# Patient Record
Sex: Female | Born: 1935 | Race: Black or African American | Hispanic: No | Smoking: Never smoker
Health system: Southern US, Community
[De-identification: ages and names within clinical notes are randomized; demographics above are authoritative.]

## PROBLEM LIST (undated history)

## (undated) DIAGNOSIS — R42 Dizziness and giddiness: Secondary | ICD-10-CM

## (undated) DIAGNOSIS — K635 Polyp of colon: Secondary | ICD-10-CM

## (undated) DIAGNOSIS — K219 Gastro-esophageal reflux disease without esophagitis: Secondary | ICD-10-CM

## (undated) DIAGNOSIS — I499 Cardiac arrhythmia, unspecified: Secondary | ICD-10-CM

## (undated) DIAGNOSIS — D509 Iron deficiency anemia, unspecified: Secondary | ICD-10-CM

## (undated) DIAGNOSIS — I639 Cerebral infarction, unspecified: Secondary | ICD-10-CM

## (undated) DIAGNOSIS — D519 Vitamin B12 deficiency anemia, unspecified: Secondary | ICD-10-CM

## (undated) DIAGNOSIS — E119 Type 2 diabetes mellitus without complications: Secondary | ICD-10-CM

## (undated) DIAGNOSIS — M5137 Other intervertebral disc degeneration, lumbosacral region: Secondary | ICD-10-CM

## (undated) DIAGNOSIS — E785 Hyperlipidemia, unspecified: Secondary | ICD-10-CM

## (undated) DIAGNOSIS — IMO0002 Reserved for concepts with insufficient information to code with codable children: Secondary | ICD-10-CM

## (undated) DIAGNOSIS — M51379 Other intervertebral disc degeneration, lumbosacral region without mention of lumbar back pain or lower extremity pain: Secondary | ICD-10-CM

## (undated) DIAGNOSIS — D510 Vitamin B12 deficiency anemia due to intrinsic factor deficiency: Secondary | ICD-10-CM

## (undated) DIAGNOSIS — R569 Unspecified convulsions: Secondary | ICD-10-CM

## (undated) DIAGNOSIS — R609 Edema, unspecified: Secondary | ICD-10-CM

## (undated) DIAGNOSIS — I10 Essential (primary) hypertension: Secondary | ICD-10-CM

## (undated) DIAGNOSIS — J309 Allergic rhinitis, unspecified: Secondary | ICD-10-CM

## (undated) DIAGNOSIS — N951 Menopausal and female climacteric states: Secondary | ICD-10-CM

## (undated) DIAGNOSIS — D126 Benign neoplasm of colon, unspecified: Secondary | ICD-10-CM

## (undated) DIAGNOSIS — M545 Low back pain, unspecified: Secondary | ICD-10-CM

## (undated) DIAGNOSIS — I2510 Atherosclerotic heart disease of native coronary artery without angina pectoris: Secondary | ICD-10-CM

## (undated) DIAGNOSIS — M1990 Unspecified osteoarthritis, unspecified site: Secondary | ICD-10-CM

## (undated) HISTORY — DX: Iron deficiency anemia, unspecified: D50.9

## (undated) HISTORY — PX: CATARACT EXTRACTION, BILATERAL: SHX1313

## (undated) HISTORY — DX: Menopausal and female climacteric states: N95.1

## (undated) HISTORY — DX: Other intervertebral disc degeneration, lumbosacral region: M51.37

## (undated) HISTORY — PX: CHOLECYSTECTOMY: SHX55

## (undated) HISTORY — DX: Edema, unspecified: R60.9

## (undated) HISTORY — DX: Dizziness and giddiness: R42

## (undated) HISTORY — PX: CARDIAC CATHETERIZATION: SHX172

## (undated) HISTORY — DX: Polyp of colon: K63.5

## (undated) HISTORY — DX: Reserved for concepts with insufficient information to code with codable children: IMO0002

## (undated) HISTORY — PX: CORONARY ANGIOPLASTY WITH STENT PLACEMENT: SHX49

## (undated) HISTORY — DX: Low back pain: M54.5

## (undated) HISTORY — DX: Benign neoplasm of colon, unspecified: D12.6

## (undated) HISTORY — DX: Vitamin B12 deficiency anemia, unspecified: D51.9

## (undated) HISTORY — DX: Low back pain, unspecified: M54.50

## (undated) HISTORY — DX: Cardiac arrhythmia, unspecified: I49.9

## (undated) HISTORY — DX: Unspecified convulsions: R56.9

## (undated) HISTORY — DX: Allergic rhinitis, unspecified: J30.9

## (undated) HISTORY — DX: Vitamin B12 deficiency anemia due to intrinsic factor deficiency: D51.0

## (undated) HISTORY — PX: CARPAL TUNNEL RELEASE: SHX101

## (undated) HISTORY — DX: Other intervertebral disc degeneration, lumbosacral region without mention of lumbar back pain or lower extremity pain: M51.379

## (undated) HISTORY — DX: Atherosclerotic heart disease of native coronary artery without angina pectoris: I25.10

## (undated) HISTORY — PX: LUMBAR DISC SURGERY: SHX700

## (undated) HISTORY — DX: Gastro-esophageal reflux disease without esophagitis: K21.9

## (undated) HISTORY — DX: Essential (primary) hypertension: I10

## (undated) HISTORY — DX: Hyperlipidemia, unspecified: E78.5

## (undated) HISTORY — DX: Cerebral infarction, unspecified: I63.9

---

## 1972-11-11 HISTORY — PX: ABDOMINAL HYSTERECTOMY: SHX81

## 1972-11-11 HISTORY — PX: APPENDECTOMY: SHX54

## 1998-02-17 ENCOUNTER — Ambulatory Visit (HOSPITAL_COMMUNITY): Admission: RE | Admit: 1998-02-17 | Discharge: 1998-02-17 | Payer: Self-pay | Admitting: Family Medicine

## 1998-04-12 ENCOUNTER — Observation Stay (HOSPITAL_COMMUNITY): Admission: EM | Admit: 1998-04-12 | Discharge: 1998-04-12 | Payer: Self-pay | Admitting: Emergency Medicine

## 1998-04-27 ENCOUNTER — Ambulatory Visit (HOSPITAL_COMMUNITY): Admission: RE | Admit: 1998-04-27 | Discharge: 1998-04-27 | Payer: Self-pay | Admitting: Family Medicine

## 1998-06-02 ENCOUNTER — Ambulatory Visit (HOSPITAL_COMMUNITY): Admission: RE | Admit: 1998-06-02 | Discharge: 1998-06-02 | Payer: Self-pay | Admitting: Internal Medicine

## 1998-12-20 ENCOUNTER — Other Ambulatory Visit: Admission: RE | Admit: 1998-12-20 | Discharge: 1998-12-20 | Payer: Self-pay | Admitting: Gynecology

## 1999-12-18 ENCOUNTER — Encounter: Payer: Self-pay | Admitting: Gynecology

## 1999-12-18 ENCOUNTER — Encounter: Admission: RE | Admit: 1999-12-18 | Discharge: 1999-12-18 | Payer: Self-pay | Admitting: Gynecology

## 2000-01-11 ENCOUNTER — Other Ambulatory Visit: Admission: RE | Admit: 2000-01-11 | Discharge: 2000-01-11 | Payer: Self-pay | Admitting: Gynecology

## 2000-04-10 ENCOUNTER — Emergency Department (HOSPITAL_COMMUNITY): Admission: EM | Admit: 2000-04-10 | Discharge: 2000-04-10 | Payer: Self-pay | Admitting: Emergency Medicine

## 2001-01-07 ENCOUNTER — Encounter: Admission: RE | Admit: 2001-01-07 | Discharge: 2001-01-07 | Payer: Self-pay | Admitting: Gynecology

## 2001-01-07 ENCOUNTER — Encounter: Payer: Self-pay | Admitting: Gynecology

## 2001-01-22 ENCOUNTER — Other Ambulatory Visit: Admission: RE | Admit: 2001-01-22 | Discharge: 2001-01-22 | Payer: Self-pay | Admitting: Gynecology

## 2001-02-03 ENCOUNTER — Encounter: Admission: RE | Admit: 2001-02-03 | Discharge: 2001-02-03 | Payer: Self-pay | Admitting: Gynecology

## 2001-02-03 ENCOUNTER — Encounter: Payer: Self-pay | Admitting: Gynecology

## 2001-04-30 ENCOUNTER — Encounter: Admission: RE | Admit: 2001-04-30 | Discharge: 2001-04-30 | Payer: Self-pay | Admitting: Family Medicine

## 2001-04-30 ENCOUNTER — Encounter: Payer: Self-pay | Admitting: Family Medicine

## 2001-05-06 ENCOUNTER — Encounter: Payer: Self-pay | Admitting: Family Medicine

## 2001-05-06 ENCOUNTER — Encounter: Admission: RE | Admit: 2001-05-06 | Discharge: 2001-05-06 | Payer: Self-pay | Admitting: Family Medicine

## 2001-06-25 ENCOUNTER — Encounter: Payer: Self-pay | Admitting: Emergency Medicine

## 2001-06-25 ENCOUNTER — Inpatient Hospital Stay (HOSPITAL_COMMUNITY): Admission: EM | Admit: 2001-06-25 | Discharge: 2001-06-26 | Payer: Self-pay | Admitting: Emergency Medicine

## 2001-06-26 ENCOUNTER — Encounter: Payer: Self-pay | Admitting: Emergency Medicine

## 2001-07-30 ENCOUNTER — Encounter (INDEPENDENT_AMBULATORY_CARE_PROVIDER_SITE_OTHER): Payer: Self-pay | Admitting: Specialist

## 2001-07-30 ENCOUNTER — Other Ambulatory Visit: Admission: RE | Admit: 2001-07-30 | Discharge: 2001-07-30 | Payer: Self-pay | Admitting: Internal Medicine

## 2002-01-25 ENCOUNTER — Other Ambulatory Visit: Admission: RE | Admit: 2002-01-25 | Discharge: 2002-01-25 | Payer: Self-pay | Admitting: Gynecology

## 2002-02-04 ENCOUNTER — Encounter: Payer: Self-pay | Admitting: Gynecology

## 2002-02-04 ENCOUNTER — Encounter: Admission: RE | Admit: 2002-02-04 | Discharge: 2002-02-04 | Payer: Self-pay | Admitting: Gynecology

## 2002-03-22 ENCOUNTER — Encounter: Admission: RE | Admit: 2002-03-22 | Discharge: 2002-03-22 | Payer: Self-pay | Admitting: Family Medicine

## 2002-03-22 ENCOUNTER — Encounter: Payer: Self-pay | Admitting: Family Medicine

## 2002-04-15 ENCOUNTER — Ambulatory Visit (HOSPITAL_COMMUNITY): Admission: RE | Admit: 2002-04-15 | Discharge: 2002-04-15 | Payer: Self-pay | Admitting: Gastroenterology

## 2002-05-24 ENCOUNTER — Encounter (INDEPENDENT_AMBULATORY_CARE_PROVIDER_SITE_OTHER): Payer: Self-pay | Admitting: Specialist

## 2002-05-24 ENCOUNTER — Encounter: Payer: Self-pay | Admitting: Surgery

## 2002-05-24 ENCOUNTER — Observation Stay (HOSPITAL_COMMUNITY): Admission: RE | Admit: 2002-05-24 | Discharge: 2002-05-25 | Payer: Self-pay | Admitting: Surgery

## 2002-06-01 ENCOUNTER — Ambulatory Visit (HOSPITAL_COMMUNITY): Admission: RE | Admit: 2002-06-01 | Discharge: 2002-06-01 | Payer: Self-pay | Admitting: Surgery

## 2002-06-01 ENCOUNTER — Encounter: Payer: Self-pay | Admitting: Surgery

## 2003-02-08 ENCOUNTER — Encounter: Payer: Self-pay | Admitting: Gynecology

## 2003-02-08 ENCOUNTER — Encounter: Admission: RE | Admit: 2003-02-08 | Discharge: 2003-02-08 | Payer: Self-pay | Admitting: Gynecology

## 2003-03-07 ENCOUNTER — Encounter: Admission: RE | Admit: 2003-03-07 | Discharge: 2003-03-07 | Payer: Self-pay | Admitting: Family Medicine

## 2003-03-07 ENCOUNTER — Encounter: Payer: Self-pay | Admitting: Family Medicine

## 2003-07-28 ENCOUNTER — Encounter: Payer: Self-pay | Admitting: Surgery

## 2003-07-28 ENCOUNTER — Encounter: Admission: RE | Admit: 2003-07-28 | Discharge: 2003-07-28 | Payer: Self-pay | Admitting: Surgery

## 2003-08-19 ENCOUNTER — Encounter: Payer: Self-pay | Admitting: Surgery

## 2003-08-19 ENCOUNTER — Encounter: Admission: RE | Admit: 2003-08-19 | Discharge: 2003-08-19 | Payer: Self-pay | Admitting: Surgery

## 2003-09-09 ENCOUNTER — Ambulatory Visit (HOSPITAL_COMMUNITY): Admission: RE | Admit: 2003-09-09 | Discharge: 2003-09-09 | Payer: Self-pay | Admitting: Surgery

## 2004-01-03 ENCOUNTER — Emergency Department (HOSPITAL_COMMUNITY): Admission: EM | Admit: 2004-01-03 | Discharge: 2004-01-03 | Payer: Self-pay | Admitting: Family Medicine

## 2004-01-08 ENCOUNTER — Emergency Department (HOSPITAL_COMMUNITY): Admission: AD | Admit: 2004-01-08 | Discharge: 2004-01-08 | Payer: Self-pay | Admitting: Family Medicine

## 2004-01-30 ENCOUNTER — Other Ambulatory Visit: Admission: RE | Admit: 2004-01-30 | Discharge: 2004-01-30 | Payer: Self-pay | Admitting: Gynecology

## 2004-02-16 ENCOUNTER — Encounter: Admission: RE | Admit: 2004-02-16 | Discharge: 2004-02-16 | Payer: Self-pay | Admitting: Gynecology

## 2004-08-27 ENCOUNTER — Ambulatory Visit (HOSPITAL_COMMUNITY): Admission: RE | Admit: 2004-08-27 | Discharge: 2004-08-27 | Payer: Self-pay | Admitting: Endocrinology

## 2004-09-20 ENCOUNTER — Ambulatory Visit: Payer: Self-pay | Admitting: Internal Medicine

## 2004-09-25 ENCOUNTER — Ambulatory Visit: Payer: Self-pay | Admitting: Cardiology

## 2004-10-18 ENCOUNTER — Ambulatory Visit: Payer: Self-pay | Admitting: Internal Medicine

## 2004-10-31 ENCOUNTER — Ambulatory Visit: Payer: Self-pay | Admitting: Endocrinology

## 2004-12-26 ENCOUNTER — Ambulatory Visit: Payer: Self-pay | Admitting: Internal Medicine

## 2005-02-01 ENCOUNTER — Emergency Department (HOSPITAL_COMMUNITY): Admission: EM | Admit: 2005-02-01 | Discharge: 2005-02-01 | Payer: Self-pay | Admitting: Family Medicine

## 2005-02-05 ENCOUNTER — Ambulatory Visit: Payer: Self-pay | Admitting: Internal Medicine

## 2005-02-25 ENCOUNTER — Encounter: Admission: RE | Admit: 2005-02-25 | Discharge: 2005-02-25 | Payer: Self-pay | Admitting: Gynecology

## 2005-05-02 ENCOUNTER — Ambulatory Visit: Payer: Self-pay | Admitting: Internal Medicine

## 2005-05-07 ENCOUNTER — Ambulatory Visit: Payer: Self-pay | Admitting: Endocrinology

## 2005-06-27 ENCOUNTER — Ambulatory Visit: Payer: Self-pay | Admitting: Endocrinology

## 2005-09-03 ENCOUNTER — Ambulatory Visit: Payer: Self-pay | Admitting: Endocrinology

## 2005-09-16 ENCOUNTER — Ambulatory Visit: Payer: Self-pay | Admitting: Internal Medicine

## 2005-09-24 ENCOUNTER — Ambulatory Visit: Payer: Self-pay | Admitting: Endocrinology

## 2005-09-26 ENCOUNTER — Ambulatory Visit: Payer: Self-pay | Admitting: Cardiology

## 2005-10-31 ENCOUNTER — Ambulatory Visit: Payer: Self-pay | Admitting: Internal Medicine

## 2005-11-12 ENCOUNTER — Ambulatory Visit: Payer: Self-pay | Admitting: Endocrinology

## 2006-01-23 ENCOUNTER — Ambulatory Visit: Payer: Self-pay | Admitting: Internal Medicine

## 2006-02-26 ENCOUNTER — Encounter: Admission: RE | Admit: 2006-02-26 | Discharge: 2006-02-26 | Payer: Self-pay | Admitting: Gynecology

## 2006-03-26 ENCOUNTER — Encounter: Admission: RE | Admit: 2006-03-26 | Discharge: 2006-03-26 | Payer: Self-pay | Admitting: Gynecology

## 2006-04-10 ENCOUNTER — Other Ambulatory Visit: Admission: RE | Admit: 2006-04-10 | Discharge: 2006-04-10 | Payer: Self-pay | Admitting: Gynecology

## 2006-05-13 ENCOUNTER — Ambulatory Visit: Payer: Self-pay | Admitting: Internal Medicine

## 2006-05-21 ENCOUNTER — Ambulatory Visit: Payer: Self-pay | Admitting: Endocrinology

## 2006-06-24 ENCOUNTER — Ambulatory Visit: Payer: Self-pay | Admitting: Endocrinology

## 2006-07-03 ENCOUNTER — Ambulatory Visit: Payer: Self-pay | Admitting: Endocrinology

## 2006-07-15 ENCOUNTER — Ambulatory Visit: Payer: Self-pay | Admitting: Endocrinology

## 2006-09-04 ENCOUNTER — Ambulatory Visit: Payer: Self-pay | Admitting: Endocrinology

## 2006-09-04 LAB — CONVERTED CEMR LAB
Chol/HDL Ratio, serum: 3.9
Cholesterol: 221 mg/dL (ref 0–200)
Creatinine,U: 120.7 mg/dL
HCT: 33.9 % — ABNORMAL LOW (ref 36.0–46.0)
HDL: 56.3 mg/dL (ref >39.0)
Hemoglobin: 11.3 g/dL — ABNORMAL LOW (ref 12.0–15.0)
Hgb A1c MFr Bld: 7.1 % — ABNORMAL HIGH (ref 4.6–6.0)
Iron: 76 ug/dL (ref 42–145)
LDL DIRECT: 139.5 mg/dL
MCHC: 33.3 g/dL (ref 30.0–36.0)
MCV: 92.6 fL (ref 78.0–100.0)
Microalb Creat Ratio: 33.1 mg/g — ABNORMAL HIGH (ref 0.0–30.0)
Microalb, Ur: 4 mg/dL — ABNORMAL HIGH (ref 0.0–1.9)
Platelets: 255 10*3/uL (ref 150–400)
RBC: 3.66 M/uL — ABNORMAL LOW (ref 3.87–5.11)
RDW: 13.7 % (ref 11.5–14.6)
Saturation Ratios: 25 % (ref 20.0–50.0)
Transferrin: 217.2 mg/dL (ref >=212.0)
Triglyceride fasting, serum: 80 mg/dL (ref 0–149)
VLDL: 16 mg/dL (ref 0–40)
Vitamin B-12: 234 pg/mL (ref 211–911)
WBC: 4.8 10*3/uL (ref 4.5–10.5)

## 2006-09-09 ENCOUNTER — Ambulatory Visit: Payer: Self-pay | Admitting: Endocrinology

## 2006-09-11 ENCOUNTER — Ambulatory Visit: Payer: Self-pay | Admitting: Cardiology

## 2006-09-12 ENCOUNTER — Ambulatory Visit: Payer: Self-pay | Admitting: Endocrinology

## 2006-10-01 ENCOUNTER — Ambulatory Visit: Payer: Self-pay | Admitting: Internal Medicine

## 2006-10-08 ENCOUNTER — Ambulatory Visit: Payer: Self-pay | Admitting: Endocrinology

## 2006-10-08 ENCOUNTER — Ambulatory Visit: Payer: Self-pay | Admitting: Cardiovascular Disease

## 2006-10-16 ENCOUNTER — Ambulatory Visit: Payer: Self-pay | Admitting: Internal Medicine

## 2006-11-06 ENCOUNTER — Ambulatory Visit: Payer: Self-pay | Admitting: Endocrinology

## 2006-11-06 ENCOUNTER — Ambulatory Visit (HOSPITAL_COMMUNITY): Admission: RE | Admit: 2006-11-06 | Discharge: 2006-11-06 | Payer: Self-pay | Admitting: Endocrinology

## 2007-02-25 ENCOUNTER — Ambulatory Visit: Payer: Self-pay | Admitting: Endocrinology

## 2007-03-03 ENCOUNTER — Encounter: Admission: RE | Admit: 2007-03-03 | Discharge: 2007-03-03 | Payer: Self-pay | Admitting: Gynecology

## 2007-03-31 ENCOUNTER — Ambulatory Visit: Payer: Self-pay | Admitting: Endocrinology

## 2007-03-31 LAB — CONVERTED CEMR LAB: Hgb A1c MFr Bld: 7.5 % — ABNORMAL HIGH (ref 4.6–6.0)

## 2007-04-23 ENCOUNTER — Encounter: Payer: Self-pay | Admitting: Endocrinology

## 2007-04-23 DIAGNOSIS — Z8719 Personal history of other diseases of the digestive system: Secondary | ICD-10-CM | POA: Insufficient documentation

## 2007-04-23 DIAGNOSIS — M129 Arthropathy, unspecified: Secondary | ICD-10-CM | POA: Insufficient documentation

## 2007-04-23 DIAGNOSIS — E119 Type 2 diabetes mellitus without complications: Secondary | ICD-10-CM | POA: Insufficient documentation

## 2007-04-23 DIAGNOSIS — M5137 Other intervertebral disc degeneration, lumbosacral region: Secondary | ICD-10-CM | POA: Insufficient documentation

## 2007-04-23 DIAGNOSIS — K5900 Constipation, unspecified: Secondary | ICD-10-CM | POA: Insufficient documentation

## 2007-04-23 DIAGNOSIS — D126 Benign neoplasm of colon, unspecified: Secondary | ICD-10-CM | POA: Insufficient documentation

## 2007-04-23 DIAGNOSIS — J309 Allergic rhinitis, unspecified: Secondary | ICD-10-CM | POA: Insufficient documentation

## 2007-04-23 DIAGNOSIS — K829 Disease of gallbladder, unspecified: Secondary | ICD-10-CM | POA: Insufficient documentation

## 2007-04-23 DIAGNOSIS — E785 Hyperlipidemia, unspecified: Secondary | ICD-10-CM | POA: Insufficient documentation

## 2007-04-23 DIAGNOSIS — I10 Essential (primary) hypertension: Secondary | ICD-10-CM | POA: Insufficient documentation

## 2007-06-29 LAB — CONVERTED CEMR LAB: Pap Smear: NORMAL

## 2007-08-20 ENCOUNTER — Ambulatory Visit: Payer: Self-pay | Admitting: Endocrinology

## 2007-09-18 ENCOUNTER — Ambulatory Visit: Payer: Self-pay | Admitting: Cardiology

## 2007-10-07 ENCOUNTER — Ambulatory Visit: Payer: Self-pay | Admitting: Endocrinology

## 2007-10-07 DIAGNOSIS — M545 Low back pain, unspecified: Secondary | ICD-10-CM | POA: Insufficient documentation

## 2007-11-06 ENCOUNTER — Ambulatory Visit: Payer: Self-pay | Admitting: Endocrinology

## 2007-11-06 DIAGNOSIS — N951 Menopausal and female climacteric states: Secondary | ICD-10-CM | POA: Insufficient documentation

## 2007-11-07 LAB — CONVERTED CEMR LAB
BUN: 22 mg/dL (ref 6–23)
CO2: 26 meq/L (ref 19–32)
Calcium: 9.6 mg/dL (ref 8.4–10.5)
Chloride: 105 meq/L (ref 96–112)
Creatinine, Ser: 1.1 mg/dL (ref 0.4–1.2)
GFR calc Af Amer: 61 mL/min
GFR calc non Af Amer: 51 mL/min
Glucose, Bld: 120 mg/dL — ABNORMAL HIGH (ref 70–99)
Hgb A1c MFr Bld: 7 % — ABNORMAL HIGH (ref 4.6–6.0)
Potassium: 4.1 meq/L (ref 3.5–5.1)
Sodium: 139 meq/L (ref 135–145)

## 2007-11-09 ENCOUNTER — Encounter: Payer: Self-pay | Admitting: Endocrinology

## 2007-11-09 ENCOUNTER — Ambulatory Visit: Payer: Self-pay | Admitting: Internal Medicine

## 2008-01-28 ENCOUNTER — Ambulatory Visit: Payer: Self-pay

## 2008-01-28 ENCOUNTER — Encounter: Payer: Self-pay | Admitting: Internal Medicine

## 2008-01-28 ENCOUNTER — Ambulatory Visit: Payer: Self-pay | Admitting: Endocrinology

## 2008-01-28 DIAGNOSIS — R609 Edema, unspecified: Secondary | ICD-10-CM | POA: Insufficient documentation

## 2008-01-29 LAB — CONVERTED CEMR LAB: Hgb A1c MFr Bld: 6.8 % — ABNORMAL HIGH (ref 4.6–6.0)

## 2008-02-11 ENCOUNTER — Ambulatory Visit: Payer: Self-pay | Admitting: Endocrinology

## 2008-02-11 DIAGNOSIS — M25569 Pain in unspecified knee: Secondary | ICD-10-CM | POA: Insufficient documentation

## 2008-02-15 LAB — CONVERTED CEMR LAB
Sed Rate: 45 mm/hr — ABNORMAL HIGH (ref 0–22)
Uric Acid, Serum: 6.5 mg/dL (ref 2.4–7.0)

## 2008-04-06 ENCOUNTER — Encounter: Admission: RE | Admit: 2008-04-06 | Discharge: 2008-04-06 | Payer: Self-pay | Admitting: Gynecology

## 2008-04-21 ENCOUNTER — Encounter: Payer: Self-pay | Admitting: Endocrinology

## 2008-06-04 ENCOUNTER — Emergency Department (HOSPITAL_COMMUNITY): Admission: EM | Admit: 2008-06-04 | Discharge: 2008-06-04 | Payer: Self-pay | Admitting: Emergency Medicine

## 2008-08-11 ENCOUNTER — Ambulatory Visit: Payer: Self-pay | Admitting: Endocrinology

## 2008-08-11 DIAGNOSIS — G8929 Other chronic pain: Secondary | ICD-10-CM

## 2008-08-11 DIAGNOSIS — M545 Low back pain, unspecified: Secondary | ICD-10-CM | POA: Insufficient documentation

## 2008-08-15 LAB — CONVERTED CEMR LAB
BUN: 25 mg/dL — ABNORMAL HIGH (ref 6–23)
CO2: 24 meq/L (ref 19–32)
Calcium: 9.4 mg/dL (ref 8.4–10.5)
Chloride: 107 meq/L (ref 96–112)
Cholesterol: 235 mg/dL (ref 0–200)
Creatinine, Ser: 1.1 mg/dL (ref 0.4–1.2)
Direct LDL: 155.3 mg/dL
GFR calc Af Amer: 61 mL/min
GFR calc non Af Amer: 51 mL/min
Glucose, Bld: 105 mg/dL — ABNORMAL HIGH (ref 70–99)
HDL: 61.3 mg/dL (ref >39.0)
Hgb A1c MFr Bld: 7.2 % — ABNORMAL HIGH (ref 4.6–6.0)
Potassium: 4 meq/L (ref 3.5–5.1)
Sodium: 140 meq/L (ref 135–145)
TSH: 2.74 microintl units/mL (ref 0.35–5.50)
Total CHOL/HDL Ratio: 3.8
Triglycerides: 132 mg/dL (ref 0–149)
VLDL: 26 mg/dL (ref 0–40)

## 2008-08-18 ENCOUNTER — Ambulatory Visit: Payer: Self-pay | Admitting: Endocrinology

## 2008-09-02 ENCOUNTER — Ambulatory Visit: Payer: Self-pay | Admitting: Cardiology

## 2008-11-10 ENCOUNTER — Ambulatory Visit: Payer: Self-pay | Admitting: Endocrinology

## 2008-11-10 LAB — CONVERTED CEMR LAB
ALT: 13 units/L (ref 0–35)
AST: 20 units/L (ref 0–37)
Albumin: 3.8 g/dL (ref 3.5–5.2)
Alkaline Phosphatase: 58 units/L (ref 39–117)
BUN: 17 mg/dL (ref 6–23)
Bilirubin, Direct: 0.2 mg/dL (ref 0.0–0.3)
CO2: 27 meq/L (ref 19–32)
Calcium: 9.5 mg/dL (ref 8.4–10.5)
Chloride: 104 meq/L (ref 96–112)
Cholesterol: 138 mg/dL (ref 0–200)
Creatinine, Ser: 0.9 mg/dL (ref 0.4–1.2)
GFR calc Af Amer: 77 mL/min
GFR calc non Af Amer: 64 mL/min
Glucose, Bld: 130 mg/dL — ABNORMAL HIGH (ref 70–99)
HDL: 61.5 mg/dL (ref >39.0)
Hgb A1c MFr Bld: 6.9 % — ABNORMAL HIGH (ref 4.6–6.0)
LDL Cholesterol: 63 mg/dL (ref 0–99)
Potassium: 4.2 meq/L (ref 3.5–5.1)
Sodium: 138 meq/L (ref 135–145)
Total Bilirubin: 0.7 mg/dL (ref 0.3–1.2)
Total CHOL/HDL Ratio: 2.2
Total Protein: 6.7 g/dL (ref 6.0–8.3)
Triglycerides: 69 mg/dL (ref 0–149)
VLDL: 14 mg/dL (ref 0–40)

## 2008-11-16 ENCOUNTER — Ambulatory Visit: Payer: Self-pay | Admitting: Endocrinology

## 2008-11-16 DIAGNOSIS — N3000 Acute cystitis without hematuria: Secondary | ICD-10-CM | POA: Insufficient documentation

## 2008-11-16 LAB — CONVERTED CEMR LAB
Bilirubin Urine: NEGATIVE
Glucose, Urine, Semiquant: NEGATIVE
Ketones, urine, test strip: NEGATIVE
Nitrite: NEGATIVE
Protein, U semiquant: NEGATIVE
Specific Gravity, Urine: <1.005
Urobilinogen, UA: 0.2
pH: 5

## 2008-12-13 ENCOUNTER — Ambulatory Visit: Payer: Self-pay | Admitting: Endocrinology

## 2008-12-13 DIAGNOSIS — R05 Cough: Secondary | ICD-10-CM

## 2008-12-13 DIAGNOSIS — R059 Cough, unspecified: Secondary | ICD-10-CM | POA: Insufficient documentation

## 2008-12-15 ENCOUNTER — Telehealth (INDEPENDENT_AMBULATORY_CARE_PROVIDER_SITE_OTHER): Payer: Self-pay | Admitting: *Deleted

## 2008-12-19 ENCOUNTER — Ambulatory Visit: Payer: Self-pay | Admitting: Endocrinology

## 2008-12-19 ENCOUNTER — Ambulatory Visit: Payer: Self-pay

## 2008-12-19 DIAGNOSIS — M79609 Pain in unspecified limb: Secondary | ICD-10-CM | POA: Insufficient documentation

## 2008-12-26 ENCOUNTER — Telehealth: Payer: Self-pay | Admitting: Endocrinology

## 2009-01-24 ENCOUNTER — Ambulatory Visit: Payer: Self-pay | Admitting: Vascular Surgery

## 2009-02-07 DIAGNOSIS — R002 Palpitations: Secondary | ICD-10-CM | POA: Insufficient documentation

## 2009-02-22 ENCOUNTER — Ambulatory Visit: Payer: Self-pay | Admitting: Vascular Surgery

## 2009-03-04 ENCOUNTER — Ambulatory Visit: Payer: Self-pay | Admitting: Family Medicine

## 2009-04-07 ENCOUNTER — Encounter: Admission: RE | Admit: 2009-04-07 | Discharge: 2009-04-07 | Payer: Self-pay | Admitting: Gynecology

## 2009-04-12 ENCOUNTER — Telehealth: Payer: Self-pay | Admitting: Cardiology

## 2009-04-12 ENCOUNTER — Emergency Department (HOSPITAL_COMMUNITY): Admission: EM | Admit: 2009-04-12 | Discharge: 2009-04-12 | Payer: Self-pay | Admitting: Emergency Medicine

## 2009-04-26 ENCOUNTER — Encounter: Payer: Self-pay | Admitting: Endocrinology

## 2009-05-04 ENCOUNTER — Encounter: Payer: Self-pay | Admitting: Endocrinology

## 2009-05-17 ENCOUNTER — Ambulatory Visit: Payer: Self-pay | Admitting: Endocrinology

## 2009-05-17 DIAGNOSIS — D510 Vitamin B12 deficiency anemia due to intrinsic factor deficiency: Secondary | ICD-10-CM | POA: Insufficient documentation

## 2009-05-17 LAB — CONVERTED CEMR LAB
Basophils Absolute: 0 10*3/uL (ref 0.0–0.1)
Basophils Relative: 0.7 % (ref 0.0–3.0)
Eosinophils Absolute: 0.3 10*3/uL (ref 0.0–0.7)
Eosinophils Relative: 6.5 % — ABNORMAL HIGH (ref 0.0–5.0)
Folate: 10.2 ng/mL
HCT: 30.7 % — ABNORMAL LOW (ref 36.0–46.0)
Hemoglobin: 10.1 g/dL — ABNORMAL LOW (ref 12.0–15.0)
Hgb A1c MFr Bld: 6.6 % — ABNORMAL HIGH (ref 4.6–6.5)
Iron: 50 ug/dL (ref 42–145)
Lymphocytes Relative: 44.3 % (ref 12.0–46.0)
Lymphs Abs: 1.9 10*3/uL (ref 0.7–4.0)
MCHC: 32.9 g/dL (ref 30.0–36.0)
MCV: 91.2 fL (ref 78.0–100.0)
Monocytes Absolute: 0.5 10*3/uL (ref 0.1–1.0)
Monocytes Relative: 12.2 % — ABNORMAL HIGH (ref 3.0–12.0)
Neutro Abs: 1.5 10*3/uL (ref 1.4–7.7)
Neutrophils Relative %: 36.3 % — ABNORMAL LOW (ref 43.0–77.0)
Platelets: 207 10*3/uL (ref 150.0–400.0)
RBC: 3.36 M/uL — ABNORMAL LOW (ref 3.87–5.11)
RDW: 13.3 % (ref 11.5–14.6)
Saturation Ratios: 15.4 % — ABNORMAL LOW (ref 20.0–50.0)
Transferrin: 232.5 mg/dL (ref 212.0–360.0)
Vitamin B-12: 108 pg/mL — ABNORMAL LOW (ref 211–911)
WBC: 4.2 10*3/uL — ABNORMAL LOW (ref 4.5–10.5)

## 2009-05-25 ENCOUNTER — Encounter: Payer: Self-pay | Admitting: Endocrinology

## 2009-05-25 ENCOUNTER — Telehealth (INDEPENDENT_AMBULATORY_CARE_PROVIDER_SITE_OTHER): Payer: Self-pay | Admitting: *Deleted

## 2009-06-07 ENCOUNTER — Ambulatory Visit: Payer: Self-pay | Admitting: Endocrinology

## 2009-06-07 ENCOUNTER — Telehealth: Payer: Self-pay | Admitting: Endocrinology

## 2009-06-22 ENCOUNTER — Encounter (INDEPENDENT_AMBULATORY_CARE_PROVIDER_SITE_OTHER): Payer: Self-pay | Admitting: *Deleted

## 2009-07-05 ENCOUNTER — Ambulatory Visit: Payer: Self-pay | Admitting: Endocrinology

## 2009-08-08 ENCOUNTER — Ambulatory Visit: Payer: Self-pay | Admitting: Endocrinology

## 2009-08-21 ENCOUNTER — Ambulatory Visit: Payer: Self-pay | Admitting: Endocrinology

## 2009-08-30 ENCOUNTER — Ambulatory Visit: Payer: Self-pay | Admitting: Cardiology

## 2009-09-12 ENCOUNTER — Ambulatory Visit: Payer: Self-pay | Admitting: Endocrinology

## 2009-09-13 DIAGNOSIS — R102 Pelvic and perineal pain: Secondary | ICD-10-CM | POA: Insufficient documentation

## 2009-09-13 LAB — CONVERTED CEMR LAB
ALT: 15 units/L (ref 0–35)
AST: 26 units/L (ref 0–37)
Albumin: 4.1 g/dL (ref 3.5–5.2)
Alkaline Phosphatase: 57 units/L (ref 39–117)
Amylase: 153 units/L — ABNORMAL HIGH (ref 27–131)
BUN: 19 mg/dL (ref 6–23)
Basophils Absolute: 0 10*3/uL (ref 0.0–0.1)
Basophils Relative: 0.6 % (ref 0.0–3.0)
Bilirubin, Direct: 0.1 mg/dL (ref 0.0–0.3)
CO2: 23 meq/L (ref 19–32)
Calcium: 9.2 mg/dL (ref 8.4–10.5)
Chloride: 100 meq/L (ref 96–112)
Creatinine, Ser: 1.1 mg/dL (ref 0.4–1.2)
Eosinophils Absolute: 0.3 10*3/uL (ref 0.0–0.7)
Eosinophils Relative: 7.2 % — ABNORMAL HIGH (ref 0.0–5.0)
GFR calc non Af Amer: 61.04 mL/min (ref >60)
Glucose, Bld: 135 mg/dL — ABNORMAL HIGH (ref 70–99)
HCT: 29.9 % — ABNORMAL LOW (ref 36.0–46.0)
Hemoglobin: 10.3 g/dL — ABNORMAL LOW (ref 12.0–15.0)
Hgb A1c MFr Bld: 6.9 % — ABNORMAL HIGH (ref 4.6–6.5)
Lymphocytes Relative: 38.5 % (ref 12.0–46.0)
Lymphs Abs: 1.8 10*3/uL (ref 0.7–4.0)
MCHC: 34.5 g/dL (ref 30.0–36.0)
MCV: 91.9 fL (ref 78.0–100.0)
Monocytes Absolute: 0.5 10*3/uL (ref 0.1–1.0)
Monocytes Relative: 11 % (ref 3.0–12.0)
Neutro Abs: 2.2 10*3/uL (ref 1.4–7.7)
Neutrophils Relative %: 42.7 % — ABNORMAL LOW (ref 43.0–77.0)
Platelets: 213 10*3/uL (ref 150.0–400.0)
Potassium: 4.2 meq/L (ref 3.5–5.1)
RBC: 3.25 M/uL — ABNORMAL LOW (ref 3.87–5.11)
RDW: 13 % (ref 11.5–14.6)
Sodium: 137 meq/L (ref 135–145)
Total Bilirubin: 0.4 mg/dL (ref 0.3–1.2)
Total Protein: 6.8 g/dL (ref 6.0–8.3)
WBC: 4.8 10*3/uL (ref 4.5–10.5)

## 2009-09-14 ENCOUNTER — Ambulatory Visit: Payer: Self-pay | Admitting: Internal Medicine

## 2009-09-18 ENCOUNTER — Ambulatory Visit: Payer: Self-pay | Admitting: Endocrinology

## 2009-09-19 ENCOUNTER — Telehealth (INDEPENDENT_AMBULATORY_CARE_PROVIDER_SITE_OTHER): Payer: Self-pay | Admitting: *Deleted

## 2009-09-22 ENCOUNTER — Telehealth: Payer: Self-pay | Admitting: Endocrinology

## 2009-12-28 ENCOUNTER — Telehealth: Payer: Self-pay | Admitting: Endocrinology

## 2009-12-28 ENCOUNTER — Ambulatory Visit: Payer: Self-pay | Admitting: Endocrinology

## 2009-12-28 DIAGNOSIS — D509 Iron deficiency anemia, unspecified: Secondary | ICD-10-CM | POA: Insufficient documentation

## 2009-12-28 DIAGNOSIS — R42 Dizziness and giddiness: Secondary | ICD-10-CM | POA: Insufficient documentation

## 2009-12-28 LAB — CONVERTED CEMR LAB
BUN: 15 mg/dL (ref 6–23)
Basophils Absolute: 0 10*3/uL (ref 0.0–0.1)
Basophils Relative: 0.4 % (ref 0.0–3.0)
CO2: 29 meq/L (ref 19–32)
Calcium: 9.2 mg/dL (ref 8.4–10.5)
Chloride: 105 meq/L (ref 96–112)
Creatinine, Ser: 1 mg/dL (ref 0.4–1.2)
Eosinophils Absolute: 0.3 10*3/uL (ref 0.0–0.7)
Eosinophils Relative: 6.1 % — ABNORMAL HIGH (ref 0.0–5.0)
GFR calc non Af Amer: 68.08 mL/min (ref >60)
Glucose, Bld: 158 mg/dL — ABNORMAL HIGH (ref 70–99)
HCT: 31.6 % — ABNORMAL LOW (ref 36.0–46.0)
Hemoglobin: 10.2 g/dL — ABNORMAL LOW (ref 12.0–15.0)
Hgb A1c MFr Bld: 6.9 % — ABNORMAL HIGH (ref 4.6–6.5)
Iron: 29 ug/dL — ABNORMAL LOW (ref 42–145)
Lymphocytes Relative: 44.3 % (ref 12.0–46.0)
Lymphs Abs: 1.9 10*3/uL (ref 0.7–4.0)
MCHC: 32.4 g/dL (ref 30.0–36.0)
MCV: 93.2 fL (ref 78.0–100.0)
Monocytes Absolute: 0.5 10*3/uL (ref 0.1–1.0)
Monocytes Relative: 12.7 % — ABNORMAL HIGH (ref 3.0–12.0)
Neutro Abs: 1.6 10*3/uL (ref 1.4–7.7)
Neutrophils Relative %: 36.5 % — ABNORMAL LOW (ref 43.0–77.0)
Platelets: 210 10*3/uL (ref 150.0–400.0)
Potassium: 4.2 meq/L (ref 3.5–5.1)
RBC: 3.4 M/uL — ABNORMAL LOW (ref 3.87–5.11)
RDW: 13 % (ref 11.5–14.6)
Saturation Ratios: 8.5 % — ABNORMAL LOW (ref 20.0–50.0)
Sodium: 140 meq/L (ref 135–145)
Transferrin: 243.3 mg/dL (ref 212.0–360.0)
WBC: 4.3 10*3/uL — ABNORMAL LOW (ref 4.5–10.5)

## 2009-12-29 ENCOUNTER — Telehealth: Payer: Self-pay | Admitting: Internal Medicine

## 2010-01-01 ENCOUNTER — Ambulatory Visit: Payer: Self-pay | Admitting: Internal Medicine

## 2010-01-01 DIAGNOSIS — D518 Other vitamin B12 deficiency anemias: Secondary | ICD-10-CM | POA: Insufficient documentation

## 2010-01-01 DIAGNOSIS — Z860100 Personal history of colon polyps, unspecified: Secondary | ICD-10-CM | POA: Insufficient documentation

## 2010-01-01 DIAGNOSIS — M1990 Unspecified osteoarthritis, unspecified site: Secondary | ICD-10-CM | POA: Insufficient documentation

## 2010-01-10 ENCOUNTER — Ambulatory Visit: Payer: Self-pay | Admitting: Internal Medicine

## 2010-01-10 LAB — CONVERTED CEMR LAB
Basophils Absolute: 0 10*3/uL (ref 0.0–0.1)
Basophils Relative: 0.7 % (ref 0.0–3.0)
Eosinophils Absolute: 0.2 10*3/uL (ref 0.0–0.7)
Eosinophils Relative: 4.7 % (ref 0.0–5.0)
HCT: 31.2 % — ABNORMAL LOW (ref 36.0–46.0)
Hemoglobin: 10.1 g/dL — ABNORMAL LOW (ref 12.0–15.0)
Lymphocytes Relative: 32.4 % (ref 12.0–46.0)
Lymphs Abs: 1.1 10*3/uL (ref 0.7–4.0)
MCHC: 32.3 g/dL (ref 30.0–36.0)
MCV: 93.1 fL (ref 78.0–100.0)
Monocytes Absolute: 0.3 10*3/uL (ref 0.1–1.0)
Monocytes Relative: 9.6 % (ref 3.0–12.0)
Neutro Abs: 1.8 10*3/uL (ref 1.4–7.7)
Neutrophils Relative %: 52.6 % (ref 43.0–77.0)
Platelets: 195 10*3/uL (ref 150.0–400.0)
RBC: 3.35 M/uL — ABNORMAL LOW (ref 3.87–5.11)
RDW: 13.1 % (ref 11.5–14.6)
WBC: 3.4 10*3/uL — ABNORMAL LOW (ref 4.5–10.5)

## 2010-01-13 ENCOUNTER — Encounter: Payer: Self-pay | Admitting: Internal Medicine

## 2010-01-24 ENCOUNTER — Encounter: Payer: Self-pay | Admitting: Endocrinology

## 2010-01-24 ENCOUNTER — Ambulatory Visit: Payer: Self-pay | Admitting: Family Medicine

## 2010-01-31 ENCOUNTER — Ambulatory Visit: Payer: Self-pay | Admitting: Cardiology

## 2010-01-31 ENCOUNTER — Inpatient Hospital Stay (HOSPITAL_COMMUNITY): Admission: EM | Admit: 2010-01-31 | Discharge: 2010-02-03 | Payer: Self-pay | Admitting: Emergency Medicine

## 2010-02-01 ENCOUNTER — Encounter: Payer: Self-pay | Admitting: Cardiology

## 2010-02-02 ENCOUNTER — Encounter: Payer: Self-pay | Admitting: Internal Medicine

## 2010-02-13 ENCOUNTER — Encounter: Payer: Self-pay | Admitting: Cardiology

## 2010-02-14 DIAGNOSIS — K219 Gastro-esophageal reflux disease without esophagitis: Secondary | ICD-10-CM | POA: Insufficient documentation

## 2010-02-14 DIAGNOSIS — I4891 Unspecified atrial fibrillation: Secondary | ICD-10-CM | POA: Insufficient documentation

## 2010-02-14 DIAGNOSIS — I2510 Atherosclerotic heart disease of native coronary artery without angina pectoris: Secondary | ICD-10-CM | POA: Insufficient documentation

## 2010-02-16 ENCOUNTER — Ambulatory Visit: Payer: Self-pay | Admitting: Cardiology

## 2010-02-18 ENCOUNTER — Inpatient Hospital Stay (HOSPITAL_COMMUNITY): Admission: EM | Admit: 2010-02-18 | Discharge: 2010-02-20 | Payer: Self-pay | Admitting: Emergency Medicine

## 2010-02-18 ENCOUNTER — Ambulatory Visit: Payer: Self-pay | Admitting: Internal Medicine

## 2010-02-23 ENCOUNTER — Ambulatory Visit: Payer: Self-pay | Admitting: Internal Medicine

## 2010-02-26 LAB — CONVERTED CEMR LAB
Basophils Absolute: 0 10*3/uL (ref 0.0–0.1)
Basophils Relative: 0.6 % (ref 0.0–3.0)
Eosinophils Absolute: 0.3 10*3/uL (ref 0.0–0.7)
Eosinophils Relative: 6.5 % — ABNORMAL HIGH (ref 0.0–5.0)
HCT: 27.1 % — ABNORMAL LOW (ref 36.0–46.0)
Hemoglobin: 9.2 g/dL — ABNORMAL LOW (ref 12.0–15.0)
Lymphocytes Relative: 33.3 % (ref 12.0–46.0)
Lymphs Abs: 1.3 10*3/uL (ref 0.7–4.0)
MCHC: 34.1 g/dL (ref 30.0–36.0)
MCV: 91 fL (ref 78.0–100.0)
Monocytes Absolute: 0.5 10*3/uL (ref 0.1–1.0)
Monocytes Relative: 13.5 % — ABNORMAL HIGH (ref 3.0–12.0)
Neutro Abs: 1.9 10*3/uL (ref 1.4–7.7)
Neutrophils Relative %: 46.1 % (ref 43.0–77.0)
Platelets: 187 10*3/uL (ref 150.0–400.0)
RBC: 2.98 M/uL — ABNORMAL LOW (ref 3.87–5.11)
RDW: 14.9 % — ABNORMAL HIGH (ref 11.5–14.6)
WBC: 4.1 10*3/uL — ABNORMAL LOW (ref 4.5–10.5)

## 2010-03-06 ENCOUNTER — Ambulatory Visit: Payer: Self-pay | Admitting: Cardiology

## 2010-03-23 ENCOUNTER — Observation Stay (HOSPITAL_COMMUNITY): Admission: EM | Admit: 2010-03-23 | Discharge: 2010-03-25 | Payer: Self-pay | Admitting: Emergency Medicine

## 2010-03-24 ENCOUNTER — Encounter (INDEPENDENT_AMBULATORY_CARE_PROVIDER_SITE_OTHER): Payer: Self-pay | Admitting: Internal Medicine

## 2010-03-24 ENCOUNTER — Ambulatory Visit: Payer: Self-pay | Admitting: Surgery

## 2010-03-29 ENCOUNTER — Ambulatory Visit: Payer: Self-pay | Admitting: Endocrinology

## 2010-03-29 ENCOUNTER — Telehealth (INDEPENDENT_AMBULATORY_CARE_PROVIDER_SITE_OTHER): Payer: Self-pay | Admitting: *Deleted

## 2010-03-29 DIAGNOSIS — R55 Syncope and collapse: Secondary | ICD-10-CM | POA: Insufficient documentation

## 2010-03-29 LAB — CONVERTED CEMR LAB
Basophils Absolute: 0 10*3/uL (ref 0.0–0.1)
Basophils Relative: 0.4 % (ref 0.0–3.0)
Eosinophils Absolute: 0.3 10*3/uL (ref 0.0–0.7)
Eosinophils Relative: 5.4 % — ABNORMAL HIGH (ref 0.0–5.0)
HCT: 29.8 % — ABNORMAL LOW (ref 36.0–46.0)
Hemoglobin: 10.1 g/dL — ABNORMAL LOW (ref 12.0–15.0)
Hgb A1c MFr Bld: 6.5 % (ref 4.6–6.5)
Iron: 32 ug/dL — ABNORMAL LOW (ref 42–145)
Lymphocytes Relative: 38.3 % (ref 12.0–46.0)
Lymphs Abs: 1.8 10*3/uL (ref 0.7–4.0)
MCHC: 33.8 g/dL (ref 30.0–36.0)
MCV: 89.6 fL (ref 78.0–100.0)
Monocytes Absolute: 0.6 10*3/uL (ref 0.1–1.0)
Monocytes Relative: 12.4 % — ABNORMAL HIGH (ref 3.0–12.0)
Neutro Abs: 2.1 10*3/uL (ref 1.4–7.7)
Neutrophils Relative %: 43.5 % (ref 43.0–77.0)
Platelets: 232 10*3/uL (ref 150.0–400.0)
RBC: 3.32 M/uL — ABNORMAL LOW (ref 3.87–5.11)
RDW: 14.5 % (ref 11.5–14.6)
Saturation Ratios: 7.9 % — ABNORMAL LOW (ref 20.0–50.0)
Transferrin: 290.9 mg/dL (ref 212.0–360.0)
WBC: 4.7 10*3/uL (ref 4.5–10.5)

## 2010-04-05 ENCOUNTER — Ambulatory Visit: Payer: Self-pay | Admitting: Cardiology

## 2010-04-10 ENCOUNTER — Encounter: Admission: RE | Admit: 2010-04-10 | Discharge: 2010-04-10 | Payer: Self-pay | Admitting: Gynecology

## 2010-04-24 ENCOUNTER — Telehealth: Payer: Self-pay | Admitting: Cardiology

## 2010-05-02 ENCOUNTER — Encounter: Payer: Self-pay | Admitting: Cardiology

## 2010-05-03 ENCOUNTER — Ambulatory Visit: Payer: Self-pay | Admitting: Internal Medicine

## 2010-05-04 LAB — CONVERTED CEMR LAB
Basophils Absolute: 0 10*3/uL (ref 0.0–0.1)
Basophils Relative: 0.6 % (ref 0.0–3.0)
Eosinophils Absolute: 0.3 10*3/uL (ref 0.0–0.7)
Eosinophils Relative: 5.9 % — ABNORMAL HIGH (ref 0.0–5.0)
Ferritin: 17.4 ng/mL (ref 10.0–291.0)
HCT: 29.5 % — ABNORMAL LOW (ref 36.0–46.0)
Hemoglobin: 10.1 g/dL — ABNORMAL LOW (ref 12.0–15.0)
Iron: 66 ug/dL (ref 42–145)
Lymphocytes Relative: 40.9 % (ref 12.0–46.0)
Lymphs Abs: 2 10*3/uL (ref 0.7–4.0)
MCHC: 34.4 g/dL (ref 30.0–36.0)
MCV: 88.4 fL (ref 78.0–100.0)
Monocytes Absolute: 0.7 10*3/uL (ref 0.1–1.0)
Monocytes Relative: 14.3 % — ABNORMAL HIGH (ref 3.0–12.0)
Neutro Abs: 1.9 10*3/uL (ref 1.4–7.7)
Neutrophils Relative %: 38.3 % — ABNORMAL LOW (ref 43.0–77.0)
Pap Smear: NORMAL
Platelets: 260 10*3/uL (ref 150.0–400.0)
RBC: 3.34 M/uL — ABNORMAL LOW (ref 3.87–5.11)
RDW: 14.1 % (ref 11.5–14.6)
Saturation Ratios: 16.3 % — ABNORMAL LOW (ref 20.0–50.0)
Transferrin: 288.6 mg/dL (ref 212.0–360.0)
WBC: 4.9 10*3/uL (ref 4.5–10.5)

## 2010-05-08 ENCOUNTER — Encounter: Payer: Self-pay | Admitting: Endocrinology

## 2010-05-09 ENCOUNTER — Ambulatory Visit (HOSPITAL_COMMUNITY): Admission: RE | Admit: 2010-05-09 | Discharge: 2010-05-09 | Payer: Self-pay | Admitting: Internal Medicine

## 2010-06-08 ENCOUNTER — Ambulatory Visit: Payer: Self-pay | Admitting: Internal Medicine

## 2010-07-04 ENCOUNTER — Encounter: Payer: Self-pay | Admitting: Endocrinology

## 2010-08-23 ENCOUNTER — Ambulatory Visit: Payer: Self-pay | Admitting: Cardiology

## 2010-09-04 ENCOUNTER — Ambulatory Visit: Payer: Self-pay | Admitting: Endocrinology

## 2010-09-11 LAB — CONVERTED CEMR LAB
Basophils Absolute: 0 10*3/uL (ref 0.0–0.1)
Basophils Relative: 0.7 % (ref 0.0–3.0)
Eosinophils Absolute: 0.2 10*3/uL (ref 0.0–0.7)
Eosinophils Relative: 5.8 % — ABNORMAL HIGH (ref 0.0–5.0)
HCT: 32.4 % — ABNORMAL LOW (ref 36.0–46.0)
Hemoglobin: 11.1 g/dL — ABNORMAL LOW (ref 12.0–15.0)
Iron: 81 ug/dL (ref 42–145)
Lymphocytes Relative: 39.3 % (ref 12.0–46.0)
Lymphs Abs: 1.5 10*3/uL (ref 0.7–4.0)
MCHC: 34.1 g/dL (ref 30.0–36.0)
MCV: 90.2 fL (ref 78.0–100.0)
Monocytes Absolute: 0.6 10*3/uL (ref 0.1–1.0)
Monocytes Relative: 15 % — ABNORMAL HIGH (ref 3.0–12.0)
Neutro Abs: 1.5 10*3/uL (ref 1.4–7.7)
Neutrophils Relative %: 39.2 % — ABNORMAL LOW (ref 43.0–77.0)
Platelets: 186 10*3/uL (ref 150.0–400.0)
RBC: 3.6 M/uL — ABNORMAL LOW (ref 3.87–5.11)
RDW: 15.9 % — ABNORMAL HIGH (ref 11.5–14.6)
Saturation Ratios: 27.4 % (ref 20.0–50.0)
Transferrin: 210.8 mg/dL — ABNORMAL LOW (ref 212.0–360.0)
WBC: 3.9 10*3/uL — ABNORMAL LOW (ref 4.5–10.5)

## 2010-09-20 ENCOUNTER — Ambulatory Visit: Payer: Self-pay | Admitting: Internal Medicine

## 2010-10-09 ENCOUNTER — Ambulatory Visit: Payer: Self-pay | Admitting: Endocrinology

## 2010-10-09 DIAGNOSIS — E871 Hypo-osmolality and hyponatremia: Secondary | ICD-10-CM | POA: Insufficient documentation

## 2010-10-09 LAB — CONVERTED CEMR LAB
ALT: 14 units/L (ref 0–35)
AST: 23 units/L (ref 0–37)
Albumin: 4.3 g/dL (ref 3.5–5.2)
Alkaline Phosphatase: 63 units/L (ref 39–117)
BUN: 22 mg/dL (ref 6–23)
Basophils Absolute: 0 10*3/uL (ref 0.0–0.1)
Basophils Relative: 0.6 % (ref 0.0–3.0)
Bilirubin Urine: NEGATIVE
Bilirubin, Direct: 0.2 mg/dL (ref 0.0–0.3)
CO2: 25 meq/L (ref 19–32)
Calcium: 9.4 mg/dL (ref 8.4–10.5)
Chloride: 95 meq/L — ABNORMAL LOW (ref 96–112)
Cholesterol: 156 mg/dL (ref 0–200)
Creatinine, Ser: 0.9 mg/dL (ref 0.4–1.2)
Creatinine,U: 63.7 mg/dL
Eosinophils Absolute: 0.3 10*3/uL (ref 0.0–0.7)
Eosinophils Relative: 5.8 % — ABNORMAL HIGH (ref 0.0–5.0)
GFR calc non Af Amer: 76.74 mL/min (ref >60)
Glucose, Bld: 106 mg/dL — ABNORMAL HIGH (ref 70–99)
HCT: 33.6 % — ABNORMAL LOW (ref 36.0–46.0)
HDL: 61.8 mg/dL (ref >39.00)
Hemoglobin, Urine: NEGATIVE
Hemoglobin: 11.4 g/dL — ABNORMAL LOW (ref 12.0–15.0)
Hgb A1c MFr Bld: 6.6 % — ABNORMAL HIGH (ref 4.6–6.5)
Iron: 59 ug/dL (ref 42–145)
Ketones, ur: NEGATIVE mg/dL
LDL Cholesterol: 66 mg/dL (ref 0–99)
Lymphocytes Relative: 34.1 % (ref 12.0–46.0)
Lymphs Abs: 1.7 10*3/uL (ref 0.7–4.0)
MCHC: 34 g/dL (ref 30.0–36.0)
MCV: 94.1 fL (ref 78.0–100.0)
Microalb Creat Ratio: 0.8 mg/g (ref 0.0–30.0)
Microalb, Ur: 0.5 mg/dL (ref 0.0–1.9)
Monocytes Absolute: 0.7 10*3/uL (ref 0.1–1.0)
Monocytes Relative: 13.4 % — ABNORMAL HIGH (ref 3.0–12.0)
Neutro Abs: 2.3 10*3/uL (ref 1.4–7.7)
Neutrophils Relative %: 46.1 % (ref 43.0–77.0)
Nitrite: NEGATIVE
Platelets: 253 10*3/uL (ref 150.0–400.0)
Potassium: 4.8 meq/L (ref 3.5–5.1)
RBC: 3.57 M/uL — ABNORMAL LOW (ref 3.87–5.11)
RDW: 13.3 % (ref 11.5–14.6)
Saturation Ratios: 18.1 % — ABNORMAL LOW (ref 20.0–50.0)
Sodium: 128 meq/L — ABNORMAL LOW (ref 135–145)
Specific Gravity, Urine: 1.01 (ref 1.000–1.030)
TSH: 2.27 microintl units/mL (ref 0.35–5.50)
Total Bilirubin: 0.4 mg/dL (ref 0.3–1.2)
Total CHOL/HDL Ratio: 3
Total Protein, Urine: NEGATIVE mg/dL
Total Protein: 6.8 g/dL (ref 6.0–8.3)
Transferrin: 233.1 mg/dL (ref 212.0–360.0)
Triglycerides: 143 mg/dL (ref 0.0–149.0)
Urine Glucose: NEGATIVE mg/dL
Urobilinogen, UA: 0.2 (ref 0.0–1.0)
VLDL: 28.6 mg/dL (ref 0.0–40.0)
WBC: 5 10*3/uL (ref 4.5–10.5)
pH: 5 (ref 5.0–8.0)

## 2010-12-02 ENCOUNTER — Encounter: Payer: Self-pay | Admitting: Gynecology

## 2010-12-06 ENCOUNTER — Ambulatory Visit
Admission: RE | Admit: 2010-12-06 | Discharge: 2010-12-06 | Payer: Self-pay | Source: Home / Self Care | Attending: Endocrinology | Admitting: Endocrinology

## 2010-12-06 ENCOUNTER — Other Ambulatory Visit: Payer: Self-pay | Admitting: Endocrinology

## 2010-12-06 LAB — BASIC METABOLIC PANEL
BUN: 24 mg/dL — ABNORMAL HIGH (ref 6–23)
CO2: 26 mEq/L (ref 19–32)
Calcium: 9.4 mg/dL (ref 8.4–10.5)
Chloride: 98 mEq/L (ref 96–112)
Creatinine, Ser: 1 mg/dL (ref 0.4–1.2)
GFR: 65.65 mL/min (ref >60.00)
Glucose, Bld: 112 mg/dL — ABNORMAL HIGH (ref 70–99)
Potassium: 4.3 mEq/L (ref 3.5–5.1)
Sodium: 132 mEq/L — ABNORMAL LOW (ref 135–145)

## 2010-12-06 LAB — CBC WITH DIFFERENTIAL/PLATELET
Basophils Absolute: 0 10*3/uL (ref 0.0–0.1)
Basophils Relative: 0.5 % (ref 0.0–3.0)
Eosinophils Absolute: 0.2 10*3/uL (ref 0.0–0.7)
Eosinophils Relative: 4.3 % (ref 0.0–5.0)
HCT: 33.5 % — ABNORMAL LOW (ref 36.0–46.0)
Hemoglobin: 11.4 g/dL — ABNORMAL LOW (ref 12.0–15.0)
Lymphocytes Relative: 34 % (ref 12.0–46.0)
Lymphs Abs: 1.8 10*3/uL (ref 0.7–4.0)
MCHC: 34.2 g/dL (ref 30.0–36.0)
MCV: 93.9 fl (ref 78.0–100.0)
Monocytes Absolute: 0.6 10*3/uL (ref 0.1–1.0)
Monocytes Relative: 11.9 % (ref 3.0–12.0)
Neutro Abs: 2.6 10*3/uL (ref 1.4–7.7)
Neutrophils Relative %: 49.3 % (ref 43.0–77.0)
Platelets: 230 10*3/uL (ref 150.0–400.0)
RBC: 3.57 Mil/uL — ABNORMAL LOW (ref 3.87–5.11)
RDW: 13.6 % (ref 11.5–14.6)
WBC: 5.4 10*3/uL (ref 4.5–10.5)

## 2010-12-06 LAB — IBC PANEL
Iron: 47 ug/dL (ref 42–145)
Saturation Ratios: 15.4 % — ABNORMAL LOW (ref 20.0–50.0)
Transferrin: 218.1 mg/dL (ref 212.0–360.0)

## 2010-12-11 NOTE — Assessment & Plan Note (Signed)
Summary: PER PT FU--#---STC   Vital Signs:  Patient profile:   77 year old female Height:      62 inches (157.48 cm) Weight:      129.13 pounds (58.70 kg) O2 Sat:      97 % on Room air Temp:     97.6 degrees F (36.44 degrees C) oral Pulse rate:   66 / minute BP sitting:   140 / 78  (left arm) Cuff size:   regular  Vitals Entered By: Josph Macho CMA (August 21, 2009 10:13 AM)  O2 Flow:  Room air CC: follow-up visit/ pt states her left side gets swollen and feels like it "fills up with gas"/ pt wants flu vax today/ pt states she is not taking Isosorbide mononitrate, Patanol, Claritin, or Doxycycline/CF Is Patient Diabetic? Yes   CC:  follow-up visit/ pt states her left side gets swollen and feels like it "fills up with gas"/ pt wants flu vax today/ pt states she is not taking Isosorbide mononitrate, Patanol, Claritin, and or Doxycycline/CF.  History of Present Illness: see above (sxs x 1 week).  no n/v/brbpr pt says she reduced her metformin to 500 mg two times a day, due to diarrhea. since the reduction, it has resolved. no cbg record, but states cbg's are well-controlled. she does not take isosorbide.   Current Medications (verified): 1)  Toprol Xl 50 Mg Tb24 (Metoprolol Succinate) .... Take 1 Tablet By Mouth Once A Day 2)  Verapamil Hcl Cr 240 Mg Tbcr (Verapamil Hcl) .... Take 1 By Mouth Qd 3)  Glucophage Xr 500 Mg  Tb24 (Metformin Hcl) .... 2-Bid 4)  Zestoretic 20-12.5 Mg  Tabs (Lisinopril-Hydrochlorothiazide) .... Take 2 By Mouth Qd 5)  Ultracet 37.5-325 Mg  Tabs (Tramadol-Acetaminophen) .... Q4h As Needed Pain 6)  Isosorbide Mononitrate 10 Mg  Tabs (Isosorbide Mononitrate) .... Qd 7)  Pravachol 40 Mg Tabs (Pravastatin Sodium) .... 2 Qhs 8)  Methocarbamol 500 Mg Tabs (Methocarbamol) .[name].. 1 Qhs 9)  Patanol 0.1 % Soln (Olopatadine Hcl) .[name].. 1 Drop in Each Eye Two Times A Day 10)  Claritin 10 Mg Tabs (Loratadine) .[name].. 1 By Mouth Once Daily As Needed Allergies 11)   Doxycycline Hyclate 100 Mg Caps (Doxycycline Hyclate) .[name].. 1 Bid 12)  B12 .[name].. 1000 Micrograms Q Month  Allergies (verified): 1)  ! Sulfa 2)  ! Pcn 3)  ! Feldene 4)  ! Erythromycin 5)  ! Celebrex 6)  ! Macrobid 7)  ! * Decongestants  Past History:  Past Medical History: DYSLIPIDEMIA, rx limited by perceived intolerance to statins (ICD-272.4) ENCOUNTER FOR LONG-TERM USE OF OTHER MEDICATIONS (ICD-V58.69) PERNICIOUS ANEMIA (ICD-281.0) UNSPECIFIED ANEMIA (ICD-285.9) PALPITATIONS (ICD-785.1) LEG PAIN, BILATERAL (ICD-729.5) COUGH (ICD-786.2) ACUTE CYSTITIS (ICD-595.0) LUMBOSACRAL RADICULOPATHY (ICD-724.4) KNEE PAIN, RIGHT (ICD-719.46) EDEMA (ICD-782.3) CLIMACTERIC STATE, FEMALE (ICD-627.2) BACK PAIN, LUMBAR (ICD-724.2) GALLBLADDER DISEASE (ICD-575.9) DISC DISEASE, LUMBAR (ICD-722.52) CONSTIPATION NOS (ICD-564.00) ABDOMINAL PAIN, CHRONIC (ICD-789.00) ARTHRITIS (ICD-716.90) COLONIC POLYPS (ICD-211.3) ADENOCARCINOMA, COLON, FAMILY HX (ICD-V16.0) HYPERTENSION (ICD-401.9) DIVERTICULITIS, HX OF (ICD-V12.79) DIABETES MELLITUS, TYPE II (ICD-250.00) ALLERGIC RHINITIS (ICD-477.9)  Review of Systems  The patient denies headaches, weight loss, and weight gain.    Physical Exam  General:  normal appearance.   Abdomen:  abdomen is soft, nontender.  no hepatosplenomegaly.   not distended.  no hernia    Impression & Recommendations:  Problem # 1:  DIABETES MELLITUS, TYPE II (ICD-250.00) therapy limited by diarrhea  Problem # 2:  HYPERTENSION (ICD-401.9) with first-degree avb on ecg  Problem # 3:  dyspeptic sxs uncertain etiology  Other Orders: Flu Vaccine 89yrs + ([address]) Administration Flu vaccine - MCR (G0008) TLB-A1C / Hgb A1C (Glycohemoglobin) (83036-A1C) TLB-Microalbumin/Creat Ratio, Urine (82043-MALB) TLB-BMP (Basic Metabolic Panel-BMET) (80048-METABOL) TLB-Hepatic/Liver Function Pnl (80076-HEPATIC) TLB-Lipid Panel (80061-LIPID) TLB-TSH (Thyroid Stimulating  Hormone) (84443-TSH) TLB-CBC Platelet - w/Differential (85025-CBCD) TLB-IBC Pnl (Iron/FE;Transferrin) (83550-IBC) EKG w/ Interpretation (93000) Est. Patient Level IV (60454) Flu Vaccine Consent Questions     Do you have a history of severe allergic reactions to this vaccine? no    Any prior history of allergic reactions to egg and/or gelatin? no    Do you have a sensitivity to the preservative Thimersol? no    Do you have a past history of Guillan-Barre Syndrome? no    Do you currently have an acute febrile illness? no    Have you ever had a severe reaction to latex? no    Vaccine information given and explained to patient? yes    Are you currently pregnant? no    Lot Number:[id number]   Exp Date:05/10/2010   Site Given  Left Deltoid IM Administration Flu vaccine - MCR (U9811)  Patient Instructions: 1)  tests are being ordered for you today.  a few days after the test(s), please call [phone] to hear your test results.   2)  same medications for now 3)  trial of prilosec otc 20 mg daily. 4)  call 1 week if not better, to consider abdominal ultrasound 5)  return 6 mos 6)  also keep appt with dr wall next week .lbmedflu   Preventive Care Screening     gyn is dr Nicholas Lose, who does mammography

## 2010-12-11 NOTE — Miscellaneous (Signed)
Summary: BONE DENSITY  Clinical Lists Changes  Orders: Added new Test order of T-Bone Densitometry (77080) - Signed Added new Test order of T-Lumbar Vertebral Assessment (77082) - Signed 

## 2010-12-11 NOTE — Progress Notes (Signed)
  Phone Note Other Incoming   Caller: [name] (in xray) Summary of Call: [name] from [facility] called and states that pt's last bone density was in December of 2008, she said she would need to wait unitl dec of this year to have another bone density so her insurance will pay. Just and FYI Initial call taken by: Ami Bullins CMA,  May 25, 2009 9:45 AM  Follow-up for Phone Call        pleas cancel for now, thank you.   i added the 12/08 dexa to emr prevent care flow sheet. Follow-up by: Minus Breeding MD,  May 25, 2009 11:36 AM  Additional Follow-up for Phone Call Additional follow up Details #1::        done Additional Follow-up by: Ami Bullins CMA,  May 25, 2009 11:56 AM      Preventive Care Screening  Bone Density:    Date:  10/12/2007    Results:  normal

## 2010-12-11 NOTE — Assessment & Plan Note (Signed)
Summary: FOLLOW-UP ON LABS/LMB   Vital Signs:  Patient Profile:   77 Years Old Female Height:     62 inches Weight:      133.8 pounds Temp:     97.2 degrees F oral Pulse rate:   70 / minute BP sitting:   142 / 72  (left arm) Cuff size:   regular  Pt. in pain?   no  Vitals Entered By: Orlan Leavens (August 18, 2008 8:02 AM)                  Chief Complaint:  follow-up on labs.  History of Present Illness: pt says her pain is better.  she did not take the medrol. she is on no med for her dyslipidemia cbg's are well-controlled      Current Allergies: ! SULFA ! PCN ! FELDENE ! ERYTHROMYCIN ! CELEBREX ! MACROBID ! * DECONGESTANTS  Past Medical History:    Reviewed history from 08/11/2008 and no changes required:       BACK PAIN, LUMBAR (ICD-724.2)       GALLBLADDER DISEASE (ICD-575.9)       DISC DISEASE, LUMBAR (ICD-722.52)       CONSTIPATION NOS (ICD-564.00)       ABDOMINAL PAIN, CHRONIC (ICD-789.00)       DYSLIPIDEMIA, rx limited by perceived intolerance to statins (ICD-272.4)       ARTHRITIS (ICD-716.90)       COLONIC POLYPS (ICD-211.3)       ADENOCARCINOMA, COLON, FAMILY HX (ICD-V16.0)       HYPERTENSION (ICD-401.9)       DIVERTICULITIS, HX OF (ICD-V12.79)       DIABETES MELLITUS, TYPE II (ICD-250.00)       ALLERGIC RHINITIS (ICD-477.9)   Family History:    Reviewed history and no changes required:       sister had breast and colon cancer       daughter has breast cancer  Social History:    Reviewed history and no changes required:       retired       widowed    Review of Systems  The patient denies weight loss and weight gain.     Physical Exam  General:     well developed, well nourished, in no acute distress Extremities:     no edema    Impression & Recommendations:  Problem # 1:  LUMBOSACRAL RADICULOPATHY (ICD-724.4) Assessment: Improved  Problem # 2:  HYPERTENSION (ICD-401.9)  Problem # 3:  DYSLIPIDEMIA  (ICD-272.4)  Medications Added to Medication List This Visit: 1)  Glucophage Xr 500 Mg Tb24 (Metformin hcl) .... 2-bid 2)  Pravachol 40 Mg Tabs (Pravastatin sodium) .... 2 qhs   Patient Instructions: 1)  pravachol 80 mg at night 2)  increase metformin to 1000-bid 3)  ret 3 mos with a1c bmet and lipids 250.00 and liver v58.69   Prescriptions: GLUCOPHAGE XR 500 MG  TB24 (METFORMIN HCL) 2-bid  #120 x 11   Entered and Authorized by:   Minus Breeding MD   Signed by:   Minus Breeding MD on 08/18/2008   Method used:   Electronically to        [facility] (retail)       [address]       [place], [place]  [address]       Ph: [phone]       Fax: [phone]  RxID:   [id number] PRAVACHOL 40 MG TABS (PRAVASTATIN SODIUM) 2 qhs  #60 x 11   Entered and Authorized by:   Minus Breeding MD   Signed by:   Minus Breeding MD on 08/18/2008   Method used:   Electronically to        [facility] (retail)       [address]       [place], [place]  [address]       Ph: [phone]       Fax: [phone]   RxID:   [id number]  ]

## 2010-12-11 NOTE — Miscellaneous (Signed)
Summary: [facility]  [facility]   Imported By: Roderic Ovens 05/10/2010 16:27:19  _____________________________________________________________________  External Attachment:    Type:   Image     Comment:   External Document

## 2010-12-11 NOTE — Progress Notes (Signed)
Summary: TRIAGE  Phone Note From Other Clinic Call back at [facility] Phone [phone]   Call For:   Caller: Debra @ Dr Everardo All [phone] Call For: Dr Juanda Chance Reason for Call: Schedule Patient Appt Summary of Call: Would like for pt to be seen sooner than 02-16-10 due Iron Def anemia. Last rov with Dr Juanda Chance 1999-had a Colon 2007. Would be ok to call pt back directly with appoinment if we can work her in. Initial call taken by: Leanor Kail [facility],  December 29, 2009 11:10 AM  Follow-up for Phone Call        Pt. will see Mike Gip [facility] on 01-01-10 at 10am. Follow-up by: Laureen Ochs LPN,  December 29, 2009 11:39 AM

## 2010-12-11 NOTE — Procedures (Signed)
Summary: L[facility] COLON   Colonoscopy  Procedure date:  10/16/2006  Findings:      Location:  [facility].   Patient Name: [name], [name] MRN:  Procedure Procedures: Colonoscopy CPT: [phone].  Personnel: Endoscopist: Gaby Harney L. Juanda Chance, MD.  Exam Location: Exam performed in Outpatient Clinic. Outpatient  Patient Consent: Procedure, Alternatives, Risks and Benefits discussed, consent obtained, from patient. Consent was obtained by the RN.  Indications  Surveillance of: Adenomatous Polyp(s). Initial polypectomy was performed in 2002. in Sep. 1-2 Polyps were found at Index Exam. Largest polyp removed was 1 to 5 mm. Pathology of worst  polyp: tubular adenoma. Previous surveillance exam(s) in  1990, Previous surveillance exam(s) in  1991, Previous surveillance exam(s) in  1997,  Increased Risk Screening: For family history of colorectal neoplasia, in  sibling  History  Current Medications: Patient is taking an non-steroidal medication. Patient is not currently taking Coumadin.  Allergies: Allergic to sulfa, codiene.  Pre-Exam Physical: Performed Oct 16, 2006. Entire physical exam was normal.  Comments: Pt. history reviewed/updated, physical exam performed prior to initiation of sedation?yes Exam Exam: Extent of exam reached: Cecum, extent intended: Cecum.  The cecum was identified by IC valve. Patient position: on left side. Duration of exam: out time 7:29 min minutes. Colon retroflexion performed. Images taken. ASA Classification: II. Tolerance: good.  Monitoring: Pulse and BP monitoring, Oximetry used. Supplemental O2 given.  Colon Prep Used Miralax for colon prep. Prep results: good.  Sedation Meds: Patient assessed and found to be appropriate for moderate (conscious) sedation. Fentanyl 50 mcg. given IV. Versed 6 mg. given IV.  Findings - DIVERTICULOSIS: Ascending Colon to Sigmoid Colon. ICD9: Diverticulosis: 562.10. Comments: universal diverticulosis.  -  NORMAL EXAM: Cecum.  - NORMAL EXAM: Rectum.   Assessment Abnormal examination, see findings above.  Diagnoses: 562.10: Diverticulosis.   Comments: universal diverticulosis, no polyps Events  Unplanned Interventions: No intervention was required.  Unplanned Events: There were no complications. Plans Medication Plan: Fiber supplements: Psyllium 1 Tbsp QD, starting Oct 16, 2006   Comments: in view of her age, no recall interval set  at this point,  Disposition: After procedure patient sent to recovery. After recovery patient sent home.   cc: Romero Belling, MD  This report was created from the original endoscopy report, which was reviewed and signed by the above listed endoscopist.

## 2010-12-11 NOTE — Assessment & Plan Note (Signed)
Summary: flu shot/sae/cd  Nurse Visit   Allergies: 1)  ! Sulfa 2)  ! Pcn 3)  ! Feldene 4)  ! Erythromycin 5)  ! Celebrex 6)  ! Macrobid 7)  ! * Decongestants 8)  ! Betadine  Orders Added: 1)  Flu Vaccine 31yrs + MEDICARE PATIENTS [Q2039] 2)  Administration Flu vaccine - MCR [G0008] Flu Vaccine Consent Questions     Do you have a history of severe allergic reactions to this vaccine? no    Any prior history of allergic reactions to egg and/or gelatin? no    Do you have a sensitivity to the preservative Thimersol? no    Do you have a past history of Guillan-Barre Syndrome? no    Do you currently have an acute febrile illness? no    Have you ever had a severe reaction to latex? no    Vaccine information given and explained to patient? yes    Are you currently pregnant? no    Lot Number:[id number]   Exp Date:05/11/2011   Site Given  Left Deltoid IMistration Flu vaccine - MCR [G0008] .lbmedflu

## 2010-12-11 NOTE — Assessment & Plan Note (Signed)
Summary: IRON DEF ANEMIA/YF              (DR. Juanda Chance PT.)   History of Present Illness Visit Type: Initial Consult Primary GI MD: Lina Sar MD Primary Provider: Minus Breeding MD Requesting Provider: Romero Belling, MD Chief Complaint: Iron def anemia showed on labs from Dr. Everardo All. Pt c/o some fatigue but denies abd pain and changes in bowels.  History of Present Illness:   77 YO FEMALE KNOWN TO DR. Juanda Chance WITH HX OF DIVERTICULOSIS,ADENOMATOUS COLON POLYPS AND FAMILY HX OF COLON CANCER..HER LAST COLONOSCOPY WAS DONE 12/07 -SHE HAS UNIVERSAL DIVERTICULOSIS,NO POLYPS FOUND. SHE IS REFERRED TODAY FOR EVALUATION OF NEW IRON DEFICIENCY ANEMIA. RECENT LABS 12/28/09 SHOWED HGB 10.2/HCT 31.6,MCV93,FE29,TIBC 243,IRON SAT 8.5.. SHE ALSO HAS HX OF B12 DEFICIENCY FOR WHICH SHE GETS B12 INJECTIONS MONTHLY PER DR. ELLISON. SHE HAS NO SPECIFIC GI C/O CURRENTLY. APPETITE IS FAIR,WT HAS DECREASED OVER THE PAST FEW YEARS BUT UNCERTAIN HOW MUCH.Francis Dowse DENIES ABDOMINAL PAIN, NO DYSPHAGIA,HEARTBURN ETC.HER BM'S HAVE BEEN  NORMAL, NO MELENA OR HEME.SHE DOES NOT TAKE ANY REGULAR ASA OR NSAIDS.   GI Review of Systems    Reports acid reflux.      Denies abdominal pain, belching, bloating, chest pain, dysphagia with liquids, dysphagia with solids, heartburn, loss of appetite, nausea, vomiting, vomiting blood, weight loss, and  weight gain.      Reports constipation.     Denies anal fissure, black tarry stools, change in bowel habit, diarrhea, diverticulosis, fecal incontinence, heme positive stool, hemorrhoids, irritable bowel syndrome, jaundice, light color stool, liver problems, rectal bleeding, and  rectal pain. Preventive Screening-Counseling & Management  Alcohol-Tobacco     Smoking Status: never      Drug Use:  no.      Current Medications (verified): 1)  Toprol Xl 50 Mg Tb24 (Metoprolol Succinate) .... Take 1 Tablet By Mouth Once A Day 2)  Verapamil Hcl Cr 240 Mg Tbcr (Verapamil Hcl) .... Take 1 By Mouth  Qd 3)  Glucophage Xr 500 Mg  Tb24 (Metformin Hcl) .[name].. 1-Bid 4)  Zestoretic 20-12.5 Mg  Tabs (Lisinopril-Hydrochlorothiazide) .... Take 2 By Mouth Qd 5)  Ultracet 37.5-325 Mg  Tabs (Tramadol-Acetaminophen) .... Q4h As Needed Pain 6)  Pravachol 40 Mg Tabs (Pravastatin Sodium) .... 2 Qhs 7)  Methocarbamol 500 Mg Tabs (Methocarbamol) .[name].. 1 Qhs 8)  B12 .[name].. 1000 Micrograms Q Month 9)  Lactulose 10 Gm/1ml Soln (Lactulose) .... 3 Tablespoons Bid 10)  Januvia 100 Mg Tabs (Sitagliptin Phosphate) .[name].. 1 Qam  Allergies (verified): 1)  ! Sulfa 2)  ! Pcn 3)  ! Feldene 4)  ! Erythromycin 5)  ! Celebrex 6)  ! Macrobid 7)  ! * Decongestants  Past History:  Past Medical History: DYSLIPIDEMIA, rx limited by perceived intolerance to statins (ICD-272.4)  PERNICIOUS ANEMIA (ICD-281.0)     LUMBOSACRAL RADICULOPATHY (ICD-724.4) KNEE PAIN, RIGHT (ICD-719.46)   BACK PAIN, LUMBAR (ICD-724.2)  DISC DISEASE, LUMBAR (ICD-722.52) CONSTIPATION NOS (ICD-564.00) ABDOMINAL PAIN, CHRONIC (ICD-789.00) ARTHRITIS (ICD-716.90) COLONIC POLYPS (ICD-211.3) ADENOCARCINOMA, COLON, FAMILY HX (ICD-V16.0) HYPERTENSION (ICD-401.9) DIVERTICULITIS, HX OF (ICD-V12.79) DIABETES MELLITUS, TYPE II (ICD-250.00) ALLERGIC RHINITIS (ICD-477.9)  Past Surgical History: Reviewed history from 09/12/2009 and no changes required. Hysterectomy and incidental appendectomy(1974) Cholecystectomy (2003) Back surgery ( 1989)  Family History: Reviewed history from 08/18/2008 and no changes required. sister had breast and colon cancer daughter has breast cancer  Social History: retired widowed Patient has never smoked.  Alcohol Use - no Daily Caffeine Use Illicit Drug Use -  no Drug Use:  no  Review of Systems       The patient complains of anemia, fatigue, and night sweats.  The patient denies allergy/sinus, anxiety-new, arthritis/joint pain, back pain, blood in urine, breast changes/lumps, change in vision,  confusion, cough, coughing up blood, depression-new, fainting, fever, headaches-new, hearing problems, heart murmur, heart rhythm changes, itching, menstrual pain, muscle pains/cramps, nosebleeds, pregnancy symptoms, shortness of breath, skin rash, sleeping problems, sore throat, swelling of feet/legs, swollen lymph glands, thirst - excessive , urination - excessive , urination changes/pain, urine leakage, vision changes, and voice change.         ROS OTHERWISE AS IN HPI  Vital Signs:  Patient profile:   77 year old female Height:      62 inches Weight:      131.25 pounds BMI:     24.09 Pulse rate:   80 / minute Pulse rhythm:   regular BP sitting:   158 / 68  (left arm) Cuff size:   regular  Vitals Entered By: Christie Nottingham CMA Duncan Dull) (January 01, 2010 9:53 AM)  Physical Exam  General:  Well developed, well nourished, no acute distress. Head:  Normocephalic and atraumatic. Eyes:  PERRLA, no icterus. Lungs:  Clear throughout to auscultation. Heart:  Regular rate and rhythm; no murmurs, rubs,  or bruits. Abdomen:  SOFT, NONTENDER, NO MASS OR HSM,BS+ Rectal:  HEME NEGATIVE STOOL Extremities:  No clubbing, cyanosis, edema or deformities noted. Neurologic:  Alert and  oriented x4;  grossly normal neurologically. Psych:  Alert and cooperative. Normal mood and affect.   Impression & Recommendations:  Problem # 1:  ANEMIA, IRON DEFICIENCY (ICD-280.9) Assessment Unchanged 77 YO FEMALE WITH NEW DX OF IRON DEFICIENCY ANEMIA,HEME NEGATIVE STOOL TODAY. PT WITH STRONG FAMILY HX OF COLON CA,PERSONAL HX OF ADENOMATOUS POLYPS. R/O OCCULT GI LOSS-COLONIC ,GASTRIC OR SMALL BOWEL SOURCE.  SCHEDULE FOR EGD AND COLONOSCOPY WITH DR. Theresia Bough DISUSSED IN DETAIL WITH PT AND HER DAUGHTER. START NU-IRON ONE TWICE DAILY X 3 MONTHS, THEN REPEAT IRON STUDIES. Orders: Colon/Endo (Colon/Endo)  Problem # 2:  ANEMIA-B12 DEFICIENCY (ICD-281.1) Assessment: Comment Only  Problem # 3:  PERSONAL HX  COLONIC POLYPS (ICD-V12.72) Assessment: Comment Only ADENOMATOUS POLYPS 2002,NEAGTIVE COLON 2007  Problem # 4:  CONSTIPATION NOS (ICD-564.00) Assessment: Comment Only CONTINUE LACTULOSE AS NEEDED.  Problem # 5:  ADENOCARCINOMA, COLON, FAMILY HX (ICD-V16.0) Assessment: Comment Only  Problem # 6:  DIABETES MELLITUS, TYPE II (ICD-250.00) Assessment: Comment Only  Patient Instructions: 1)  Colonoscopy and Endoscopy scheduled with Dr. Lina Sar for 01-10-10. 2)  Instructions and brochures provided. 3)  Prescription for Colonoscopy prep and Nu-Iron tablets sent to your pharmacy, [facility] [address] 4)  [facility] Patient Information Guide given.  5)  Copy sent to : Sean A. Everardo All, MD 6)  The medication list was reviewed and reconciled.  All changed / newly prescribed medications were explained.  A complete medication list was provided to the patient / caregiver. Prescriptions: NU-IRON 150 MG CAPS (POLYSACCHARIDE IRON COMPLEX) Take 1 tab twice daily x 3 months  #60 x 2   Entered by:   Lowry Ram NCMA   Authorized by:   Sammuel Cooper PA-c   Signed by:   Lowry Ram NCMA on 01/01/2010   Method used:   [facility]ron[facility] to        [facility]  [id number]       [address]       [facility]  [place], [place]  [address]       Ph: [phone] or [phone]       Fax: [phone]   RxID:   [id number] REGLAN 10 MG  TABS (METOCLOPRAMIDE HCL) As per prep instructions.  #2 x 0   Entered by:   Lowry Ram NCMA   Authorized by:   Sammuel Cooper PA-c   Signed by:   Lowry Ram NCMA on 01/01/2010   Method used:   [facility]ically to        [facility]  [address] [id number]       [address]       [place], [place]  [address]       Ph: [phone] or [phone]       Fax: [phone]   RxID:   [id number] DULCOLAX 5 MG  TBEC (BISACODYL) Day before procedure take 2 at 3pm and 2 at 8pm.  #4 x 0   Entered by:   Lowry Ram NCMA    Authorized by:   Sammuel Cooper PA-c   Signed by:   Lowry Ram NCMA on 01/01/2010   Method used:   [facility]ically to        [facility]  [address] [id number]       [address]       [place], [place]  [address]       Ph: [phone] or [phone]       Fax: [phone]   RxID:   [id number] MIRALAX   POWD (POLYETHYLENE GLYCOL 3350) As per prep  instructions.  #255gm x 0   Entered by:   Lowry Ram NCMA   Authorized by:   Sammuel Cooper PA-c   Signed by:   Lowry Ram NCMA on 01/01/2010   Method used:   [facility] to        [facility]  [address] [id number]       [address]       [place], [place]  [address]       Ph: [phone] or [phone]       Fax: [phone]   RxID:   [id number]

## 2010-12-11 NOTE — Medication Information (Signed)
Summary: [facility]  [facility]   Imported By: Sherian Rein 07/09/2010 08:13:18  _____________________________________________________________________  External Attachment:    Type:   Image     Comment:   External Document

## 2010-12-11 NOTE — Assessment & Plan Note (Signed)
Summary: eph/jml   Visit Type:  Follow-up Referring Provider:  Romero Belling, MD Primary Provider:  Minus Breeding MD  CC:  no complaints.  History of Present Illness: Mrs Vandevoort returns after being hospitalized with recurrent chest discomfort.  She underwent coronary catheterization which showed a patent left anterior descending stent with nonobstructive disease otherwise.  He was discharged home. Other than some sharp pain in his left side her neck when she turns her neck she has had no discomfort. She denies any weakness or tingling in her left arm.  She is very compliant with her medications. She has multiple allergies. She has a significant chronic anemia with a discharge hemoglobin 8.4.  Current Medications (verified): 1)  Toprol Xl 50 Mg Tb24 (Metoprolol Succinate) .... Take 1 Tablet By Mouth Once A Day 2)  Verapamil Hcl Cr 240 Mg Tbcr (Verapamil Hcl) .... Take 1 By Mouth Qd 3)  Glucophage Xr 500 Mg  Tb24 (Metformin Hcl) .... 2 Tabs Two Times A Day 4)  Pravachol 40 Mg Tabs (Pravastatin Sodium) .... 2 Qhs 5)  B12 .[name].. 1000 Micrograms Q Month 6)  Januvia 100 Mg Tabs (Sitagliptin Phosphate) .[name].. 1 Qam 7)  Aspirin Ec 325 Mg Tbec (Aspirin) .... Take One Tablet By Mouth Daily 8)  Plavix 75 Mg Tabs (Clopidogrel Bisulfate) .[name].. 1 Tab Once Daily 9)  Nitrostat 0.4 Mg Subl (Nitroglycerin) .[name].. 1 Tablet Under Tongue At Onset of Chest Pain; You May Repeat Every 5 Minutes For Up To 3 Doses. 10)  Lisinopril-Hydrochlorothiazide 20-12.5 Mg Tabs (Lisinopril-Hydrochlorothiazide) .... 2 Tab Once Daily  Allergies (verified): 1)  ! Sulfa 2)  ! Pcn 3)  ! Feldene 4)  ! Erythromycin 5)  ! Celebrex 6)  ! Macrobid 7)  ! * Decongestants 8)  ! Betadine  Past History:  Past Medical History: Last updated: 02/14/2010 PAROXYSMAL ATRIAL FIBRILLATION (ICD-427.31) CAD, NATIVE VESSEL (ICD-414.01) HYPERTENSION (ICD-401.9) DYSLIPIDEMIA (ICD-272.4) EDEMA (ICD-782.3) LEG PAIN, BILATERAL  (ICD-729.5) PALPITATIONS (ICD-785.1) GERD (ICD-530.81) ANEMIA-B12 DEFICIENCY (ICD-281.1) OSTEOARTHRITIS (ICD-715.9) PERSONAL HX COLONIC POLYPS (ICD-V12.72) DIZZINESS AND GIDDINESS (ICD-780.4) ANEMIA, IRON DEFICIENCY (ICD-280.9) ABDOMINAL PAIN (ICD-789.00) ENCOUNTER FOR LONG-TERM USE OF OTHER MEDICATIONS (ICD-V58.69) PERNICIOUS ANEMIA (ICD-281.0) COUGH (ICD-786.2) ACUTE CYSTITIS (ICD-595.0) LUMBOSACRAL RADICULOPATHY (ICD-724.4) KNEE PAIN, RIGHT (ICD-719.46) CLIMACTERIC STATE, FEMALE (ICD-627.2) BACK PAIN, LUMBAR (ICD-724.2) GALLBLADDER DISEASE (ICD-575.9) DISC DISEASE, LUMBAR (ICD-722.52) CONSTIPATION NOS (ICD-564.00) ARTHRITIS (ICD-716.90) COLONIC POLYPS (ICD-211.3) ADENOCARCINOMA, COLON, FAMILY HX (ICD-V16.0) DIVERTICULITIS, HX OF (ICD-V12.79) DIABETES MELLITUS, TYPE II (ICD-250.00) ALLERGIC RHINITIS (ICD-477.9)  Past Surgical History: Last updated: 09/12/2009 Hysterectomy and incidental appendectomy(1974) Cholecystectomy (2003) Back surgery ( 1989)  Family History: Last updated: 08/18/2008 sister had breast and colon cancer daughter has breast cancer  Social History: Last updated: 01/01/2010 retired widowed Patient has never smoked.  Alcohol Use - no Daily Caffeine Use Illicit Drug Use - no  Risk Factors: Smoking Status: never (01/01/2010)  Review of Systems       negative other than history of present illness  Vital Signs:  Patient profile:   76 year old female Height:      62 inches Weight:      129 pounds BMI:     23.68 Pulse rate:   80 / minute BP sitting:   148 / 70  (left arm) Cuff size:   regular  Vitals Entered By: Hardin Negus, RMA (March 06, 2010 10:25 AM)  Physical Exam  General:  Well developed, well nourished, in no acute distress. Head:  normocephalic and atraumatic Eyes:  PERRLA/EOM intact; conjunctiva and lids normal.  Mouth:  Teeth, gums and palate normal. Oral mucosa normal. Neck:  no point tenderness. Otherwise  normal. Lungs:  Clear bilaterally to auscultation and percussion. Heart:  Non-displaced PMI, chest non-tender; regular rate and rhythm, S1, S2 without murmurs, rubs or gallops. Carotid upstroke normal, no bruit. Normal abdominal aortic size, no bruits. Femorals normal pulses, no bruits. Pedals normal pulses. No edema, no varicosities. Msk:  Back normal, normal gait. Muscle strength and tone normal. Pulses:  pulses normal in all 4 extremities Extremities:  right forearm bruise from radial catheterization, good pulses and color Neurologic:  Alert and oriented x 3. Skin:  Intact without lesions or rashes. Psych:  Normal affect.   Impression & Recommendations:  Problem # 1:  CAD, NATIVE VESSEL (ICD-414.01) Assessment Unchanged  Her updated medication list for this problem includes:    Toprol Xl 50 Mg Tb24 (Metoprolol succinate) .[name]... Take 1 tablet by mouth once a day    Verapamil Hcl Cr 240 Mg Tbcr (Verapamil hcl) .[name]... Take 1 by mouth qd    Aspirin Ec 325 Mg Tbec (Aspirin) .[name]... Take one tablet by mouth daily    Plavix 75 Mg Tabs (Clopidogrel bisulfate) .[name]... 1 tab once daily    Nitrostat 0.4 Mg Subl (Nitroglycerin) .[name]... 1 tablet under tongue at onset of chest pain; you may repeat every 5 minutes for up to 3 doses.    Lisinopril-hydrochlorothiazide 20-12.5 Mg Tabs (Lisinopril-hydrochlorothiazide) .[name]... 2 tab once daily  Her updated medication list for this problem includes:    Toprol Xl 50 Mg Tb24 (Metoprolol succinate) .[name]... Take 1 tablet by mouth once a day    Verapamil Hcl Cr 240 Mg Tbcr (Verapamil hcl) .[name]... Take 1 by mouth qd    Aspirin Ec 325 Mg Tbec (Aspirin) .[name]... Take one tablet by mouth daily    Plavix 75 Mg Tabs (Clopidogrel bisulfate) .[name]... 1 tab once daily    Nitrostat 0.4 Mg Subl (Nitroglycerin) .[name]... 1 tablet under tongue at onset of chest pain; you may repeat every 5 minutes for up to 3 doses.    Lisinopril-hydrochlorothiazide 20-12.5 Mg Tabs  (Lisinopril-hydrochlorothiazide) .[name]... 2 tab once daily  Problem # 2:  HYPERTENSION (ICD-401.9) Assessment: Unchanged  Her updated medication list for this problem includes:    Toprol Xl 50 Mg Tb24 (Metoprolol succinate) .[name]... Take 1 tablet by mouth once a day    Verapamil Hcl Cr 240 Mg Tbcr (Verapamil hcl) .[name]... Take 1 by mouth qd    Aspirin Ec 325 Mg Tbec (Aspirin) .[name]... Take one tablet by mouth daily    Lisinopril-hydrochlorothiazide 20-12.5 Mg Tabs (Lisinopril-hydrochlorothiazide) .[name]... 2 tab once daily  Her updated medication list for this problem includes:    Toprol Xl 50 Mg Tb24 (Metoprolol succinate) .[name]... Take 1 tablet by mouth once a day    Verapamil Hcl Cr 240 Mg Tbcr (Verapamil hcl) .[name]... Take 1 by mouth qd    Aspirin Ec 325 Mg Tbec (Aspirin) .[name]... Take one tablet by mouth daily    Lisinopril-hydrochlorothiazide 20-12.5 Mg Tabs (Lisinopril-hydrochlorothiazide) .[name]... 2 tab once daily  Problem # 3:  EDEMA (ICD-782.3) Assessment: Improved  Problem # 4:  DIABETES MELLITUS, TYPE II (ICD-250.00) Assessment: Unchanged  Her updated medication list for this problem includes:    Glucophage Xr 500 Mg Tb24 (Metformin hcl) .[name]... 2 tabs two times a day    Januvia 100 Mg Tabs (Sitagliptin phosphate) .[name]... 1 qam    Aspirin Ec 325 Mg Tbec (Aspirin) .[name]... Take one tablet by mouth daily  Lisinopril-hydrochlorothiazide 20-12.5 Mg Tabs (Lisinopril-hydrochlorothiazide) .[name]... 2 tab once daily  Her updated medication list for this problem includes:    Glucophage Xr 500 Mg Tb24 (Metformin hcl) .[name]... 2 tabs two times a day    Januvia 100 Mg Tabs (Sitagliptin phosphate) .[name]... 1 qam    Aspirin Ec 325 Mg Tbec (Aspirin) .[name]... Take one tablet by mouth daily    Lisinopril-hydrochlorothiazide 20-12.5 Mg Tabs (Lisinopril-hydrochlorothiazide) .[name]... 2 tab once daily  Problem # 5:  PAROXYSMAL ATRIAL FIBRILLATION (ICD-427.31) Assessment: Improved  Her updated medication list for this problem  includes:    Toprol Xl 50 Mg Tb24 (Metoprolol succinate) .[name]... Take 1 tablet by mouth once a day    Aspirin Ec 325 Mg Tbec (Aspirin) .[name]... Take one tablet by mouth daily    Plavix 75 Mg Tabs (Clopidogrel bisulfate) .[name]... 1 tab once daily  Her updated medication list for this problem includes:    Toprol Xl 50 Mg Tb24 (Metoprolol succinate) .[name]... Take 1 tablet by mouth once a day    Aspirin Ec 325 Mg Tbec (Aspirin) .[name]... Take one tablet by mouth daily    Plavix 75 Mg Tabs (Clopidogrel bisulfate) .[name]... 1 tab once daily  Patient Instructions: 1)  Your physician recommends that you schedule a follow-up appointment in: 5 MONTHS WITH DR Navneet Schmuck DUE SEPT 2)  Your physician recommends that you continue on your current medications as directed. Please refer to the Current Medication list given to you today.

## 2010-12-11 NOTE — Progress Notes (Signed)
Summary: U/S Referral  Phone Note Outgoing Call   Call placed by: Dagoberto Reef,  September 19, 2009 2:33 PM Summary of Call: Dr Everardo All, [facility] imaging won't to know if you still won't pt to have U/S since pt had CT on nov 4.  Please advise.   Thanks Initial call taken by: Dagoberto Reef,  September 19, 2009 2:34 PM  Follow-up for Phone Call        please cancel ultrasound Follow-up by: Minus Breeding MD,  September 19, 2009 2:44 PM  Additional Follow-up for Phone Call Additional follow up Details #1::        [facility] cancelled. Additional Follow-up by: Dagoberto Reef,  September 19, 2009 3:25 PM

## 2010-12-11 NOTE — Assessment & Plan Note (Signed)
Summary: PER PT 6 MTH FU---$50---STC   Vital Signs:  Patient profile:   77 year old female Height:      62 inches Weight:      125 pounds BMI:     22.95 Temp:     97.1 degrees F oral Pulse rate:   74 / minute BP sitting:   148 / 72  (left arm) Cuff size:   regular  Vitals Entered By: Bill Salinas CMA (May 17, 2009 10:46 AM) CC: 6 month fu, pt's chart is being ordered to check dates of immunizations and preventive screenings/ ab   CC:  6 month fu and pt's chart is being ordered to check dates of immunizations and preventive screenings/ ab.  History of Present Illness: pt states 1 week of slight pain at the left ear.  no associated nasal congestion. mild anemia was noted in the er recently. takes metformin as rx'ed  Current Medications (verified): 1)  Toprol Xl 50 Mg Tb24 (Metoprolol Succinate) .... Take 1 Tablet By Mouth Once A Day 2)  Verapamil Hcl Cr 240 Mg Tbcr (Verapamil Hcl) .... Take 1 By Mouth Qd 3)  Glucophage Xr 500 Mg  Tb24 (Metformin Hcl) .... 2-Bid 4)  Zestoretic 20-12.5 Mg  Tabs (Lisinopril-Hydrochlorothiazide) .... Take 2 By Mouth Qd 5)  Ultracet 37.5-325 Mg  Tabs (Tramadol-Acetaminophen) .... Q4h As Needed Pain 6)  Isosorbide Mononitrate 10 Mg  Tabs (Isosorbide Mononitrate) .... Qd 7)  Pravachol 40 Mg Tabs (Pravastatin Sodium) .... 2 Qhs 8)  Methocarbamol 500 Mg Tabs (Methocarbamol) .[name].. 1 Qhs 9)  Patanol 0.1 % Soln (Olopatadine Hcl) .[name].. 1 Drop in Each Eye Two Times A Day 10)  Claritin 10 Mg Tabs (Loratadine) .[name].. 1 By Mouth Once Daily As Needed Allergies  Allergies (verified): 1)  ! Sulfa 2)  ! Pcn 3)  ! Feldene 4)  ! Erythromycin 5)  ! Celebrex 6)  ! Macrobid 7)  ! * Decongestants  Past History:  Past Medical History: Last updated: 02/07/2009 BACK PAIN, LUMBAR (ICD-724.2) GALLBLADDER DISEASE (ICD-575.9) DISC DISEASE, LUMBAR (ICD-722.52) CONSTIPATION NOS (ICD-564.00) ABDOMINAL PAIN, CHRONIC (ICD-789.00) DYSLIPIDEMIA, rx limited by perceived  intolerance to statins (ICD-272.4) ARTHRITIS (ICD-716.90) COLONIC POLYPS (ICD-211.3) ADENOCARCINOMA, COLON, FAMILY HX (ICD-V16.0) HYPERTENSION (ICD-401.9) DIVERTICULITIS, HX OF (ICD-V12.79) DIABETES MELLITUS, TYPE II (ICD-250.00) ALLERGIC RHINITIS (ICD-477.9) PALPITATIONS (ICD-785.1) LEG PAIN, BILATERAL (ICD-729.5) COUGH (ICD-786.2) ACUTE CYSTITIS (ICD-595.0) LUMBOSACRAL RADICULOPATHY (ICD-724.4) KNEE PAIN, RIGHT (ICD-719.46) EDEMA (ICD-782.3) CLIMACTERIC STATE, FEMALE (ICD-627.2)  Review of Systems  The patient denies fever, hematochezia, and hematuria.    Physical Exam  General:  normal appearance.   Ears:  TM's intact and clear with normal canals with grossly normal hearing.   Additional Exam:  test results are reviewed:  Vitamin B12          [L]  108 pg/mL                   211-911         Iron Saturation      [L]  15.4 %                      20.0-50.0  Tests: (3) CBC Platelet w/Diff (CBCD)   White Cell Count     [L]  4.2 K/uL                    4.5-10.5   Hemoglobin           [L]  10.1 g/dL  12.0-15.0   Hematocrit           [L]  30.7 %                      36.0-46.0   Platelet Count            207.0 K/uL                  150.0-400.0    Hemoglobin A1C       [H]  6.6 %     Impression & Recommendations:  Problem # 1:  otitis media persistent  Problem # 2:  UNSPECIFIED ANEMIA (ICD-285.9) appears multifactorial  Problem # 3:  PERNICIOUS ANEMIA (ICD-281.0) Assessment: New  Problem # 4:  DIABETES MELLITUS, TYPE II (ICD-250.00) well-controlled  Medications Added to Medication List This Visit: 1)  Doxycycline Hyclate 100 Mg Caps (Doxycycline hyclate) .[name].. 1 bid 2)  B12  .[name].. 1000 micrograms q month  Other Orders: TLB-A1C / Hgb A1C (Glycohemoglobin) (83036-A1C) T-Bone Densitometry (16109) TLB-B12 + Folate Pnl (60454_09811-B14/NWG) TLB-IBC Pnl (Iron/FE;Transferrin) (83550-IBC) TLB-CBC Platelet - w/Differential (85025-CBCD) Est. Patient Level  IV (95621)  Patient Instructions: 1)  doxycycline 100 mg two times a day 2)  bone density test 3)  tests are being ordered for you today.  a few days after the test(s), please call [phone] to hear your test results.  this is very important to do because the results may change the instructions you see here 4)  return 6 mos 5)  (update: i left message on phone-tree:  b12 1000 micrograms q month, and fe 1/day) 6)  same metformin Prescriptions: DOXYCYCLINE HYCLATE 100 MG CAPS (DOXYCYCLINE HYCLATE) 1 bid  #14 x 0   Entered and Authorized by:   Minus Breeding MD   Signed by:   Minus Breeding MD on 05/17/2009   Method used:   [facility]ronically to        [facility]  [address] [id number]       [address]       [place], [place]  [address]       Ph: [phone] or [phone]       Fax: [phone]   RxID:   [id number]

## 2010-12-11 NOTE — Assessment & Plan Note (Signed)
Summary: EAR PAIN /NWS  #   Vital Signs:  Patient profile:   77 year old female Height:      62 inches (157.48 cm) Weight:      128 pounds (58.18 kg) BMI:     23.50 O2 Sat:      94 % on Room air Temp:     97.9 degrees F (36.61 degrees C) oral Pulse rate:   72 / minute Pulse rhythm:   regular BP sitting:   128 / 64  (left arm) Cuff size:   regular  Vitals Entered By: Brenton Grills CMA Duncan Dull) (October 09, 2010 11:25 AM)  O2 Flow:  Room air CC: Left Ear pain x 2 days/pt is no longer taking Iron capsules/aj Is Patient Diabetic? Yes   Referring Provider:  Romero Belling, MD Primary Provider:  Minus Breeding MD  CC:  Left Ear pain x 2 days/pt is no longer taking Iron capsules/aj.  History of Present Illness: pt states 3 days of mild pain at the left ear,  but no assoc nasal congestion. no cbg record, but states cbg's are well-controlled. she no longer takes fe tabs.   Current Medications (verified): 1)  Toprol Xl 50 Mg Tb24 (Metoprolol Succinate) .... Take 1 Tablet By Mouth Once A Day 2)  Glucophage Xr 500 Mg  Tb24 (Metformin Hcl) .[name].. 1 Tabs Two Times A Day 3)  Pravachol 40 Mg Tabs (Pravastatin Sodium) .... 2 Qhs 4)  B12 .[name].. 1000 Micrograms Q Month 5)  Januvia 100 Mg Tabs (Sitagliptin Phosphate) .[name].. 1 Qam 6)  Aspirin 81 Mg Tbec (Aspirin) .... Take One Tablet By Mouth Daily 7)  Nitrostat 0.4 Mg Subl (Nitroglycerin) .[name].. 1 Tablet Under Tongue At Onset of Chest Pain; You May Repeat Every 5 Minutes For Up To 3 Doses. 8)  Lisinopril-Hydrochlorothiazide 20-12.5 Mg Tabs (Lisinopril-Hydrochlorothiazide) .... 2 Tab Once Daily 9)  Pepcid Ac Maximum Strength 20 Mg Tabs (Famotidine) .[name].. 1 Tab Once Daily 10)  Loratadine-Pseudoephedrine 10-240 Mg Xr24h-Tab (Loratadine-Pseudoephedrine) .[name].. 1 Once Daily As Needed For Congestion 11)  Nu-Iron 150 Mg Caps (Polysaccharide Iron Complex) .[name].. 1 Cap Once Daily  Allergies (verified): 1)  ! Sulfa 2)  ! Pcn 3)  ! Feldene 4)  !  Erythromycin 5)  ! Celebrex 6)  ! Macrobid 7)  ! * Decongestants 8)  ! Betadine  Past History:  Past Medical History: Last updated: 02/14/2010 PAROXYSMAL ATRIAL FIBRILLATION (ICD-427.31) CAD, NATIVE VESSEL (ICD-414.01) HYPERTENSION (ICD-401.9) DYSLIPIDEMIA (ICD-272.4) EDEMA (ICD-782.3) LEG PAIN, BILATERAL (ICD-729.5) PALPITATIONS (ICD-785.1) GERD (ICD-530.81) ANEMIA-B12 DEFICIENCY (ICD-281.1) OSTEOARTHRITIS (ICD-715.9) PERSONAL HX COLONIC POLYPS (ICD-V12.72) DIZZINESS AND GIDDINESS (ICD-780.4) ANEMIA, IRON DEFICIENCY (ICD-280.9) ABDOMINAL PAIN (ICD-789.00) ENCOUNTER FOR LONG-TERM USE OF OTHER MEDICATIONS (ICD-V58.69) PERNICIOUS ANEMIA (ICD-281.0) COUGH (ICD-786.2) ACUTE CYSTITIS (ICD-595.0) LUMBOSACRAL RADICULOPATHY (ICD-724.4) KNEE PAIN, RIGHT (ICD-719.46) CLIMACTERIC STATE, FEMALE (ICD-627.2) BACK PAIN, LUMBAR (ICD-724.2) GALLBLADDER DISEASE (ICD-575.9) DISC DISEASE, LUMBAR (ICD-722.52) CONSTIPATION NOS (ICD-564.00) ARTHRITIS (ICD-716.90) COLONIC POLYPS (ICD-211.3) ADENOCARCINOMA, COLON, FAMILY HX (ICD-V16.0) DIVERTICULITIS, HX OF (ICD-V12.79) DIABETES MELLITUS, TYPE II (ICD-250.00) ALLERGIC RHINITIS (ICD-477.9)  Review of Systems  The patient denies fever.         denies sore throat  Physical Exam  General:  normal appearance.   Head:  head: no deformity eyes: no periorbital swelling, no proptosis external nose and ears are normal mouth: no lesion seen Ears:  left tm is slightly red (right is normal) Additional Exam:  Sodium               [L]  128 mEq/L                   135-145   Potassium                 4.8 mEq/L                   3.5-5.1   Chloride             [L]  95 mEq/L                    96-112   Carbon Dioxide            25 mEq/L                    19-32   Glucose              [H]  106 mg/dL                   16-10   BUN                       22 mg/dL                    9-60 Creatinine                0.9 mg/dL      Hemoglobin           [L]   11.4 g/dL                   45.4-09.8 Hematocrit           [L]  33.6 %    Iron Saturation      [L]  18.1 %                      20.0-50.0  Hemoglobin A1C       [H]  6.6 %       Impression & Recommendations:  Problem # 1:  aom mild  Problem # 2:  HYPONATREMIA (ICD-276.1) Assessment: New  Problem # 3:  DIZZINESS AND GIDDINESS (ICD-780.4) prob due to #2  Problem # 4:  ANEMIA, IRON DEFICIENCY (ICD-280.9) needs increased rx  Problem # 5:  DIABETES MELLITUS, TYPE II (ICD-250.00) well-controlled  Medications Added to Medication List This Visit: 1)  Lisinopril-hydrochlorothiazide 20-12.5 Mg Tabs (Lisinopril-hydrochlorothiazide) .[name].. 1 tab once daily 2)  Levaquin 750 Mg Tabs (Levofloxacin) .... 1/2 tab once daily  Other Orders: Admin of Therapeutic Inj  intramuscular or subcutaneous (11914) Vit B12 1000 mcg (J3420) TLB-Lipid Panel (80061-LIPID) TLB-BMP (Basic Metabolic Panel-BMET) (80048-METABOL) TLB-CBC Platelet - w/Differential (85025-CBCD) TLB-Hepatic/Liver Function Pnl (80076-HEPATIC) TLB-TSH (Thyroid Stimulating Hormone) (84443-TSH) TLB-IBC Pnl (Iron/FE;Transferrin) (83550-IBC) TLB-A1C / Hgb A1C (Glycohemoglobin) (83036-A1C) TLB-Microalbumin/Creat Ratio, Urine (82043-MALB) TLB-Udip w/ Micro (81001-URINE) Est. Patient Level IV (78295)  Patient Instructions: 1)  levaquin, 1/2 of 750 mg once daily. 2)  blood tests are being ordered for you today.  please call [phone] to hear your test results. 3)  please schedule a "medicare wellness" appointment. 4)  (update: i left message on phone-tree:  reduce zestoretic to 1/day.  take fe 1/day.  ret 2 weeks). Prescriptions: LEVAQUIN 750 MG TABS (LEVOFLOXACIN) 1/2 tab once daily  #3 x 0   Entered and Authorized by:   Minus Breeding MD   Signed by:   Minus Breeding MD on 10/09/2010  Method used:   Print then Give to Patient   RxID:   [id number] LEVAQUIN 750 MG TABS (LEVOFLOXACIN) 1/2 tab once daily  #3 x 0   Entered and  Authorized by:   Minus Breeding MD   Signed by:   Minus Breeding MD on 10/09/2010   Method used:   [facility]  [address] [id number]       [address]       [place], [place]  [address]       Ph: [phone] or [phone]       Fax: [phone]   RxID:   [id number]    Medication Administration  Injection # 1:    Medication: Vit B12 1000 mcg    Diagnosis: PERNICIOUS ANEMIA (ICD-281.0)    Route: IM    Site: L deltoid    Exp Date: 06/2012    Lot #: [id number]    Mfr: [employer]    Patient tolerated injection without complications    Given by: Brenton Grills CMA Duncan Dull) (October 09, 2010 12:10 PM)  Orders Added: 1)  Admin of Therapeutic Inj  intramuscular or subcutaneous [96372] 2)  Vit B12 1000 mcg [J3420] 3)  TLB-Lipid Panel [80061-LIPID] 4)  TLB-BMP (Basic Metabolic Panel-BMET) [80048-METABOL] 5)  TLB-CBC Platelet - w/Differential [85025-CBCD] 6)  TLB-Hepatic/Liver Function Pnl [80076-HEPATIC] 7)  TLB-TSH (Thyroid Stimulating Hormone) [84443-TSH] 8)  TLB-IBC Pnl (Iron/FE;Transferrin) [83550-IBC] 9)  TLB-A1C / Hgb A1C (Glycohemoglobin) [83036-A1C] 10)  TLB-Microalbumin/Creat Ratio, Urine [82043-MALB] 11)  TLB-Udip w/ Micro [81001-URINE] 12)  Est. Patient Level IV [73710]

## 2010-12-11 NOTE — Procedures (Signed)
Summary: Colonoscopy  Patient: [name] Note: All result statuses are Final unless otherwise noted.  Tests: (1) Colonoscopy (COL)   COL Colonoscopy           DONE     [facility]     [address]     [place], [place]  [address]           COLONOSCOPY PROCEDURE REPORT           PATIENT:  Pautsch, Estelle  MR#:  [id number]     BIRTHDATE:  [birth year], 85 yrs. old  GENDER:  female           ENDOSCOPIST:  Hedwig Morton. Juanda Chance, MD     Referred by:  Cleophas Dunker Everardo All, M.D.           PROCEDURE DATE:  01/10/2010     PROCEDURE:  Colonoscopy 19147     ASA CLASS:  Class II     INDICATIONS:  Iron Deficiency Anemia hx adenom polyp 2007, family     hx of colon cancer           MEDICATIONS:   Versed 4 mg, Fentanyl 25 mcg           DESCRIPTION OF PROCEDURE:   After the risks benefits and     alternatives of the procedure were thoroughly explained, informed     consent was obtained.  Digital rectal exam was performed and     revealed no rectal masses.   The LB [id number] endoscope     was introduced through the anus and advanced to the cecum, which     was identified by both the appendix and ileocecal valve, without     limitations.  The quality of the prep was adequate, using MiraLax.     The instrument was then slowly withdrawn as the colon was fully     examined.     <<PROCEDUREIMAGES>>           FINDINGS:  A sessile polyp was found in the descending colon. at     70 cm diminutive polyp removed The polyp was removed using cold     biopsy forceps (see image8).  Moderate diverticulosis was found     throughout the colon (see image7, image4, image1, and image2).     Internal hemorrhoids were found (see image10 and image9).  This was     otherwise a normal examination of the colon (see image5, image6,     and image7).   Retroflexed views in the rectum revealed no     abnormalities.    The scope was then withdrawn from the patient     and the procedure completed.         COMPLICATIONS:  None           ENDOSCOPIC IMPRESSION:     1) Sessile polyp in the descending colon     2) Moderate diverticulosis throughout the colon     3) Internal hemorrhoids     4) Otherwise normal examination     RECOMMENDATIONS:     1) Await pathology results     nothing to account for iron deficiency anemia     continue Iron supplements, and recheck CBC today,     consider SBCEndoscopy           REPEAT EXAM:  In 5 year(s) for.           ______________________________     Hedwig Morton. Juanda Chance, MD  CC:           n.     eSIGNED:   Dora M. Brodie at 01/10/2010 10:40 AM           Page 2 of 3   [place], [place], [phone]  Note: An exclamation mark (!) indicates a result that was not dispersed into the flowsheet. Document Creation Date: 01/10/2010 10:41 AM _______________________________________________________________________  (1) Order result status: Final Collection or observation date-time: 01/10/2010 10:26 Requested date-time:  Receipt date-time:  Reported date-time:  Referring Physician:   Ordering Physician: Lina Sar [phone]) Specimen Source:  Source: Launa Grill Order Number: [id number] Lab site:   Appended Document: Colonoscopy     Procedures Next Due Date:    Colonoscopy: 01/2015

## 2010-12-11 NOTE — Assessment & Plan Note (Signed)
Summary: f/u appt per pt/#/cd   Vital Signs:  Patient profile:   77 year old female Height:      62 inches (157.48 cm) Weight:      132.25 pounds (60.11 kg) O2 Sat:      98 % on Room air Temp:     97.0 degrees F (36.11 degrees C) oral Pulse rate:   85 / minute BP supine:   160 / 80  (right arm) BP sitting:   160 / 80  (right arm) Cuff size:   regular  Vitals Entered By: Josph Macho RMA (December 28, 2009 10:51 AM)  O2 Flow:  Room air CC: Follow-up visit/ CF Is Patient Diabetic? Yes   Primary Provider:  Minus Breeding MD  CC:  Follow-up visit/ CF.  History of Present Illness: pt c/o excessive appetite.  no cbg record, but states cbg's are well-controlled. she denies any further nausea. she c/o slight non-postural dizziness.  no associated headache.  Current Medications (verified): 1)  Toprol Xl 50 Mg Tb24 (Metoprolol Succinate) .... Take 1 Tablet By Mouth Once A Day 2)  Verapamil Hcl Cr 240 Mg Tbcr (Verapamil Hcl) .... Take 1 By Mouth Qd 3)  Glucophage Xr 500 Mg  Tb24 (Metformin Hcl) .[name].. 1-Bid 4)  Zestoretic 20-12.5 Mg  Tabs (Lisinopril-Hydrochlorothiazide) .... Take 2 By Mouth Qd 5)  Ultracet 37.5-325 Mg  Tabs (Tramadol-Acetaminophen) .... Q4h As Needed Pain 6)  Pravachol 40 Mg Tabs (Pravastatin Sodium) .... 2 Qhs 7)  Methocarbamol 500 Mg Tabs (Methocarbamol) .[name].. 1 Qhs 8)  B12 .[name].. 1000 Micrograms Q Month 9)  Lactulose 10 Gm/67ml Soln (Lactulose) .... 3 Tablespoons Bid 10)  Januvia 100 Mg Tabs (Sitagliptin Phosphate) .[name].. 1 Qam  Allergies (verified): 1)  ! Sulfa 2)  ! Pcn 3)  ! Feldene 4)  ! Erythromycin 5)  ! Celebrex 6)  ! Macrobid 7)  ! * Decongestants  Past History:  Past Medical History: Last updated: 08/21/2009 DYSLIPIDEMIA, rx limited by perceived intolerance to statins (ICD-272.4) ENCOUNTER FOR LONG-TERM USE OF OTHER MEDICATIONS (ICD-V58.69) PERNICIOUS ANEMIA (ICD-281.0) UNSPECIFIED ANEMIA (ICD-285.9) PALPITATIONS (ICD-785.1) LEG PAIN,  BILATERAL (ICD-729.5) COUGH (ICD-786.2) ACUTE CYSTITIS (ICD-595.0) LUMBOSACRAL RADICULOPATHY (ICD-724.4) KNEE PAIN, RIGHT (ICD-719.46) EDEMA (ICD-782.3) CLIMACTERIC STATE, FEMALE (ICD-627.2) BACK PAIN, LUMBAR (ICD-724.2) GALLBLADDER DISEASE (ICD-575.9) DISC DISEASE, LUMBAR (ICD-722.52) CONSTIPATION NOS (ICD-564.00) ABDOMINAL PAIN, CHRONIC (ICD-789.00) ARTHRITIS (ICD-716.90) COLONIC POLYPS (ICD-211.3) ADENOCARCINOMA, COLON, FAMILY HX (ICD-V16.0) HYPERTENSION (ICD-401.9) DIVERTICULITIS, HX OF (ICD-V12.79) DIABETES MELLITUS, TYPE II (ICD-250.00) ALLERGIC RHINITIS (ICD-477.9)  Physical Exam  Lungs:  Clear to auscultation bilaterally. Normal respiratory effort.  Heart:  Regular rate and rhythm without murmurs or gallops noted. Normal S1,S2.   Pulses:  no carotid bruit  Additional Exam:  Hemoglobin           [L]  10.2 g/dL                   16.1-09.6 Hematocrit           [L]  31.6 %   Iron Saturation      [L]  8.5 %                       20.0-50.0 Hemoglobin A1C       [H]  6.9 %    Impression & Recommendations:  Problem # 1:  DIZZINESS AND GIDDINESS (ICD-780.4) mild.  uncertain etiology  Problem # 2:  DIABETES MELLITUS, TYPE II (ICD-250.00) well-controlled  Problem # 3:  ANEMIA, IRON DEFICIENCY (  ICD-280.9) persistent  Problem # 4:  HYPERTENSION (ICD-401.9) with ? of situational component  Other Orders: Admin of Therapeutic Inj  intramuscular or subcutaneous (53664) Vit B12 1000 mcg (J3420) TLB-CBC Platelet - w/Differential (85025-CBCD) TLB-IBC Pnl (Iron/FE;Transferrin) (83550-IBC) TLB-A1C / Hgb A1C (Glycohemoglobin) (83036-A1C) TLB-BMP (Basic Metabolic Panel-BMET) (80048-METABOL) Gastroenterology Referral (GI) Est. Patient Level IV ([address])  Patient Instructions: 1)  tests are being ordered for you today.  a few days after the test(s), please call [phone] to hear your test results. 2)  pending the test results, please continue the same medications for now 3)   we'll follow the blood pressure for now. 4)  medicare wellness visit in 3 months. 5)  update: i called pt 12/28/09: 6)  refer to dr Juanda Chance, to consider egd 7)  take non-prescription fe tabs, 2/day   Medication Administration  Injection # 1:    Medication: Vit B12 1000 mcg    Diagnosis: PERNICIOUS ANEMIA (ICD-281.0)    Route: IM    Site: L deltoid    Exp Date: 9/12    Lot #: [id number]    Mfr: [employer] Regent    Patient tolerated injection without complications    Given by: Josph Macho RMA (December 28, 2009 11:28 AM)  Orders Added: 1)  Admin of Therapeutic Inj  intramuscular or subcutaneous [96372] 2)  Vit B12 1000 mcg [J3420] 3)  TLB-CBC Platelet - w/Differential [85025-CBCD] 4)  TLB-IBC Pnl (Iron/FE;Transferrin) [83550-IBC] 5)  TLB-A1C / Hgb A1C (Glycohemoglobin) [83036-A1C] 6)  TLB-BMP (Basic Metabolic Panel-BMET) [80048-METABOL] 7)  Gastroenterology Referral [GI] 8)  Est. Patient Level IV [87564]

## 2010-12-11 NOTE — Letter (Signed)
Summary: Eye Exam / Venita Lick Exam / Elmer Picker   Imported By: Lennie Odor 05/11/2009 14:16:30  _____________________________________________________________________  External Attachment:    Type:   Image     Comment:   External Document

## 2010-12-11 NOTE — Consult Note (Signed)
Summary: Beather Arbour, MD  Beather Arbour, MD   Imported By: Lanelle Bal 04/28/2008 15:23:35  _____________________________________________________________________  External Attachment:    Type:   Image     Comment:   External Document

## 2010-12-11 NOTE — Assessment & Plan Note (Signed)
Summary: BACK-LEG PAIN--FLU SHOT ALSO-$50--STC   Vital Signs:  Patient Profile:   77 Years Old Female Height:     62 inches Weight:      133 pounds Temp:     97.0 degrees F oral Pulse rate:   71 / minute BP sitting:   140 / 70  (right arm) Cuff size:   regular  Vitals Entered By: Jerilynn Mages (August 11, 2008 10:41 AM)                 Chief Complaint:  back &leg pain/was given the flu shot today.  History of Present Illness: pt states 6 days severe pain rad from the left lower back to the lat aspect of the left leg.  no associated numbness.  no local injury. takes bp meds as rx'ed    Current Allergies: ! SULFA ! PCN ! FELDENE ! ERYTHROMYCIN ! CELEBREX ! MACROBID ! * DECONGESTANTS  Past Medical History:    Reviewed history from 11/06/2007 and no changes required:       BACK PAIN, LUMBAR (ICD-724.2)       GALLBLADDER DISEASE (ICD-575.9)       DISC DISEASE, LUMBAR (ICD-722.52)       CONSTIPATION NOS (ICD-564.00)       ABDOMINAL PAIN, CHRONIC (ICD-789.00)       DYSLIPIDEMIA, rx limited by perceived intolerance to statins (ICD-272.4)       ARTHRITIS (ICD-716.90)       COLONIC POLYPS (ICD-211.3)       ADENOCARCINOMA, COLON, FAMILY HX (ICD-V16.0)       HYPERTENSION (ICD-401.9)       DIVERTICULITIS, HX OF (ICD-V12.79)       DIABETES MELLITUS, TYPE II (ICD-250.00)       ALLERGIC RHINITIS (ICD-477.9)     Review of Systems  The patient denies fever and incontinence.         denies difficulty starting urinary stream.    Physical Exam  General:     well developed, well nourished, in no acute distress Msk:     gait: favors lle Extremities:     strength normal throughout the le's, excpet where limited by pain Neurologic:     sensation is intact to touch on the feet  Additional Exam:     A1C%                 [H]  7.2 %                       4.6-6.0 CHOLESTEROL, LDL-DIRECT              155.3 mg/dL    Impression & Recommendations:  Problem # 1:   LUMBOSACRAL RADICULOPATHY (ICD-724.4)  Problem # 2:  HYPERTENSION (ICD-401.9) prob exacerbated by pain  Medications Added to Medication List This Visit: 1)  Medrol (pak) 4 Mg Tabs (Methylprednisolone) .... As dir  Other Orders: Flu Vaccine 9yrs + (04540) Administration Flu vaccine (G0008) TLB-BMP (Basic Metabolic Panel-BMET) (80048-METABOL) TLB-TSH (Thyroid Stimulating Hormone) (84443-TSH) TLB-Lipid Panel (80061-LIPID) TLB-A1C / Hgb A1C (Glycohemoglobin) (83036-A1C)   Patient Instructions: 1)  medrol dosepack 2)  ret 7d 3)  same rx for bp for now 4)  labs   Prescriptions: ULTRACET 37.5-325 MG  TABS (TRAMADOL-ACETAMINOPHEN) q4h as needed pain  #50 x 2   Entered and Authorized by:   Minus Breeding MD   Signed by:   Minus Breeding MD on 08/11/2008   Method used:  Electronically to        [facility]  [address] [id number]       [address]       [place], [place]  [address]       Ph: [phone] or [phone]       Fax: [phone]   RxID:   [id number] MEDROL (PAK) 4 MG TABS (METHYLPREDNISOLONE) as dir  #1 x 0   Entered and Authorized by:   Minus Breeding MD   Signed by:   Minus Breeding MD on 08/11/2008   Method used:   [facility]        [facility]  [address] [id number]       [address]       [place], [place]  [address]       Ph: [phone] or [phone]       Fax: [phone]   RxID:   [id number]  ] Flu Vaccine Consent Questions     Do you have a history of severe allergic reactions to this vaccine? no    Any prior history of allergic reactions to egg and/or gelatin? no    Do you have a sensitivity to the preservative Thimersol? no    Do you have a past history of Guillan-Barre Syndrome? no    Do you currently have an acute febrile illness? no    Have you ever had a severe reaction to latex? no    Vaccine information given and explained to patient? yes    Are you currently pregnant?  no    Lot Number:[id number]   Site Given  Left Deltoid IM

## 2010-12-11 NOTE — Assessment & Plan Note (Signed)
Summary: BACK PROBLEMS/NWS  Medications Added HYDROCODONE-ACETAMINOPHEN 5-500 MG TABS (HYDROCODONE-ACETAMINOPHEN) Take 1 tablet by mouth four times a day LISINOPRIL-HYDROCHLOROTHIAZIDE 20-25 MG TABS (LISINOPRIL-HYDROCHLOROTHIAZIDE)  TOPROL XL 50 MG TB24 (METOPROLOL SUCCINATE) Take 1 tablet by mouth once a day VERAPAMIL HCL CR 240 MG TBCR (VERAPAMIL HCL)  ZESTORETIC 20-12.5 MG TABS (LISINOPRIL-HYDROCHLOROTHIAZIDE) Take 2 tablet by mouth once a day        Vital Signs:  Patient Profile:   77 Years Old Female Weight:      138.8 pounds Temp:     97.3 degrees F oral Pulse rate:   68 / minute BP sitting:   145 / 74  (left arm) Cuff size:   regular  Vitals Entered By: Orlan Leavens (October 07, 2007 11:29 AM)                 Visit Type:  Acute Visit  Chief Complaint:  back pain.  History of Present Illness:  patient states a history of many years of low back pain.  She had surgery for this in 1980.  he states it flared up again about one month ago.  she is unable to cite any precipitating factor for this flareup.  The pain radiates to the lateral aspect of the left thigh.     Past Medical History:    Reviewed history from 04/23/2007 and no changes required:       Allergic rhinitis       Diabetes mellitus, type II       Diverticulitis, hx of       Hypertension     Review of Systems       denies numbness   Physical Exam  General:     healthy appearing.  elderly Msk:     no back tend gait normal Neurologic:     sensation intact to touch on the feet    Impression & Recommendations:  Problem # 1:  BACK PAIN, LUMBAR (ICD-724.2)  Orders: T-Pelvis 1or 2 views (72170TC) T-Lumbar Spine 2 Views (72100TC) Est. Patient Level III ([address])   Medications Added to Medication List This Visit: 1)  Hydrocodone-acetaminophen 5-500 Mg Tabs (Hydrocodone-acetaminophen) .... Take 1 tablet by mouth four times a day 2)  Lisinopril-hydrochlorothiazide 20-25 Mg Tabs  (Lisinopril-hydrochlorothiazide) 3)  Toprol Xl 50 Mg Tb24 (Metoprolol succinate) .... Take 1 tablet by mouth once a day 4)  Verapamil Hcl Cr 240 Mg Tbcr (Verapamil hcl) 5)  Zestoretic 20-12.5 Mg Tabs (Lisinopril-hydrochlorothiazide) .... Take 2 tablet by mouth once a day   Patient Instructions: 1)  check xrays 2)  refilled vicodin    Prescriptions: HYDROCODONE-ACETAMINOPHEN 5-500 MG TABS (HYDROCODONE-ACETAMINOPHEN) Take 1 tablet by mouth four times a day  #60 x 2   Entered and Authorized by:   Minus Breeding MD   Signed by:   Minus Breeding MD on 10/07/2007   Method used:   Print then Give to Patient   RxID:   [id number]  ]

## 2010-12-11 NOTE — Progress Notes (Signed)
Summary: LM for pt to come to lab, repeat Iron Studies  ---- Converted from flag ---- ---- 01/01/2010 10:57 AM, Lowry Ram NCMA wrote: Call pt to come to lab to repeat iron studies from OV with Amy Esterwood PA-C on 01-01-10. ------------------------------  Called and LM for pt today 03-29-10  to come to our lab to repeat Iron studies that were drawn in Feb, 2011.  I put the lab order in IDX.   Appended Document: LM for pt to come to lab, repeat Iron Studies Pt had labs today 5-19 Dr Everardo All ordered.  IBC Panel included.

## 2010-12-11 NOTE — Assessment & Plan Note (Signed)
Summary: 1 MTH FU-STC   Vital Signs:  Patient Profile:   77 Years Old Female Height:     62 inches Weight:      140 pounds Temp:     97.7 degrees F oral Pulse rate:   67 / minute BP sitting:   140 / 70  (left arm)  Vitals Entered By: Glendell Docker (November 06, 2007 9:37 AM)                 Visit Type:  f/u  Chief Complaint:  dm.  History of Present Illness: diabetes, patient is currently taking only the Actos and metformin.  She is not currently taking januvia or Amaryl.  She says the [place] was too expensive. on no rx for osteoporosis takes meds as rx'ed for htn  Current Allergies (reviewed today): ! SULFA ! PCN ! FELDENE ! ERYTHROMYCIN ! CELEBREX ! MACROBID ! * DECONGESTANTS  Past Medical History:    Reviewed history from 04/23/2007 and no changes required:       Allergic rhinitis       Diabetes mellitus, type II       Diverticulitis, hx of       Hypertension       Current Problems:        BACK PAIN, LUMBAR (ICD-724.2)       GALLBLADDER DISEASE (ICD-575.9)       DISC DISEASE, LUMBAR (ICD-722.52)       CONSTIPATION NOS (ICD-564.00)       ABDOMINAL PAIN, CHRONIC (ICD-789.00)       DYSLIPIDEMIA, rx limited by perceived intolerance to statins (ICD-272.4)       ARTHRITIS (ICD-716.90)       COLONIC POLYPS (ICD-211.3)       ADENOCARCINOMA, COLON, FAMILY HX (ICD-V16.0)       HYPERTENSION (ICD-401.9)       DIVERTICULITIS, HX OF (ICD-V12.79)       DIABETES MELLITUS, TYPE II (ICD-250.00)       ALLERGIC RHINITIS (ICD-477.9)                  Risk Factors:  Mammogram History:     Date of Last Mammogram:  06/29/2007    Results:  normal   PAP Smear History:     Date of Last PAP Smear:  06/29/2007    Results:  normal    Review of Systems  The patient denies unusual weight change and chest pain.     Physical Exam  General:     elderly, no distress Extremities:     no deformity.  no ulcer on the feet.  feet are of normal color and temp.  no  edema there is bilateral onychomycosis of the toenails.  There are a few varicosities on the ankles.    Impression & Recommendations:  Problem # 1:  DIABETES MELLITUS, TYPE II (ICD-250.00)  Her updated medication list for this problem includes:    Actos 15 Mg Tabs (Pioglitazone hcl) ..... Qd    Glucophage Xr 500 Mg Tb24 (Metformin hcl) .[name]..[name] Two times a day  Orders: TLB-A1C / Hgb A1C (Glycohemoglobin) (83036-A1C) TLB-BMP (Basic Metabolic Panel-BMET) (80048-METABOL)   Problem # 2:  HYPERTENSION (ICD-401.9)  The following medications were removed from the medication list:    Zestoretic 20-12.5 Mg Tabs (Lisinopril-hydrochlorothiazide) .[name]... Take 2 tablet by mouth once a day  Her updated medication list for this problem includes:    Lisinopril-hydrochlorothiazide 20-25 Mg Tabs (Lisinopril-hydrochlorothiazide)    Toprol Xl 50 Mg Tb24 (  Metoprolol succinate) .[name]... Take 1 tablet by mouth once a day    Verapamil Hcl Cr 240 Mg Tbcr (Verapamil hcl)    Norvasc 2.5 Mg Tabs (Amlodipine besylate) ..... Qd  Orders: Est. Patient Level IV (254[address])   Problem # 3:  CLIMACTERIC STATE, FEMALE (ICD-627.2)  Orders: T-Bone Densitometry ([address]237)   Medications Added to Medication List This Visit: 1)  Actos 15 Mg Tabs (Pioglitazone hcl) .... Qd 2)  Glucophage Xr 500 Mg Tb24 (Metformin hcl) .... Two times a day 3)  Norvasc 2.5 Mg Tabs (Amlodipine besylate) .... Qd   Patient Instructions: 1)  check a1c 2)  recheck dexa 3)  add norvasc 2.5/d 4)  ret 6 mos    Prescriptions: NORVASC 2.5 MG  TABS (AMLODIPINE BESYLATE) qd  #30 x 11   Entered and Authorized by:   Minus Breeding MD   Signed by:   Minus Breeding MD on 11/06/2007   Method used:   [facility] to ...       [facility]  [address] [id number]       [address]       [place], [place]  [address]       Ph: [phone] or [phone]       Fax: [phone]   RxID:   [id number]  ]  Preventive Care  Screening  Last Flu Shot:    Date:  09/09/2007    Results:  given   Mammogram:    Date:  06/29/2007    Results:  normal   Pap Smear:    Date:  06/29/2007    Results:  normal   Last Pneumovax:    Date:  09/06/2003    Results:  given

## 2010-12-11 NOTE — Letter (Signed)
Summary: [facility]  [facility]   Imported By: Earl Many 02/15/2010 18:30:09  _____________________________________________________________________  External Attachment:    Type:   Image     Comment:   External Document

## 2010-12-11 NOTE — Assessment & Plan Note (Signed)
Summary: yearly/cy   Visit Type:  1 yr f/u Primary [name]:  Minus Breeding MD  CC:  no cardiac complaints.  History of Present Illness: [name] comes in today for followup of her history of difficult control hypertension and palpitations.  She offers no complaints today. She is extremely active. She had a history of syncope in the past but has had no recurrent event.  Her medications are reviewed. She thought up by primary care we will laboratory data et Karie Soda.  Current Medications (verified): 1)  Toprol Xl 50 Mg Tb24 (Metoprolol Succinate) .... Take 1 Tablet By Mouth Once A Day 2)  Verapamil Hcl Cr 240 Mg Tbcr (Verapamil Hcl) .... Take 1 By Mouth Qd 3)  Glucophage Xr 500 Mg  Tb24 (Metformin Hcl) .... 2-Bid 4)  Zestoretic 20-12.5 Mg  Tabs (Lisinopril-Hydrochlorothiazide) .... Take 2 By Mouth Qd 5)  Ultracet 37.5-325 Mg  Tabs (Tramadol-Acetaminophen) .... Q4h As Needed Pain 6)  Pravachol 40 Mg Tabs (Pravastatin Sodium) .... 2 Qhs 7)  Methocarbamol 500 Mg Tabs (Methocarbamol) .[name].. 1 Qhs 8)  B12 .[name].. 1000 Micrograms Q Month  Allergies: 1)  ! Sulfa 2)  ! Pcn 3)  ! Feldene 4)  ! Erythromycin 5)  ! Celebrex 6)  ! Macrobid 7)  ! * Decongestants  Past History:  Past Medical History: Last updated: 08/21/2009 DYSLIPIDEMIA, rx limited by perceived intolerance to statins (ICD-272.4) ENCOUNTER FOR LONG-TERM USE OF OTHER MEDICATIONS (ICD-V58.69) PERNICIOUS ANEMIA (ICD-281.0) UNSPECIFIED ANEMIA (ICD-285.9) PALPITATIONS (ICD-785.1) LEG PAIN, BILATERAL (ICD-729.5) COUGH (ICD-786.2) ACUTE CYSTITIS (ICD-595.0) LUMBOSACRAL RADICULOPATHY (ICD-724.4) KNEE PAIN, RIGHT (ICD-719.46) EDEMA (ICD-782.3) CLIMACTERIC STATE, FEMALE (ICD-627.2) BACK PAIN, LUMBAR (ICD-724.2) GALLBLADDER DISEASE (ICD-575.9) DISC DISEASE, LUMBAR (ICD-722.52) CONSTIPATION NOS (ICD-564.00) ABDOMINAL PAIN, CHRONIC (ICD-789.00) ARTHRITIS (ICD-716.90) COLONIC POLYPS (ICD-211.3) ADENOCARCINOMA, COLON, FAMILY  HX (ICD-V16.0) HYPERTENSION (ICD-401.9) DIVERTICULITIS, HX OF (ICD-V12.79) DIABETES MELLITUS, TYPE II (ICD-250.00) ALLERGIC RHINITIS (ICD-477.9)  Past Surgical History: Last updated: 02/11/2008 Hysterectomy (1974) Cholecystectomy (2003) Back surgery ( 1989)  Family History: Last updated: 08/18/2008 sister had breast and colon cancer daughter has breast cancer  Social History: Last updated: 08/18/2008 retired widowed  Risk Factors: Smoking Status: never (04/23/2007)  Review of Systems       negative other than history of present illness  Vital Signs:  Patient profile:   77 year old female Height:      62 inches Weight:      128 pounds BMI:     23.50 Pulse rate:   72 / minute Pulse rhythm:   regular BP sitting:   120 / 70  (left arm) Cuff size:   large  Vitals Entered By: Danielle Rankin, CMA (August 30, 2009 11:43 AM)  Physical Exam  General:  Well developed, well nourished, in no acute distress. Head:  normocephalic and atraumatic Mouth:  Teeth, gums and palate normal. Oral mucosa normal. Neck:  Neck supple, no JVD. No masses, thyromegaly or abnormal cervical nodes. Chest Wall:  no deformities or breast masses noted Lungs:  Clear bilaterally to auscultation and percussion. Heart:  Non-displaced PMI, chest non-tender; regular rate and rhythm, S1, S2 without murmurs, rubs or gallops. Carotid upstroke normal, no bruit. Normal abdominal aortic size, no bruits. Femorals normal pulses, no bruits. Pedals normal pulses. No edema, no varicosities. Abdomen:  Bowel sounds positive; abdomen soft and non-tender without masses, organomegaly, or hernias noted. No hepatosplenomegaly. Msk:  Back normal, normal gait. Muscle strength and tone normal. Pulses:  pulses normal in all 4 extremities Extremities:  No clubbing or cyanosis.  Neurologic:  Alert and oriented x 3. Skin:  Intact without lesions or rashes. Psych:  Normal affect.   EKG  Procedure date:   08/21/2009  Findings:      normal sinus rhythm, first-degree A-V block, no change.  Impression & Recommendations:  Problem # 1:  PALPITATIONS (ICD-785.1) Assessment Improved  Her updated medication list for this problem includes:    Toprol Xl 50 Mg Tb24 (Metoprolol succinate) .[name]... Take 1 tablet by mouth once a day    Verapamil Hcl Cr 240 Mg Tbcr (Verapamil hcl) .[name]... Take 1 by mouth qd    Zestoretic 20-12.5 Mg Tabs (Lisinopril-hydrochlorothiazide) .[name]... Take 2 by mouth qd  Problem # 2:  HYPERTENSION (ICD-401.9) Assessment: Improved  Her updated medication list for this problem includes:    Toprol Xl 50 Mg Tb24 (Metoprolol succinate) .[name]... Take 1 tablet by mouth once a day    Verapamil Hcl Cr 240 Mg Tbcr (Verapamil hcl) .[name]... Take 1 by mouth qd    Zestoretic 20-12.5 Mg Tabs (Lisinopril-hydrochlorothiazide) .[name]... Take 2 by mouth qd  Patient Instructions: 1)  Your physician recommends that you schedule a follow-up appointment in:  12 MONTHS 2)  Your physician recommends that you continue on your current medications as directed. Please refer to the Current Medication list given to you today.

## 2010-12-11 NOTE — Progress Notes (Signed)
Summary: leg pain  Phone Note Call from Patient Call back at [phone]   Caller: Daughter-Patti  Summary of Call: Patient was seen on tuesday and given levaquin. Patient c/o leg pain since starting the medication. Please advise what pt should do Initial call taken by: Rock Nephew CMA,  December 15, 2008 11:43 AM  Follow-up for Phone Call        not a side-effect of the levaquin. please offer ov for the leg pain Follow-up by: Minus Breeding MD,  December 15, 2008 12:47 PM  Additional Follow-up for Phone Call Additional follow up Details #1::        called pt daughter to inform pt  daughter will call back monday to make an appt Additional Follow-up by: Shelbie Proctor,  December 16, 2008 9:08 AM

## 2010-12-11 NOTE — Assessment & Plan Note (Signed)
Summary: ALLERGIES/SINUSES/LD   Vital Signs:  Patient profile:   77 year old female Height:      62 inches Weight:      126 pounds BMI:     23.13 Temp:     97.5 degrees F oral Pulse rate:   80 / minute Pulse rhythm:   regular BP sitting:   122 / 70  (left arm) Cuff size:   regular  Vitals Entered By: Liane Comber (March 04, 2009 10:42 AM)  History of Present Illness: has hx of mild allergies in past- has never had it this bad cough sneeze and eyes running -- very miserable- worse outdoors  eyes are not red-- just running clear fluid nose is really runny  all clear discharge  occ prod cough- also clear  ? if had fever - but now better -- had chills earlier this week   no colored d/c or crust in eyes and no swelling vision is ok  no otc meds   Allergies: 1)  ! Sulfa 2)  ! Pcn 3)  ! Feldene 4)  ! Erythromycin 5)  ! Celebrex 6)  ! Macrobid 7)  ! * Decongestants  Past History:  Past Medical History:    BACK PAIN, LUMBAR (ICD-724.2)    GALLBLADDER DISEASE (ICD-575.9)    DISC DISEASE, LUMBAR (ICD-722.52)    CONSTIPATION NOS (ICD-564.00)    ABDOMINAL PAIN, CHRONIC (ICD-789.00)    DYSLIPIDEMIA, rx limited by perceived intolerance to statins (ICD-272.4)    ARTHRITIS (ICD-716.90)    COLONIC POLYPS (ICD-211.3)    ADENOCARCINOMA, COLON, FAMILY HX (ICD-V16.0)    HYPERTENSION (ICD-401.9)    DIVERTICULITIS, HX OF (ICD-V12.79)    DIABETES MELLITUS, TYPE II (ICD-250.00)    ALLERGIC RHINITIS (ICD-477.9)    PALPITATIONS (ICD-785.1)    LEG PAIN, BILATERAL (ICD-729.5)    COUGH (ICD-786.2)    ACUTE CYSTITIS (ICD-595.0)    LUMBOSACRAL RADICULOPATHY (ICD-724.4)    KNEE PAIN, RIGHT (ICD-719.46)    EDEMA (ICD-782.3)    CLIMACTERIC STATE, FEMALE (ICD-627.2)     (02/07/2009)  Past Surgical History:    Reviewed history from 02/11/2008 and no changes required:    Hysterectomy (1974)    Cholecystectomy (2003)    Back surgery ( 1989)  Review of Systems General:  Complains  of chills; denies fever, loss of appetite, and malaise. Eyes:  Complains of eye irritation and itching; denies blurring and red eye. ENT:  Complains of postnasal drainage; denies ear discharge, earache, nosebleeds, sinus pressure, and sore throat. CV:  Denies chest pain or discomfort and palpitations. Resp:  Complains of cough; denies shortness of breath, sputum productive, and wheezing. Derm:  Denies itching and rash; no hx of exzema. Allergy:  Complains of seasonal allergies and sneezing; denies hives or rash.  Physical Exam  General:  Well-developed,well-nourished,in no acute distress; alert,appropriate and cooperative throughout examination Head:  normocephalic, atraumatic, and no abnormalities observed.  no sinus tenderness  Eyes:  vision grossly intact, pupils equal, pupils round, and pupils reactive to light.  mild conj injection with clear d/c- no swelling or vision change Ears:  R ear normal and L ear normal.   Nose:  nares pale and boggy bilat  Mouth:  pharynx pink and moist, no erythema, and no exudates.  clear post nasal drip noted  Neck:  No deformities, masses, or tenderness noted. Lungs:  CTA , distant bs at bases  no rales/rhonchi or wheeze  Heart:  Normal rate and regular rhythm. S1 and S2 normal without gallop, murmur,  click, rub or other extra sounds. Skin:  Intact without suspicious lesions or rashes Cervical Nodes:  No lymphadenopathy noted Psych:  normal affect, talkative and pleasant    Impression & Recommendations:  Problem # 1:  ALLERGIC RHINITIS (ICD-477.9) Assessment Deteriorated worse with recent pollen - rhinitis and conjunctivitis  claritin otc  patanol opth susp two times a day  continue through all season as needed  pt advised to update me if symptoms worsen or do not improve  Her updated medication list for this problem includes:    Claritin 10 Mg Tabs (Loratadine) .[name]... 1 by mouth once daily as needed allergies  Complete Medication List: 1)   Toprol Xl 50 Mg Tb24 (Metoprolol succinate) .... Take 1 tablet by mouth once a day 2)  Verapamil Hcl Cr 240 Mg Tbcr (Verapamil hcl) .... Take 1 by mouth qd 3)  Glucophage Xr 500 Mg Tb24 (Metformin hcl) .... 2-bid 4)  Zestoretic 20-12.5 Mg Tabs (Lisinopril-hydrochlorothiazide) .... Take 2 by mouth qd 5)  Ultracet 37.5-325 Mg Tabs (Tramadol-acetaminophen) .... Q4h as needed pain 6)  Isosorbide Mononitrate 10 Mg Tabs (Isosorbide mononitrate) .... Qd 7)  Pravachol 40 Mg Tabs (Pravastatin sodium) .... 2 qhs 8)  Methocarbamol 500 Mg Tabs (Methocarbamol) .[name].. 1 qhs 9)  Patanol 0.1 % Soln (Olopatadine hcl) .[name].. 1 drop in each eye two times a day 10)  Claritin 10 Mg Tabs (Loratadine) .[name].. 1 by mouth once daily as needed allergies  Patient Instructions: 1)  take claritin over the counter 10 mg once daily for nasal symptoms 2)  start patanol eye drops two times a day  3)  if you develop fever /facial pain or any other symptoms - update me  4)  if not improving next week- call  Prescriptions: PATANOL 0.1 % SOLN (OLOPATADINE HCL) 1 drop in each eye two times a day  #1 bottle x 1   Entered and Authorized by:   Judith Part MD   Signed by:   Judith Part MD on 03/04/2009   Method used:   Print then Give to Patient   RxID:   [id number]       Prior Medications (reviewed today): TOPROL XL 50 MG TB24 (METOPROLOL SUCCINATE) Take 1 tablet by mouth once a day VERAPAMIL HCL CR 240 MG TBCR (VERAPAMIL HCL) TAKE 1 by mouth QD GLUCOPHAGE XR 500 MG  TB24 (METFORMIN HCL) 2-bid ZESTORETIC 20-12.5 MG  TABS (LISINOPRIL-HYDROCHLOROTHIAZIDE) TAKE 2 by mouth QD ULTRACET 37.5-325 MG  TABS (TRAMADOL-ACETAMINOPHEN) q4h as needed pain ISOSORBIDE MONONITRATE 10 MG  TABS (ISOSORBIDE MONONITRATE) qd PRAVACHOL 40 MG TABS (PRAVASTATIN SODIUM) 2 qhs METHOCARBAMOL 500 MG TABS (METHOCARBAMOL) 1 qhs Current Allergies (reviewed today): ! SULFA ! PCN ! FELDENE ! ERYTHROMYCIN ! CELEBREX ! MACROBID ! *  DECONGESTANTS Current Medications (including changes made in today's visit):  TOPROL XL 50 MG TB24 (METOPROLOL SUCCINATE) Take 1 tablet by mouth once a day VERAPAMIL HCL CR 240 MG TBCR (VERAPAMIL HCL) TAKE 1 by mouth QD GLUCOPHAGE XR 500 MG  TB24 (METFORMIN HCL) 2-bid ZESTORETIC 20-12.5 MG  TABS (LISINOPRIL-HYDROCHLOROTHIAZIDE) TAKE 2 by mouth QD ULTRACET 37.5-325 MG  TABS (TRAMADOL-ACETAMINOPHEN) q4h as needed pain ISOSORBIDE MONONITRATE 10 MG  TABS (ISOSORBIDE MONONITRATE) qd PRAVACHOL 40 MG TABS (PRAVASTATIN SODIUM) 2 qhs METHOCARBAMOL 500 MG TABS (METHOCARBAMOL) 1 qhs PATANOL 0.1 % SOLN (OLOPATADINE HCL) 1 drop in each eye two times a day CLARITIN 10 MG TABS (LORATADINE) 1 by mouth once daily as needed allergies

## 2010-12-11 NOTE — Progress Notes (Signed)
Summary: med question  Phone Note Call from Patient Call back at Home Phone [phone] Call back at [phone]   Caller: Patient Reason for Call: Talk to Nurse Summary of Call: request to speak to nurse about ASA, causing burning in stomach Initial call taken by: Migdalia Dk,  April 24, 2010 10:38 AM  Follow-up for Phone Call        PER PT C/O BURNING EVERY DAY  HAS DECREASED ASA TO 81 MG AND TAKES WITH FOOD  NO TARRY OR BLOOD IN STOOL OR URINE NOTED  DOES TAKE ASA WITH FOOD PLEASE ADVISE. Follow-up by: Scherrie Bateman, LPN,  April 24, 2010 11:18 AM  Additional Follow-up for Phone Call Additional follow up Details #1::        OK. Additional Follow-up by: Gaylord Shih, MD, [facility],  April 24, 2010 12:46 PM     Appended Document: med question I spoke with [name] about the burning in her stomach.  She will make sure she takes her aspirin with food. She will call back if she has any further problems. Lisabeth Devoid RN

## 2010-12-11 NOTE — Miscellaneous (Signed)
Summary: bone density  Clinical Lists Changes  Orders: Added new Test order of T-Lumbar Vertebral Assessment (77082) - Signed 

## 2010-12-11 NOTE — Progress Notes (Signed)
Summary: Paperchart  ---- Converted from flag ---- ---- 12/28/2009 11:14 AM, Minus Breeding MD wrote: paper chart please ? egd ------------------------------  Phone Note Other Incoming   Summary of Call: Paperchart requested. Initial call taken by: Josph Macho RMA,  December 28, 2009 11:31 AM  Follow-up for Phone Call        I don't see were there was one done? Paperchart on MD's desk. Follow-up by: Josph Macho RMA,  December 28, 2009 3:46 PM

## 2010-12-11 NOTE — Assessment & Plan Note (Signed)
Summary: ANKLES ARE SWELLING /NWS  $50   Vital Signs:  Patient Profile:   77 Years Old Female Height:     62 inches Weight:      140 pounds Temp:     97.5 degrees F oral Pulse rate:   69 / minute BP sitting:   129 / 70  (left arm) Cuff size:   small  Vitals Entered By: Orlan Leavens (January 28, 2008 10:27 AM)                 Visit Type:  Acute Visit  Chief Complaint:  SWELLING IN ANKLES.  History of Present Illness: pt says recently developed edema of both ankles. takes bp meds as rx'ed    Current Allergies: ! SULFA ! PCN ! FELDENE ! ERYTHROMYCIN ! CELEBREX ! MACROBID ! * DECONGESTANTS  Past Medical History:    Reviewed history from 11/06/2007 and no changes required:       Allergic rhinitis       Diabetes mellitus, type II       Diverticulitis, hx of       Hypertension       Current Problems:        BACK PAIN, LUMBAR (ICD-724.2)       GALLBLADDER DISEASE (ICD-575.9)       DISC DISEASE, LUMBAR (ICD-722.52)       CONSTIPATION NOS (ICD-564.00)       ABDOMINAL PAIN, CHRONIC (ICD-789.00)       DYSLIPIDEMIA, rx limited by perceived intolerance to statins (ICD-272.4)       ARTHRITIS (ICD-716.90)       COLONIC POLYPS (ICD-211.3)       ADENOCARCINOMA, COLON, FAMILY HX (ICD-V16.0)       HYPERTENSION (ICD-401.9)       DIVERTICULITIS, HX OF (ICD-V12.79)       DIABETES MELLITUS, TYPE II (ICD-250.00)       ALLERGIC RHINITIS (ICD-477.9)                   Review of Systems  The patient denies dyspnea on exhertion.     Physical Exam  General:     healthy appearing.   Extremities:     no deformity.  no ulcer.  normal color and temp.  trace left pedal edema and trace right pedal edema.  few varicosities are present     Impression & Recommendations:  Problem # 1:  EDEMA (ICD-782.3)  Orders: Vascular Clinic (Vascular)   Problem # 2:  HYPERTENSION (ICD-401.9)  The following medications were removed from the medication list:  Lisinopril-hydrochlorothiazide 20-25 Mg Tabs (Lisinopril-hydrochlorothiazide)    Norvasc 2.5 Mg Tabs (Amlodipine besylate) ..... Qd  Her updated medication list for this problem includes:    Toprol Xl 50 Mg Tb24 (Metoprolol succinate) .[name]... Take 1 tablet by mouth once a day    Verapamil Hcl Cr 240 Mg Tbcr (Verapamil hcl)    Zestoretic 20-12.5 Mg Tabs (Lisinopril-hydrochlorothiazide) .[name]... 2 qd  Orders: Est. Patient Level III (78295)   Medications Added to Medication List This Visit: 1)  Zestoretic 20-12.5 Mg Tabs (Lisinopril-hydrochlorothiazide) .... 2 qd  Other Orders: TLB-A1C / Hgb A1C (Glycohemoglobin) (83036-A1C)   Patient Instructions: 1)  d/c norvasc 2)  ret 14d--will probably need to increase bp meds 3)  venous dopplers    Prescriptions: ZESTORETIC 20-12.5 MG  TABS (LISINOPRIL-HYDROCHLOROTHIAZIDE) 2 qd  #60 x 11   Entered and Authorized by:   Minus Breeding MD   Signed by:   Gregary Signs  Ardeen Garland MD on 01/28/2008   Method used:   Electronically sent to ...       [facility]  [address] [id number]       [address]       [place], [place]  [address]       Ph: [phone] or [phone]       Fax: [phone]   RxID:   [id number] ACTOS 15 MG  TABS (PIOGLITAZONE HCL) qd  #30 x 11   Entered and Authorized by:   Minus Breeding MD   Signed by:   Minus Breeding MD on 01/28/2008   Method used:   Electronically sent to ...       [facility]  [address] [id number]       [address]       [place], [place]  [address]       Ph: [phone] or [phone]       Fax: [phone]   RxID:   [id number]  ]

## 2010-12-11 NOTE — Assessment & Plan Note (Signed)
Summary: B-12 PER MSG ON PHONE TREE/ SAE /NWS  Nurse Visit   Allergies: 1)  ! Sulfa 2)  ! Pcn 3)  ! Feldene 4)  ! Erythromycin 5)  ! Celebrex 6)  ! Macrobid 7)  ! * Decongestants  Medication Administration  Injection # 1:    Medication: Vit B12 1000 mcg    Diagnosis: PERNICIOUS ANEMIA (ICD-281.0)    Route: IM    Site: R deltoid    Exp Date: 01-2011    Lot #: [id number]    Mfr: [employer]    Patient tolerated injection without complications    Given by: Beola Cord, CMA (June 07, 2009 8:48 AM)  Orders Added: 1)  Vit B12 1000 mcg [J3420] 2)  Admin of Therapeutic Inj  intramuscular or subcutaneous [[address]]

## 2010-12-11 NOTE — Assessment & Plan Note (Signed)
Summary: 4 month rov.sl   Visit Type:  4 mo f/u Referring Provider:  Romero Belling, MD Primary Provider:  Minus Breeding MD  CC:  chest discomfort at times states pt...pt denies anynother complaints today.  History of Present Illness: Mrs Roane returns today for evaluation and management of coronary disease.  She's had no angina or ischemic symptoms. She has not taken any nitroglycerin. She is now off of her verapamil as I ask her to on her last visit.  She denies orthopnea, PND or edema. She has had no falls or recurrent syncope.  Current Medications (verified): 1)  Toprol Xl 50 Mg Tb24 (Metoprolol Succinate) .... Take 1 Tablet By Mouth Once A Day 2)  Glucophage Xr 500 Mg  Tb24 (Metformin Hcl) .[name].. 1 Tabs Two Times A Day 3)  Pravachol 40 Mg Tabs (Pravastatin Sodium) .... 2 Qhs 4)  B12 .[name].. 1000 Micrograms Q Month 5)  Januvia 100 Mg Tabs (Sitagliptin Phosphate) .[name].. 1 Qam 6)  Aspirin 81 Mg Tbec (Aspirin) .... Take One Tablet By Mouth Daily 7)  Nitrostat 0.4 Mg Subl (Nitroglycerin) .[name].. 1 Tablet Under Tongue At Onset of Chest Pain; You May Repeat Every 5 Minutes For Up To 3 Doses. 8)  Lisinopril-Hydrochlorothiazide 20-12.5 Mg Tabs (Lisinopril-Hydrochlorothiazide) .... 2 Tab Once Daily 9)  Pepcid Ac Maximum Strength 20 Mg Tabs (Famotidine) .[name].. 1 Tab Once Daily 10)  Loratadine-Pseudoephedrine 10-240 Mg Xr24h-Tab (Loratadine-Pseudoephedrine) .[name].. 1 Once Daily As Needed For Congestion 11)  Nu-Iron 150 Mg Caps (Polysaccharide Iron Complex) .[name].. 1 Cap Once Daily  Allergies: 1)  ! Sulfa 2)  ! Pcn 3)  ! Feldene 4)  ! Erythromycin 5)  ! Celebrex 6)  ! Macrobid 7)  ! * Decongestants 8)  ! Betadine  Past History:  Past Medical History: Last updated: 02/14/2010 PAROXYSMAL ATRIAL FIBRILLATION (ICD-427.31) CAD, NATIVE VESSEL (ICD-414.01) HYPERTENSION (ICD-401.9) DYSLIPIDEMIA (ICD-272.4) EDEMA (ICD-782.3) LEG PAIN, BILATERAL (ICD-729.5) PALPITATIONS (ICD-785.1) GERD  (ICD-530.81) ANEMIA-B12 DEFICIENCY (ICD-281.1) OSTEOARTHRITIS (ICD-715.9) PERSONAL HX COLONIC POLYPS (ICD-V12.72) DIZZINESS AND GIDDINESS (ICD-780.4) ANEMIA, IRON DEFICIENCY (ICD-280.9) ABDOMINAL PAIN (ICD-789.00) ENCOUNTER FOR LONG-TERM USE OF OTHER MEDICATIONS (ICD-V58.69) PERNICIOUS ANEMIA (ICD-281.0) COUGH (ICD-786.2) ACUTE CYSTITIS (ICD-595.0) LUMBOSACRAL RADICULOPATHY (ICD-724.4) KNEE PAIN, RIGHT (ICD-719.46) CLIMACTERIC STATE, FEMALE (ICD-627.2) BACK PAIN, LUMBAR (ICD-724.2) GALLBLADDER DISEASE (ICD-575.9) DISC DISEASE, LUMBAR (ICD-722.52) CONSTIPATION NOS (ICD-564.00) ARTHRITIS (ICD-716.90) COLONIC POLYPS (ICD-211.3) ADENOCARCINOMA, COLON, FAMILY HX (ICD-V16.0) DIVERTICULITIS, HX OF (ICD-V12.79) DIABETES MELLITUS, TYPE II (ICD-250.00) ALLERGIC RHINITIS (ICD-477.9)  Past Surgical History: Last updated: 09/12/2009 Hysterectomy and incidental appendectomy(1974) Cholecystectomy (2003) Back surgery ( 1989)  Family History: Last updated: 08/18/2008 sister had breast and colon cancer daughter has breast cancer  Social History: Last updated: 01/01/2010 retired widowed Patient has never smoked.  Alcohol Use - no Daily Caffeine Use Illicit Drug Use - no  Risk Factors: Smoking Status: never (01/01/2010)  Review of Systems       negative other than history of present illness  Vital Signs:  Patient profile:   77 year old female Height:      62 inches Weight:      129.8 pounds BMI:     23.83 Pulse rate:   74 / minute Pulse rhythm:   irregular BP sitting:   128 / 70  (left arm) Cuff size:   large  Vitals Entered By: Danielle Rankin, CMA (August 23, 2010 11:30 AM)  Physical Exam  General:  Well developed, well nourished, in no acute distress. Head:  normocephalic and atraumatic Eyes:  PERRLA/EOM intact; conjunctiva and lids normal.  Neck:  Neck supple, no JVD. No masses, thyromegaly or abnormal cervical nodes. Lungs:  Clear bilaterally to auscultation and  percussion. Heart:  regular rate and rhythm, normal S1-S2, no gallop. Carotid upstrokes equal bilaterally without bruit Msk:  Back normal, normal gait. Muscle strength and tone normal. Pulses:  pulses normal in all 4 extremities Extremities:  trace left pedal edema and trace right pedal edema.   Neurologic:  Alert and oriented x 3. Skin:  Intact without lesions or rashes. Psych:  Normal affect.   Impression & Recommendations:  Problem # 1:  CAD, NATIVE VESSEL (ICD-414.01) Assessment Unchanged  The following medications were removed from the medication list:    Plavix 75 Mg Tabs (Clopidogrel bisulfate) .[name]... 1 tab once daily Her updated medication list for this problem includes:    Toprol Xl 50 Mg Tb24 (Metoprolol succinate) .[name]... Take 1 tablet by mouth once a day    Aspirin 81 Mg Tbec (Aspirin) .[name]... Take one tablet by mouth daily    Nitrostat 0.4 Mg Subl (Nitroglycerin) .[name]... 1 tablet under tongue at onset of chest pain; you may repeat every 5 minutes for up to 3 doses.    Lisinopril-hydrochlorothiazide 20-12.5 Mg Tabs (Lisinopril-hydrochlorothiazide) .[name]... 2 tab once daily  Orders: EKG w/ Interpretation (93000)   Patient Instructions: 1)  Your physician recommends that you schedule a follow-up appointment in: May 2012 2)  Your physician recommends that you continue on your current medications as directed. Please refer to the Current Medication list given to you today.

## 2010-12-11 NOTE — Assessment & Plan Note (Signed)
Summary: 2 WK FU-$50-STC   Vital Signs:  Patient Profile:   77 Years Old Female Height:     62 inches Weight:      138.8 pounds Temp:     97.6 degrees F oral Pulse rate:   77 / minute BP sitting:   153 / 70  (right arm) Cuff size:   regular  Vitals Entered By: Orlan Leavens (February 11, 2008 10:42 AM)                 Visit Type:  Follow-up Visit  Chief Complaint:  2 WEEK FOLLOW-UP/ PT ALSO REQUESTING MD TO CHANGE ACTOS TO SOMETHING ELSE PATINET STATES TOO EXPENSIVE.  History of Present Illness: pt states actos too expensive pt states 5 days right knee pain, and associated pain left hip takes bp meds as rx'ed    Current Allergies: ! SULFA ! PCN ! FELDENE ! ERYTHROMYCIN ! CELEBREX ! MACROBID ! * DECONGESTANTS  Past Medical History:    Reviewed history from 11/06/2007 and no changes required:       Allergic rhinitis       Diabetes mellitus, type II       Diverticulitis, hx of       Hypertension       Current Problems:        BACK PAIN, LUMBAR (ICD-724.2)       GALLBLADDER DISEASE (ICD-575.9)       DISC DISEASE, LUMBAR (ICD-722.52)       CONSTIPATION NOS (ICD-564.00)       ABDOMINAL PAIN, CHRONIC (ICD-789.00)       DYSLIPIDEMIA, rx limited by perceived intolerance to statins (ICD-272.4)       ARTHRITIS (ICD-716.90)       COLONIC POLYPS (ICD-211.3)       ADENOCARCINOMA, COLON, FAMILY HX (ICD-V16.0)       HYPERTENSION (ICD-401.9)       DIVERTICULITIS, HX OF (ICD-V12.79)       DIABETES MELLITUS, TYPE II (ICD-250.00)       ALLERGIC RHINITIS (ICD-477.9)                Past Surgical History:    Hysterectomy (1974)    Cholecystectomy (2003)    Back surgery ( 1989)     Review of Systems  The patient denies unusual weight change and dyspnea on exhertion.     Physical Exam  General:     normal appearance.   Msk:     right knee: no swelling/tend/warmth.  has full rom with slight pain Extremities:     1+ left pedal edema and 1+ right pedal edema.       Impression & Recommendations:  Problem # 1:  DIABETES MELLITUS, TYPE II (ICD-250.00) therapy limited by financial factors The following medications were removed from the medication list:    Actos 15 Mg Tabs (Pioglitazone hcl) .[name]... Take 1 by mouth qd  Her updated medication list for this problem includes:    Glucophage Xr 500 Mg Tb24 (Metformin hcl) .[name]..[name] Two times a day HgbA1C: 6.8 (01/28/2008) Cholesterol: 221 (09/04/2006)   HDL: 56.3 (09/04/2006)   LDL: DEL (09/04/2006)   Triglycerides: 80 (09/04/2006) Creatinine: 1.1 (11/06/2007)   Problem # 2:  KNEE PAIN, RIGHT (ICD-719.46)  Orders: T-Knee Right 2 view (73560TC) TLB-Uric Acid, Blood (84550-URIC) TLB-Sedimentation Rate (ESR) (85651-ESR) Est. Patient Level IV (60454)   Problem # 3:  HYPERTENSION (ICD-401.9)  Her updated medication list for this problem includes:    Toprol Xl 50 Mg  Tb24 (Metoprolol succinate) .[name]... Take 1 tablet by mouth once a day    Verapamil Hcl Cr 240 Mg Tbcr (Verapamil hcl) .[name]... Take 1 by mouth qd    Zestoretic 20-12.5 Mg Tabs (Lisinopril-hydrochlorothiazide) .[name]... Take 2 by mouth qd   Medications Added to Medication List This Visit: 1)  Verapamil Hcl Cr 240 Mg Tbcr (Verapamil hcl) .... Take 1 by mouth qd 2)  Actos 15 Mg Tabs (Pioglitazone hcl) .... Take 1 by mouth qd 3)  Zestoretic 20-12.5 Mg Tabs (Lisinopril-hydrochlorothiazide) .... Take 2 by mouth qd 4)  Ultracet 37.5-325 Mg Tabs (Tramadol-acetaminophen) .... Q4h as needed pain 5)  Isosorbide Mononitrate 10 Mg Tabs (Isosorbide mononitrate) .... Qd   Patient Instructions: 1)  d/c actos 2)  same metformin 3)  ret 3 mos, when pt may, based on a1c, be ready for amaryl 4)  x ray right knee 5)  esr and uric acid 6)  ultracet as needed 7)  ismo 10/d    Prescriptions: ISOSORBIDE MONONITRATE 10 MG  TABS (ISOSORBIDE MONONITRATE) qd  #30 x 11   Entered and Authorized by:   Minus Breeding MD   Signed by:   Minus Breeding MD on 02/11/2008    Method used:   Electronically sent to ...       [facility]  [address] [id number]       [address]       [place], [place]  [address]       Ph: [phone] or [phone]       Fax: [phone]   RxID:   [id number] ULTRACET 37.5-325 MG  TABS (TRAMADOL-ACETAMINOPHEN) q4h as needed pain  #50 x 2   Entered and Authorized by:   Minus Breeding MD   Signed by:   Minus Breeding MD on 02/11/2008   Method used:   Electronically sent to ...       [facility]  [address] [id number]       [address]       [place], [place]  [address]       Ph: [phone] or [phone]       Fax: [phone]   RxID:   [id number]  ]

## 2010-12-11 NOTE — Progress Notes (Signed)
Summary: iron?  Phone Note Call from Patient Call back at [facility] Phone [phone]   Summary of Call: Patient came into office today for 1st B12 injection. Patient wanted to know if she should be on perscription strength Iron? I informed patient I would call her back once I had some information from the doctor. Patient verbalized understanding. Initial call taken by: Beola Cord, CMA,  June 07, 2009 8:51 AM  Follow-up for Phone Call        she should take OTC iron supplement two times a day at this time - dr. Everardo All can recheck levels in the future and if this dosing is not adequate, he can advise other treatment at followup. thanks Follow-up by: Newt Lukes MD,  June 07, 2009 9:37 AM  Additional Follow-up for Phone Call Additional follow up Details #1::        No, last iron was normal Additional Follow-up by: Corwin Levins MD,  June 07, 2009 10:40 AM    Additional Follow-up for Phone Call Additional follow up Details #2::    Patient aware per Dr. Everardo All. Patient verbalized her understanding. Follow-up by: Beola Cord, CMA,  June 07, 2009 1:10 PM

## 2010-12-11 NOTE — Letter (Signed)
Summary: Dr. Gretta Cool M.D.  Dr. Gretta Cool M.D.   Imported By: Sherian Rein 05/12/2009 11:51:32  _____________________________________________________________________  External Attachment:    Type:   Image     Comment:   External Document

## 2010-12-11 NOTE — Progress Notes (Signed)
Summary: CHEST PAIN X 2 DAYS   Phone Note Call from Patient Call back at Home Phone [phone]   Caller: Patient Reason for Call: Talk to Nurse Summary of Call: CHEST PAIN X 2 DAYS Initial call taken by: Burnard Leigh,  April 12, 2009 10:06 AM  Follow-up for Phone Call        per daughter patty [phone]. c [phone] Lorne Skeens  April 12, 2009 10:15 AM   Additional Follow-up for Phone Call Additional follow up Details #1::        RN Left Message To Call Back. Bernita Raisin, RN, BSN  April 12, 2009 11:01 AM Rn s/w Pt's daughter Alexia Freestone re: Pt having heartburn/indigestion. Patty asked RN to speak with other daughter Claris Che @ [phone] who is with the Pt at this time.  RN s/w Pt stated chest pain: reports she thought "she had gas in her chest" and burping spells.These pains occurred on and off yesterday. Denies having burping spells before; has been drinking warm/hot water to help. Pt reports she does feel better today but chest discomfort is still there. RN advised to go to ER for full evaluation. Additional Follow-up by: Bernita Raisin, RN, BSN,  April 12, 2009 11:01 AM

## 2010-12-11 NOTE — Letter (Signed)
Summary: Patient Notice- Polyp Results   Gastroenterology  [address] [place], [place] [address]   Phone: [phone]  Fax: [phone]        January 13, 2010 MRN: [id number]    [facility] [address] [place], [place]  [address]    Dear [name],  I am pleased to inform you that the colon polyp(s) removed during your recent colonoscopy was (were) found to be benign (no cancer detected) upon pathologic examination.The polyp adenomatous ( precancerous)  I recommend you have a repeat colonoscopy examination in _5 years to look for recurrent polyps, as having colon polyps increases your risk for having recurrent polyps or even colon cancer in the future.  Should you develop new or worsening symptoms of abdominal pain, bowel habit changes or bleeding from the rectum or bowels, please schedule an evaluation with either your primary care physician or with me.  Additional information/recommendations:  __x No further action with gastroenterology is needed at this time. Please      follow-up with your primary care physician for your other healthcare      needs.  __ Please call [phone] to schedule a return visit to review your      situation.  __ Please keep your follow-up visit as already scheduled.  __ Continue treatment plan as outlined the day of your exam.  Please call us if you are having persistent problems or have questions about your condition that have not been fully answered at this time.  Sincerely,  Hart Carwin MD  This letter has been electronically signed by your physician.  Appended Document: Patient Notice- Polyp Results Letter mailed 3.8.11

## 2010-12-11 NOTE — Letter (Signed)
Summary: Gretta Cool MD  Gretta Cool MD   Imported By: Lester Bloomingdale 05/17/2010 09:59:08  _____________________________________________________________________  External Attachment:    Type:   Image     Comment:   External Document

## 2010-12-11 NOTE — Miscellaneous (Signed)
Summary: BONE DENSITY  Clinical Lists Changes  Orders: Added new Test order of T-Lumbar Vertebral Assessment (77082) - Signed 

## 2010-12-11 NOTE — Assessment & Plan Note (Signed)
Summary: per daughter/dcmc 5/15/saf   Visit Type:  rov Referring Provider:  Romero Belling, MD Primary Provider:  Minus Breeding MD  CC:  palpitations some times....no other complaints today.  History of Present Illness: [name] comes in today for followup of her coronary artery disease.  She was just readmitted to the hospital for presyncope. I reviewed the discharge summary.  One of her AV node blocking or slowing drugs was stopped because of some bradycardia. Carotid Dopplers were negative. CT of the head was also negative for acute event.  Cardiac markers were negative. She did have significant iron deficiency anemia. She is now on supplemental iron. The aspirin at 325 mg is burning her stomach at times. She reports no bleeding or melena. She is now off of her Plavix.  She was noticed to time without breath female. She is still taking it.  Current Medications (verified): 1)  Toprol Xl 50 Mg Tb24 (Metoprolol Succinate) .... Take 1 Tablet By Mouth Once A Day 2)  Glucophage Xr 500 Mg  Tb24 (Metformin Hcl) .... 2 Tabs Two Times A Day 3)  Pravachol 40 Mg Tabs (Pravastatin Sodium) .... 2 Qhs 4)  B12 .[name].. 1000 Micrograms Q Month 5)  Januvia 100 Mg Tabs (Sitagliptin Phosphate) .[name].. 1 Qam 6)  Aspirin Ec 325 Mg Tbec (Aspirin) .... Take One Tablet By Mouth Daily 7)  Plavix 75 Mg Tabs (Clopidogrel Bisulfate) .[name].. 1 Tab Once Daily 8)  Nitrostat 0.4 Mg Subl (Nitroglycerin) .[name].. 1 Tablet Under Tongue At Onset of Chest Pain; You May Repeat Every 5 Minutes For Up To 3 Doses. 9)  Lisinopril-Hydrochlorothiazide 20-12.5 Mg Tabs (Lisinopril-Hydrochlorothiazide) .... 2 Tab Once Daily 10)  Pepcid Ac Maximum Strength 20 Mg Tabs (Famotidine) .[name].. 1 Tab Once Daily 11)  Loratadine-Pseudoephedrine 10-240 Mg Xr24h-Tab (Loratadine-Pseudoephedrine) .[name].. 1 Once Daily As Needed For Congestion 12)  Nu-Iron 150 Mg Caps (Polysaccharide Iron Complex) .[name].. 1 Cap Once Daily  Allergies: 1)  ! Sulfa 2)  ! Pcn 3)  !  Feldene 4)  ! Erythromycin 5)  ! Celebrex 6)  ! Macrobid 7)  ! * Decongestants 8)  ! Betadine  Past History:  Past Medical History: Last updated: 02/14/2010 PAROXYSMAL ATRIAL FIBRILLATION (ICD-427.31) CAD, NATIVE VESSEL (ICD-414.01) HYPERTENSION (ICD-401.9) DYSLIPIDEMIA (ICD-272.4) EDEMA (ICD-782.3) LEG PAIN, BILATERAL (ICD-729.5) PALPITATIONS (ICD-785.1) GERD (ICD-530.81) ANEMIA-B12 DEFICIENCY (ICD-281.1) OSTEOARTHRITIS (ICD-715.9) PERSONAL HX COLONIC POLYPS (ICD-V12.72) DIZZINESS AND GIDDINESS (ICD-780.4) ANEMIA, IRON DEFICIENCY (ICD-280.9) ABDOMINAL PAIN (ICD-789.00) ENCOUNTER FOR LONG-TERM USE OF OTHER MEDICATIONS (ICD-V58.69) PERNICIOUS ANEMIA (ICD-281.0) COUGH (ICD-786.2) ACUTE CYSTITIS (ICD-595.0) LUMBOSACRAL RADICULOPATHY (ICD-724.4) KNEE PAIN, RIGHT (ICD-719.46) CLIMACTERIC STATE, FEMALE (ICD-627.2) BACK PAIN, LUMBAR (ICD-724.2) GALLBLADDER DISEASE (ICD-575.9) DISC DISEASE, LUMBAR (ICD-722.52) CONSTIPATION NOS (ICD-564.00) ARTHRITIS (ICD-716.90) COLONIC POLYPS (ICD-211.3) ADENOCARCINOMA, COLON, FAMILY HX (ICD-V16.0) DIVERTICULITIS, HX OF (ICD-V12.79) DIABETES MELLITUS, TYPE II (ICD-250.00) ALLERGIC RHINITIS (ICD-477.9)  Past Surgical History: Last updated: 09/12/2009 Hysterectomy and incidental appendectomy(1974) Cholecystectomy (2003) Back surgery ( 1989)  Family History: Last updated: 08/18/2008 sister had breast and colon cancer daughter has breast cancer  Social History: Last updated: 01/01/2010 retired widowed Patient has never smoked.  Alcohol Use - no Daily Caffeine Use Illicit Drug Use - no  Risk Factors: Smoking Status: never (01/01/2010)  Review of Systems       negative other than history of present illness  Vital Signs:  Patient profile:   77 year old female Height:      62 inches Weight:      129 pounds BMI:     23.68 Pulse  rate:   74 / minute Pulse rhythm:   regular BP sitting:   110 / 70  (left arm) Cuff size:    large  Vitals Entered By: Danielle Rankin, CMA (Apr 05, 2010 9:57 AM)  Physical Exam  General:  Well developed, well nourished, in no acute distress. Head:  normocephalic and atraumatic Eyes:  PERRLA/EOM intact; conjunctiva and lids normal. Neck:  Neck supple, no JVD. No masses, thyromegaly or abnormal cervical nodes. Chest Carly Applegate:  no deformities or breast masses noted Lungs:  Clear bilaterally to auscultation and percussion. Heart:  regular rate and rhythm, soft systolic murmur, S2 splits, Msk:  Back normal, normal gait. Muscle strength and tone normal. Pulses:  pulses normal in all 4 extremities Extremities:  No clubbing or cyanosis. Neurologic:  Alert and oriented x 3. Skin:  Intact without lesions or rashes. Psych:  Normal affect.   EKG  Procedure date:  04/05/2010  Findings:      normal sinus rhythm, no changes.  Impression & Recommendations:  Problem # 1:  SYNCOPE (ICD-780.2) Her syncope is not recurred. Her heart rates down in the 70s with a normal EKG. She'll has mild aortic stenosis. She was not orthostatic in the hospital but I have gone over orthostatic precautions. She was not supposed to be on verapamil after leaving the hospital. We will discontinue it. The following medications were removed from the medication list:    Verapamil Hcl Cr 240 Mg Tbcr (Verapamil hcl) .[name]... Take 1 by mouth qd Her updated medication list for this problem includes:    Toprol Xl 50 Mg Tb24 (Metoprolol succinate) .[name]... Take 1 tablet by mouth once a day    Aspirin Ec 325 Mg Tbec (Aspirin) .[name]... Take one tablet by mouth daily    Plavix 75 Mg Tabs (Clopidogrel bisulfate) .[name]... 1 tab once daily    Nitrostat 0.4 Mg Subl (Nitroglycerin) .[name]... 1 tablet under tongue at onset of chest pain; you may repeat every 5 minutes for up to 3 doses.    Lisinopril-hydrochlorothiazide 20-12.5 Mg Tabs (Lisinopril-hydrochlorothiazide) .[name]... 2 tab once daily  Problem # 2:  PAROXYSMAL ATRIAL FIBRILLATION  (ICD-427.31) Assessment: Improved  Her updated medication list for this problem includes:    Toprol Xl 50 Mg Tb24 (Metoprolol succinate) .[name]... Take 1 tablet by mouth once a day    Aspirin Ec 325 Mg Tbec (Aspirin) .[name]... Take one tablet by mouth daily    Plavix 75 Mg Tabs (Clopidogrel bisulfate) .[name]... 1 tab once daily  Orders: EKG w/ Interpretation (93000)  Her updated medication list for this problem includes:    Toprol Xl 50 Mg Tb24 (Metoprolol succinate) .[name]... Take 1 tablet by mouth once a day    Aspirin Ec 325 Mg Tbec (Aspirin) .[name]... Take one tablet by mouth daily    Plavix 75 Mg Tabs (Clopidogrel bisulfate) .[name]... 1 tab once daily  Problem # 3:  HYPERTENSION (ICD-401.9) Assessment: Improved  The following medications were removed from the medication list:    Verapamil Hcl Cr 240 Mg Tbcr (Verapamil hcl) .[name]... Take 1 by mouth qd Her updated medication list for this problem includes:    Toprol Xl 50 Mg Tb24 (Metoprolol succinate) .[name]... Take 1 tablet by mouth once a day    Aspirin Ec 325 Mg Tbec (Aspirin) .[name]... Take one tablet by mouth daily    Lisinopril-hydrochlorothiazide 20-12.5 Mg Tabs (Lisinopril-hydrochlorothiazide) .[name]... 2 tab once daily  Her updated medication list for this problem includes:    Toprol Xl 50 Mg Tb24 (  Metoprolol succinate) .[name]... Take 1 tablet by mouth once a day    Verapamil Hcl Cr 240 Mg Tbcr (Verapamil hcl) .[name]... Take 1 by mouth qd    Aspirin Ec 325 Mg Tbec (Aspirin) .[name]... Take one tablet by mouth daily    Lisinopril-hydrochlorothiazide 20-12.5 Mg Tabs (Lisinopril-hydrochlorothiazide) .[name]... 2 tab once daily  Problem # 4:  CAD, NATIVE VESSEL (ICD-414.01) Assessment: Unchanged Because the aspirin as burning in her stomach, we'll cut back to 81 mg a day. She reports no bleeding or melena. The following medications were removed from the medication list:    Verapamil Hcl Cr 240 Mg Tbcr (Verapamil hcl) .[name]... Take 1 by mouth qd Her updated medication list for  this problem includes:    Toprol Xl 50 Mg Tb24 (Metoprolol succinate) .[name]... Take 1 tablet by mouth once a day    Aspirin Ec 325 Mg Tbec (Aspirin) .[name]... Take one tablet by mouth daily    Plavix 75 Mg Tabs (Clopidogrel bisulfate) .[name]... 1 tab once daily    Nitrostat 0.4 Mg Subl (Nitroglycerin) .[name]... 1 tablet under tongue at onset of chest pain; you may repeat every 5 minutes for up to 3 doses.    Lisinopril-hydrochlorothiazide 20-12.5 Mg Tabs (Lisinopril-hydrochlorothiazide) .[name]... 2 tab once daily  Her updated medication list for this problem includes:    Toprol Xl 50 Mg Tb24 (Metoprolol succinate) .[name]... Take 1 tablet by mouth once a day    Verapamil Hcl Cr 240 Mg Tbcr (Verapamil hcl) .[name]... Take 1 by mouth qd    Aspirin Ec 325 Mg Tbec (Aspirin) .[name]... Take one tablet by mouth daily    Plavix 75 Mg Tabs (Clopidogrel bisulfate) .[name]... 1 tab once daily    Nitrostat 0.4 Mg Subl (Nitroglycerin) .[name]... 1 tablet under tongue at onset of chest pain; you may repeat every 5 minutes for up to 3 doses.    Lisinopril-hydrochlorothiazide 20-12.5 Mg Tabs (Lisinopril-hydrochlorothiazide) .[name]... 2 tab once daily  Patient Instructions: 1)  Your physician recommends that you schedule a follow-up appointment in: 4 MONTHS WITH DR Yug Loria 2)  Your physician has recommended you make the following change in your medication: STOP VERAPAMIL AND DECREASE  ASPIRIN TO 81 MG once daily

## 2010-12-11 NOTE — Assessment & Plan Note (Signed)
Summary: eph/ gd   Referring Provider:  Romero Belling, MD Primary Provider:  Minus Breeding MD   History of Present Illness: [name] comes in today for evaluation management of her coronary artery disease. He presented with unstable angina on 23 March. She awoke from sleep and had some heaviness in her chest radiating to her left arm.  She was found to have a 95% proximal LAD. This was stented with a bare-metal stent. She was advised to stay on Plavix and aspirin for one month then change to aspirin.  She's had no further angina. There's been no bleeding including melena.  She denies any palpitations, presyncope, peripheral edema.  Current Medications (verified): 1)  Toprol Xl 50 Mg Tb24 (Metoprolol Succinate) .... Take 1 Tablet By Mouth Once A Day 2)  Verapamil Hcl Cr 240 Mg Tbcr (Verapamil Hcl) .... Take 1 By Mouth Qd 3)  Glucophage Xr 500 Mg  Tb24 (Metformin Hcl) .... 2 Tabs Two Times A Day 4)  Pravachol 40 Mg Tabs (Pravastatin Sodium) .... 2 Qhs 5)  B12 .[name].. 1000 Micrograms Q Month 6)  Lactulose 10 Gm/83ml Soln (Lactulose) .... 3 Tablespoons Bid 7)  Januvia 100 Mg Tabs (Sitagliptin Phosphate) .[name].. 1 Qam 8)  Aspirin Ec 325 Mg Tbec (Aspirin) .... Take One Tablet By Mouth Daily 9)  Plavix 75 Mg Tabs (Clopidogrel Bisulfate) .[name].. 1 Tab Once Daily 10)  Nitrostat 0.4 Mg Subl (Nitroglycerin) .[name].. 1 Tablet Under Tongue At Onset of Chest Pain; You May Repeat Every 5 Minutes For Up To 3 Doses. 11)  Lisinopril-Hydrochlorothiazide 20-12.5 Mg Tabs (Lisinopril-Hydrochlorothiazide) .... 2 Tab Once Daily  Allergies: 1)  ! Sulfa 2)  ! Pcn 3)  ! Feldene 4)  ! Erythromycin 5)  ! Celebrex 6)  ! Macrobid 7)  ! * Decongestants  Past History:  Past Surgical History: Last updated: 09/12/2009 Hysterectomy and incidental appendectomy(1974) Cholecystectomy (2003) Back surgery ( 1989)  Review of Systems       negative other than history of present illness  Vital Signs:  Patient profile:    77 year old female Height:      62 inches Weight:      131 pounds BMI:     24.05 Pulse rate:   63 / minute Pulse rhythm:   irregular BP sitting:   128 / 60  (left arm) Cuff size:   large  Vitals Entered By: Danielle Rankin, CMA (February 16, 2010 11:15 AM)  Physical Exam  General:  Well developed, well nourished, in no acute distress. Head:  normocephalic and atraumatic Eyes:  PERRLA/EOM intact; conjunctiva and lids normal. Neck:  Neck supple, no JVD. No masses, thyromegaly or abnormal cervical nodes. Chest [name]:  no deformities or breast masses noted Lungs:  Clear bilaterally to auscultation and percussion. Heart:  Non-displaced PMI, chest non-tender; regular rate and rhythm, S1, S2 without murmurs, rubs or gallops. Carotid upstroke normal, no bruit. Normal abdominal aortic size, no bruits. Femorals normal pulses, no bruits. Pedals normal pulses. No edema, no varicosities. Msk:  Back normal, normal gait. Muscle strength and tone normal. Pulses:  pulses normal in all 4 extremities Extremities:  No clubbing or cyanosis. Neurologic:  Alert and oriented x 3. Skin:  Intact without lesions or rashes. Psych:  Normal affect.   EKG  Procedure date:  02/16/2010  Findings:      normal sinus rhythm first grade block stable STs  Impression & Recommendations:  Problem # 1:  CAD, NATIVE VESSEL (ICD-414.01) Assessment Improved She  can stop her Plavix after the first month.. She is paying $220 a month by the way. She'll continue with aspirin. I reviewed at length how to use supplemental nitroglycerin how to activate 911. I will see her back in 3 months. The following medications were removed from the medication list:    Zestoretic 20-12.5 Mg Tabs (Lisinopril-hydrochlorothiazide) .[name]... Take 2 by mouth qd Her updated medication list for this problem includes:    Toprol Xl 50 Mg Tb24 (Metoprolol succinate) .[name]... Take 1 tablet by mouth once a day    Verapamil Hcl Cr 240 Mg Tbcr (Verapamil hcl)  .[name]... Take 1 by mouth qd    Aspirin Ec 325 Mg Tbec (Aspirin) .[name]... Take one tablet by mouth daily    Plavix 75 Mg Tabs (Clopidogrel bisulfate) .[name]... 1 tab once daily    Nitrostat 0.4 Mg Subl (Nitroglycerin) .[name]... 1 tablet under tongue at onset of chest pain; you may repeat every 5 minutes for up to 3 doses.    Lisinopril-hydrochlorothiazide 20-12.5 Mg Tabs (Lisinopril-hydrochlorothiazide) .[name]... 2 tab once daily  Problem # 2:  HYPERTENSION (ICD-401.9) Assessment: Improved  The following medications were removed from the medication list:    Zestoretic 20-12.5 Mg Tabs (Lisinopril-hydrochlorothiazide) .[name]... Take 2 by mouth qd Her updated medication list for this problem includes:    Toprol Xl 50 Mg Tb24 (Metoprolol succinate) .[name]... Take 1 tablet by mouth once a day    Verapamil Hcl Cr 240 Mg Tbcr (Verapamil hcl) .[name]... Take 1 by mouth qd    Aspirin Ec 325 Mg Tbec (Aspirin) .[name]... Take one tablet by mouth daily    Lisinopril-hydrochlorothiazide 20-12.5 Mg Tabs (Lisinopril-hydrochlorothiazide) .[name]... 2 tab once daily  Problem # 3:  DYSLIPIDEMIA (ICD-272.4) Assessment: Unchanged  Her updated medication list for this problem includes:    Pravachol 40 Mg Tabs (Pravastatin sodium) .[name]... 2 qhs  Other Orders: EKG w/ Interpretation (93000)  Patient Instructions: 1)  Your physician recommends that you schedule a follow-up appointment in: 3 months with dr Shyhiem Beeney 2)  Your physician has recommended you make the following change in your medication: may stop plavix when runs out cont aspirin therapy

## 2010-12-11 NOTE — Progress Notes (Signed)
Summary: Medication change request  Phone Note Call from Patient Call back at Home Phone [phone] Call back at [facility] VM ON HM #   Caller: Daughter - Patty Call For: Minus Breeding MD Summary of Call: Daughter left message that the medication [facility] the [facility] is too expensive and wants to know if there is a cheapter alternative. Initial call taken by: Irma Newness,  September 22, 2009 1:41 PM  Follow-up for Phone Call        Lactulose is too expensive [phone]). Pt would like alternative Follow-up by: Lamar Sprinkles, CMA,  September 22, 2009 1:54 PM  Additional Follow-up for Phone Call Additional follow up Details #1::        there is a cheaper alternative, but it can cause your blood sugar to go low, so we would have to then keep your blood sugar high for safety.  if you cannot buy the [place], just take the metformin. Additional Follow-up by: Minus Breeding MD,  September 22, 2009 2:54 PM    Additional Follow-up for Phone Call Additional follow up Details #2::    Daughter said the medication that was too expensive was Lactulose given at the 11/02 office visit. Cost for med is 66$. ...................................Marland KitchenLamar Sprinkles, CMA  September 22, 2009 5:29 PM   Additional Follow-up for Phone Call Additional follow up Details #3:: Details for Additional Follow-up Action Taken: instead, take non-prescription miralax Additional Follow-up by: Minus Breeding MD,  September 23, 2009 2:12 PM  pt informed

## 2010-12-11 NOTE — Assessment & Plan Note (Signed)
Summary: B12 SAE--STC  Nurse Visit   Allergies: 1)  ! Sulfa 2)  ! Pcn 3)  ! Feldene 4)  ! Erythromycin 5)  ! Celebrex 6)  ! Macrobid 7)  ! * Decongestants  Medication Administration  Injection # 1:    Medication: Vit B12 1000 mcg    Diagnosis: UNSPECIFIED ANEMIA (ICD-285.9)    Route: IM    Site: L deltoid    Exp Date: 02/10/2011    Lot #: [id number]    Mfr: American Regent    Patient tolerated injection without complications    Given by: Margaret Pyle, CMA (August 08, 2009 8:37 AM)  Orders Added: 1)  Admin of Therapeutic Inj  intramuscular or subcutaneous [96372] 2)  Vit B12 1000 mcg [J3420]

## 2010-12-11 NOTE — Assessment & Plan Note (Signed)
Summary: COLD/SORE THROAT /NWS $50   Vital Signs:  Patient Profile:   77 Years Old Female Height:     62 inches Weight:      126.4 pounds O2 Sat:      97 % O2 treatment:    Room Air Temp:     100.6 degrees F oral Pulse rate:   88 / minute BP sitting:   132 / 78  (left arm) Cuff size:   regular  Pt. in pain?   no  Vitals Entered By: Orlan Leavens (December 13, 2008 11:03 AM)                  Chief Complaint:  COUGH/ SORE THROAT/ NASAL DRAINAGE.  History of Present Illness: 1 week nasal congestion, and associated rhinorrhea.  also prod cough.  she didn't notice fever until today    Prior Medications Reviewed Using: Patient Recall  Updated Prior Medication List: TOPROL XL 50 MG TB24 (METOPROLOL SUCCINATE) Take 1 tablet by mouth once a day VERAPAMIL HCL CR 240 MG TBCR (VERAPAMIL HCL) TAKE 1 by mouth QD GLUCOPHAGE XR 500 MG  TB24 (METFORMIN HCL) 2-bid ZESTORETIC 20-12.5 MG  TABS (LISINOPRIL-HYDROCHLOROTHIAZIDE) TAKE 2 by mouth QD ULTRACET 37.5-325 MG  TABS (TRAMADOL-ACETAMINOPHEN) q4h as needed pain ISOSORBIDE MONONITRATE 10 MG  TABS (ISOSORBIDE MONONITRATE) qd PRAVACHOL 40 MG TABS (PRAVASTATIN SODIUM) 2 qhs  Current Allergies (reviewed today): ! SULFA ! PCN ! FELDENE ! ERYTHROMYCIN ! CELEBREX ! MACROBID ! * DECONGESTANTS  Past Medical History:    Reviewed history from 08/11/2008 and no changes required:       BACK PAIN, LUMBAR (ICD-724.2)       GALLBLADDER DISEASE (ICD-575.9)       DISC DISEASE, LUMBAR (ICD-722.52)       CONSTIPATION NOS (ICD-564.00)       ABDOMINAL PAIN, CHRONIC (ICD-789.00)       DYSLIPIDEMIA, rx limited by perceived intolerance to statins (ICD-272.4)       ARTHRITIS (ICD-716.90)       COLONIC POLYPS (ICD-211.3)       ADENOCARCINOMA, COLON, FAMILY HX (ICD-V16.0)       HYPERTENSION (ICD-401.9)       DIVERTICULITIS, HX OF (ICD-V12.79)       DIABETES MELLITUS, TYPE II (ICD-250.00)       ALLERGIC RHINITIS (ICD-477.9)     Review of  Systems  The patient denies dyspnea on exertion and abdominal pain.         denies rash n/v, and dysuria   Physical Exam  General:     normal appearance.   Head:     head: no deformity eyes: no periorbital swelling, np proptosis mouth: no lesion seen external nose and ears are normal  Ears:     TMs intact and clear with normal canals and hearing Neck:     no masses, thyromegaly, or abnormal cervical nodes Lungs:     clear to auscultation.  no respiratory distress  Additional Exam:     CHEST - 2 VIEW  No active cardiopulmonary disease.      Impression & Recommendations:  Problem # 1:  COUGH (ICD-786.2) due to acute bronchitis   Patient Instructions: 1)  levaquin 1/2 of 500 mg daily x 5d (samples given). 2)  call 2d if fever persists

## 2010-12-11 NOTE — Letter (Signed)
Summary: Patient [facility] Biopsy Results  [facility]  [address] [place], [place] [address]   Phone: [phone]  Fax: [phone]        January 13, 2010 MRN: [id number]    [facility] [address] [name], [place]  [address]    Dear Ms. Sarwar,  I am pleased to inform you that the biopsies taken during your recent endoscopic examination did not show any evidence of cancer upon pathologic examination.The biopsy of Your duodenum showed normal tissue  Additional information/recommendations:  _x_No further action is needed at this time.  Please follow-up with      your primary care physician for your other healthcare needs.  __ Please call [phone] to schedule a return visit to review      your condition.  __ Continue with the treatment plan as outlined on the day of your      exam.  _   Please call us if you are having persistent problems or have questions about your condition that have not been fully answered at this time.  Sincerely,  Hart Carwin MD  This letter has been electronically signed by your physician.  Appended Document: Patient Notice-Endo Biopsy Results Letter mailed 3.8.11

## 2010-12-11 NOTE — Assessment & Plan Note (Signed)
Summary: LEG  PAIN /$50 Natale Milch   Vital Signs:  Patient Profile:   77 Years Old Female Height:     62 inches Weight:      123.0 pounds O2 Sat:      97 % O2 treatment:    Room Air Temp:     97.5 degrees F oral Pulse rate:   74 / minute BP sitting:   120 / 62  (left arm) Cuff size:   regular  Pt. in pain?   yes    Location:   both legs    Intensity:   4  Vitals Entered By: Orlan Leavens (December 19, 2008 10:51 AM)                  Chief Complaint:  Legs pain.  History of Present Illness: pt states few days of crampy-quality pain at both calf areas.  unable to cite precip factor such as local injury.    Prior Medications Reviewed Using: Patient Recall  Prior Medication List:  TOPROL XL 50 MG TB24 (METOPROLOL SUCCINATE) Take 1 tablet by mouth once a day VERAPAMIL HCL CR 240 MG TBCR (VERAPAMIL HCL) TAKE 1 by mouth QD GLUCOPHAGE XR 500 MG  TB24 (METFORMIN HCL) 2-bid ZESTORETIC 20-12.5 MG  TABS (LISINOPRIL-HYDROCHLOROTHIAZIDE) TAKE 2 by mouth QD ULTRACET 37.5-325 MG  TABS (TRAMADOL-ACETAMINOPHEN) q4h as needed pain ISOSORBIDE MONONITRATE 10 MG  TABS (ISOSORBIDE MONONITRATE) qd PRAVACHOL 40 MG TABS (PRAVASTATIN SODIUM) 2 qhs   Updated Prior Medication List: TOPROL XL 50 MG TB24 (METOPROLOL SUCCINATE) Take 1 tablet by mouth once a day VERAPAMIL HCL CR 240 MG TBCR (VERAPAMIL HCL) TAKE 1 by mouth QD GLUCOPHAGE XR 500 MG  TB24 (METFORMIN HCL) 2-bid ZESTORETIC 20-12.5 MG  TABS (LISINOPRIL-HYDROCHLOROTHIAZIDE) TAKE 2 by mouth QD ULTRACET 37.5-325 MG  TABS (TRAMADOL-ACETAMINOPHEN) q4h as needed pain ISOSORBIDE MONONITRATE 10 MG  TABS (ISOSORBIDE MONONITRATE) qd PRAVACHOL 40 MG TABS (PRAVASTATIN SODIUM) 2 qhs  Current Allergies (reviewed today): ! SULFA ! PCN ! FELDENE ! ERYTHROMYCIN ! CELEBREX ! MACROBID ! * DECONGESTANTS  Past Medical History:    Reviewed history from 08/11/2008 and no changes required:       BACK PAIN, LUMBAR (ICD-724.2)       GALLBLADDER DISEASE  (ICD-575.9)       DISC DISEASE, LUMBAR (ICD-722.52)       CONSTIPATION NOS (ICD-564.00)       ABDOMINAL PAIN, CHRONIC (ICD-789.00)       DYSLIPIDEMIA, rx limited by perceived intolerance to statins (ICD-272.4)       ARTHRITIS (ICD-716.90)       COLONIC POLYPS (ICD-211.3)       ADENOCARCINOMA, COLON, FAMILY HX (ICD-V16.0)       HYPERTENSION (ICD-401.9)       DIVERTICULITIS, HX OF (ICD-V12.79)       DIABETES MELLITUS, TYPE II (ICD-250.00)       ALLERGIC RHINITIS (ICD-477.9)     Review of Systems  The patient denies chest pain and dyspnea on exertion.     Physical Exam  General:     no distress Msk:     gait normal but painful. Pulses:     dorsalis pedis intact bilat.   Extremities:     no deformity.  no ulcer on the feet.  feet are of normal color and temp.  legs are nontender. has varicosities, right > left ankles. trace left pedal edema and trace right pedal edema.   Neurologic:     sensation is intact to  touch on the feet.    Impression & Recommendations:  Problem # 1:  LEG PAIN, BILATERAL (ICD-729.5) uncertain etiology   Medications Added to Medication List This Visit: 1)  Methocarbamol 500 Mg Tabs (Methocarbamol) .[name].. 1 qhs   Patient Instructions: 1)  stat venous dopplers 2)  trial of robaxin 500 mg each night 3)  ret prn   Prescriptions: METHOCARBAMOL 500 MG TABS (METHOCARBAMOL) 1 qhs  #30 x 11   Entered and Authorized by:   Minus Breeding MD   Signed by:   Minus Breeding MD on 12/19/2008   Method used:   [facility]  [address] [id number]       [address]       [place], [place]  [address]       Ph: [phone] or [phone]       Fax: [phone]   RxID:   [id number]

## 2010-12-11 NOTE — Progress Notes (Signed)
Summary: normal venous doppler  Phone Note Outgoing Call   Call placed by: Orlan Leavens,  December 26, 2008 9:33 AM Call placed to: [facility] ON PHONE-TREE Summary of Call: Md recieved report from Vascular lab concerning Venous doppler on her leg. Report was normal. Left msg on Pt Initial call taken by: Orlan Leavens,  December 26, 2008 9:34 AM

## 2010-12-11 NOTE — Procedures (Signed)
Summary: Upper Endoscopy  Patient: [name] Note: All result statuses are Final unless otherwise noted.  Tests: (1) Upper Endoscopy (EGD)   EGD Upper Endoscopy       DONE     [facility]     [address]     [place], [place]  [address]           ENDOSCOPY PROCEDURE REPORT           PATIENT:  [name], Andrea  MR#:  [id number]     BIRTHDATE:  [birth year], 85 yrs. old  GENDER:  female           ENDOSCOPIST:  Hedwig Morton. Juanda Chance, MD     Referred by:  Cleophas Dunker Everardo All, M.D.           PROCEDURE DATE:  01/10/2010     PROCEDURE:  EGD with biopsy     ASA CLASS:  Class II     INDICATIONS:  iron deficiency anemia iron sat 8%,, HGb 10.2,     ,B12 defic,heme negative stool           MEDICATIONS:   Versed 3 mg, Fentanyl 25 mcg     TOPICAL ANESTHETIC:  Exactacain Spray           DESCRIPTION OF PROCEDURE:   After the risks benefits and     alternatives of the procedure were thoroughly explained, informed     consent was obtained.  The LB [id number] endoscope was     introduced through the mouth and advanced to the second portion of     the duodenum, without limitations.  The instrument was slowly     withdrawn as the mucosa was fully examined.     <<PROCEDUREIMAGES>>           The upper, middle, and distal third of the esophagus were     carefully inspected and no abnormalities were noted. The z-line     was well seen at the GEJ. The endoscope was pushed into the fundus     which was normal including a retroflexed view. The antrum,gastric     body, first and second part of the duodenum were unremarkable.     With standard forceps, a biopsy was obtained and sent to pathology     (see image1, image2, image3, image4, image5, and image6). small     bowl biopsy to r/o villous atrophy    Retroflexed views revealed     no abnormalities.    The scope was then withdrawn from the patient     and the procedure completed.           COMPLICATIONS:  None           ENDOSCOPIC  IMPRESSION:     1) Normal EGD     s/p small bowl biopsy     RECOMMENDATIONS:     1) Await biopsy results     colonoscopy     nothing to account for GI blood loss           REPEAT EXAM:  In 0 year(s) for.           ______________________________     Hedwig Morton. Juanda Chance, MD           CC:           n.     eSIGNED:   Hedwig Morton. Brodie at 01/10/2010 10:33 AM           [place],  [place], [phone]  Note: An exclamation mark (!) indicates a result that was not dispersed into the flowsheet. Document Creation Date: 01/10/2010 10:34 AM _______________________________________________________________________  (1) Order result status: Final Collection or observation date-time: 01/10/2010 09:59 Requested date-time:  Receipt date-time:  Reported date-time:  Referring Physician:   Ordering Physician: Lina Sar [phone]) Specimen Source:  Source: Launa Grill Order Number: [id number] Lab site:   Appended Document: Upper Endoscopy no recall EGD

## 2010-12-11 NOTE — Miscellaneous (Signed)
Summary: [facility] Physician Referral/Treatment Plan  [facility] Referral/Treatment Plan   Imported By: Roderic Ovens 02/21/2010 10:35:23  _____________________________________________________________________  External Attachment:    Type:   Image     Comment:   External Document

## 2010-12-11 NOTE — Assessment & Plan Note (Signed)
Summary: FU ON TEST/NWS   Vital Signs:  Patient profile:   77 year old female Height:      62 inches (157.48 cm) Weight:      131 pounds (59.55 kg) O2 Sat:      97 % on Room air Temp:     96.9 degrees F (36.06 degrees C) oral Pulse rate:   66 / minute BP sitting:   158 / 72  (left arm) Cuff size:   regular  Vitals Entered By: Josph Macho CMA (September 18, 2009 10:34 AM)  O2 Flow:  Room air CC: Follow-up visit on tests/ CF Is Patient Diabetic? Yes   Primary Provider:  Minus Breeding MD  CC:  Follow-up visit on tests/ CF.  History of Present Illness: pt went off metformin for her ct, and feels this dicontinuation has significantly reduced her abdominal pain.   no cbg record, but states cbg's are well-controlled.  Current Medications (verified): 1)  Toprol Xl 50 Mg Tb24 (Metoprolol Succinate) .... Take 1 Tablet By Mouth Once A Day 2)  Verapamil Hcl Cr 240 Mg Tbcr (Verapamil Hcl) .... Take 1 By Mouth Qd 3)  Glucophage Xr 500 Mg  Tb24 (Metformin Hcl) .... 2-Bid 4)  Zestoretic 20-12.5 Mg  Tabs (Lisinopril-Hydrochlorothiazide) .... Take 2 By Mouth Qd 5)  Ultracet 37.5-325 Mg  Tabs (Tramadol-Acetaminophen) .... Q4h As Needed Pain 6)  Pravachol 40 Mg Tabs (Pravastatin Sodium) .... 2 Qhs 7)  Methocarbamol 500 Mg Tabs (Methocarbamol) .[name].. 1 Qhs 8)  B12 .[name].. 1000 Micrograms Q Month 9)  Lactulose 10 Gm/73ml Soln (Lactulose) .... 3 Tablespoons Bid  Allergies (verified): 1)  ! Sulfa 2)  ! Pcn 3)  ! Feldene 4)  ! Erythromycin 5)  ! Celebrex 6)  ! Macrobid 7)  ! * Decongestants  Past History:  Past Medical History: Last updated: 08/21/2009 DYSLIPIDEMIA, rx limited by perceived intolerance to statins (ICD-272.4) ENCOUNTER FOR LONG-TERM USE OF OTHER MEDICATIONS (ICD-V58.69) PERNICIOUS ANEMIA (ICD-281.0) UNSPECIFIED ANEMIA (ICD-285.9) PALPITATIONS (ICD-785.1) LEG PAIN, BILATERAL (ICD-729.5) COUGH (ICD-786.2) ACUTE CYSTITIS (ICD-595.0) LUMBOSACRAL RADICULOPATHY  (ICD-724.4) KNEE PAIN, RIGHT (ICD-719.46) EDEMA (ICD-782.3) CLIMACTERIC STATE, FEMALE (ICD-627.2) BACK PAIN, LUMBAR (ICD-724.2) GALLBLADDER DISEASE (ICD-575.9) DISC DISEASE, LUMBAR (ICD-722.52) CONSTIPATION NOS (ICD-564.00) ABDOMINAL PAIN, CHRONIC (ICD-789.00) ARTHRITIS (ICD-716.90) COLONIC POLYPS (ICD-211.3) ADENOCARCINOMA, COLON, FAMILY HX (ICD-V16.0) HYPERTENSION (ICD-401.9) DIVERTICULITIS, HX OF (ICD-V12.79) DIABETES MELLITUS, TYPE II (ICD-250.00) ALLERGIC RHINITIS (ICD-477.9)  Review of Systems       denies n/v  Physical Exam  General:  Well developed, well nourished, in no acute distress.  Msk:  gait is normal and steady   Impression & Recommendations:  Problem # 1:  ABDOMINAL PAIN (ICD-789.00) ? related to metformin  Problem # 2:  DIABETES MELLITUS, TYPE II (ICD-250.00)  Medications Added to Medication List This Visit: 1)  Glucophage Xr 500 Mg Tb24 (Metformin hcl) .[name].. 1-bid 2)  Januvia 100 Mg Tabs (Sitagliptin phosphate) .[name].. 1 qam  Other Orders: Est. Patient Level III ([address])  Patient Instructions: 1)  Please schedule a follow-up appointment in 3 months. 2)  reduce metformin to 500 mg two times a day 3)  add januvia 100 mg once daily.

## 2010-12-11 NOTE — Assessment & Plan Note (Signed)
Summary: 3 mth fu---$50---stc   Vital Signs:  Patient Profile:   77 Years Old Female Height:     62 inches Weight:      127.2 pounds O2 Sat:      96 % O2 treatment:    Room Air Temp:     97.0 degrees F oral Pulse rate:   91 / minute BP sitting:   132 / 60  (left arm) Cuff size:   regular  Pt. in pain?   no  Vitals Entered By: Orlan Leavens (November 16, 2008 8:00 AM)                  Chief Complaint:  3 month follow-up.  History of Present Illness: few d low-back pain.  unable to cite precip factor such as local injury.    Prior Medications Reviewed Using: Patient Recall  Updated Prior Medication List: TOPROL XL 50 MG TB24 (METOPROLOL SUCCINATE) Take 1 tablet by mouth once a day VERAPAMIL HCL CR 240 MG TBCR (VERAPAMIL HCL) TAKE 1 by mouth QD GLUCOPHAGE XR 500 MG  TB24 (METFORMIN HCL) 2-bid ZESTORETIC 20-12.5 MG  TABS (LISINOPRIL-HYDROCHLOROTHIAZIDE) TAKE 2 by mouth QD ULTRACET 37.5-325 MG  TABS (TRAMADOL-ACETAMINOPHEN) q4h as needed pain ISOSORBIDE MONONITRATE 10 MG  TABS (ISOSORBIDE MONONITRATE) qd PRAVACHOL 40 MG TABS (PRAVASTATIN SODIUM) 2 qhs  Current Allergies (reviewed today): ! SULFA ! PCN ! FELDENE ! ERYTHROMYCIN ! CELEBREX ! MACROBID ! * DECONGESTANTS  Past Medical History:    Reviewed history from 08/11/2008 and no changes required:       BACK PAIN, LUMBAR (ICD-724.2)       GALLBLADDER DISEASE (ICD-575.9)       DISC DISEASE, LUMBAR (ICD-722.52)       CONSTIPATION NOS (ICD-564.00)       ABDOMINAL PAIN, CHRONIC (ICD-789.00)       DYSLIPIDEMIA, rx limited by perceived intolerance to statins (ICD-272.4)       ARTHRITIS (ICD-716.90)       COLONIC POLYPS (ICD-211.3)       ADENOCARCINOMA, COLON, FAMILY HX (ICD-V16.0)       HYPERTENSION (ICD-401.9)       DIVERTICULITIS, HX OF (ICD-V12.79)       DIABETES MELLITUS, TYPE II (ICD-250.00)       ALLERGIC RHINITIS (ICD-477.9)     Review of Systems  The patient denies fever.         denies  numbness   Physical Exam  General:     well developed, well nourished, in no acute distress Msk:     back is nontender    Impression & Recommendations:  Problem # 1:  BACK PAIN, LUMBAR (ICD-724.2) uncertain etiology.  possibly due to uti   Medications Added to Medication List This Visit: 1)  Ciprofloxacin Hcl 250 Mg Tabs (Ciprofloxacin hcl) .... Bid  Other Orders: UA Dipstick w/o Micro (manual) ([address]) T-Culture, Urine ([address]454-09811) Est. Patient Level III (91478)   Patient Instructions: 1)  trial of cipro 250 two times a day 2)  ret 6 mos 3)  same meds   Prescriptions: CIPROFLOXACIN HCL 250 MG TABS (CIPROFLOXACIN HCL) bid  #14 x 0   Entered and Authorized by:   Minus Breeding MD   Signed by:   Minus Breeding MD on 11/16/2008   Method used:   Electronically to        [facility] (retail)       [address]       [place]  [place], [place]  [address]       Ph: [phone]       Fax: [phone]   RxID:   [id number]  ] Laboratory Results   Urine Tests    Routine Urinalysis   Color: lt. yellow Appearance: Clear Glucose: negative   (Normal Range: Negative) Bilirubin: negative   (Normal Range: Negative) Ketone: negative   (Normal Range: Negative) Spec. Gravity: <1.005   (Normal Range: 1.003-1.035) Blood: moderate   (Normal Range: Negative) pH: 5.0   (Normal Range: 5.0-8.0) Protein: negative   (Normal Range: Negative) Urobilinogen: 0.2   (Normal Range: 0-1) Nitrite: negative   (Normal Range: Negative) Leukocyte Esterace: small   (Normal Range: Negative)

## 2010-12-11 NOTE — Assessment & Plan Note (Signed)
Summary: B12/SAE/CD  Nurse Visit   Vitals Entered By: Windell Norfolk (July 05, 2009 8:36 AM)  Allergies: 1)  ! Sulfa 2)  ! Pcn 3)  ! Feldene 4)  ! Erythromycin 5)  ! Celebrex 6)  ! Macrobid 7)  ! * Decongestants  Medication Administration  Injection # 1:    Medication: Vit B12 1000 mcg    Diagnosis: PERNICIOUS ANEMIA (ICD-281.0)    Route: IM    Site: L deltoid    Exp Date: 03/12/2011    Lot #: [id number]    Mfr: American Regent    Patient tolerated injection without complications    Given by: Windell Norfolk (July 05, 2009 8:37 AM)  Orders Added: 1)  Vit B12 1000 mcg [J3420] 2)  Admin of Therapeutic Inj  intramuscular or subcutaneous [96045]

## 2010-12-11 NOTE — Letter (Signed)
Summary: Diabetic Instructions  [facility]  [address] [place], [place] [address]   Phone: [phone]  Fax: [phone]    [name] 1925-08-29 MRN: [id number]   X   ORAL DIABETIC MEDICATION INSTRUCTIONS  The day before your procedure:   Take your diabetic pill as you do normally  The day of your procedure:   Do not take your diabetic pill    We will check your blood sugar levels during the admission process and again in Recovery before discharging you home  ________________________________________________________________________  _  _   INSULIN (LONG ACTING) MEDICATION INSTRUCTIONS (Lantus, NPH, 70/30, Humulin, Novolin-N)   The day before your procedure:   Take  your regular evening dose    The day of your procedure:   Do not take your morning dose    _  _   INSULIN (SHORT ACTING) MEDICATION INSTRUCTIONS (Regular, Humulog, Novolog)   The day before your procedure:   Do not take your evening dose   The day of your procedure:   Do not take your morning dose   _  _   INSULIN PUMP MEDICATION INSTRUCTIONS  We will contact the physician managing your diabetic care for written dosage instructions for the day before your procedure and the day of your procedure.  Once we have received the instructions, we will contact you.

## 2010-12-11 NOTE — Assessment & Plan Note (Signed)
Summary: stomach problem/#/cd   Vital Signs:  Patient profile:   77 year old female Height:      62 inches (157.48 cm) Weight:      128.75 pounds (58.52 kg) O2 Sat:      99 % on Room air Temp:     96.5 degrees F (35.83 degrees C) oral Pulse rate:   68 / minute BP sitting:   128 / 60  (left arm) Cuff size:   regular  Vitals Entered By: Josph Macho CMA (September 12, 2009 11:17 AM)  O2 Flow:  Room air CC: Stomach pain, pain in right side, sharp every now and then X3 weeks/ CF Is Patient Diabetic? Yes   Primary Provider:  Minus Breeding MD  CC:  Stomach pain, pain in right side, and sharp every now and then X3 weeks/ CF.  History of Present Illness: pt states few weeks of moderate intermitent periumbilical pain rad to the rlq.  no assoc n/v.  on further questioning, she says it is epigastric, too. pt also has a few lbs of weight loss. she only takes meformin 2x500 mg once daily. she forgot vitamin b-12 injection last month. she reports cold intolerance. she does not take fe supplement  Current Medications (verified): 1)  Toprol Xl 50 Mg Tb24 (Metoprolol Succinate) .... Take 1 Tablet By Mouth Once A Day 2)  Verapamil Hcl Cr 240 Mg Tbcr (Verapamil Hcl) .... Take 1 By Mouth Qd 3)  Glucophage Xr 500 Mg  Tb24 (Metformin Hcl) .... 2-Bid 4)  Zestoretic 20-12.5 Mg  Tabs (Lisinopril-Hydrochlorothiazide) .... Take 2 By Mouth Qd 5)  Ultracet 37.5-325 Mg  Tabs (Tramadol-Acetaminophen) .... Q4h As Needed Pain 6)  Pravachol 40 Mg Tabs (Pravastatin Sodium) .... 2 Qhs 7)  Methocarbamol 500 Mg Tabs (Methocarbamol) .[name].. 1 Qhs 8)  B12 .[name].. 1000 Micrograms Q Month  Allergies (verified): 1)  ! Sulfa 2)  ! Pcn 3)  ! Feldene 4)  ! Erythromycin 5)  ! Celebrex 6)  ! Macrobid 7)  ! * Decongestants  Past History:  Past Medical History: Last updated: 08/21/2009 DYSLIPIDEMIA, rx limited by perceived intolerance to statins (ICD-272.4) ENCOUNTER FOR LONG-TERM USE OF OTHER MEDICATIONS  (ICD-V58.69) PERNICIOUS ANEMIA (ICD-281.0) UNSPECIFIED ANEMIA (ICD-285.9) PALPITATIONS (ICD-785.1) LEG PAIN, BILATERAL (ICD-729.5) COUGH (ICD-786.2) ACUTE CYSTITIS (ICD-595.0) LUMBOSACRAL RADICULOPATHY (ICD-724.4) KNEE PAIN, RIGHT (ICD-719.46) EDEMA (ICD-782.3) CLIMACTERIC STATE, FEMALE (ICD-627.2) BACK PAIN, LUMBAR (ICD-724.2) GALLBLADDER DISEASE (ICD-575.9) DISC DISEASE, LUMBAR (ICD-722.52) CONSTIPATION NOS (ICD-564.00) ABDOMINAL PAIN, CHRONIC (ICD-789.00) ARTHRITIS (ICD-716.90) COLONIC POLYPS (ICD-211.3) ADENOCARCINOMA, COLON, FAMILY HX (ICD-V16.0) HYPERTENSION (ICD-401.9) DIVERTICULITIS, HX OF (ICD-V12.79) DIABETES MELLITUS, TYPE II (ICD-250.00) ALLERGIC RHINITIS (ICD-477.9)  Past Surgical History: Hysterectomy and incidental appendectomy(1974) Cholecystectomy (2003) Back surgery ( 1989)  Review of Systems  The patient denies fever, hematochezia, and hematuria.         denies dysphagia  Physical Exam  General:  elderly, frail, no distress  Abdomen:  abdomen is soft, nontender.  no hepatosplenomegaly.   not distended.  no hernia    Impression & Recommendations:  Problem # 1:  ABDOMINAL PAIN, CHRONIC (ICD-789.00) ? due to constipation  Problem # 2:  DIABETES MELLITUS, TYPE II (ICD-250.00) uncertain control  Problem # 3:  PERNICIOUS ANEMIA (ICD-281.0)  Problem # 4:  weight loss mild, ? related to #1  Problem # 5:  cold intolerance uncertain etiology  Problem # 6:  UNSPECIFIED ANEMIA (ICD-285.9)  Medications Added to Medication List This Visit: 1)  Lactulose 10 Gm/66ml Soln (Lactulose) .... 3 tablespoons  bid  Other Orders: TLB-BMP (Basic Metabolic Panel-BMET) (80048-METABOL) TLB-CBC Platelet - w/Differential (85025-CBCD) TLB-Hepatic/Liver Function Pnl (80076-HEPATIC) TLB-Amylase (82150-AMYL) TLB-A1C / Hgb A1C (Glycohemoglobin) (83036-A1C) Radiology Referral (Radiology) Vit B12 1000 mcg (J3420) Admin of Therapeutic Inj  intramuscular or  subcutaneous (04540) Est. Patient Level IV (98119)  Patient Instructions: 1)  check ct 2)  take iron 2 pills per day. 3)  blood tests today. 4)  Please schedule a follow-up appointment in 3 months. 5)  trial of lactulose 3 tablespoons two times a day Prescriptions: LACTULOSE 10 GM/15ML SOLN (LACTULOSE) 3 tablespoons bid  #30 days x 11   Entered and Authorized by:   Minus Breeding MD   Signed by:   Minus Breeding MD on 09/12/2009   Method used:   [facility]ron[facility] to        [id number]       [address]       [place], [place]  [address]       Ph: [phone] or [phone]       Fax: [phone]   RxID:   [id number]        Medication Administration  Injection # 1:    Medication: Vit B12 1000 mcg    Diagnosis: PERNICIOUS ANEMIA (ICD-281.0)    Route: IM    Site: R deltoid    Exp Date: 8/12    Lot #: [id number]    Mfr: American Regent    Patient tolerated injection without complications    Given by: Josph Macho CMA (September 12, 2009 11:35 AM)  Orders Added: 1)  TLB-BMP (Basic Metabolic Panel-BMET) [80048-METABOL] 2)  TLB-CBC Platelet - w/Differential [85025-CBCD] 3)  TLB-Hepatic/Liver Function Pnl [80076-HEPATIC] 4)  TLB-Amylase [82150-AMYL] 5)  TLB-A1C / Hgb A1C (Glycohemoglobin) [83036-A1C] 6)  Radiology Referral [Radiology] 7)  Vit B12 1000 mcg [J3420] 8)  Admin of Therapeutic Inj  intramuscular or subcutaneous [96372] 9)  Est. Patient Level IV [43329]

## 2010-12-11 NOTE — Letter (Signed)
Summary: [facility] Instructions  [facility]  [address] [place], [place] [address]   Phone: [phone]  Fax: [phone]       [name]    03-Feb-1997    MRN: [id number]       Procedure Day /Date: 01-10-10     Arrival Time: 8:30 Am      Procedure Time:9:30 AM     Location of Procedure:                    X     [facility] (4th Floor)  PREPARATION FOR COLONOSCOPY WITH MIRALAX  Starting 5 days prior to your procedure 01-05-10 do not eat nuts, seeds, popcorn, corn, beans, peas,  salads, or any raw vegetables.  Do not take any fiber supplements (e.g. Metamucil, Citrucel, and Benefiber). ____________________________________________________________________________________________________    Day before procedure 01-09-10 Tuesday  1   Drink clear liquids the entire day-NO SOLID FOOD  2   Do not drink anything colored red or purple.  Avoid juices with pulp.  No orange juice.  3   Drink at least 64 oz. (8 glasses) of fluid/clear liquids during the day to prevent dehydration and help the prep work efficiently.  CLEAR LIQUIDS INCLUDE: Water Jello Ice Popsicles Tea (sugar ok, no milk/cream) Powdered fruit flavored drinks Coffee (sugar ok, no milk/cream) Gatorade Juice: apple, white grape, white cranberry  Lemonade Clear bullion, consomm, broth Carbonated beverages (any kind) Strained chicken noodle soup Hard Candy  4   Mix the entire bottle of Miralax with 64 oz. of Gatorade/Powerade in the morning and put in the refrigerator to chill.  5   At 3:00 pm take 2 Dulcolax/Bisacodyl tablets.  6   At 4:30 pm take one Reglan/Metoclopramide tablet.  7  Starting at 5:00 pm drink one 8 oz glass of the Miralax mixture every 15-20 minutes until you have finished drinking the entire 64 oz.  You should finish drinking prep around 7:30 or 8:00 pm.  8   If you are nauseated, you may take the 2nd Reglan/Metoclopramide tablet at 6:30 pm.        9    At 8:00 pm  take 2 more DULCOLAX/Bisacodyl tablets.     THE DAY OF YOUR PROCEDURE      DATE:  01-10-10 DAY: Wednesday  You may drink clear liquids until 7:30 AM  (2 HOURS BEFORE PROCEDURE).   MEDICATION INSTRUCTIONS  Unless otherwise instructed, you should take regular prescription medications with a small sip of water as early as possible the morning of your procedure.  Diabetic patients - see separate instructions.       OTHER INSTRUCTIONS  You will need a responsible adult at least 75 years of age to accompany you and drive you home.   This person must remain in the waiting room during your procedure.  Wear loose fitting clothing that is easily removed.  Leave jewelry and other valuables at home.  However, you may wish to bring a book to read or an iPod/MP3 player to listen to music as you wait for your procedure to start.  Remove all body piercing jewelry and leave at home.  Total time from sign-in until discharge is approximately 2-3 hours.  You should go home directly after your procedure and rest.  You can resume normal activities the day after your procedure.  The day of your procedure you should not:   Drive   Make legal decisions   Operate machinery  Drink alcohol   Return to work  You will receive specific instructions about eating, activities and medications before you leave.   The above instructions have been reviewed and explained to me by   _______________________    I fully understand and can verbalize these instructions _____________________________ Date _______

## 2010-12-11 NOTE — Assessment & Plan Note (Signed)
Summary: POST HOSP/ PER TRIAGE/NWS  #   Vital Signs:  Patient profile:   77 year old female Height:      62 inches (157.48 cm) Weight:      130.38 pounds (59.26 kg) O2 Sat:      97 % on Room air Temp:     97.5 degrees F (36.39 degrees C) oral Pulse rate:   63 / minute BP sitting:   118 / 72  (left arm) Cuff size:   regular  Vitals Entered By: Josph Macho RMA (Mar 29, 2010 11:25 AM)  O2 Flow:  Room air CC: Post hospital/ CF Is Patient Diabetic? Yes   Referring Provider:  Romero Belling, MD Primary Provider:  Minus Breeding MD  CC:  Post hospital/ CF.  History of Present Illness: pt says she has had no further episodes of syncope since hospitalization.   anemia:  she takes fe tabs (nu-iron) 1 once daily. pt says her ears are slightly "stopped up" x a few days.  no asssoc pain. no cbg record, but states cbg's are well-controlled.  Current Medications (verified): 1)  Toprol Xl 50 Mg Tb24 (Metoprolol Succinate) .... Take 1 Tablet By Mouth Once A Day 2)  Verapamil Hcl Cr 240 Mg Tbcr (Verapamil Hcl) .... Take 1 By Mouth Qd 3)  Glucophage Xr 500 Mg  Tb24 (Metformin Hcl) .... 2 Tabs Two Times A Day 4)  Pravachol 40 Mg Tabs (Pravastatin Sodium) .... 2 Qhs 5)  B12 .[name].. 1000 Micrograms Q Month 6)  Januvia 100 Mg Tabs (Sitagliptin Phosphate) .[name].. 1 Qam 7)  Aspirin Ec 325 Mg Tbec (Aspirin) .... Take One Tablet By Mouth Daily 8)  Plavix 75 Mg Tabs (Clopidogrel Bisulfate) .[name].. 1 Tab Once Daily 9)  Nitrostat 0.4 Mg Subl (Nitroglycerin) .[name].. 1 Tablet Under Tongue At Onset of Chest Pain; You May Repeat Every 5 Minutes For Up To 3 Doses. 10)  Lisinopril-Hydrochlorothiazide 20-12.5 Mg Tabs (Lisinopril-Hydrochlorothiazide) .... 2 Tab Once Daily 11)  Iron 12)  Pepcid .... Once Daily  Allergies (verified): 1)  ! Sulfa 2)  ! Pcn 3)  ! Feldene 4)  ! Erythromycin 5)  ! Celebrex 6)  ! Macrobid 7)  ! * Decongestants 8)  ! Betadine  Past History:  Past Medical History: Last updated:  02/14/2010 PAROXYSMAL ATRIAL FIBRILLATION (ICD-427.31) CAD, NATIVE VESSEL (ICD-414.01) HYPERTENSION (ICD-401.9) DYSLIPIDEMIA (ICD-272.4) EDEMA (ICD-782.3) LEG PAIN, BILATERAL (ICD-729.5) PALPITATIONS (ICD-785.1) GERD (ICD-530.81) ANEMIA-B12 DEFICIENCY (ICD-281.1) OSTEOARTHRITIS (ICD-715.9) PERSONAL HX COLONIC POLYPS (ICD-V12.72) DIZZINESS AND GIDDINESS (ICD-780.4) ANEMIA, IRON DEFICIENCY (ICD-280.9) ABDOMINAL PAIN (ICD-789.00) ENCOUNTER FOR LONG-TERM USE OF OTHER MEDICATIONS (ICD-V58.69) PERNICIOUS ANEMIA (ICD-281.0) COUGH (ICD-786.2) ACUTE CYSTITIS (ICD-595.0) LUMBOSACRAL RADICULOPATHY (ICD-724.4) KNEE PAIN, RIGHT (ICD-719.46) CLIMACTERIC STATE, FEMALE (ICD-627.2) BACK PAIN, LUMBAR (ICD-724.2) GALLBLADDER DISEASE (ICD-575.9) DISC DISEASE, LUMBAR (ICD-722.52) CONSTIPATION NOS (ICD-564.00) ARTHRITIS (ICD-716.90) COLONIC POLYPS (ICD-211.3) ADENOCARCINOMA, COLON, FAMILY HX (ICD-V16.0) DIVERTICULITIS, HX OF (ICD-V12.79) DIABETES MELLITUS, TYPE II (ICD-250.00) ALLERGIC RHINITIS (ICD-477.9)  Review of Systems       she has a little nasal congestion.  no hypoglycemia.  Physical Exam  General:  elderly, frail, no distress  Msk:  gait is normal and steady Additional Exam:  Hemoglobin           [L]  10.1 g/dL                   32.4-40.1   Hematocrit           [L]  29.8 %    Iron Saturation      [  L]  7.9 % Hemoglobin A1C            6.5 %     Impression & Recommendations:  Problem # 1:  SYNCOPE (ICD-780.2) Assessment Improved uncertain etiology  Problem # 2:  ANEMIA, IRON DEFICIENCY (ICD-280.9) needs increased rx  Problem # 3:  ALLERGIC RHINITIS (ICD-477.9) needs increased rx  Problem # 4:  DIABETES MELLITUS, TYPE II (ICD-250.00) well-controlled  Medications Added to Medication List This Visit: 1)  Iron  2)  Pepcid  .... Once daily 3)  Loratadine-pseudoephedrine 10-240 Mg Xr24h-tab (Loratadine-pseudoephedrine) .[name].. 1 once daily as needed for congestion 4)  Iron  325 (65 Fe) Mg Tabs (Ferrous sulfate) .[name].. 1 tab two times a day  Other Orders: Vit B12 1000 mcg (J3420) Admin of Therapeutic Inj  intramuscular or subcutaneous (95621) TLB-CBC Platelet - w/Differential (85025-CBCD) TLB-IBC Pnl (Iron/FE;Transferrin) (83550-IBC) TLB-A1C / Hgb A1C (Glycohemoglobin) (83036-A1C) Est. Patient Level IV (30865)  Patient Instructions: 1)  blood tests are being ordered for you today.   please call [phone] to hear your test results. 2)  try loratadine-d-24, 1 once daily as needed for congestion. 3)  Please schedule a "medicare wellness" appointment in 3 months. 4)  (update: i left message on phone-tree:  increase fe to 1 bid) Prescriptions: LORATADINE-PSEUDOEPHEDRINE 10-240 MG XR24H-TAB (LORATADINE-PSEUDOEPHEDRINE) 1 once daily as needed for congestion  #30 x 0   Entered and Authorized by:   Minus Breeding MD   Signed by:   Minus Breeding MD on 03/29/2010   Method used:   [facility]  [id number]       [address]       [place], [place]  [address]       Ph: [phone] or [phone]       Fax: [phone]   RxID:   [id number]    Medication Administration  Injection # 1:    Medication: Vit B12 1000 mcg    Diagnosis: ANEMIA-B12 DEFICIENCY (ICD-281.1)    Route: IM    Site: R deltoid    Exp Date: 1/13    Lot #: [id number]    Mfr: American Regent    Patient tolerated injection without complications    Given by: Josph Macho RMA (Mar 29, 2010 1:21 PM)  Orders Added: 1)  Vit B12 1000 mcg [J3420] 2)  Admin of Therapeutic Inj  intramuscular or subcutaneous [96372] 3)  TLB-CBC Platelet - w/Differential [85025-CBCD] 4)  TLB-IBC Pnl (Iron/FE;Transferrin) [83550-IBC] 5)  TLB-A1C / Hgb A1C (Glycohemoglobin) [83036-A1C] 6)  Est. Patient Level IV [60109]

## 2010-12-12 ENCOUNTER — Telehealth: Payer: Self-pay | Admitting: Endocrinology

## 2010-12-19 NOTE — Cardiovascular Report (Signed)
Summary: [facility]  [facility]   Imported By: Lester [place] 12/12/2010 09:06:44  _____________________________________________________________________  External Attachment:    Type:   Image     Comment:   External Document

## 2010-12-19 NOTE — Progress Notes (Signed)
Summary: Rx req  Phone Note Call from Patient   Summary of Call: Pt's daughter called requesting MD refill pt's iron, Nu Iron 150mg  caps. Pt says that she felt better with this medication. Iron was removed from medlist, okay to add and [facility]ill [facility] [address]? Initial call taken by: Margaret Pyle, CMA,  December 12, 2010 12:56 PM  Follow-up for Phone Call        rx sent Follow-up by: Minus Breeding MD,  December 13, 2010 8:04 AM    New/Updated Medications: NU-IRON 150 MG CAPS (POLYSACCHARIDE IRON COMPLEX) 1 tab once daily Prescriptions: NU-IRON 150 MG CAPS (POLYSACCHARIDE IRON COMPLEX) 1 tab once daily  #30 x 11   Entered and Authorized by:   Minus Breeding MD   Signed by:   Minus Breeding MD on 12/13/2010   Method used:   [facility]  [id number]       [address]       [place], [place]  [address]       Ph: [phone] or [phone]       Fax: [phone]   RxID:   [id number]

## 2010-12-19 NOTE — Assessment & Plan Note (Signed)
Summary: f/u appt,#,cd   Vital Signs:  Patient profile:   77 year old female Menstrual status:  postmenopausal Height:      62 inches (157.48 cm) Weight:      123.50 pounds (56.14 kg) BMI:     22.67 O2 Sat:      99 % on Room air Temp:     98.9 degrees F (37.17 degrees C) oral Pulse rate:   72 / minute Pulse rhythm:   regular BP sitting:   142 / 84  (left arm) Cuff size:   regular  Vitals Entered By: Brenton Grills CMA Duncan Dull) (December 06, 2010 1:35 PM)  O2 Flow:  Room air CC: Follow-up visit/aj Is Patient Diabetic? Yes     Menstrual Status postmenopausal Last PAP Result normal   Referring Provider:  Romero Belling, MD Primary Provider:  Minus Breeding MD  CC:  Follow-up visit/aj.  History of Present Illness: the status of at least 3 ongoing medical problems is addressed today: hypertension:she denies dizziness hyponatremia: no headache anemia: she does not take any fe tabs  Current Medications (verified): 1)  Toprol Xl 50 Mg Tb24 (Metoprolol Succinate) .... Take 1 Tablet By Mouth Once A Day 2)  Glucophage Xr 500 Mg  Tb24 (Metformin Hcl) .[name].. 1 Tabs Two Times A Day 3)  Pravachol 40 Mg Tabs (Pravastatin Sodium) .... 2 Qhs 4)  B12 .[name].. 1000 Micrograms Q Month 5)  Januvia 100 Mg Tabs (Sitagliptin Phosphate) .[name].. 1 Qam 6)  Aspirin 81 Mg Tbec (Aspirin) .... Take One Tablet By Mouth Daily 7)  Nitrostat 0.4 Mg Subl (Nitroglycerin) .[name].. 1 Tablet Under Tongue At Onset of Chest Pain; You May Repeat Every 5 Minutes For Up To 3 Doses. 8)  Lisinopril-Hydrochlorothiazide 20-12.5 Mg Tabs (Lisinopril-Hydrochlorothiazide) .[name].. 1 Tab Once Daily 9)  Pepcid Ac Maximum Strength 20 Mg Tabs (Famotidine) .[name].. 1 Tab Once Daily 10)  Loratadine-Pseudoephedrine 10-240 Mg Xr24h-Tab (Loratadine-Pseudoephedrine) .[name].. 1 Once Daily As Needed For Congestion 11)  Nu-Iron 150 Mg Caps (Polysaccharide Iron Complex) .[name].. 1 Cap Once Daily  Allergies (verified): 1)  ! Sulfa 2)  ! Pcn 3)  ! Feldene 4)  !  Erythromycin 5)  ! Celebrex 6)  ! Macrobid 7)  ! * Decongestants 8)  ! Betadine  Past History:  Past Medical History: Last updated: 02/14/2010 PAROXYSMAL ATRIAL FIBRILLATION (ICD-427.31) CAD, NATIVE VESSEL (ICD-414.01) HYPERTENSION (ICD-401.9) DYSLIPIDEMIA (ICD-272.4) EDEMA (ICD-782.3) LEG PAIN, BILATERAL (ICD-729.5) PALPITATIONS (ICD-785.1) GERD (ICD-530.81) ANEMIA-B12 DEFICIENCY (ICD-281.1) OSTEOARTHRITIS (ICD-715.9) PERSONAL HX COLONIC POLYPS (ICD-V12.72) DIZZINESS AND GIDDINESS (ICD-780.4) ANEMIA, IRON DEFICIENCY (ICD-280.9) ABDOMINAL PAIN (ICD-789.00) ENCOUNTER FOR LONG-TERM USE OF OTHER MEDICATIONS (ICD-V58.69) PERNICIOUS ANEMIA (ICD-281.0) COUGH (ICD-786.2) ACUTE CYSTITIS (ICD-595.0) LUMBOSACRAL RADICULOPATHY (ICD-724.4) KNEE PAIN, RIGHT (ICD-719.46) CLIMACTERIC STATE, FEMALE (ICD-627.2) BACK PAIN, LUMBAR (ICD-724.2) GALLBLADDER DISEASE (ICD-575.9) DISC DISEASE, LUMBAR (ICD-722.52) CONSTIPATION NOS (ICD-564.00) ARTHRITIS (ICD-716.90) COLONIC POLYPS (ICD-211.3) ADENOCARCINOMA, COLON, FAMILY HX (ICD-V16.0) DIVERTICULITIS, HX OF (ICD-V12.79) DIABETES MELLITUS, TYPE II (ICD-250.00) ALLERGIC RHINITIS (ICD-477.9)  Review of Systems  The patient denies weight loss, weight gain, and syncope.    Physical Exam  General:  normal appearance.   Extremities:  no edema Additional Exam:  Iron Saturation      [L]  15.4 %                      20.0-50.0   Hemoglobin           [L]  11.4 g/dL  12.0-15.0   Hematocrit           [L]  33.5 %       Impression & Recommendations:  Problem # 1:  HYPONATREMIA (ICD-276.1) Assessment Improved  Problem # 2:  HYPERTENSION (ICD-401.9) needs increased rx  Problem # 3:  ANEMIA-B12 DEFICIENCY (ICD-281.1) needs increased rx  Other Orders: TLB-IBC Pnl (Iron/FE;Transferrin) (83550-IBC) TLB-CBC Platelet - w/Differential (85025-CBCD) TLB-BMP (Basic Metabolic Panel-BMET) (80048-METABOL) Est. Patient Level IV  (37169)  Patient Instructions: 1)  blood tests are being ordered for you today.  please call [phone] to hear your test results. 2)  pending the test results, please continue the same medications for now 3)  please schedule a "medicare wellness" appointment. 4)  (update: i left message on phone-tree:  same zestoretic.  take fe 1/day).   Orders Added: 1)  TLB-IBC Pnl (Iron/FE;Transferrin) [83550-IBC] 2)  TLB-CBC Platelet - w/Differential [85025-CBCD] 3)  TLB-BMP (Basic Metabolic Panel-BMET) [80048-METABOL] 4)  Est. Patient Level IV [01751]        Preventive Care Screening  Mammogram:    Date:  06/11/2010    Results:  normal   Pap Smear:    Date:  05/04/2010    Results:  normal

## 2010-12-25 ENCOUNTER — Other Ambulatory Visit: Payer: Self-pay

## 2010-12-25 LAB — CONVERTED CEMR LAB
Basophils Absolute: 0 10*3/uL (ref 0.0–0.1)
Basophils Relative: 0.7 % (ref 0.0–3.0)
Eosinophils Absolute: 0.3 10*3/uL (ref 0.0–0.7)
Eosinophils Relative: 7.4 % — ABNORMAL HIGH (ref 0.0–5.0)
Ferritin: 309.9 ng/mL — ABNORMAL HIGH (ref 10.0–291.0)
HCT: 34.1 % — ABNORMAL LOW (ref 36.0–46.0)
Hemoglobin: 11.3 g/dL — ABNORMAL LOW (ref 12.0–15.0)
Iron: 75 ug/dL (ref 42–145)
Lymphocytes Relative: 25.3 % (ref 12.0–46.0)
Lymphs Abs: 1.1 10*3/uL (ref 0.7–4.0)
MCHC: 33.1 g/dL (ref 30.0–36.0)
MCV: 95.4 fL (ref 78.0–100.0)
Monocytes Absolute: 0.6 10*3/uL (ref 0.1–1.0)
Monocytes Relative: 14.7 % — ABNORMAL HIGH (ref 3.0–12.0)
Neutro Abs: 2.3 10*3/uL (ref 1.4–7.7)
Neutrophils Relative %: 51.9 % (ref 43.0–77.0)
Platelets: 228 10*3/uL (ref 150.0–400.0)
RBC: 3.58 M/uL — ABNORMAL LOW (ref 3.87–5.11)
RDW: 13.4 % (ref 11.5–14.6)
Saturation Ratios: 24.1 % (ref 20.0–50.0)
Transferrin: 222.7 mg/dL (ref 212.0–360.0)
WBC: 4.4 10*3/uL — ABNORMAL LOW (ref 4.5–10.5)

## 2011-01-02 ENCOUNTER — Other Ambulatory Visit: Payer: Medicare Other

## 2011-01-02 ENCOUNTER — Encounter (INDEPENDENT_AMBULATORY_CARE_PROVIDER_SITE_OTHER): Payer: Self-pay | Admitting: *Deleted

## 2011-01-02 ENCOUNTER — Encounter (INDEPENDENT_AMBULATORY_CARE_PROVIDER_SITE_OTHER): Payer: Medicare Other | Admitting: Endocrinology

## 2011-01-02 ENCOUNTER — Encounter: Payer: Self-pay | Admitting: Endocrinology

## 2011-01-02 ENCOUNTER — Other Ambulatory Visit: Payer: Self-pay | Admitting: Endocrinology

## 2011-01-02 DIAGNOSIS — E119 Type 2 diabetes mellitus without complications: Secondary | ICD-10-CM

## 2011-01-02 DIAGNOSIS — E871 Hypo-osmolality and hyponatremia: Secondary | ICD-10-CM

## 2011-01-02 DIAGNOSIS — Z0000 Encounter for general adult medical examination without abnormal findings: Secondary | ICD-10-CM

## 2011-01-02 DIAGNOSIS — IMO0002 Reserved for concepts with insufficient information to code with codable children: Secondary | ICD-10-CM

## 2011-01-02 DIAGNOSIS — Z79899 Other long term (current) drug therapy: Secondary | ICD-10-CM

## 2011-01-02 LAB — BASIC METABOLIC PANEL
BUN: 17 mg/dL (ref 6–23)
CO2: 27 mEq/L (ref 19–32)
Calcium: 9.8 mg/dL (ref 8.4–10.5)
Chloride: 103 mEq/L (ref 96–112)
Creatinine, Ser: 0.8 mg/dL (ref 0.4–1.2)
GFR: 90.47 mL/min (ref >60.00)
Glucose, Bld: 112 mg/dL — ABNORMAL HIGH (ref 70–99)
Potassium: 4.7 mEq/L (ref 3.5–5.1)
Sodium: 136 mEq/L (ref 135–145)

## 2011-01-02 LAB — HEMOGLOBIN A1C: Hgb A1c MFr Bld: 7 % — ABNORMAL HIGH (ref 4.6–6.5)

## 2011-01-08 NOTE — Assessment & Plan Note (Signed)
Summary: per pt 3 wk yearly---stc   Vital Signs:  Patient profile:   77 year old female Menstrual status:  postmenopausal Height:      62 inches (157.48 cm) Weight:      127.50 pounds (57.95 kg) BMI:     23.40 O2 Sat:      97 % on Room air Temp:     97.9 degrees F (36.61 degrees C) oral Pulse rate:   65 / minute Pulse rhythm:   regular BP sitting:   140 / 70  (left arm) Cuff size:   regular  Vitals Entered By: Brenton Grills CMA (AAMA) (January 02, 2011 10:02 AM)  O2 Flow:  Room air CC: Yearly Exam/aj Is Patient Diabetic? Yes   Referring Provider:  Romero Belling, MD Primary Provider:  Minus Breeding MD  CC:  Yearly Exam/aj.  History of Present Illness: pt is here for medicare welllness visit.  she denies memory loss and depression.  she says she is able to perform activities of daily living without assistance.  she has no limitations to physical activity.   Current Medications (verified): 1)  Toprol Xl 50 Mg Tb24 (Metoprolol Succinate) .... Take 1 Tablet By Mouth Once A Day 2)  Glucophage Xr 500 Mg  Tb24 (Metformin Hcl) .[name].. 1 Tabs Two Times A Day 3)  Pravachol 40 Mg Tabs (Pravastatin Sodium) .... 2 Qhs 4)  B12 .[name].. 1000 Micrograms Q Month 5)  Januvia 100 Mg Tabs (Sitagliptin Phosphate) .[name].. 1 Qam 6)  Aspirin 81 Mg Tbec (Aspirin) .... Take One Tablet By Mouth Daily 7)  Nitrostat 0.4 Mg Subl (Nitroglycerin) .[name].. 1 Tablet Under Tongue At Onset of Chest Pain; You May Repeat Every 5 Minutes For Up To 3 Doses. 8)  Lisinopril-Hydrochlorothiazide 20-12.5 Mg Tabs (Lisinopril-Hydrochlorothiazide) .[name].. 1 Tab Once Daily 9)  Pepcid Ac Maximum Strength 20 Mg Tabs (Famotidine) .[name].. 1 Tab Once Daily 10)  Loratadine-Pseudoephedrine 10-240 Mg Xr24h-Tab (Loratadine-Pseudoephedrine) .[name].. 1 Once Daily As Needed For Congestion 11)  Nu-Iron 150 Mg Caps (Polysaccharide Iron Complex) .[name].. 1 Tab Once Daily  Allergies (verified): 1)  ! Sulfa 2)  ! Pcn 3)  ! Feldene 4)  ! Erythromycin 5)  !  Celebrex 6)  ! Macrobid 7)  ! * Decongestants 8)  ! Betadine  Past History:  Past Medical History: PAROXYSMAL ATRIAL FIBRILLATION (ICD-427.31) CAD, NATIVE VESSEL (ICD-414.01) HYPERTENSION (ICD-401.9) DYSLIPIDEMIA (ICD-272.4) EDEMA (ICD-782.3) LEG PAIN, BILATERAL (ICD-729.5) PALPITATIONS (ICD-785.1) GERD (ICD-530.81) ANEMIA-B12 DEFICIENCY (ICD-281.1) OSTEOARTHRITIS (ICD-715.9) PERSONAL HX COLONIC POLYPS (ICD-V12.72) DIZZINESS AND GIDDINESS (ICD-780.4) ANEMIA, IRON DEFICIENCY (ICD-280.9) ABDOMINAL PAIN (ICD-789.00) ENCOUNTER FOR LONG-TERM USE OF OTHER MEDICATIONS (ICD-V58.69) PERNICIOUS ANEMIA (ICD-281.0) COUGH (ICD-786.2) ACUTE CYSTITIS (ICD-595.0) LUMBOSACRAL RADICULOPATHY (ICD-724.4) KNEE PAIN, RIGHT (ICD-719.46) CLIMACTERIC STATE, FEMALE (ICD-627.2) BACK PAIN, LUMBAR (ICD-724.2) GALLBLADDER DISEASE (ICD-575.9) DISC DISEASE, LUMBAR (ICD-722.52) CONSTIPATION NOS (ICD-564.00) ARTHRITIS (ICD-716.90) COLONIC POLYPS (ICD-211.3) ADENOCARCINOMA, COLON, FAMILY HX (ICD-V16.0) DIVERTICULITIS, HX OF (ICD-V12.79) DIABETES MELLITUS, TYPE II (ICD-250.00) ALLERGIC RHINITIS (ICD-477.9)  opthal: hecker gyn: lomax  Family History: Reviewed history from 08/18/2008 and no changes required. sister had breast and colon cancer daughter has breast cancer  Social History: Reviewed history from 01/01/2010 and no changes required. retired widowed 2006 Patient has never smoked.  Alcohol Use - no Daily Caffeine Use Illicit Drug Use - no physical sctivity is good, per pt.    Review of Systems  The patient denies vision loss and decreased hearing.    Physical Exam  General:  normal appearance.   Eyes:  (sees opthal) Ears:  grossly normal hearing.   Msk:  pt easily and quickly performs "get-up-and-go" from a sitting position  Psych:  remembers 1/3 at 5 minutes, but says "i wasn't paying attention"   excellent recall.  can easily read and write a sentence.  alert and oriented x  3. Additional Exam:  SEPARATE EVALUATION FOLLOWS--EACH PROBLEM HERE IS NEW, NOT RESPONDING TO TREATMENT, OR POSES SIGNIFICANT RISK TO THE PATIENT'S HEALTH: HISTORY OF THE PRESENT ILLNESS: pt states 1 month of intermittently severe pain at the left hip, rad down the lateral aspect of the left thigh.  no assoc numbness.   she also has 2 weeks of right ear pain.   PAST MEDICAL HISTORY reviewed and up to date today REVIEW OF SYSTEMS: denies fever and nasal congestion. PHYSICAL EXAMINATION: tm's and eac's: are normal back: is nontender gait: is normal and steady LABS:  Hemoglobin A1C       [H]  7.0 %  IMPRESSION: dm.  needs increased rx, if it can be done without hypoglycemia. ear pain, uncertain etiology apparent left L-5 radiculopathy PLAN: see instruction sheet   Impression & Recommendations:  Problem # 1:  ROUTINE GENERAL MEDICAL EXAM@HEALTH  CARE FACL (ICD-V70.0)  Medications Added to Medication List This Visit: 1)  Hydrocodone-acetaminophen 5-325 Mg Tabs (Hydrocodone-acetaminophen) .... 1/2 pill every 4 hrs as needed for pain 2)  Bromocriptine Mesylate 2.5 Mg Tabs (Bromocriptine mesylate) .... 1/2 tab at bedtime  Other Orders: TLB-A1C / Hgb A1C (Glycohemoglobin) (83036-A1C) TLB-BMP (Basic Metabolic Panel-BMET) (80048-METABOL) Est. Patient Level IV (41324) Medicare -1st Annual Wellness Visit [phone])  Patient Instructions: 1)  please consider these measures for your health:  minimize alcohol.  do not use tobacco products.  have a colonoscopy at least every 10 years from age 66.  keep firearms safely stored.  always use seat belts.  have working smoke alarms in your home.  see an eye doctor and dentist regularly.  never drive under the influence of alcohol or drugs (including prescription drugs).   2)  please let me know what your wishes would be, if artificial life support measures should become necessary.  it is critically important to prevent falling down (keep floor areas  well-lit, dry, and free of loose objects). 3)  here is a list of medicare-covered preventive services. 4)  take hydrocodone-acetaminophen pills as needed for the pain 5)  call next week if the pain is not better. 6)  blood tests are being ordered for you today.  please call [phone] to hear your test results. 7)  call next week if your ear pain persists, and i would be happy to refer you to a specialist 8)  (update: i left message on phone-tree:  add parlodel 1/2 of 2.5 mg at bedtime.  ret 3 mos) Prescriptions: BROMOCRIPTINE MESYLATE 2.5 MG TABS (BROMOCRIPTINE MESYLATE) 1/2 tab at bedtime  #15 x 5   Entered and Authorized by:   Minus Breeding MD   Signed by:   Minus Breeding MD on 01/02/2011   Method used:   Electronically to        [facility]  [address] [id number] (retail)       [address]       [place], [place]  [address]       Ph: [phone] or [phone]       Fax: [phone]   RxID:   [id number] HYDROCODONE-ACETAMINOPHEN 5-325 MG TABS (HYDROCODONE-ACETAMINOPHEN) 1/2 pill every 4 hrs as needed for pain  #  50 x 0   Entered and Authorized by:   Minus Breeding MD   Signed by:   Minus Breeding MD on 01/02/2011   Method used:   Print then Give to Patient   RxID:   [id number]    Orders Added: 1)  TLB-A1C / Hgb A1C (Glycohemoglobin) [83036-A1C] 2)  TLB-BMP (Basic Metabolic Panel-BMET) [80048-METABOL] 3)  Est. Patient Level IV [08657] 4)  Medicare -1st Annual Wellness Visit [G0438]

## 2011-01-10 ENCOUNTER — Encounter: Payer: Self-pay | Admitting: Endocrinology

## 2011-01-15 ENCOUNTER — Telehealth: Payer: Self-pay | Admitting: Endocrinology

## 2011-01-22 NOTE — Progress Notes (Signed)
Summary: Rx, SE?  Phone Note Call from Patient Call back at Home Phone [phone]   Caller: Daughter Summary of Call: Pt's daughter called stating pt has been experiencing and increase in GERD sxs, increased belching and indigestion. Daughter is requesting MD advise if this could be caused by medication or if additional medication can be added to Pepsid, please advise. Initial call taken by: Margaret Pyle, CMA,  January 15, 2011 3:56 PM  Follow-up for Phone Call        try prilosec 20 mg once daily  Follow-up by: Minus Breeding MD,  January 15, 2011 4:01 PM  Additional Follow-up for Phone Call Additional follow up Details #1::        Pt advised of above Additional Follow-up by: Margaret Pyle, CMA,  January 15, 2011 4:35 PM

## 2011-01-22 NOTE — Letter (Signed)
Summary: [facility]  [facility]   Imported By: Sherian Rein 01/15/2011 12:51:10  _____________________________________________________________________  External Attachment:    Type:   Image     Comment:   External Document

## 2011-01-28 LAB — BASIC METABOLIC PANEL
BUN: 16 mg/dL (ref 6–23)
CO2: 25 mEq/L (ref 19–32)
Calcium: 9.1 mg/dL (ref 8.4–10.5)
Chloride: 107 mEq/L (ref 96–112)
Creatinine, Ser: 0.88 mg/dL (ref 0.4–1.2)
GFR calc Af Amer: >60 mL/min (ref >60)
GFR calc non Af Amer: >60 mL/min (ref >60)
Glucose, Bld: 110 mg/dL — ABNORMAL HIGH (ref 70–99)
Potassium: 4 mEq/L (ref 3.5–5.1)
Sodium: 137 mEq/L (ref 135–145)

## 2011-01-28 LAB — GLUCOSE, CAPILLARY
Glucose-Capillary: 109 mg/dL — ABNORMAL HIGH (ref 70–99)
Glucose-Capillary: 87 mg/dL (ref 70–99)

## 2011-01-28 LAB — CBC
HCT: 27.1 % — ABNORMAL LOW (ref 36.0–46.0)
Hemoglobin: 9.1 g/dL — ABNORMAL LOW (ref 12.0–15.0)
MCHC: 33.7 g/dL (ref 30.0–36.0)
MCV: 90.3 fL (ref 78.0–100.0)
Platelets: 177 10*3/uL (ref 150–400)
RBC: 3 MIL/uL — ABNORMAL LOW (ref 3.87–5.11)
RDW: 14.6 % (ref 11.5–15.5)
WBC: 3.6 10*3/uL — ABNORMAL LOW (ref 4.0–10.5)

## 2011-01-29 LAB — BASIC METABOLIC PANEL
BUN: 17 mg/dL (ref 6–23)
BUN: 20 mg/dL (ref 6–23)
CO2: 22 mEq/L (ref 19–32)
CO2: 25 mEq/L (ref 19–32)
Calcium: 8.7 mg/dL (ref 8.4–10.5)
Calcium: 9.2 mg/dL (ref 8.4–10.5)
Chloride: 101 mEq/L (ref 96–112)
Chloride: 102 mEq/L (ref 96–112)
Creatinine, Ser: 0.91 mg/dL (ref 0.4–1.2)
Creatinine, Ser: 0.93 mg/dL (ref 0.4–1.2)
GFR calc Af Amer: >60 mL/min (ref >60)
GFR calc Af Amer: >60 mL/min (ref >60)
GFR calc non Af Amer: 58 mL/min — ABNORMAL LOW (ref >60)
GFR calc non Af Amer: 59 mL/min — ABNORMAL LOW (ref >60)
Glucose, Bld: 129 mg/dL — ABNORMAL HIGH (ref 70–99)
Glucose, Bld: 94 mg/dL (ref 70–99)
Potassium: 3.7 mEq/L (ref 3.5–5.1)
Potassium: 4 mEq/L (ref 3.5–5.1)
Sodium: 133 mEq/L — ABNORMAL LOW (ref 135–145)
Sodium: 133 mEq/L — ABNORMAL LOW (ref 135–145)

## 2011-01-29 LAB — CBC
HCT: 26.8 % — ABNORMAL LOW (ref 36.0–46.0)
HCT: 28.8 % — ABNORMAL LOW (ref 36.0–46.0)
Hemoglobin: 9 g/dL — ABNORMAL LOW (ref 12.0–15.0)
Hemoglobin: 9.6 g/dL — ABNORMAL LOW (ref 12.0–15.0)
MCHC: 33.5 g/dL (ref 30.0–36.0)
MCHC: 33.6 g/dL (ref 30.0–36.0)
MCV: 90.7 fL (ref 78.0–100.0)
MCV: 91.3 fL (ref 78.0–100.0)
Platelets: 186 10*3/uL (ref 150–400)
Platelets: 194 10*3/uL (ref 150–400)
RBC: 2.95 MIL/uL — ABNORMAL LOW (ref 3.87–5.11)
RBC: 3.16 MIL/uL — ABNORMAL LOW (ref 3.87–5.11)
RDW: 14.5 % (ref 11.5–15.5)
RDW: 14.5 % (ref 11.5–15.5)
WBC: 4.8 10*3/uL (ref 4.0–10.5)
WBC: 5 10*3/uL (ref 4.0–10.5)

## 2011-01-29 LAB — DIFFERENTIAL
Basophils Absolute: 0 10*3/uL (ref 0.0–0.1)
Basophils Relative: 1 % (ref 0–1)
Eosinophils Absolute: 0.2 10*3/uL (ref 0.0–0.7)
Eosinophils Relative: 5 % (ref 0–5)
Lymphocytes Relative: 31 % (ref 12–46)
Lymphs Abs: 1.5 10*3/uL (ref 0.7–4.0)
Monocytes Absolute: 0.6 10*3/uL (ref 0.1–1.0)
Monocytes Relative: 12 % (ref 3–12)
Neutro Abs: 2.6 10*3/uL (ref 1.7–7.7)
Neutrophils Relative %: 52 % (ref 43–77)

## 2011-01-29 LAB — URINALYSIS, ROUTINE W REFLEX MICROSCOPIC
Bilirubin Urine: NEGATIVE
Glucose, UA: NEGATIVE mg/dL
Hgb urine dipstick: NEGATIVE
Ketones, ur: NEGATIVE mg/dL
Nitrite: NEGATIVE
Protein, ur: NEGATIVE mg/dL
Specific Gravity, Urine: 1.014 (ref 1.005–1.030)
Urobilinogen, UA: 1 mg/dL (ref 0.0–1.0)
pH: 6.5 (ref 5.0–8.0)

## 2011-01-29 LAB — URINE MICROSCOPIC-ADD ON

## 2011-01-29 LAB — GLUCOSE, CAPILLARY
Glucose-Capillary: 100 mg/dL — ABNORMAL HIGH (ref 70–99)
Glucose-Capillary: 106 mg/dL — ABNORMAL HIGH (ref 70–99)
Glucose-Capillary: 125 mg/dL — ABNORMAL HIGH (ref 70–99)
Glucose-Capillary: 148 mg/dL — ABNORMAL HIGH (ref 70–99)
Glucose-Capillary: 217 mg/dL — ABNORMAL HIGH (ref 70–99)
Glucose-Capillary: 90 mg/dL (ref 70–99)

## 2011-01-29 LAB — POCT CARDIAC MARKERS
CKMB, poc: 5.1 ng/mL (ref 1.0–8.0)
Myoglobin, poc: 118 ng/mL (ref 12–200)
Troponin i, poc: <0.05 ng/mL (ref 0.00–0.09)

## 2011-01-30 LAB — URINE CULTURE: Colony Count: 10000

## 2011-01-30 LAB — COMPREHENSIVE METABOLIC PANEL
ALT: 13 U/L (ref 0–35)
AST: 21 U/L (ref 0–37)
Albumin: 4.2 g/dL (ref 3.5–5.2)
Alkaline Phosphatase: 66 U/L (ref 39–117)
BUN: 20 mg/dL (ref 6–23)
CO2: 21 mEq/L (ref 19–32)
Calcium: 9.6 mg/dL (ref 8.4–10.5)
Chloride: 101 mEq/L (ref 96–112)
Creatinine, Ser: 0.98 mg/dL (ref 0.4–1.2)
GFR calc Af Amer: >60 mL/min (ref >60)
GFR calc non Af Amer: 54 mL/min — ABNORMAL LOW (ref >60)
Glucose, Bld: 90 mg/dL (ref 70–99)
Potassium: 4.4 mEq/L (ref 3.5–5.1)
Sodium: 132 mEq/L — ABNORMAL LOW (ref 135–145)
Total Bilirubin: 0.5 mg/dL (ref 0.3–1.2)
Total Protein: 7.5 g/dL (ref 6.0–8.3)

## 2011-01-30 LAB — GLUCOSE, CAPILLARY
Glucose-Capillary: 102 mg/dL — ABNORMAL HIGH (ref 70–99)
Glucose-Capillary: 112 mg/dL — ABNORMAL HIGH (ref 70–99)
Glucose-Capillary: 122 mg/dL — ABNORMAL HIGH (ref 70–99)
Glucose-Capillary: 127 mg/dL — ABNORMAL HIGH (ref 70–99)
Glucose-Capillary: 128 mg/dL — ABNORMAL HIGH (ref 70–99)
Glucose-Capillary: 164 mg/dL — ABNORMAL HIGH (ref 70–99)
Glucose-Capillary: 94 mg/dL (ref 70–99)

## 2011-01-30 LAB — CBC
HCT: 25.1 % — ABNORMAL LOW (ref 36.0–46.0)
HCT: 26.1 % — ABNORMAL LOW (ref 36.0–46.0)
HCT: 32 % — ABNORMAL LOW (ref 36.0–46.0)
Hemoglobin: 10.6 g/dL — ABNORMAL LOW (ref 12.0–15.0)
Hemoglobin: 8.4 g/dL — ABNORMAL LOW (ref 12.0–15.0)
Hemoglobin: 8.8 g/dL — ABNORMAL LOW (ref 12.0–15.0)
MCHC: 33 g/dL (ref 30.0–36.0)
MCHC: 33.5 g/dL (ref 30.0–36.0)
MCHC: 33.6 g/dL (ref 30.0–36.0)
MCV: 90.4 fL (ref 78.0–100.0)
MCV: 90.7 fL (ref 78.0–100.0)
MCV: 91.4 fL (ref 78.0–100.0)
Platelets: 182 10*3/uL (ref 150–400)
Platelets: 190 10*3/uL (ref 150–400)
Platelets: 233 10*3/uL (ref 150–400)
RBC: 2.77 MIL/uL — ABNORMAL LOW (ref 3.87–5.11)
RBC: 2.89 MIL/uL — ABNORMAL LOW (ref 3.87–5.11)
RBC: 3.49 MIL/uL — ABNORMAL LOW (ref 3.87–5.11)
RDW: 13.8 % (ref 11.5–15.5)
RDW: 13.9 % (ref 11.5–15.5)
RDW: 14.1 % (ref 11.5–15.5)
WBC: 3.5 10*3/uL — ABNORMAL LOW (ref 4.0–10.5)
WBC: 3.5 10*3/uL — ABNORMAL LOW (ref 4.0–10.5)
WBC: 5.2 10*3/uL (ref 4.0–10.5)

## 2011-01-30 LAB — DIFFERENTIAL
Basophils Absolute: 0 10*3/uL (ref 0.0–0.1)
Basophils Relative: 0 % (ref 0–1)
Eosinophils Absolute: 0.3 10*3/uL (ref 0.0–0.7)
Eosinophils Relative: 5 % (ref 0–5)
Lymphocytes Relative: 30 % (ref 12–46)
Lymphs Abs: 1.5 10*3/uL (ref 0.7–4.0)
Monocytes Absolute: 0.7 10*3/uL (ref 0.1–1.0)
Monocytes Relative: 14 % — ABNORMAL HIGH (ref 3–12)
Neutro Abs: 2.7 10*3/uL (ref 1.7–7.7)
Neutrophils Relative %: 51 % (ref 43–77)

## 2011-01-30 LAB — CK TOTAL AND CKMB (NOT AT ARMC)
CK, MB: 10.4 ng/mL (ref 0.3–4.0)
Relative Index: 4.2 — ABNORMAL HIGH (ref 0.0–2.5)
Total CK: 250 U/L — ABNORMAL HIGH (ref 7–177)

## 2011-01-30 LAB — CARDIAC PANEL(CRET KIN+CKTOT+MB+TROPI)
CK, MB: 5.7 ng/mL — ABNORMAL HIGH (ref 0.3–4.0)
CK, MB: 7.1 ng/mL (ref 0.3–4.0)
Relative Index: 3.6 — ABNORMAL HIGH (ref 0.0–2.5)
Relative Index: 3.7 — ABNORMAL HIGH (ref 0.0–2.5)
Total CK: 158 U/L (ref 7–177)
Total CK: 193 U/L — ABNORMAL HIGH (ref 7–177)
Troponin I: 0.01 ng/mL (ref 0.00–0.06)
Troponin I: <0.01 ng/mL (ref 0.00–0.06)

## 2011-01-30 LAB — URINALYSIS, ROUTINE W REFLEX MICROSCOPIC
Bilirubin Urine: NEGATIVE
Glucose, UA: NEGATIVE mg/dL
Hgb urine dipstick: NEGATIVE
Ketones, ur: NEGATIVE mg/dL
Nitrite: NEGATIVE
Protein, ur: NEGATIVE mg/dL
Specific Gravity, Urine: 1.012 (ref 1.005–1.030)
Urobilinogen, UA: 1 mg/dL (ref 0.0–1.0)
pH: 5 (ref 5.0–8.0)

## 2011-01-30 LAB — URINE MICROSCOPIC-ADD ON

## 2011-01-30 LAB — PROTIME-INR
INR: 1.14 (ref 0.00–1.49)
Prothrombin Time: 14.5 seconds (ref 11.6–15.2)

## 2011-01-30 LAB — TROPONIN I: Troponin I: <0.01 ng/mL (ref 0.00–0.06)

## 2011-01-30 LAB — HEPARIN LEVEL (UNFRACTIONATED): Heparin Unfractionated: 1.08 IU/mL — ABNORMAL HIGH (ref 0.30–0.70)

## 2011-02-03 LAB — GLUCOSE, CAPILLARY
Glucose-Capillary: 102 mg/dL — ABNORMAL HIGH (ref 70–99)
Glucose-Capillary: 106 mg/dL — ABNORMAL HIGH (ref 70–99)

## 2011-02-04 LAB — MRSA PCR SCREENING: MRSA by PCR: NEGATIVE

## 2011-02-04 LAB — URINALYSIS, ROUTINE W REFLEX MICROSCOPIC
Bilirubin Urine: NEGATIVE
Glucose, UA: NEGATIVE mg/dL
Hgb urine dipstick: NEGATIVE
Ketones, ur: NEGATIVE mg/dL
Nitrite: NEGATIVE
Protein, ur: NEGATIVE mg/dL
Specific Gravity, Urine: 1.008 (ref 1.005–1.030)
Urobilinogen, UA: 0.2 mg/dL (ref 0.0–1.0)
pH: 5 (ref 5.0–8.0)

## 2011-02-04 LAB — LIPID PANEL
Cholesterol: 135 mg/dL (ref 0–200)
HDL: 70 mg/dL (ref >39)
LDL Cholesterol: 54 mg/dL (ref 0–99)
Total CHOL/HDL Ratio: 1.9 RATIO
Triglycerides: 57 mg/dL (ref <150)
VLDL: 11 mg/dL (ref 0–40)

## 2011-02-04 LAB — CK TOTAL AND CKMB (NOT AT ARMC)
CK, MB: 7 ng/mL (ref 0.3–4.0)
Relative Index: 3.5 — ABNORMAL HIGH (ref 0.0–2.5)
Total CK: 199 U/L — ABNORMAL HIGH (ref 7–177)

## 2011-02-04 LAB — POCT CARDIAC MARKERS
CKMB, poc: 5.7 ng/mL (ref 1.0–8.0)
Myoglobin, poc: 121 ng/mL (ref 12–200)
Troponin i, poc: <0.05 ng/mL (ref 0.00–0.09)

## 2011-02-04 LAB — CBC
HCT: 28.9 % — ABNORMAL LOW (ref 36.0–46.0)
HCT: 29.4 % — ABNORMAL LOW (ref 36.0–46.0)
HCT: 31.2 % — ABNORMAL LOW (ref 36.0–46.0)
HCT: 32.8 % — ABNORMAL LOW (ref 36.0–46.0)
Hemoglobin: 10.1 g/dL — ABNORMAL LOW (ref 12.0–15.0)
Hemoglobin: 10.7 g/dL — ABNORMAL LOW (ref 12.0–15.0)
Hemoglobin: 9.5 g/dL — ABNORMAL LOW (ref 12.0–15.0)
Hemoglobin: 9.8 g/dL — ABNORMAL LOW (ref 12.0–15.0)
MCHC: 32.4 g/dL (ref 30.0–36.0)
MCHC: 32.6 g/dL (ref 30.0–36.0)
MCHC: 32.8 g/dL (ref 30.0–36.0)
MCHC: 33.3 g/dL (ref 30.0–36.0)
MCV: 91.5 fL (ref 78.0–100.0)
MCV: 91.7 fL (ref 78.0–100.0)
MCV: 91.8 fL (ref 78.0–100.0)
MCV: 92 fL (ref 78.0–100.0)
Platelets: 191 10*3/uL (ref 150–400)
Platelets: 195 10*3/uL (ref 150–400)
Platelets: 232 10*3/uL (ref 150–400)
Platelets: 248 10*3/uL (ref 150–400)
RBC: 3.15 MIL/uL — ABNORMAL LOW (ref 3.87–5.11)
RBC: 3.2 MIL/uL — ABNORMAL LOW (ref 3.87–5.11)
RBC: 3.41 MIL/uL — ABNORMAL LOW (ref 3.87–5.11)
RBC: 3.56 MIL/uL — ABNORMAL LOW (ref 3.87–5.11)
RDW: 14.1 % (ref 11.5–15.5)
RDW: 14.2 % (ref 11.5–15.5)
RDW: 14.3 % (ref 11.5–15.5)
RDW: 14.6 % (ref 11.5–15.5)
WBC: 4 10*3/uL (ref 4.0–10.5)
WBC: 4.4 10*3/uL (ref 4.0–10.5)
WBC: 5.1 10*3/uL (ref 4.0–10.5)
WBC: 5.4 10*3/uL (ref 4.0–10.5)

## 2011-02-04 LAB — BASIC METABOLIC PANEL
BUN: 14 mg/dL (ref 6–23)
BUN: 18 mg/dL (ref 6–23)
BUN: 18 mg/dL (ref 6–23)
CO2: 21 mEq/L (ref 19–32)
CO2: 23 mEq/L (ref 19–32)
CO2: 27 mEq/L (ref 19–32)
Calcium: 8.9 mg/dL (ref 8.4–10.5)
Calcium: 9.5 mg/dL (ref 8.4–10.5)
Calcium: 9.6 mg/dL (ref 8.4–10.5)
Chloride: 102 mEq/L (ref 96–112)
Chloride: 102 mEq/L (ref 96–112)
Chloride: 104 mEq/L (ref 96–112)
Creatinine, Ser: 0.87 mg/dL (ref 0.4–1.2)
Creatinine, Ser: 0.92 mg/dL (ref 0.4–1.2)
Creatinine, Ser: 1.02 mg/dL (ref 0.4–1.2)
GFR calc Af Amer: >60 mL/min (ref >60)
GFR calc Af Amer: >60 mL/min (ref >60)
GFR calc Af Amer: >60 mL/min (ref >60)
GFR calc non Af Amer: 52 mL/min — ABNORMAL LOW (ref >60)
GFR calc non Af Amer: 58 mL/min — ABNORMAL LOW (ref >60)
GFR calc non Af Amer: >60 mL/min (ref >60)
Glucose, Bld: 106 mg/dL — ABNORMAL HIGH (ref 70–99)
Glucose, Bld: 113 mg/dL — ABNORMAL HIGH (ref 70–99)
Glucose, Bld: 96 mg/dL (ref 70–99)
Potassium: 3.8 mEq/L (ref 3.5–5.1)
Potassium: 4.1 mEq/L (ref 3.5–5.1)
Potassium: 4.3 mEq/L (ref 3.5–5.1)
Sodium: 132 mEq/L — ABNORMAL LOW (ref 135–145)
Sodium: 135 mEq/L (ref 135–145)
Sodium: 136 mEq/L (ref 135–145)

## 2011-02-04 LAB — GLUCOSE, CAPILLARY
Glucose-Capillary: 121 mg/dL — ABNORMAL HIGH (ref 70–99)
Glucose-Capillary: 123 mg/dL — ABNORMAL HIGH (ref 70–99)
Glucose-Capillary: 125 mg/dL — ABNORMAL HIGH (ref 70–99)
Glucose-Capillary: 130 mg/dL — ABNORMAL HIGH (ref 70–99)
Glucose-Capillary: 146 mg/dL — ABNORMAL HIGH (ref 70–99)
Glucose-Capillary: 147 mg/dL — ABNORMAL HIGH (ref 70–99)
Glucose-Capillary: 177 mg/dL — ABNORMAL HIGH (ref 70–99)
Glucose-Capillary: 213 mg/dL — ABNORMAL HIGH (ref 70–99)
Glucose-Capillary: 92 mg/dL (ref 70–99)

## 2011-02-04 LAB — CARDIAC PANEL(CRET KIN+CKTOT+MB+TROPI)
CK, MB: 5.1 ng/mL — ABNORMAL HIGH (ref 0.3–4.0)
CK, MB: 5.3 ng/mL — ABNORMAL HIGH (ref 0.3–4.0)
Relative Index: 3.1 — ABNORMAL HIGH (ref 0.0–2.5)
Relative Index: 3.2 — ABNORMAL HIGH (ref 0.0–2.5)
Total CK: 159 U/L (ref 7–177)
Total CK: 172 U/L (ref 7–177)
Troponin I: <0.01 ng/mL (ref 0.00–0.06)
Troponin I: <0.01 ng/mL (ref 0.00–0.06)

## 2011-02-04 LAB — URINE CULTURE: Colony Count: 2000

## 2011-02-04 LAB — APTT: aPTT: 33 seconds (ref 24–37)

## 2011-02-04 LAB — DIFFERENTIAL
Basophils Absolute: 0 10*3/uL (ref 0.0–0.1)
Basophils Relative: 1 % (ref 0–1)
Eosinophils Absolute: 0.2 10*3/uL (ref 0.0–0.7)
Eosinophils Relative: 4 % (ref 0–5)
Lymphocytes Relative: 39 % (ref 12–46)
Lymphs Abs: 1.6 10*3/uL (ref 0.7–4.0)
Monocytes Absolute: 0.6 10*3/uL (ref 0.1–1.0)
Monocytes Relative: 14 % — ABNORMAL HIGH (ref 3–12)
Neutro Abs: 1.6 10*3/uL — ABNORMAL LOW (ref 1.7–7.7)
Neutrophils Relative %: 42 % — ABNORMAL LOW (ref 43–77)

## 2011-02-04 LAB — TSH: TSH: 2.809 u[IU]/mL (ref 0.350–4.500)

## 2011-02-04 LAB — PROTIME-INR
INR: 1.07 (ref 0.00–1.49)
Prothrombin Time: 13.8 seconds (ref 11.6–15.2)

## 2011-02-04 LAB — TROPONIN I: Troponin I: <0.01 ng/mL (ref 0.00–0.06)

## 2011-02-04 LAB — HEPARIN LEVEL (UNFRACTIONATED)
Heparin Unfractionated: 0.55 IU/mL (ref 0.30–0.70)
Heparin Unfractionated: 0.67 IU/mL (ref 0.30–0.70)
Heparin Unfractionated: 0.81 IU/mL — ABNORMAL HIGH (ref 0.30–0.70)

## 2011-02-12 ENCOUNTER — Other Ambulatory Visit: Payer: Self-pay | Admitting: Endocrinology

## 2011-02-13 ENCOUNTER — Encounter: Payer: Self-pay | Admitting: Cardiology

## 2011-02-14 ENCOUNTER — Ambulatory Visit (INDEPENDENT_AMBULATORY_CARE_PROVIDER_SITE_OTHER): Payer: Medicare Other | Admitting: Cardiology

## 2011-02-14 ENCOUNTER — Encounter: Payer: Self-pay | Admitting: Cardiology

## 2011-02-14 VITALS — BP 142/72 | HR 71 | Resp 18 | Ht 62.0 in | Wt 127.8 lb

## 2011-02-14 DIAGNOSIS — R079 Chest pain, unspecified: Secondary | ICD-10-CM

## 2011-02-14 DIAGNOSIS — I2510 Atherosclerotic heart disease of native coronary artery without angina pectoris: Secondary | ICD-10-CM

## 2011-02-14 DIAGNOSIS — R0789 Other chest pain: Secondary | ICD-10-CM

## 2011-02-14 DIAGNOSIS — I10 Essential (primary) hypertension: Secondary | ICD-10-CM

## 2011-02-14 DIAGNOSIS — K219 Gastro-esophageal reflux disease without esophagitis: Secondary | ICD-10-CM

## 2011-02-14 MED ORDER — PANTOPRAZOLE SODIUM 40 MG PO TBEC: 40.0000 mg | DELAYED_RELEASE_TABLET | Freq: Every day | ORAL | Status: DC

## 2011-02-14 NOTE — Assessment & Plan Note (Signed)
Stable, no change in treatment.

## 2011-02-14 NOTE — Progress Notes (Signed)
   Patient ID: Armed forces operational officer, female    DOB: 1925/06/18, 77 y.o.   MRN: [id number]  HPI Mrs Circle returns for E and M of her CAD. She has lots of belching and burping with mid substernal chest discomfort. No exertional sxs or angina. She denies nausea and vomiting, no change in bowel habits, no weight loss, and no melena. Takes OTC Pepcid.  EKG shows NSR with first degree AVB, no changes.   Review of Systems  All other systems reviewed and are negative.      Physical Exam  Nursing note and vitals reviewed. Constitutional: She is oriented to person, place, and time. She appears well-developed and well-nourished.  HENT:  Head: Normocephalic and atraumatic.  Eyes: EOM are normal. Pupils are equal, round, and reactive to light.  Neck: Neck supple. No JVD present. No tracheal deviation present. No thyromegaly present.  Cardiovascular: Normal rate, regular rhythm, normal heart sounds and intact distal pulses.   Pulmonary/Chest: Effort normal and breath sounds normal. She has no wheezes. She has no rales. She exhibits no tenderness.  Abdominal: Soft. Bowel sounds are normal.  Musculoskeletal: Normal range of motion. She exhibits no edema.  Neurological: She is alert and oriented to person, place, and time.  Skin: Skin is warm and dry.  Psychiatric: She has a normal mood and affect.

## 2011-02-14 NOTE — Patient Instructions (Signed)
Your physician recommends that you schedule a follow-up appointment in: 6 months with Dr. Wall  

## 2011-02-14 NOTE — Assessment & Plan Note (Signed)
Will change her Pepcid to Protonix 40mg /day.

## 2011-02-18 LAB — POCT I-STAT, CHEM 8
BUN: 19 mg/dL (ref 6–23)
Calcium, Ion: 1.21 mmol/L (ref 1.12–1.32)
Chloride: 102 mEq/L (ref 96–112)
Creatinine, Ser: 0.9 mg/dL (ref 0.4–1.2)
Glucose, Bld: 104 mg/dL — ABNORMAL HIGH (ref 70–99)
HCT: 33 % — ABNORMAL LOW (ref 36.0–46.0)
Hemoglobin: 11.2 g/dL — ABNORMAL LOW (ref 12.0–15.0)
Potassium: 4 mEq/L (ref 3.5–5.1)
Sodium: 137 mEq/L (ref 135–145)
TCO2: 23 mmol/L (ref 0–100)

## 2011-02-18 LAB — POCT CARDIAC MARKERS
CKMB, poc: 2.1 ng/mL (ref 1.0–8.0)
Myoglobin, poc: 118 ng/mL (ref 12–200)
Troponin i, poc: <0.05 ng/mL (ref 0.00–0.09)

## 2011-03-01 ENCOUNTER — Telehealth: Payer: Self-pay | Admitting: *Deleted

## 2011-03-01 DIAGNOSIS — D509 Iron deficiency anemia, unspecified: Secondary | ICD-10-CM

## 2011-03-01 NOTE — Telephone Encounter (Signed)
Spoke with patient to advise her that it is time for her repeat CBC on 03/07/11. She states that she will come to the lab that day to have her blood work completed.

## 2011-03-06 ENCOUNTER — Other Ambulatory Visit (INDEPENDENT_AMBULATORY_CARE_PROVIDER_SITE_OTHER): Payer: Medicare Other

## 2011-03-06 DIAGNOSIS — D509 Iron deficiency anemia, unspecified: Secondary | ICD-10-CM

## 2011-03-06 LAB — CBC WITH DIFFERENTIAL/PLATELET
Basophils Absolute: 0 10*3/uL (ref 0.0–0.1)
Basophils Relative: 0.4 % (ref 0.0–3.0)
Eosinophils Absolute: 0.3 10*3/uL (ref 0.0–0.7)
Eosinophils Relative: 5.9 % — ABNORMAL HIGH (ref 0.0–5.0)
HCT: 35.3 % — ABNORMAL LOW (ref 36.0–46.0)
Hemoglobin: 11.8 g/dL — ABNORMAL LOW (ref 12.0–15.0)
Lymphocytes Relative: 32.4 % (ref 12.0–46.0)
Lymphs Abs: 1.4 10*3/uL (ref 0.7–4.0)
MCHC: 33.5 g/dL (ref 30.0–36.0)
MCV: 93.4 fl (ref 78.0–100.0)
Monocytes Absolute: 0.5 10*3/uL (ref 0.1–1.0)
Monocytes Relative: 10.6 % (ref 3.0–12.0)
Neutro Abs: 2.2 10*3/uL (ref 1.4–7.7)
Neutrophils Relative %: 50.7 % (ref 43.0–77.0)
Platelets: 226 10*3/uL (ref 150.0–400.0)
RBC: 3.78 Mil/uL — ABNORMAL LOW (ref 3.87–5.11)
RDW: 13.2 % (ref 11.5–14.6)
WBC: 4.4 10*3/uL — ABNORMAL LOW (ref 4.5–10.5)

## 2011-03-07 ENCOUNTER — Telehealth: Payer: Self-pay | Admitting: *Deleted

## 2011-03-07 DIAGNOSIS — D649 Anemia, unspecified: Secondary | ICD-10-CM

## 2011-03-07 NOTE — Telephone Encounter (Signed)
Patient given results as per Dr. Juanda Chance. Lab in [facility] for 06/06/11. Note to remind patient.

## 2011-03-07 NOTE — Telephone Encounter (Signed)
Message copied by Jesse Fall on Thu Mar 07, 2011  9:32 AM ------      Message from: Lina Sar      Created: Wed Mar 06, 2011 11:18 PM       Please call pt with stable, slightly low CBC. Repeat CBC in 3 months.

## 2011-03-26 NOTE — Assessment & Plan Note (Signed)
OFFICE VISIT   [name], [name]  DOB:  1925/03/07                                       02/22/2009  JWJXB#:[id number]   The patient is an 77 year old female referred by Dr. Leeanne Deed for right  ankle pain.  This apparently has been present for greater than 3 months.  She is currently wearing a boot to help this.  She recently had ABIs  performed and is sent here for further evaluation.  She denies any  nonhealing ulcers on the feet.  She denies any claudication symptoms.  Her atherosclerotic risk factors include hypertension, elevated  cholesterol and diabetes.  She also has advanced age.  She does not have  a history of tobacco abuse.   PAST SURGICAL HISTORY:  She had a right ankle operation and she had a  right leg vein stripping.   MEDICATIONS:  1. Lisinopril/hydrochlorothiazide 20/12.5 once a day.  2. Toprol XL 50 mg once a day.  3. Verapamil ER 240 mg once a day.  4. Metformin ER 500 mg once a day.  5. Pravastatin 40 mg once a day.   She has listed as side effects or allergies the following:  Penicillin  causes itching; Betadine, clindamycin, Feldene, erythromycin, Celebrex,  Macrobid all cause itching; Sulfa makes her feel bad.   FAMILY HISTORY:  Unremarkable.   SOCIAL HISTORY:  She is widowed.  She is retired.  She is a nonsmoker,  nonconsumer of alcohol.   REVIEW OF SYSTEMS:  She is 5 feet 2 inches, 120 pounds.  HEMATOLOGIC, ENT, PSYCHIATRIC, ORTHOPEDIC, NEUROLOGIC, RENAL, GI,  PULMONARY, CARDIAC:  All negative.   PHYSICAL EXAM:  Blood pressure is 164/81 in the left arm, heart rate 69  and regular,  HEENT:  Unremarkable.  Neck:  Has 2+ carotid pulses  without bruit.  Chest:  Clear to auscultation.  CARDIAC:  Regular rate  and rhythm without murmur.  Abdomen:  Soft, nontender, nondistended with  no masses.  Extremities:  She has 2+ radial, femoral pulses bilaterally.  She has a 2+ right popliteal and a 1+ left popliteal pulse.  She has 1+  dorsalis pedis pulses bilaterally with absent posterior tibial pulses.   She had bilateral ABIs with toe pressures performed on January 24, 2009.  This showed an ABI on the right side of 1.07 and on the left of 0.97,  toe pressure on the right was 72 and on the left it was 65.   SUMMARY:  The patient is fairly asymptomatic but does have evidence of  distal atherosclerotic occlusive disease.  She has normal ABIs  bilaterally but with decreased digit pressures.  However, these digit  pressures are high enough that she should be able to heal any wound  spontaneously.  She probably has small vessel disease secondary to her  diabetes and advanced age.  I believe the best option for her is  continued conservative management with continued foot care by Dr.  Leeanne Deed.  She will follow up with me on an as-needed basis.  She will  continue to follow with Dr. Everardo All for risk factor modification of her  hypertension, diabetes and elevated cholesterol.  She was also counseled  today on protection of her feet to try to prevent any ulcerations from  occurring.   Janetta Hora. Fields, MD  Electronically Signed   CEF/MEDQ  D:  02/27/2009  T:  02/28/2009  Job:  [id number]   cc:   Fanny Bien. Tuchman, D.P.M.  Sean A. Everardo All, MD

## 2011-03-26 NOTE — Assessment & Plan Note (Signed)
[facility]                            CARDIOLOGY OFFICE NOTE   NAME:SURGEONAyona, Yniguez                     MRN:          [id number]  DATE:09/02/2008                            DOB:          [birth year]    [name] comes in today for followup.  She is doing remarkably well.  For her age, she stays extremely active and says I am just not going to  slow down.   She has a history of hypertension and palpitations.  She has had no  angina, ischemia, or ischemic symptoms.  She denies any tachy  palpitations, presyncope, or syncope.   Her medications are:  1. Toprol-XL 50 mg a day.  2. Verapamil SR 240 mg a day.  3. Lisinopril/hydrochlorothiazide 20/12.5 two daily.  4. Metformin extended release 1000 b.i.d.  5. Pravastatin 40 mg a day.   Her blood pressure is 140/62, her pulse is 69 and regular.  Her EKG  shows normal sinus rhythm with poor R-wave progression in the anterior  precordium which is old.  There is no change.  Weight is down 9 pounds  to 131, but stable from last year.  HEENT is normal.  Carotid upstrokes  are equal bilaterally without bruits.  No JVD.  Thyroid is not enlarged.  Trachea is midline.  Lungs are clear.  Heart reveals a regular rate and  rhythm.  No murmur, rub, or gallop.  Abdominal exam is soft, good bowel  sounds.  No midline or pulsatile mass.  Extremities reveal no edema.  Pulses are intact.  Neuro exam is intact.   ASSESSMENT AND PLAN:  [name] is doing well.  I have made no changes  in her medical program.  We will plan on seeing her back again in a  year.     Thomas C. Daleen Squibb, MD, [facility]  Electronically Signed    TCW/MedQ  DD: 09/02/2008  DT: 09/02/2008  Job #: [id number]   cc:   Gregary Signs A. Everardo All, MD

## 2011-03-26 NOTE — Assessment & Plan Note (Signed)
[facility]                            CARDIOLOGY OFFICE NOTE   NAME:SURGEONLatarra, Eagleton                     MRN:          [id number]  DATE:09/18/2007                            DOB:          1925/10/04    [name] comes in today for further management of her hypertension  and palpitations.   She is doing remarkably well.  She stays very active, according to her  daughter, who is with her today.  She is having no significant  palpitations, except on rare occasion.   Her medicines include:   1. Toprol XL 50 mg a day.  2. Verapamil-SR 240 mg a day.  3. Metformin extended release 500 mg p.o. b.i.d.  4. Actos 15 mg a day.  5. Lisinopril/HCTZ 20/12.5 two daily.   Her blood pressure today is 144/71, her pulse is 74 and regular, her  weight is 140, up 9.  HEENT:  Normocephalic, atraumatic.  PERRLA.  Extraocular movements  intact.  Sclerae are clear.  Facial symmetry is normal.  SKIN:  Her skin is immaculate, as usual.  NECK:  Supple.  Carotid upstrokes are equal bilaterally without bruits.  No JVD.  Thyroid is not enlarged.  Trachea is midline.  LUNGS:  Clear.  HEART:  Reveals a nondisplaced PMI, normal S1 and S2, no gallop.  ABDOMINAL EXAM:  Soft, good bowel sounds.  EXTREMITIES:  Reveal no edema.  Pulses are brisk.  NEUROLOGIC EXAM:  Intact.   EKG shows normal sinus rhythm with a borderline first degree AV block,  no changes from before.   Ms. Tan is doing remarkably well.  I have made no changes in her  medical program.  We will see her back in a year.     Thomas C. Daleen Squibb, MD, [facility]  Electronically Signed    TCW/MedQ  DD: 09/18/2007  DT: 09/19/2007  Job #: [id number]   cc:   Vale Haven. Andrey Campanile, M.D.

## 2011-03-28 ENCOUNTER — Other Ambulatory Visit: Payer: Self-pay | Admitting: Gynecology

## 2011-03-28 DIAGNOSIS — Z1231 Encounter for screening mammogram for malignant neoplasm of breast: Secondary | ICD-10-CM

## 2011-03-29 ENCOUNTER — Ambulatory Visit (INDEPENDENT_AMBULATORY_CARE_PROVIDER_SITE_OTHER): Payer: Medicare Other | Admitting: Endocrinology

## 2011-03-29 ENCOUNTER — Other Ambulatory Visit (INDEPENDENT_AMBULATORY_CARE_PROVIDER_SITE_OTHER): Payer: Medicare Other

## 2011-03-29 ENCOUNTER — Encounter: Payer: Self-pay | Admitting: Endocrinology

## 2011-03-29 DIAGNOSIS — E119 Type 2 diabetes mellitus without complications: Secondary | ICD-10-CM

## 2011-03-29 DIAGNOSIS — D509 Iron deficiency anemia, unspecified: Secondary | ICD-10-CM

## 2011-03-29 DIAGNOSIS — D518 Other vitamin B12 deficiency anemias: Secondary | ICD-10-CM

## 2011-03-29 LAB — HEMOGLOBIN A1C: Hgb A1c MFr Bld: 7.3 % — ABNORMAL HIGH (ref 4.6–6.5)

## 2011-03-29 MED ORDER — TRAMADOL HCL 50 MG PO TABS: 50.0000 mg | ORAL_TABLET | Freq: Four times a day (QID) | ORAL | Status: AC | PRN

## 2011-03-29 MED ORDER — OMEPRAZOLE 20 MG PO TBEC: 1.0000 | DELAYED_RELEASE_TABLET | Freq: Every day | ORAL | Status: DC

## 2011-03-29 MED ORDER — BROMOCRIPTINE MESYLATE 2.5 MG PO TABS: 1.2500 mg | ORAL_TABLET | Freq: Every day | ORAL | Status: DC

## 2011-03-29 MED ORDER — CYANOCOBALAMIN 1000 MCG/ML IJ SOLN
1000.0000 ug | Freq: Once | INTRAMUSCULAR | Status: AC
  Administered 2011-03-29: 1000 ug via INTRAMUSCULAR

## 2011-03-29 NOTE — Patient Instructions (Addendum)
blood tests are being ordered for you today.  please call [phone] to hear your test results.  You will be prompted to enter the 9-digit "MRN" number that appears at the top left of this page, followed by #.  Then you will hear the message. Based on the results, i may advise addition of "bromocriptine," 1/2 pill every night. Change pantoprazole to omeprazole 20 mg daily. i have also sent a prescription to your pharmacy for back pain. Please make a follow-up appointment in 4 months. (update: i left message on phone-tree:  Start parlodel as above).

## 2011-03-29 NOTE — Assessment & Plan Note (Signed)
[facility]                                   ON-CALL NOTE   NAME:SURGEONTaleisha, [name]                     MRN:          [id number]  DATE:07/14/2006                            DOB:          1924-12-29    TIME:  2:19 PM   PHONE NUMBER:  [phone]   CALLER:  [name], daughter.   OBJECTIVE:  The patient has shingles diagnosed last week.  She was given  Valtrex, which she has now finished and is having pain for which she was  given nothing.  It is underneath the right breast where she has a rash.  She  is also on Toprol, Glucophage, verapamil, and Lipitor.  She is a diabetic  hypertensive.  She has tried nothing for the discomfort.   ASSESSMENT:  Shingles in thoracic distribution.   PLAN:  Try Tylenol Extra Strength 2 500-mg tablets 4 times a day.  If no  help, call back and can be prescribed something a little stronger.   PRIMARY CARE [name]:  Dr. Everardo [name].   HOME OFFICE:  [name]                                   Andrea Silence, MD   RNS/MedQ  DD:  07/14/2006  DT:  07/14/2006  Job #:  [id number]   cc:   [name] A. Andrea All, MD

## 2011-03-29 NOTE — Procedures (Signed)
[facility]  Patient:    [name], [name] Visit Number: [id number] MRN: [id number]          Service Type: END Location: ENDO Attending Physician:  Louie Bun Dictated by:   Everardo All Madilyn Fireman, M.D. Proc. Date: 04/15/02 Admit Date:  04/15/2002   CC:         Thornton Park. Daphine Deutscher, M.D.   Procedure Report  PROCEDURE:  Esophagogastroduodenoscopy with small bowel enteroscopy.  INDICATION FOR PROCEDURE:  The patient with unusual presentation of intermittent abdominal pain with upper GI small bowel series showing distal duodenal diverticulum or diverticula with suggestion of jejunal malrotation. Endoscopic evaluation was requested for correlation.  DESCRIPTION OF PROCEDURE:  The patient was placed in the left lateral decubitus position and placed on the pulse monitor with continuous low-flow oxygen delivered by nasal cannula.  She was sedated with 40 mg IV Demerol and 4 mg IV Versed.  The Olympus video pediatric colonoscope was advanced under direct vision into the oropharynx and esophagus.  The esophagus was straight and of normal caliber with the squamocolumnar line at 38 cm.  There may have been a very patent, thin, lower esophageal ring at the GE junction.  The stomach was entered, and a small amount of liquid secretions were suctioned from the fundus.  Retroflexed view of the cardia was unremarkable.  The fundus, body, antrum, and pylorus all appeared normal.  The duodenum was entered, and both the bulb and second portion were well-inspected and appeared to be within normal limits.  The colonoscope was advanced a significant distance down into the distal duodenum and possibly jejunum.  I could not comment on positioning of the jejunum.  At about 90 cm, there was a single, large diverticulum seen that was not causing any obstruction and was not filled with any material.  Otherwise, the small bowel mucosa appeared normal. The scope was then withdrawn,  and the apartment returned to the recovery room in stable condition.  She tolerated the procedure well, and there were no immediate complications.  IMPRESSION: 1. A single, distal duodenal or proximal jejunal diverticulum. 2. Otherwise normal study.  PLAN:  We will discuss further work-up with Dr. Daphine Deutscher. Dictated by:   Everardo All Madilyn Fireman, M.D. Attending Physician:  Louie Bun DD:  04/15/02 TD:  04/17/02 Job: [id number]

## 2011-03-29 NOTE — Op Note (Signed)
[facility]  Patient:    [name], [name] Visit Number: [id number] MRN: [id number]          Service Type: SUR Location: 3W 0378 01 Attending Physician:  Katha Cabal Dictated by:   Thornton Park Daphine Deutscher, M.D. Proc. Date: 05/24/02 Admit Date:  05/24/2002   CC:         Duffy Rhody C. Andrey Campanile, M.D.  Thomas C. Wall, M.D. [facility]  John C. Madilyn Fireman, M.D.   Operative Report  PREOPERATIVE DIAGNOSIS:  Multiple small gallstones.  POSTOPERATIVE DIAGNOSIS:  Chronic cholecystitis.  OPERATION PERFORMED:  Laparoscopic cholecystectomy with intraoperative cholangiogram.  Loewen:  Thornton Park. Daphine Deutscher, M.D.  ASSISTANT:  Rose Phi. Maple Hudson, M.D.  ANESTHESIA:  General endotracheal.  INDICATIONS FOR PROCEDURE:  The patient is a 77 year old lady who has a strong family history of multiple gallstones and gallbladder attacks.  DESCRIPTION OF PROCEDURE:  She was taken to room one and given general anesthesia.  The abdomen was prepped and draped. The patient has a latex allergy and latex-free products were used.  Longitudinal incision was made down in the umbilicus and a small umbilical hernia was used to gain access to the abdomen and was opened.  This was subsequently closed with not only a pursestring suture of Vicryl but a simple suture of Vicryl.  However, in the meantime, a Hasson cannula was inserted through the pursestring suture and the abdomen was insufflated.  Three trocars were placed in the upper abdomen including two 5 mm laterally and a 10-11 in the upper midline.  The gallbladder was tucked beneath the liver edge and was smaller but there were numerous adhesions about the fundus.  It was grasped and elevated and these chronic adhesions were stripped away.  I then was able to reflect the infundibulum laterally and dissect a kind of a long skinny cystic duct out. The cystic arteries were noted.  A clip was placed upon the gallbladder and I incised and did a  cholangiogram first and the catheter came out and extravasated but then I put it in and clipped it and a good cholangiogram showed prompt filling of the common duct with intrahepatic filling and flow into the duodenum.  The patient has a history of a duodenal diverticulum which did not fill.  There was also some jejunal  orientation differences with possible partial malrotation.  The cystic duct was then triple clipped, divided.  The cystic artery was then clipped and divided and the gallbladder was removed from the gallbladder bed without entering it using the hook electrocautery.  It was brought out through the umbilicus without difficulty. The gallbladder bed was reinspected and no bleeding or bile leaks were noted. Port sites were withdrawn and the abdomen was deflated.  As mentioned before, the umbilical repair was effected.  The skin was closed with 4-0 Vicryl with benzoin and Steri-Strips.  The patient seemed to tolerate the procedure well and was taken to the PACU in satisfactory condition. Dictated by:   Thornton Park Daphine Deutscher, M.D. Attending Physician:  Katha Cabal DD:  05/24/02 TD:  05/24/02 Job: [id number]

## 2011-03-29 NOTE — Discharge Summary (Signed)
[facility]  Patient:    [name], [name]                     MRN: [id number] Adm. Date:  [id number] Disc. Date: [id number] Attending:  Learta Codding Dictator:   Abelino Derrick, P.A.-C. [facility] CC:         [place]. Andrey Campanile, M.D., [address] [place], [place] [address]   Discharge Summary  DISCHARGE DIAGNOSES: 1. Chest pain, negative Cardiolite this admission. 2. Hypertension. 3. Elevated thyroid-stimulating hormone this admission. 4. History of paroxysmal atrial fibrillation in 1995. 5. History of nonobstructive coronary disease in 1996.  HOSPITAL COURSE:  The patient is a 77 year old female with a history as noted above, who presented June 25, 2001, with chest pain.  Please see admission history and physical for details.  She was admitted to telemetry.  CK-MBs were obtained.  She also given Lovenox in the emergency room.  Her CKs were elevated, but her MBs and troponins were negative.  TSH done the morning of the 16th was somewhat elevated at 8.9.  Cardiolite study was done which showed no ischemia and preserved LV function.  Dr. Dietrich Pates feels she can have her TSH rechecked by her primary care doctor as an outpatient.  Whether or not this contributed to her elevated CK is unclear.  We are going to discharge her late on the 16th on her usual medications.  DISCHARGE MEDICATIONS: 1. Toprol XL 50 mg q.d. 2. Verapamil SR 240 q.d. 3. Aspirin 81 mg q.d. 4. Aceon 4 mg q.d.  LABORATORY DATA:  Hemoglobin 10.7, hematocrit 31.7, platelets 214, potassium 3.6, BUN 15, creatinine 0.7.  CKs were in the 300 range, but MBs and troponins were negative.  EKG showed sinus rhythm with minimal voltage criteria for LVH.  DISPOSITION:  The patient is discharged in stable condition and will follow up with Dr. Karma Ganja in a couple of weeks.  She will contact the office to see Dr. Daleen Squibb in a few months. DD:  06/26/01 TD:  06/27/01 Job: [id number]

## 2011-03-29 NOTE — Progress Notes (Signed)
Subjective:    Patient ID: Armed forces operational officer, female    DOB: 03/30/1925, 77 y.o.   MRN: [id number]  HPI The state of at least three ongoing medical problems is addressed today: pt states she feels well in general.  She did not start the parlodel as rx'ed.  no cbg record, but states cbg's are well-controlled. Pt says her dyspeptic sxs are much better with protonix, but she c/o the expense of the med.  Denies dysphagia and brbpr.  Chronic pain at the lower back persits Past Medical History  Diagnosis Date  . Arrhythmia     paroxysmal atrial fibrillation  . Coronary artery disease   . Hypertension   . Hyperlipidemia   . Edema   . Leg pain, bilateral   . Palpitations   . GERD (gastroesophageal reflux disease)   . Anemia B twelve deficiency   . Hx of colonic polyps   . Dizziness - giddy   . Abdominal pain   . Encounter for long-term (current) use of other medications   . Iron deficiency anemia, unspecified   . Pernicious anemia   . Cough   . Acute cystitis   . Thoracic or lumbosacral neuritis or radiculitis, unspecified   . Pain in joint, lower leg   . Symptomatic menopausal or female climacteric states   . Lumbago   . Unspecified disorder of gallbladder   . Degeneration of lumbar or lumbosacral intervertebral disc   . Constipation   . Arthropathy, unspecified, site unspecified   . Benign neoplasm of colon   . Family history of malignant neoplasm of gastrointestinal tract   . Personal history of other diseases of digestive system   . Diabetes mellitus without mention of complication   . Allergic rhinitis, cause unspecified     Past Surgical History  Procedure Date  . Abdominal hysterectomy 1974    and appendectomy  . Cholecystectomy   . Back surgery     History   Social History  . Marital Status: Widowed    Spouse Name: N/A    Number of Children: N/A  . Years of Education: N/A   Occupational History  . Not on file.   Social History Main Topics  . Smoking  status: Never Smoker   . Smokeless tobacco: Not on file  . Alcohol Use: Not on file  . Drug Use: Not on file  . Sexually Active: Not on file   Other Topics Concern  . Not on file   Social History Narrative  . No narrative on file    Current Outpatient Prescriptions on File Prior to Visit  Medication Sig Dispense Refill  . aspirin 81 MG EC tablet Take 81 mg by mouth daily.        . iron polysaccharides (NIFEREX) 150 MG capsule Take 150 mg by mouth. 1 tablet once daily       . lisinopril-hydrochlorothiazide (PRINZIDE,ZESTORETIC) 20-12.5 MG per tablet TAKE 1 TABLET BY MOUTH ONCE DAILY  30 tablet  5  . loratadine-pseudoephedrine (CLARITIN-D 24-HOUR) 10-240 MG per 24 hr tablet Take 1 tablet by mouth. 1 daily as needed for congestion       . metFORMIN (GLUCOPHAGE-XR) 500 MG 24 hr tablet Take 500 mg by mouth. 1 tablet two times a day       . metoprolol (TOPROL-XL) 50 MG 24 hr tablet Take 50 mg by mouth daily.        . nitroGLYCERIN (NITROSTAT) 0.4 MG SL tablet Place 0.4 mg under the tongue every 5 (  five) minutes as needed.        . pravastatin (PRAVACHOL) 40 MG tablet Take 40 mg by mouth daily. 2 at bedtime       . sitaGLIPtan (JANUVIA) 100 MG tablet Take 100 mg by mouth daily.          Allergies  Allergen Reactions  . Celecoxib     REACTION: rash  . Erythromycin     REACTION: itching  . Nitrofurantoin     REACTION: rash  . Penicillins     REACTION: swelling of joints  . Piroxicam   . Povidone-Iodine     REACTION: itching  . Sulfonamide Derivatives     REACTION: "like my head was full of water"    No family history on file.  BP 136/72  Pulse 66  Temp(Src) 97 F (36.1 C) (Oral)  Ht 5\' 2"  (1.575 m)  Wt 126 lb 6.4 oz (57.335 kg)  BMI 23.12 kg/m2  SpO2 94%    Review of Systems Denies weight change and numbness.    Objective:   Physical Exam Pulses: dorsalis pedis intact bilat.   Feet: no deformity.  no ulcer on the feet.  feet are of normal color and temp.  no  edema Neuro: sensation is intact to touch on the feet    Lab Results  Component Value Date   HGBA1C 7.3* 03/29/2011     Assessment & Plan:  Genella Rife.  egd is unlikely to be of benefit in pt of her age and health Dm, needs increased rx Low-back pain, persistent

## 2011-03-29 NOTE — Assessment & Plan Note (Signed)
[facility]                              CARDIOLOGY OFFICE NOTE   NAME:SURGEONAijah, Lattner                     MRN:          [id number]  DATE:09/11/2006                            DOB:          09/19/1925    Ms. Munn returns today for further management of her palpitations and  hypertension.  She is being followed now by [facility] A. Everardo All, MD, with Dr.  Margrett Rud retiring.   She is having no chest pain, very few palpitations.  Her blood pressure, she  stated, is under good control.  She was up a bit in the office.   MEDICATIONS:  1. Toprol XL 50 mg daily.  2. Verapamil SR 240 mg p.o. daily.  3. Lisinopril/hydrochlorothiazide 20/12.5 mg daily.  4. Metformin extended release 500 mg b.i.d.  5. Dr. Everardo All just gave her Actos 15 mg a day.  She wanted to know if      that was okay.  I confirmed.   I have asked her to take a coated aspirin in the past.  Because of bruising,  she does not do this.   PHYSICAL EXAMINATION:  VITAL SIGNS:  Her blood pressure today is elevated at  167/82, her pulse is 71 and regular, her weight is 131.  SKIN:  Warm and dry.  HEENT:  Unremarkable.  Pupils equal, round, responsive to light and  accommodation.  Extraocular movements intact.  Sclerae are clear.  NECK:  Carotids are full.  There is no JVD.  There are no bruits.  There is  no thyromegaly.  LUNGS:  Clear.  CARDIAC:  Heart reveals a regular rate and rhythm without gallop, rub or  murmur.  ABDOMEN:  Soft, good bowel sounds.  There is no tenderness.  EXTREMITIES:  No clubbing, cyanosis, or edema.  Pulses are present.  NEUROLOGIC:  Intact.   EKG is completely normal.   ASSESSMENT AND PLAN:  Ms. Cazier is doing well.  I have asked her to take  an enteric-coated aspirin 81 mg at least every other day.  She will consider  this.  I have asked her to start the Actos that Dr. Everardo All prescribed.  We  made no other changes.  She will keep a close watch on her  blood pressure.  Otherwise, will see her back in a year.     Thomas C. Daleen Squibb, MD, [facility]  Electronically Signed    TCW/MedQ  DD: 09/11/2006  DT: 09/11/2006  Job #: [id number]

## 2011-04-18 ENCOUNTER — Ambulatory Visit
Admission: RE | Admit: 2011-04-18 | Discharge: 2011-04-18 | Disposition: A | Discharge status: Home / Self Care | Payer: No Typology Code available for payment source | Source: Ambulatory Visit | Attending: Gynecology | Admitting: Gynecology

## 2011-04-18 DIAGNOSIS — Z1231 Encounter for screening mammogram for malignant neoplasm of breast: Secondary | ICD-10-CM

## 2011-04-19 ENCOUNTER — Ambulatory Visit (INDEPENDENT_AMBULATORY_CARE_PROVIDER_SITE_OTHER): Payer: Medicare Other | Admitting: Internal Medicine

## 2011-04-19 ENCOUNTER — Encounter: Payer: Self-pay | Admitting: Internal Medicine

## 2011-04-19 DIAGNOSIS — R51 Headache: Secondary | ICD-10-CM

## 2011-04-19 DIAGNOSIS — J321 Chronic frontal sinusitis: Secondary | ICD-10-CM

## 2011-04-19 MED ORDER — FLUTICASONE PROPIONATE 50 MCG/ACT NA SUSP: 1.0000 | Freq: Every day | NASAL | Status: DC

## 2011-04-19 MED ORDER — AZITHROMYCIN 250 MG PO TABS: ORAL_TABLET | ORAL | Status: AC

## 2011-04-19 MED ORDER — OXYMETAZOLINE HCL 0.05 % NA SOLN: 2.0000 | Freq: Two times a day (BID) | NASAL | Status: AC

## 2011-04-19 NOTE — Patient Instructions (Signed)
It was good to see you today. Treat your sinus headache as discussed - Zpak antibiotics and nose sprays - Your prescription(s) have been submitted to your pharmacy. Please take as directed and contact our office if you believe you are having problem(s) with the medication(s). If symptoms worse or unimproved, call for further evaluation as needed

## 2011-04-19 NOTE — Progress Notes (Signed)
  Subjective:     [name] is a 77 y.o. female who presents for evaluation of sinus pain. Symptoms include: facial pain, headaches, nasal congestion, sinus pressure, sneezing, sore throat and tooth pain. Onset of symptoms was 3 days ago. History of same with prior sinus infections. Symptoms have been gradually worsening since onset - Not using any OTC medication for pain or allergy/sinus relief. Past history is significant for occasional episodes of bronchitis. Patient is a non-smoker. Patient denies history of recent trauma.  The following portions of the patient's history were reviewed and updated as appropriate: allergies, current medications, past medical history, past social history and problem list.  Review of Systems Constitutional: negative for chills and fevers Eyes: negative for visual disturbance Ears, nose, mouth, throat, and face: negative for earaches, facial trauma and tinnitus Respiratory: negative for cough and dyspnea on exertion Cardiovascular: negative for chest pain and near-syncope   Objective:    BP 130/72  Pulse 66  Temp(Src) 97.1 F (36.2 C) (Oral)  Ht 5\' 2"  (1.575 m)  Wt 124 lb 1.9 oz (56.3 kg)  BMI 22.70 kg/m2  SpO2 97% General appearance: alert, cooperative, appears stated age and no distress - dtr at side Head: Normocephalic, without obvious abnormality, atraumatic, sinuses tender to percussion over left frontal and maxillary region Eyes: conjunctivae/corneas clear. PERRL, EOM's intact. Fundi benign. Ears: normal TM's and external ear canals both ears Throat: lips, mucosa, and tongue normal; teeth and gums normal Lungs: clear to auscultation bilaterally Heart: regular rate and rhythm, S1, S2 normal, no murmur, click, rub or gallop  Neurologic: AAOx3, CN 2-12 symmetrically intact, gait/coordination normal, no dysarthria   Assessment:    Acute allergic sinusitis. possible infection component with facial/upper tooth pressure   Plan:    Nasal  saline sprays. Nasal steroids per medication orders. Antihistamines per medication orders. Azithromycin per medication orders.

## 2011-06-03 ENCOUNTER — Telehealth: Payer: Self-pay | Admitting: *Deleted

## 2011-06-03 NOTE — Telephone Encounter (Signed)
Message copied by Daphine Deutscher on Mon Jun 03, 2011  8:36 AM ------      Message from: Daphine Deutscher      Created: Thu Mar 07, 2011  9:35 AM       Patient due for CBC on 06/06/11. Call and remind pt.

## 2011-06-03 NOTE — Telephone Encounter (Signed)
Patient notified of lab appointment. 

## 2011-06-06 ENCOUNTER — Other Ambulatory Visit (INDEPENDENT_AMBULATORY_CARE_PROVIDER_SITE_OTHER): Payer: Medicare Other

## 2011-06-06 DIAGNOSIS — D649 Anemia, unspecified: Secondary | ICD-10-CM

## 2011-06-06 LAB — CBC WITH DIFFERENTIAL/PLATELET
Basophils Absolute: 0 10*3/uL (ref 0.0–0.1)
Basophils Relative: 0.4 % (ref 0.0–3.0)
Eosinophils Absolute: 0.2 10*3/uL (ref 0.0–0.7)
Eosinophils Relative: 4 % (ref 0.0–5.0)
HCT: 32.3 % — ABNORMAL LOW (ref 36.0–46.0)
Hemoglobin: 10.8 g/dL — ABNORMAL LOW (ref 12.0–15.0)
Lymphocytes Relative: 32.5 % (ref 12.0–46.0)
Lymphs Abs: 1.4 10*3/uL (ref 0.7–4.0)
MCHC: 33.3 g/dL (ref 30.0–36.0)
MCV: 92.8 fl (ref 78.0–100.0)
Monocytes Absolute: 0.6 10*3/uL (ref 0.1–1.0)
Monocytes Relative: 12.9 % — ABNORMAL HIGH (ref 3.0–12.0)
Neutro Abs: 2.2 10*3/uL (ref 1.4–7.7)
Neutrophils Relative %: 50.2 % (ref 43.0–77.0)
Platelets: 231 10*3/uL (ref 150.0–400.0)
RBC: 3.48 Mil/uL — ABNORMAL LOW (ref 3.87–5.11)
RDW: 13.7 % (ref 11.5–14.6)
WBC: 4.4 10*3/uL — ABNORMAL LOW (ref 4.5–10.5)

## 2011-06-10 ENCOUNTER — Telehealth: Payer: Self-pay

## 2011-06-10 ENCOUNTER — Other Ambulatory Visit: Payer: Self-pay | Admitting: Internal Medicine

## 2011-06-10 DIAGNOSIS — D649 Anemia, unspecified: Secondary | ICD-10-CM

## 2011-06-10 NOTE — Telephone Encounter (Signed)
Message copied by Michele Mcalpine on Mon Jun 10, 2011  3:24 PM ------      Message from: Lina Sar M      Created: Sun Jun 09, 2011 10:19 AM       Please call pt with stable CBC, repeat in 3 months

## 2011-06-10 NOTE — Telephone Encounter (Signed)
Pt aware.

## 2011-07-08 ENCOUNTER — Other Ambulatory Visit: Payer: Self-pay | Admitting: Endocrinology

## 2011-07-11 ENCOUNTER — Other Ambulatory Visit: Payer: Self-pay

## 2011-07-11 MED ORDER — SITAGLIPTIN PHOSPHATE 100 MG PO TABS: 100.0000 mg | ORAL_TABLET | Freq: Every day | ORAL | Status: DC

## 2011-07-11 NOTE — Telephone Encounter (Signed)
Daughter called requesting refill of Januvia

## 2011-07-20 ENCOUNTER — Other Ambulatory Visit: Payer: Self-pay | Admitting: Cardiology

## 2011-08-08 ENCOUNTER — Emergency Department (HOSPITAL_COMMUNITY): Payer: Medicare Other

## 2011-08-08 ENCOUNTER — Emergency Department (HOSPITAL_COMMUNITY)
Admission: EM | Admit: 2011-08-08 | Discharge: 2011-08-08 | Disposition: A | Discharge status: Home / Self Care | Payer: Medicare Other | Attending: Emergency Medicine | Admitting: Emergency Medicine

## 2011-08-08 DIAGNOSIS — M545 Low back pain, unspecified: Secondary | ICD-10-CM | POA: Insufficient documentation

## 2011-08-08 DIAGNOSIS — Z79899 Other long term (current) drug therapy: Secondary | ICD-10-CM | POA: Insufficient documentation

## 2011-08-08 DIAGNOSIS — Y93H2 Activity, gardening and landscaping: Secondary | ICD-10-CM | POA: Insufficient documentation

## 2011-08-08 DIAGNOSIS — N390 Urinary tract infection, site not specified: Secondary | ICD-10-CM | POA: Insufficient documentation

## 2011-08-08 DIAGNOSIS — I10 Essential (primary) hypertension: Secondary | ICD-10-CM | POA: Insufficient documentation

## 2011-08-08 DIAGNOSIS — IMO0001 Reserved for inherently not codable concepts without codable children: Secondary | ICD-10-CM | POA: Insufficient documentation

## 2011-08-08 DIAGNOSIS — I4891 Unspecified atrial fibrillation: Secondary | ICD-10-CM | POA: Insufficient documentation

## 2011-08-08 DIAGNOSIS — E7800 Pure hypercholesterolemia, unspecified: Secondary | ICD-10-CM | POA: Insufficient documentation

## 2011-08-08 DIAGNOSIS — Y92009 Unspecified place in unspecified non-institutional (private) residence as the place of occurrence of the external cause: Secondary | ICD-10-CM | POA: Insufficient documentation

## 2011-08-08 DIAGNOSIS — I2510 Atherosclerotic heart disease of native coronary artery without angina pectoris: Secondary | ICD-10-CM | POA: Insufficient documentation

## 2011-08-08 DIAGNOSIS — R300 Dysuria: Secondary | ICD-10-CM | POA: Insufficient documentation

## 2011-08-08 DIAGNOSIS — E119 Type 2 diabetes mellitus without complications: Secondary | ICD-10-CM | POA: Insufficient documentation

## 2011-08-08 DIAGNOSIS — T733XXA Exhaustion due to excessive exertion, initial encounter: Secondary | ICD-10-CM | POA: Insufficient documentation

## 2011-08-08 LAB — URINE MICROSCOPIC-ADD ON

## 2011-08-08 LAB — URINALYSIS, ROUTINE W REFLEX MICROSCOPIC
Bilirubin Urine: NEGATIVE
Glucose, UA: NEGATIVE mg/dL
Hgb urine dipstick: NEGATIVE
Ketones, ur: NEGATIVE mg/dL
Nitrite: NEGATIVE
Protein, ur: NEGATIVE mg/dL
Specific Gravity, Urine: 1.019 (ref 1.005–1.030)
Urobilinogen, UA: 1 mg/dL (ref 0.0–1.0)
pH: 5.5 (ref 5.0–8.0)

## 2011-08-16 ENCOUNTER — Ambulatory Visit: Payer: Medicare Other | Admitting: Cardiology

## 2011-08-16 ENCOUNTER — Other Ambulatory Visit: Payer: Self-pay | Admitting: Endocrinology

## 2011-08-19 ENCOUNTER — Other Ambulatory Visit: Payer: Self-pay | Admitting: Endocrinology

## 2011-08-20 ENCOUNTER — Encounter: Payer: Self-pay | Admitting: Endocrinology

## 2011-08-20 ENCOUNTER — Other Ambulatory Visit (INDEPENDENT_AMBULATORY_CARE_PROVIDER_SITE_OTHER): Payer: Medicare Other

## 2011-08-20 ENCOUNTER — Ambulatory Visit (INDEPENDENT_AMBULATORY_CARE_PROVIDER_SITE_OTHER): Payer: Medicare Other | Admitting: Endocrinology

## 2011-08-20 VITALS — BP 140/70 | HR 76 | Temp 98.1°F | Ht 62.0 in | Wt 128.8 lb

## 2011-08-20 DIAGNOSIS — Z23 Encounter for immunization: Secondary | ICD-10-CM

## 2011-08-20 DIAGNOSIS — E119 Type 2 diabetes mellitus without complications: Secondary | ICD-10-CM

## 2011-08-20 DIAGNOSIS — N390 Urinary tract infection, site not specified: Secondary | ICD-10-CM

## 2011-08-20 DIAGNOSIS — E871 Hypo-osmolality and hyponatremia: Secondary | ICD-10-CM

## 2011-08-20 DIAGNOSIS — D649 Anemia, unspecified: Secondary | ICD-10-CM

## 2011-08-20 LAB — URINALYSIS, ROUTINE W REFLEX MICROSCOPIC
Bilirubin Urine: NEGATIVE
Ketones, ur: NEGATIVE
Nitrite: NEGATIVE
Specific Gravity, Urine: 1.015 (ref 1.000–1.030)
Total Protein, Urine: NEGATIVE
Urine Glucose: NEGATIVE
Urobilinogen, UA: 0.2 (ref 0.0–1.0)
pH: 6 (ref 5.0–8.0)

## 2011-08-20 LAB — CBC WITH DIFFERENTIAL/PLATELET
Basophils Absolute: 0 10*3/uL (ref 0.0–0.1)
Basophils Relative: 0.6 % (ref 0.0–3.0)
Eosinophils Absolute: 0.3 10*3/uL (ref 0.0–0.7)
Eosinophils Relative: 6 % — ABNORMAL HIGH (ref 0.0–5.0)
HCT: 34.1 % — ABNORMAL LOW (ref 36.0–46.0)
Hemoglobin: 11.2 g/dL — ABNORMAL LOW (ref 12.0–15.0)
Lymphocytes Relative: 31.1 % (ref 12.0–46.0)
Lymphs Abs: 1.5 10*3/uL (ref 0.7–4.0)
MCHC: 32.8 g/dL (ref 30.0–36.0)
MCV: 92.8 fl (ref 78.0–100.0)
Monocytes Absolute: 0.6 10*3/uL (ref 0.1–1.0)
Monocytes Relative: 12.6 % — ABNORMAL HIGH (ref 3.0–12.0)
Neutro Abs: 2.3 10*3/uL (ref 1.4–7.7)
Neutrophils Relative %: 49.7 % (ref 43.0–77.0)
Platelets: 236 10*3/uL (ref 150.0–400.0)
RBC: 3.67 Mil/uL — ABNORMAL LOW (ref 3.87–5.11)
RDW: 13.6 % (ref 11.5–14.6)
WBC: 4.7 10*3/uL (ref 4.5–10.5)

## 2011-08-20 LAB — BASIC METABOLIC PANEL
BUN: 19 mg/dL (ref 6–23)
CO2: 27 mEq/L (ref 19–32)
Calcium: 9.6 mg/dL (ref 8.4–10.5)
Chloride: 100 mEq/L (ref 96–112)
Creatinine, Ser: 1.1 mg/dL (ref 0.4–1.2)
GFR: 60.75 mL/min (ref >60.00)
Glucose, Bld: 105 mg/dL — ABNORMAL HIGH (ref 70–99)
Potassium: 4.3 mEq/L (ref 3.5–5.1)
Sodium: 134 mEq/L — ABNORMAL LOW (ref 135–145)

## 2011-08-20 LAB — HEMOGLOBIN A1C: Hgb A1c MFr Bld: 7 % — ABNORMAL HIGH (ref 4.6–6.5)

## 2011-08-20 MED ORDER — TRAMADOL HCL 50 MG PO TABS: 50.0000 mg | ORAL_TABLET | Freq: Four times a day (QID) | ORAL | Status: AC | PRN

## 2011-08-20 NOTE — Progress Notes (Signed)
Subjective:    Patient ID: Armed forces operational officer, female    DOB: June 12, 1925, 77 y.o.   MRN: [id number]  HPI Pt stopped parlodel, "because it made me crazy."  pt has 2 weeks of low-back pain, but no assoc numbness. She was given abx for uti.   Past Medical History  Diagnosis Date  . Arrhythmia     paroxysmal atrial fibrillation  . Coronary artery disease   . Hypertension   . Hyperlipidemia   . Edema   . GERD (gastroesophageal reflux disease)   . Anemia B twelve deficiency   . Dizziness - giddy   . Abdominal pain   . Iron deficiency anemia, unspecified   . Pernicious anemia   . Thoracic or lumbosacral neuritis or radiculitis, unspecified   . Symptomatic menopausal or female climacteric states   . Lumbago   . Degeneration of lumbar or lumbosacral intervertebral disc   . Benign neoplasm of colon   . Diabetes mellitus without mention of complication   . Allergic rhinitis, cause unspecified     Past Surgical History  Procedure Date  . Abdominal hysterectomy 1974    and appendectomy  . Cholecystectomy   . Back surgery     History   Social History  . Marital Status: Widowed    Spouse Name: N/A    Number of Children: N/A  . Years of Education: N/A   Occupational History  . Not on file.   Social History Main Topics  . Smoking status: Never Smoker   . Smokeless tobacco: Not on file  . Alcohol Use: Not on file  . Drug Use: Not on file  . Sexually Active: Not on file   Other Topics Concern  . Not on file   Social History Narrative  . No narrative on file    Current Outpatient Prescriptions on File Prior to Visit  Medication Sig Dispense Refill  . aspirin 81 MG EC tablet Take 81 mg by mouth daily.        . fluticasone (FLONASE) 50 MCG/ACT nasal spray Place 1 spray into the nose daily.  16 g  2  . iron polysaccharides (NIFEREX) 150 MG capsule Take 150 mg by mouth. 1 tablet once daily       . lisinopril-hydrochlorothiazide (PRINZIDE,ZESTORETIC) 20-12.5 MG per tablet  TAKE 1 TABLET BY MOUTH ONCE DAILY  30 tablet  5  . loratadine-pseudoephedrine (CLARITIN-D 24-HOUR) 10-240 MG per 24 hr tablet Take 1 tablet by mouth. 1 daily as needed for congestion       . metFORMIN (GLUCOPHAGE-XR) 500 MG 24 hr tablet TAKE ONE TABLET BY MOUTH TWICE DAILY  60 tablet  5  . nitroGLYCERIN (NITROSTAT) 0.4 MG SL tablet Place 0.4 mg under the tongue every 5 (five) minutes as needed.        . Omeprazole ([employer] OMEPRAZOLE) 20 MG TBEC Take 1 tablet (20 mg total) by mouth daily.  30 each  11  . pravastatin (PRAVACHOL) 40 MG tablet TAKE TWO TABLETS BY MOUTH EVERY DAY AT BEDTIME  60 tablet  5  . sitaGLIPtin (JANUVIA) 100 MG tablet Take 1 tablet (100 mg total) by mouth daily.  90 tablet  1  . TOPROL XL 50 MG 24 hr tablet TAKE 1 TABLET EVERY DAY  30 tablet  10    Allergies  Allergen Reactions  . Celecoxib     REACTION: rash  . Erythromycin     REACTION: itching  . Nitrofurantoin     REACTION: rash  . Penicillins  REACTION: swelling of joints  . Piroxicam   . Povidone-Iodine     REACTION: itching  . Sulfonamide Derivatives     REACTION: "like my head was full of water"   No family history on file.  BP 140/70  Pulse 76  Temp(Src) 98.1 F (36.7 C) (Oral)  Ht 5\' 2"  (1.575 m)  Wt 128 lb 12.8 oz (58.423 kg)  BMI 23.56 kg/m2  SpO2 97%  Review of Systems denies hypoglycemia, fever, and weight change.    Objective:   Physical Exam VITAL SIGNS:  See vs page GENERAL: no distress. Pulses: dorsalis pedis intact bilat.   Feet: no deformity.  no ulcer on the feet.  feet are of normal color and temp.  no edema.  There is bilateral onychomycosis, and varicosities. Neuro: sensation is intact to touch on the feet  Lab Results  Component Value Date   HGBA1C 7.0* 08/20/2011  ua is neg     Assessment & Plan:  Dm, well-controlled, off the parlodel Uti, resolved Low-back pain, pain needs increased rx. Htn, poss exac by pain

## 2011-08-20 NOTE — Patient Instructions (Addendum)
blood and urine tests are being ordered for you today.  please call [phone] to hear your test results.  You will be prompted to enter the 9-digit "MRN" number that appears at the top left of this page, followed by #.  Then you will hear the message. Please come back for a follow-up appointment in 3 months. i have sent a prescription to your pharmacy, to refill the "tramadol." (update: i left message on phone-tree:  rx as we discussed)

## 2011-08-21 ENCOUNTER — Telehealth: Payer: Self-pay | Admitting: *Deleted

## 2011-08-21 NOTE — Telephone Encounter (Signed)
Patient notified of results as per Dr. Brodie. 

## 2011-08-21 NOTE — Telephone Encounter (Signed)
Message copied by Daphine Deutscher on Wed Aug 21, 2011 11:47 AM ------      Message from: Hart Carwin      Created: Wed Aug 21, 2011 11:24 AM       PLEASE CALL PT WITH STABLE, SLIGHTLY LOW CBC

## 2011-09-02 ENCOUNTER — Ambulatory Visit (INDEPENDENT_AMBULATORY_CARE_PROVIDER_SITE_OTHER): Payer: Medicare Other | Admitting: Cardiology

## 2011-09-02 ENCOUNTER — Encounter: Payer: Self-pay | Admitting: Cardiology

## 2011-09-02 DIAGNOSIS — I4891 Unspecified atrial fibrillation: Secondary | ICD-10-CM

## 2011-09-02 DIAGNOSIS — R002 Palpitations: Secondary | ICD-10-CM

## 2011-09-02 DIAGNOSIS — I10 Essential (primary) hypertension: Secondary | ICD-10-CM

## 2011-09-02 DIAGNOSIS — I2510 Atherosclerotic heart disease of native coronary artery without angina pectoris: Secondary | ICD-10-CM

## 2011-09-02 NOTE — Progress Notes (Signed)
HPI [name] comes in today for the evaluation of her coronary disease, paroxysmal A. Fib, and history of hypertension. Her laboratory data is followed Dr. Everardo All.  She's having no angina or ischemic symptoms. Denies any palpitations, presyncope or syncope. She fell recently in the yard. Her daughters are very worried about her falling. I reinforced that began today.  Medications reviewed and she is compliant.  Recent laboratory data also reviewed. Past Medical History  Diagnosis Date  . Arrhythmia     paroxysmal atrial fibrillation  . Coronary artery disease   . Hypertension   . Hyperlipidemia   . Edema   . GERD (gastroesophageal reflux disease)   . Anemia B twelve deficiency   . Dizziness - giddy   . Abdominal pain   . Iron deficiency anemia, unspecified   . Pernicious anemia   . Thoracic or lumbosacral neuritis or radiculitis, unspecified   . Symptomatic menopausal or female climacteric states   . Lumbago   . Degeneration of lumbar or lumbosacral intervertebral disc   . Benign neoplasm of colon   . Diabetes mellitus without mention of complication   . Allergic rhinitis, cause unspecified     Past Surgical History  Procedure Date  . Abdominal hysterectomy 1974    and appendectomy  . Cholecystectomy   . Back surgery     No family history on file.  History   Social History  . Marital Status: Widowed    Spouse Name: N/A    Number of Children: N/A  . Years of Education: N/A   Occupational History  . Not on file.   Social History Main Topics  . Smoking status: Never Smoker   . Smokeless tobacco: Not on file  . Alcohol Use: Not on file  . Drug Use: Not on file  . Sexually Active: Not on file   Other Topics Concern  . Not on file   Social History Narrative  . No narrative on file    Allergies  Allergen Reactions  . Celecoxib     REACTION: rash  . Erythromycin     REACTION: itching  . Nitrofurantoin     REACTION: rash  . Penicillins    REACTION: swelling of joints  . Piroxicam   . Povidone-Iodine     REACTION: itching  . Sulfonamide Derivatives     REACTION: "like my head was full of water"    Current Outpatient Prescriptions  Medication Sig Dispense Refill  . aspirin 81 MG EC tablet Take 81 mg by mouth daily.        . iron polysaccharides (NIFEREX) 150 MG capsule Take 150 mg by mouth. 1 tablet once daily       . lisinopril-hydrochlorothiazide (PRINZIDE,ZESTORETIC) 20-12.5 MG per tablet TAKE 1 TABLET BY MOUTH ONCE DAILY  30 tablet  5  . loratadine-pseudoephedrine (CLARITIN-D 24-HOUR) 10-240 MG per 24 hr tablet Take 1 tablet by mouth. 1 daily as needed for congestion       . metFORMIN (GLUCOPHAGE-XR) 500 MG 24 hr tablet TAKE ONE TABLET BY MOUTH TWICE DAILY  60 tablet  5  . metoprolol (TOPROL-XL) 50 MG 24 hr tablet Take 50 mg by mouth daily.       . nitroGLYCERIN (NITROSTAT) 0.4 MG SL tablet Place 0.4 mg under the tongue every 5 (five) minutes as needed.        . Omeprazole ([facility] OMEPRAZOLE) 20 MG TBEC Take 1 tablet (20 mg total) by mouth daily.  30 each  11  .  pravastatin (PRAVACHOL) 40 MG tablet TAKE TWO TABLETS BY MOUTH EVERY DAY AT BEDTIME  60 tablet  5  . sitaGLIPtin (JANUVIA) 100 MG tablet Take 1 tablet (100 mg total) by mouth daily.  90 tablet  1  . traMADol (ULTRAM) 50 MG tablet Take 50 mg by mouth every 8 (eight) hours as needed.         ROS Negative other than HPI.   PE General Appearance: well developed, well nourished in no acute distress, frail HEENT: symmetrical face, PERRLA, good dentition  Neck: no JVD, thyromegaly, or adenopathy, trachea midline Chest: symmetric without deformity Cardiac: PMI non-displaced, RRR, normal S1, S2, no gallop or murmur Lung: clear to ausculation and percussion Vascular: all pulses full without bruits  Abdominal: nondistended, nontender, good bowel sounds, no HSM, no bruits Extremities: no cyanosis, clubbing or edema, no sign of DVT, no varicosities  Skin: normal color,  no rashes Neuro: alert and oriented x 3, non-focal Pysch: normal affect Filed Vitals:   09/02/11 1530  BP: 148/70  Pulse: 72  Height: 5' (1.524 m)  Weight: 124 lb (56.246 kg)    EKG  Labs and Studies Reviewed.   Lab Results  Component Value Date   WBC 4.7 08/20/2011   HGB 11.2* 08/20/2011   HCT 34.1* 08/20/2011   MCV 92.8 08/20/2011   PLT 236.0 08/20/2011      Chemistry      Component Value Date/Time   NA 134* 08/20/2011 0957   K 4.3 08/20/2011 0957   CL 100 08/20/2011 0957   CO2 27 08/20/2011 0957   BUN 19 08/20/2011 0957   CREATININE 1.1 08/20/2011 0957      Component Value Date/Time   CALCIUM 9.6 08/20/2011 0957   ALKPHOS 63 10/09/2010 1159   AST 23 10/09/2010 1159   ALT 14 10/09/2010 1159   BILITOT 0.4 10/09/2010 1159       Lab Results  Component Value Date   CHOL 156 10/09/2010   CHOL  Value: 135        ATP III CLASSIFICATION:  <200     mg/dL   Desirable  829-562  mg/dL   Borderline High  >=130    mg/dL   High        8/65/7846   CHOL 138 11/10/2008   Lab Results  Component Value Date   HDL 61.80 10/09/2010   HDL 70 02/01/2010   HDL 61.5 11/10/2008   Lab Results  Component Value Date   LDLCALC 66 10/09/2010   LDLCALC  Value: 54        Total Cholesterol/HDL:CHD Risk Coronary Heart Disease Risk Table                     Men   Women  1/2 Average Risk   3.4   3.3  Average Risk       5.0   4.4  2 X Average Risk   9.6   7.1  3 X Average Risk  23.4   11.0        Use the calculated Patient Ratio above and the CHD Risk Table to determine the patient's CHD Risk.        ATP III CLASSIFICATION (LDL):  <100     mg/dL   Optimal  962-952  mg/dL   Near or Above                    Optimal  130-159  mg/dL   Borderline  160-189  mg/dL   High  >161     mg/dL   Very High 0/96/0454   LDLCALC 63 11/10/2008   Lab Results  Component Value Date   TRIG 143.0 10/09/2010   TRIG 57 02/01/2010   TRIG 69 11/10/2008   Lab Results  Component Value Date   CHOLHDL 3 10/09/2010   CHOLHDL 1.9  02/01/2010   CHOLHDL 2.2 CALC 11/10/2008   Lab Results  Component Value Date   HGBA1C 7.0* 08/20/2011   Lab Results  Component Value Date   ALT 14 10/09/2010   AST 23 10/09/2010   ALKPHOS 63 10/09/2010   BILITOT 0.4 10/09/2010   Lab Results  Component Value Date   TSH 2.27 10/09/2010

## 2011-09-02 NOTE — Patient Instructions (Signed)
Your physician wants you to follow-up in: 1 year with Dr. Wall. You will receive a reminder letter in the mail two months in advance. If you don't receive a letter, please call our office to schedule the follow-up appointment.  

## 2011-09-02 NOTE — Assessment & Plan Note (Signed)
Improved. No change in medication.

## 2011-09-02 NOTE — Assessment & Plan Note (Signed)
Stable.  Continue current medications.

## 2011-09-02 NOTE — Assessment & Plan Note (Signed)
Improved

## 2011-11-20 ENCOUNTER — Other Ambulatory Visit (INDEPENDENT_AMBULATORY_CARE_PROVIDER_SITE_OTHER): Payer: Medicare Other

## 2011-11-20 ENCOUNTER — Encounter: Payer: Self-pay | Admitting: Endocrinology

## 2011-11-20 ENCOUNTER — Ambulatory Visit (INDEPENDENT_AMBULATORY_CARE_PROVIDER_SITE_OTHER): Payer: Medicare Other | Admitting: Endocrinology

## 2011-11-20 ENCOUNTER — Ambulatory Visit (INDEPENDENT_AMBULATORY_CARE_PROVIDER_SITE_OTHER)
Admission: RE | Admit: 2011-11-20 | Discharge: 2011-11-20 | Disposition: A | Discharge status: Home / Self Care | Payer: Medicare Other | Source: Ambulatory Visit | Attending: Endocrinology | Admitting: Endocrinology

## 2011-11-20 DIAGNOSIS — Z79899 Other long term (current) drug therapy: Secondary | ICD-10-CM

## 2011-11-20 DIAGNOSIS — I10 Essential (primary) hypertension: Secondary | ICD-10-CM | POA: Diagnosis not present

## 2011-11-20 DIAGNOSIS — M545 Low back pain, unspecified: Secondary | ICD-10-CM | POA: Diagnosis not present

## 2011-11-20 DIAGNOSIS — E119 Type 2 diabetes mellitus without complications: Secondary | ICD-10-CM

## 2011-11-20 DIAGNOSIS — M5137 Other intervertebral disc degeneration, lumbosacral region: Secondary | ICD-10-CM | POA: Diagnosis not present

## 2011-11-20 DIAGNOSIS — D510 Vitamin B12 deficiency anemia due to intrinsic factor deficiency: Secondary | ICD-10-CM | POA: Diagnosis not present

## 2011-11-20 DIAGNOSIS — Z0000 Encounter for general adult medical examination without abnormal findings: Secondary | ICD-10-CM

## 2011-11-20 DIAGNOSIS — E871 Hypo-osmolality and hyponatremia: Secondary | ICD-10-CM

## 2011-11-20 DIAGNOSIS — N951 Menopausal and female climacteric states: Secondary | ICD-10-CM | POA: Insufficient documentation

## 2011-11-20 LAB — CBC WITH DIFFERENTIAL/PLATELET
Basophils Absolute: 0 10*3/uL (ref 0.0–0.1)
Basophils Relative: 0.5 % (ref 0.0–3.0)
Eosinophils Absolute: 0.2 10*3/uL (ref 0.0–0.7)
Eosinophils Relative: 4.5 % (ref 0.0–5.0)
HCT: 34.6 % — ABNORMAL LOW (ref 36.0–46.0)
Hemoglobin: 11.4 g/dL — ABNORMAL LOW (ref 12.0–15.0)
Lymphocytes Relative: 32.3 % (ref 12.0–46.0)
Lymphs Abs: 1.6 10*3/uL (ref 0.7–4.0)
MCHC: 33.1 g/dL (ref 30.0–36.0)
MCV: 90.9 fl (ref 78.0–100.0)
Monocytes Absolute: 0.6 10*3/uL (ref 0.1–1.0)
Monocytes Relative: 12.9 % — ABNORMAL HIGH (ref 3.0–12.0)
Neutro Abs: 2.4 10*3/uL (ref 1.4–7.7)
Neutrophils Relative %: 49.8 % (ref 43.0–77.0)
Platelets: 233 10*3/uL (ref 150.0–400.0)
RBC: 3.8 Mil/uL — ABNORMAL LOW (ref 3.87–5.11)
RDW: 14 % (ref 11.5–14.6)
WBC: 4.9 10*3/uL (ref 4.5–10.5)

## 2011-11-20 LAB — BASIC METABOLIC PANEL
BUN: 18 mg/dL (ref 6–23)
CO2: 25 mEq/L (ref 19–32)
Calcium: 9.6 mg/dL (ref 8.4–10.5)
Chloride: 105 mEq/L (ref 96–112)
Creatinine, Ser: 1 mg/dL (ref 0.4–1.2)
GFR: 67.77 mL/min (ref >60.00)
Glucose, Bld: 129 mg/dL — ABNORMAL HIGH (ref 70–99)
Potassium: 3.8 mEq/L (ref 3.5–5.1)
Sodium: 138 mEq/L (ref 135–145)

## 2011-11-20 LAB — MICROALBUMIN / CREATININE URINE RATIO
Creatinine,U: 100.4 mg/dL
Microalb Creat Ratio: 1.1 mg/g (ref 0.0–30.0)
Microalb, Ur: 1.1 mg/dL (ref 0.0–1.9)

## 2011-11-20 LAB — URINALYSIS, ROUTINE W REFLEX MICROSCOPIC
Bilirubin Urine: NEGATIVE
Ketones, ur: NEGATIVE
Nitrite: NEGATIVE
Specific Gravity, Urine: 1.02 (ref 1.000–1.030)
Total Protein, Urine: NEGATIVE
Urine Glucose: NEGATIVE
Urobilinogen, UA: 0.2 (ref 0.0–1.0)
pH: 6 (ref 5.0–8.0)

## 2011-11-20 LAB — HEPATIC FUNCTION PANEL
ALT: 17 U/L (ref 0–35)
AST: 25 U/L (ref 0–37)
Albumin: 4.3 g/dL (ref 3.5–5.2)
Alkaline Phosphatase: 63 U/L (ref 39–117)
Bilirubin, Direct: 0.1 mg/dL (ref 0.0–0.3)
Total Bilirubin: 0.9 mg/dL (ref 0.3–1.2)
Total Protein: 7.4 g/dL (ref 6.0–8.3)

## 2011-11-20 LAB — LIPID PANEL
Cholesterol: 172 mg/dL (ref 0–200)
HDL: 68.6 mg/dL (ref >39.00)
LDL Cholesterol: 89 mg/dL (ref 0–99)
Total CHOL/HDL Ratio: 3
Triglycerides: 73 mg/dL (ref 0.0–149.0)
VLDL: 14.6 mg/dL (ref 0.0–40.0)

## 2011-11-20 LAB — IBC PANEL
Iron: 65 ug/dL (ref 42–145)
Saturation Ratios: 20.5 % (ref 20.0–50.0)
Transferrin: 226.7 mg/dL (ref 212.0–360.0)

## 2011-11-20 LAB — TSH: TSH: 2.75 u[IU]/mL (ref 0.35–5.50)

## 2011-11-20 LAB — HEMOGLOBIN A1C: Hgb A1c MFr Bld: 6.8 % — ABNORMAL HIGH (ref 4.6–6.5)

## 2011-11-20 MED ORDER — LIDOCAINE 5 % EX PTCH: 1.0000 | MEDICATED_PATCH | Freq: Every day | CUTANEOUS | Status: AC

## 2011-11-20 NOTE — Progress Notes (Signed)
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   MRN: [id number]  HPI Pt states few mos of slight pain at the lower back, but no assoc numbness.  She had surgery there in the 1980's.   Past Medical History  Diagnosis Date  . Arrhythmia     paroxysmal atrial fibrillation  . Coronary artery disease   . Hypertension   . Hyperlipidemia   . Edema   . GERD (gastroesophageal reflux disease)   . Anemia B twelve deficiency   . Dizziness - giddy   . Abdominal pain   . Iron deficiency anemia, unspecified   . Pernicious anemia   . Thoracic or lumbosacral neuritis or radiculitis, unspecified   . Symptomatic menopausal or female climacteric states   . Lumbago   . Degeneration of lumbar or lumbosacral intervertebral disc   . Benign neoplasm of colon   . Diabetes mellitus without mention of complication   . Allergic rhinitis, cause unspecified     Past Surgical History  Procedure Date  . Abdominal hysterectomy 1974    and appendectomy  . Cholecystectomy   . Back surgery     History   Social History  . Marital Status: Widowed    Spouse Name: N/A    Number of Children: N/A  . Years of Education: N/A   Occupational History  . Not on file.   Social History Main Topics  . Smoking status: Never Smoker   . Smokeless tobacco: Not on file  . Alcohol Use: Not on file  . Drug Use: Not on file  . Sexually Active: Not on file   Other Topics Concern  . Not on file   Social History Narrative  . No narrative on file    Current Outpatient Prescriptions on File Prior to Visit  Medication Sig Dispense Refill  . aspirin 81 MG EC tablet Take 81 mg by mouth daily.        . iron polysaccharides (NIFEREX) 150 MG capsule Take 150 mg by mouth 2 (two) times daily.       [name] lisinopril-hydrochlorothiazide (PRINZIDE,ZESTORETIC) 20-12.5 MG per tablet TAKE 1 TABLET BY MOUTH ONCE DAILY  30 tablet  5  . loratadine-pseudoephedrine (CLARITIN-D 24-HOUR) 10-240 MG per 24  hr tablet Take 1 tablet by mouth. 1 daily as needed for congestion       . metFORMIN (GLUCOPHAGE-XR) 500 MG 24 hr tablet TAKE ONE TABLET BY MOUTH TWICE DAILY  60 tablet  5  . metoprolol (TOPROL-XL) 50 MG 24 hr tablet Take 50 mg by mouth daily.       . nitroGLYCERIN (NITROSTAT) 0.4 MG SL tablet Place 0.4 mg under the tongue every 5 (five) minutes as needed.        . Omeprazole ([facility] OMEPRAZOLE) 20 MG TBEC Take 1 tablet (20 mg total) by mouth daily.  30 each  11  . pravastatin (PRAVACHOL) 40 MG tablet TAKE TWO TABLETS BY MOUTH EVERY DAY AT BEDTIME  60 tablet  5  . sitaGLIPtin (JANUVIA) 100 MG tablet Take 1 tablet (100 mg total) by mouth daily.  90 tablet  1  . traMADol (ULTRAM) 50 MG tablet Take 50 mg by mouth every 8 (eight) hours as needed.         Allergies  Allergen Reactions  . Celecoxib     REACTION: rash  . Erythromycin     REACTION: itching  . Nitrofurantoin     REACTION: rash  .  Penicillins     REACTION: swelling of joints  . Piroxicam   . Povidone-Iodine     REACTION: itching  . Sulfonamide Derivatives     REACTION: "like my head was full of water"    No family history on file.  BP 140/80  Pulse 74  Temp(Src) 97.4 F (36.3 C) (Oral)  Ht 5\' 1"  (1.549 m)  Wt 123 lb 6 oz (55.963 kg)  BMI 23.31 kg/m2  SpO2 96%   Review of Systems  Constitutional: Negative for unexpected weight change.  Respiratory: Negative for shortness of breath.   Cardiovascular: Negative for chest pain.  Gastrointestinal: Negative for anal bleeding.  Genitourinary: Negative for hematuria.  Neurological: Negative for syncope.      Objective:   Physical Exam VITAL SIGNS:  See vs page GENERAL: no distress Spine:  Nontender.  There is a healed midline lumbar scar.   Pulses: dorsalis pedis intact bilat.   Feet: no deformity.  no ulcer on the feet.  feet are of normal color and temp.  no edema.  There is bilateral onychomycosis, and varicosities. Neuro: sensation is intact to touch on the  feet   Lab Results  Component Value Date   WBC 4.9 11/20/2011   HGB 11.4* 11/20/2011   HCT 34.6* 11/20/2011   PLT 233.0 11/20/2011   GLUCOSE 129* 11/20/2011   CHOL 172 11/20/2011   TRIG 73.0 11/20/2011   HDL 68.60 11/20/2011   LDLDIRECT 155.3 08/11/2008   LDLCALC 89 11/20/2011   ALT 17 11/20/2011   AST 25 11/20/2011   NA 138 11/20/2011   K 3.8 11/20/2011   CL 105 11/20/2011   CREATININE 1.0 11/20/2011   BUN 18 11/20/2011   CO2 25 11/20/2011   TSH 2.75 11/20/2011   INR 1.14 02/18/2010   HGBA1C 6.8* 11/20/2011   MICROALBUR 1.1 11/20/2011      Assessment & Plan:  Low-back pain, recurrent fe-deficiency anemia, needs increased rx DM, well-controlled Dyslipidemia, needs increased rx    Subjective:   Patient here for Medicare annual wellness visit and management of other chronic and acute problems.     Risk factors: advanced age    Roster of Physicians Providing Medical Care to Patient: Opthal: hecker Cardiol: wall   Activities of Daily Living: In your present state of health, do you have any difficulty performing the following activities?:  Preparing food and eating?: No  Bathing yourself: No  Getting dressed: No  Using the toilet: No  Moving around from place to place: No  In the past year have you fallen or had a near fall?: No    Home Safety: Has smoke detector and wears seat belts. Firearms are safely stored.   Diet and Exercise  Current exercise habits:  Pt says very good Dietary issues discussed: pt reports a healthy diet   Depression Screen  Q1: Over the past two weeks, have you felt down, depressed or hopeless? no  Q2: Over the past two weeks, have you felt little interest or pleasure in doing things? no   The following portions of the patient's history were reviewed and updated as appropriate: allergies, current medications, past family history, past medical history, past social history, past surgical history and problem list.  Past Medical History  Diagnosis Date  . Arrhythmia      paroxysmal atrial fibrillation  . Coronary artery disease   . Hypertension   . Hyperlipidemia   . Edema   . GERD (gastroesophageal reflux disease)   . Anemia B  twelve deficiency   . Dizziness - giddy   . Abdominal pain   . Iron deficiency anemia, unspecified   . Pernicious anemia   . Thoracic or lumbosacral neuritis or radiculitis, unspecified   . Symptomatic menopausal or female climacteric states   . Lumbago   . Degeneration of lumbar or lumbosacral intervertebral disc   . Benign neoplasm of colon   . Diabetes mellitus without mention of complication   . Allergic rhinitis, cause unspecified     Past Surgical History  Procedure Date  . Abdominal hysterectomy 1974    and appendectomy  . Cholecystectomy   . Back surgery     History   Social History  . Marital Status: Widowed    Spouse Name: N/A    Number of Children: N/A  . Years of Education: N/A   Occupational History  . Not on file.   Social History Main Topics  . Smoking status: Never Smoker   . Smokeless tobacco: Not on file  . Alcohol Use: Not on file  . Drug Use: Not on file  . Sexually Active: Not on file   Other Topics Concern  . Not on file   Social History Narrative  . No narrative on file    Current Outpatient Prescriptions on File Prior to Visit  Medication Sig Dispense Refill  . aspirin 81 MG EC tablet Take 81 mg by mouth daily.        . iron polysaccharides (NIFEREX) 150 MG capsule Take 150 mg by mouth. 1 tablet once daily       . lisinopril-hydrochlorothiazide (PRINZIDE,ZESTORETIC) 20-12.5 MG per tablet TAKE 1 TABLET BY MOUTH ONCE DAILY  30 tablet  5  . loratadine-pseudoephedrine (CLARITIN-D 24-HOUR) 10-240 MG per 24 hr tablet Take 1 tablet by mouth. 1 daily as needed for congestion       . metFORMIN (GLUCOPHAGE-XR) 500 MG 24 hr tablet TAKE ONE TABLET BY MOUTH TWICE DAILY  60 tablet  5  . metoprolol (TOPROL-XL) 50 MG 24 hr tablet Take 50 mg by mouth daily.       . nitroGLYCERIN (NITROSTAT)  0.4 MG SL tablet Place 0.4 mg under the tongue every 5 (five) minutes as needed.        . Omeprazole ([facility] OMEPRAZOLE) 20 MG TBEC Take 1 tablet (20 mg total) by mouth daily.  30 each  11  . pravastatin (PRAVACHOL) 40 MG tablet TAKE TWO TABLETS BY MOUTH EVERY DAY AT BEDTIME  60 tablet  5  . sitaGLIPtin (JANUVIA) 100 MG tablet Take 1 tablet (100 mg total) by mouth daily.  90 tablet  1  . traMADol (ULTRAM) 50 MG tablet Take 50 mg by mouth every 8 (eight) hours as needed.         Allergies  Allergen Reactions  . Celecoxib     REACTION: rash  . Erythromycin     REACTION: itching  . Nitrofurantoin     REACTION: rash  . Penicillins     REACTION: swelling of joints  . Piroxicam   . Povidone-Iodine     REACTION: itching  . Sulfonamide Derivatives     REACTION: "like my head was full of water"    No family history on file.  BP 140/80  Pulse 74  Temp(Src) 97.4 F (36.3 C) (Oral)  Ht 5\' 1"  (1.549 m)  Wt 123 lb 6 oz (55.963 kg)  BMI 23.31 kg/m2  SpO2 96%   Review of Systems  Denies hearing loss, and visual loss  Objective:   Vision:  Sees opthalmologist Hearing: grossly normal Body mass index:  See vs page Msk: pt easily and quickly performs "get-up-and-go" from a sitting position Cognitive Impairment Assessment: cognition, memory and judgment appear normal.  remembers 1/3 at 5 minutes (? effort).  excellent recall.  can easily read and write a sentence.  alert and oriented x 3   Assessment:   Medicare wellness utd on preventive parameters    Plan:   During the course of the visit the patient was educated and counseled about appropriate screening and preventive services including:        Fall prevention   Screening mammography  Bone densitometry screening  Diabetes screening  Nutrition counseling   Vaccines / LABS Zostavax is advised  Patient Instructions (the written plan) was given to the patient.

## 2011-11-20 NOTE — Patient Instructions (Addendum)
blood and urine tests, and x-rays, are being ordered for you today.  please call [phone] to hear your test results.  You will be prompted to enter the 9-digit "MRN" number that appears at the top left of this page, followed by #.  Then you will hear the message. Please come back for a follow-up appointment in 4 months, with a bone-density test the same day.  . i have sent a prescription to your pharmacy, for a pain patch.  You should have a vaccine against shingles (a painful rash which results from the  chickenpox infection which most people had many years ago).  This vaccine reduces, but does not totally eliminate the risk of shingles.  Because this is a medicare part d benefit, you should go to a pharmacy and get the injection. please consider these measures for your health:  minimize alcohol.  do not use tobacco products.  have a colonoscopy at least every 10 years from age 70.  Women should have an annual mammogram from age 34.  keep firearms safely stored.  always use seat belts.  have working smoke alarms in your home.  see an eye doctor and dentist regularly.  never drive under the influence of alcohol or drugs (including prescription drugs).   please let me know what your wishes would be, if artificial life support measures should become necessary.  it is critically important to prevent falling down (keep floor areas well-lit, dry, and free of loose objects.  If you have a cane, walker, or wheelchair, you should use it, even for short trips around the house.  Also, try not to rush).   (update:  we discussed code status.  pt requests full code, but would not want to be started or maintained on artificial life-support measures if there was not a reasonable chance of recovery) (update: i left message on phone-tree:  Increase fe to 1-bid.  Be certain you are on pravachol)

## 2011-11-27 ENCOUNTER — Ambulatory Visit
Admission: RE | Admit: 2011-11-27 | Discharge: 2011-11-27 | Disposition: A | Discharge status: Home / Self Care | Payer: Medicare Other | Source: Ambulatory Visit

## 2011-11-27 DIAGNOSIS — N951 Menopausal and female climacteric states: Secondary | ICD-10-CM

## 2011-12-15 ENCOUNTER — Other Ambulatory Visit: Payer: Self-pay | Admitting: Endocrinology

## 2011-12-21 DIAGNOSIS — R079 Chest pain, unspecified: Secondary | ICD-10-CM | POA: Diagnosis not present

## 2012-01-13 DIAGNOSIS — E119 Type 2 diabetes mellitus without complications: Secondary | ICD-10-CM | POA: Diagnosis not present

## 2012-01-13 DIAGNOSIS — H538 Other visual disturbances: Secondary | ICD-10-CM | POA: Diagnosis not present

## 2012-01-13 DIAGNOSIS — Z961 Presence of intraocular lens: Secondary | ICD-10-CM | POA: Diagnosis not present

## 2012-01-13 DIAGNOSIS — H04129 Dry eye syndrome of unspecified lacrimal gland: Secondary | ICD-10-CM | POA: Diagnosis not present

## 2012-01-13 DIAGNOSIS — H43819 Vitreous degeneration, unspecified eye: Secondary | ICD-10-CM | POA: Diagnosis not present

## 2012-01-13 DIAGNOSIS — H40029 Open angle with borderline findings, high risk, unspecified eye: Secondary | ICD-10-CM | POA: Diagnosis not present

## 2012-01-17 ENCOUNTER — Other Ambulatory Visit: Payer: Self-pay | Admitting: Endocrinology

## 2012-01-20 ENCOUNTER — Telehealth: Payer: Self-pay | Admitting: *Deleted

## 2012-01-20 NOTE — Telephone Encounter (Signed)
Eve if you just take it 3x a week, that would probably be ok: 1.  Your anemia is mild 2.  The alternative is intravenous iron, which is a nuisance, and has side-effects

## 2012-01-20 NOTE — Telephone Encounter (Signed)
Daughter says her mother's medication is making her sick.  She was taking Nu Iron but that is no longer available through the pharmacy.  Ferrex 150 mg. was substituted.  Directions state that it is to be taken 1 hour before a meal or 2-3 hours after a meal.  This medication is making her sick to her stomach when she takes it.   Daughter says she is certain Dr. Juanda Chance wants her to take Iron.  Please call before 10:30 am tomorrow.

## 2012-01-21 NOTE — Telephone Encounter (Signed)
Left message for pt to callback office.  

## 2012-01-21 NOTE — Telephone Encounter (Signed)
Pt's daughter informed of MD's advisement.  

## 2012-01-23 ENCOUNTER — Other Ambulatory Visit: Payer: Self-pay | Admitting: Endocrinology

## 2012-02-12 ENCOUNTER — Other Ambulatory Visit: Payer: Self-pay | Admitting: Endocrinology

## 2012-03-05 ENCOUNTER — Other Ambulatory Visit: Payer: Self-pay | Admitting: Gynecology

## 2012-03-05 DIAGNOSIS — Z1231 Encounter for screening mammogram for malignant neoplasm of breast: Secondary | ICD-10-CM

## 2012-03-09 ENCOUNTER — Ambulatory Visit (INDEPENDENT_AMBULATORY_CARE_PROVIDER_SITE_OTHER)
Admission: RE | Admit: 2012-03-09 | Discharge: 2012-03-09 | Disposition: A | Discharge status: Home / Self Care | Payer: Medicare Other | Source: Ambulatory Visit | Attending: Endocrinology | Admitting: Endocrinology

## 2012-03-09 DIAGNOSIS — N951 Menopausal and female climacteric states: Secondary | ICD-10-CM

## 2012-03-16 DIAGNOSIS — H02839 Dermatochalasis of unspecified eye, unspecified eyelid: Secondary | ICD-10-CM | POA: Diagnosis not present

## 2012-03-16 DIAGNOSIS — H40019 Open angle with borderline findings, low risk, unspecified eye: Secondary | ICD-10-CM | POA: Diagnosis not present

## 2012-03-16 DIAGNOSIS — H04129 Dry eye syndrome of unspecified lacrimal gland: Secondary | ICD-10-CM | POA: Diagnosis not present

## 2012-03-19 ENCOUNTER — Other Ambulatory Visit (INDEPENDENT_AMBULATORY_CARE_PROVIDER_SITE_OTHER): Payer: Medicare Other

## 2012-03-19 ENCOUNTER — Other Ambulatory Visit: Payer: Self-pay | Admitting: Endocrinology

## 2012-03-19 ENCOUNTER — Encounter: Payer: Self-pay | Admitting: Endocrinology

## 2012-03-19 ENCOUNTER — Ambulatory Visit (INDEPENDENT_AMBULATORY_CARE_PROVIDER_SITE_OTHER): Payer: Medicare Other | Admitting: Endocrinology

## 2012-03-19 VITALS — BP 136/78 | HR 69 | Temp 97.6°F | Ht 62.0 in | Wt 126.0 lb

## 2012-03-19 DIAGNOSIS — E119 Type 2 diabetes mellitus without complications: Secondary | ICD-10-CM | POA: Diagnosis not present

## 2012-03-19 DIAGNOSIS — D509 Iron deficiency anemia, unspecified: Secondary | ICD-10-CM | POA: Diagnosis not present

## 2012-03-19 DIAGNOSIS — IMO0001 Reserved for inherently not codable concepts without codable children: Secondary | ICD-10-CM

## 2012-03-19 LAB — CBC WITH DIFFERENTIAL/PLATELET
Basophils Absolute: 0 10*3/uL (ref 0.0–0.1)
Basophils Relative: 0.6 % (ref 0.0–3.0)
Eosinophils Absolute: 0.2 10*3/uL (ref 0.0–0.7)
Eosinophils Relative: 4.8 % (ref 0.0–5.0)
HCT: 33.6 % — ABNORMAL LOW (ref 36.0–46.0)
Hemoglobin: 11.3 g/dL — ABNORMAL LOW (ref 12.0–15.0)
Lymphocytes Relative: 36.5 % (ref 12.0–46.0)
Lymphs Abs: 1.9 10*3/uL (ref 0.7–4.0)
MCHC: 33.7 g/dL (ref 30.0–36.0)
MCV: 90.4 fl (ref 78.0–100.0)
Monocytes Absolute: 0.6 10*3/uL (ref 0.1–1.0)
Monocytes Relative: 12.6 % — ABNORMAL HIGH (ref 3.0–12.0)
Neutro Abs: 2.3 10*3/uL (ref 1.4–7.7)
Neutrophils Relative %: 45.5 % (ref 43.0–77.0)
Platelets: 224 10*3/uL (ref 150.0–400.0)
RBC: 3.72 Mil/uL — ABNORMAL LOW (ref 3.87–5.11)
RDW: 13.5 % (ref 11.5–14.6)
WBC: 5.2 10*3/uL (ref 4.5–10.5)

## 2012-03-19 LAB — IBC PANEL
Iron: 52 ug/dL (ref 42–145)
Saturation Ratios: 16.5 % — ABNORMAL LOW (ref 20.0–50.0)
Transferrin: 225.2 mg/dL (ref 212.0–360.0)

## 2012-03-19 LAB — HEMOGLOBIN A1C: Hgb A1c MFr Bld: 7.2 % — ABNORMAL HIGH (ref 4.6–6.5)

## 2012-03-19 MED ORDER — PIOGLITAZONE HCL 15 MG PO TABS: 15.0000 mg | ORAL_TABLET | Freq: Every day | ORAL | Status: DC

## 2012-03-19 NOTE — Progress Notes (Signed)
Subjective:  Pt states 10 days of moderate pain at the right shoulder, worse in the context of ROM.  No assoc sxs. no local injury.  Past Medical History  Diagnosis Date  . Arrhythmia     paroxysmal atrial fibrillation  . Coronary artery disease   . Hypertension   . Hyperlipidemia   . Edema   . GERD (gastroesophageal reflux disease)   . Anemia B twelve deficiency   . Dizziness - giddy   . Abdominal pain   . Iron deficiency anemia, unspecified   . Pernicious anemia   . Thoracic or lumbosacral neuritis or radiculitis, unspecified   . Symptomatic menopausal or female climacteric states   . Lumbago   . Degeneration of lumbar or lumbosacral intervertebral disc   . Benign neoplasm of colon   . Diabetes mellitus without mention of complication   . Allergic rhinitis, cause unspecified     Past Surgical History  Procedure Date  . Abdominal hysterectomy 1974    and appendectomy  . Cholecystectomy   . Back surgery     History   Social History  . Marital Status: Widowed    Spouse Name: N/A    Number of Children: N/A  . Years of Education: N/A   Occupational History  . Not on file.   Social History Main Topics  . Smoking status: Never Smoker   . Smokeless tobacco: Not on file  . Alcohol Use: Not on file  . Drug Use: Not on file  . Sexually Active: Not on file   Other Topics Concern  . Not on file   Social History Narrative  . No narrative on file    Current Outpatient Prescriptions on File Prior to Visit  Medication Sig Dispense Refill  . aspirin 81 MG EC tablet Take 81 mg by mouth daily.        [name] FERREX 150 150 MG capsule TAKE 1 CAPSULE EVERY DAY  30 capsule  5  . JANUVIA 100 MG tablet TAKE ONE TABLET BY MOUTH EVERY DAY  90 each  2  . lisinopril-hydrochlorothiazide (PRINZIDE,ZESTORETIC) 20-12.5 MG per tablet TAKE 1 TABLET BY MOUTH ONCE DAILY  30 tablet  5  . loratadine-pseudoephedrine (CLARITIN-D 24-HOUR) 10-240 MG per 24 hr tablet Take 1 tablet by mouth. 1 daily  as needed for congestion       . metFORMIN (GLUCOPHAGE-XR) 500 MG 24 hr tablet TAKE ONE TABLET BY MOUTH TWICE DAILY  60 tablet  5  . metoprolol (TOPROL-XL) 50 MG 24 hr tablet Take 50 mg by mouth daily.       . nitroGLYCERIN (NITROSTAT) 0.4 MG SL tablet Place 0.4 mg under the tongue every 5 (five) minutes as needed.        . Omeprazole ([facility] OMEPRAZOLE) 20 MG TBEC Take 1 tablet (20 mg total) by mouth daily.  30 each  11  . pravastatin (PRAVACHOL) 40 MG tablet TAKE TWO TABLETS BY MOUTH AT BEDTIME  60 tablet  5  . traMADol (ULTRAM) 50 MG tablet Take 50 mg by mouth every 8 (eight) hours as needed.         Allergies  Allergen Reactions  . Celecoxib     REACTION: rash  . Erythromycin     REACTION: itching  . Nitrofurantoin     REACTION: rash  . Penicillins     REACTION: swelling of joints  . Piroxicam   . Povidone     REACTION: itching  . Sulfonamide Derivatives  REACTION: "like my head was full of water"    No family history on file.  BP 136/78  Pulse 69  Temp(Src) 97.6 F (36.4 C) (Oral)  Ht 5\' 2"  (1.575 m)  Wt 126 lb (57.153 kg)  BMI 23.05 kg/m2  SpO2 97%    Patient ID: Armed forces operational officer, female    DOB: 1925-11-11, 78 y.o.   MRN: [id number]  Diabetes   Review of Systems  denies numbness.  Denies pain at the right forearm and hand    Objective:   Physical Exam VITAL SIGNS:  See vs page GENERAL: no distress Right shoulder: full ROM, but ROM is painful Neuro: sensation is intact to touch on the RUE The rest of the RUE is normal      IMPRESSION: Shoulder pain, new, uncertain etiology DM, uncertain control.

## 2012-03-19 NOTE — Patient Instructions (Signed)
Here are some drops. Put 10 drops on your shoulder, 4x a day as needed.  Call if you want a prescription for a similar drug, in cream form. Refer to a specialist.  you will receive a phone call, about a day and time for an appointment

## 2012-03-20 ENCOUNTER — Telehealth: Payer: Self-pay | Admitting: *Deleted

## 2012-03-20 NOTE — Telephone Encounter (Signed)
Called pt to inform of lab results, pt informed (letter also mailed to pt). 

## 2012-03-24 ENCOUNTER — Encounter: Payer: Self-pay | Admitting: Endocrinology

## 2012-03-25 ENCOUNTER — Telehealth: Payer: Self-pay | Admitting: *Deleted

## 2012-03-25 NOTE — Telephone Encounter (Signed)
Called to inform pt of results of bone density scan, left message for pt to callback office (letter also mailed to pt).

## 2012-03-25 NOTE — Telephone Encounter (Signed)
Pt informed

## 2012-04-15 ENCOUNTER — Ambulatory Visit (INDEPENDENT_AMBULATORY_CARE_PROVIDER_SITE_OTHER): Payer: Medicare Other | Admitting: Endocrinology

## 2012-04-15 ENCOUNTER — Encounter: Payer: Self-pay | Admitting: Endocrinology

## 2012-04-15 VITALS — BP 130/70 | HR 69 | Temp 97.7°F | Ht 62.0 in | Wt 125.0 lb

## 2012-04-15 DIAGNOSIS — H6690 Otitis media, unspecified, unspecified ear: Secondary | ICD-10-CM | POA: Diagnosis not present

## 2012-04-15 DIAGNOSIS — R609 Edema, unspecified: Secondary | ICD-10-CM

## 2012-04-15 DIAGNOSIS — E119 Type 2 diabetes mellitus without complications: Secondary | ICD-10-CM | POA: Diagnosis not present

## 2012-04-15 MED ORDER — BROMOCRIPTINE MESYLATE 2.5 MG PO TABS: 1.2500 mg | ORAL_TABLET | Freq: Every day | ORAL | Status: DC

## 2012-04-15 MED ORDER — DOXYCYCLINE HYCLATE 100 MG PO TABS: 100.0000 mg | ORAL_TABLET | Freq: Two times a day (BID) | ORAL | Status: AC

## 2012-04-15 NOTE — Patient Instructions (Addendum)
i have sent a prescription to your pharmacy, for an antibiotic. I hope you feel better soon.  If you don't feel better by next week, please call back.   Please start taking "bromocriptine," to help your blood sugar. It has possible side effects of nausea and dizziness.  These go away with time.  You can avoid these by taking it at bedtime.   Stop actos. Please come back for a follow-up appointment in 3 months

## 2012-04-15 NOTE — Progress Notes (Signed)
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   MRN: [id number]  HPI Pt states few days of slight pain at the bilat maxillary areas, and assoc "fullness" sensation at both ears. Past Medical History  Diagnosis Date  . Arrhythmia     paroxysmal atrial fibrillation  . Coronary artery disease   . Hypertension   . Hyperlipidemia   . Edema   . GERD (gastroesophageal reflux disease)   . Anemia B twelve deficiency   . Dizziness - giddy   . Abdominal pain   . Iron deficiency anemia, unspecified   . Pernicious anemia   . Thoracic or lumbosacral neuritis or radiculitis, unspecified   . Symptomatic menopausal or female climacteric states   . Lumbago   . Degeneration of lumbar or lumbosacral intervertebral disc   . Benign neoplasm of colon   . Diabetes mellitus without mention of complication   . Allergic rhinitis, cause unspecified     Past Surgical History  Procedure Date  . Abdominal hysterectomy 1974    and appendectomy  . Cholecystectomy   . Back surgery     History   Social History  . Marital Status: Widowed    Spouse Name: N/A    Number of Children: N/A  . Years of Education: N/A   Occupational History  . Not on file.   Social History Main Topics  . Smoking status: Never Smoker   . Smokeless tobacco: Not on file  . Alcohol Use: Not on file  . Drug Use: Not on file  . Sexually Active: Not on file   Other Topics Concern  . Not on file   Social History Narrative  . No narrative on file    Current Outpatient Prescriptions on File Prior to Visit  Medication Sig Dispense Refill  . aspirin 81 MG EC tablet Take 81 mg by mouth daily.        [name] FERREX 150 150 MG capsule TAKE 1 CAPSULE EVERY DAY  30 capsule  5  . JANUVIA 100 MG tablet TAKE ONE TABLET BY MOUTH EVERY DAY  90 each  2  . lisinopril-hydrochlorothiazide (PRINZIDE,ZESTORETIC) 20-12.5 MG per tablet TAKE 1 TABLET BY MOUTH ONCE DAILY  30 tablet  5  .  loratadine-pseudoephedrine (CLARITIN-D 24-HOUR) 10-240 MG per 24 hr tablet Take 1 tablet by mouth. 1 daily as needed for congestion       . metoprolol (TOPROL-XL) 50 MG 24 hr tablet Take 50 mg by mouth daily.       . nitroGLYCERIN (NITROSTAT) 0.4 MG SL tablet Place 0.4 mg under the tongue every 5 (five) minutes as needed.        . Omeprazole ([facility] OMEPRAZOLE) 20 MG TBEC Take 1 tablet (20 mg total) by mouth daily.  30 each  11  . pravastatin (PRAVACHOL) 40 MG tablet TAKE TWO TABLETS BY MOUTH AT BEDTIME  60 tablet  5  . traMADol (ULTRAM) 50 MG tablet Take 50 mg by mouth every 8 (eight) hours as needed.       [name] DISCONTD: metFORMIN (GLUCOPHAGE-XR) 500 MG 24 hr tablet TAKE ONE TABLET BY MOUTH TWICE DAILY  60 tablet  5  . bromocriptine (PARLODEL) 2.5 MG tablet Take 0.5 tablets (1.25 mg total) by mouth daily.  15 tablet  11    Allergies  Allergen Reactions  . Celecoxib     REACTION: rash  . Erythromycin     REACTION: itching  . Nitrofurantoin  REACTION: rash  . Penicillins     REACTION: swelling of joints  . Piroxicam   . Povidone     REACTION: itching  . Sulfonamide Derivatives     REACTION: "like my head was full of water"    No family history on file.  BP 130/70  Pulse 69  Temp(Src) 97.7 F (36.5 C) (Oral)  Ht 5\' 2"  (1.575 m)  Wt 125 lb (56.7 kg)  BMI 22.86 kg/m2  SpO2 95%  Review of Systems Denies fever and cough    Objective:   Physical Exam VITAL SIGNS:  See vs page GENERAL: no distress head: no deformity eyes: no periorbital swelling, no proptosis external nose and ears are normal mouth: no lesion seen Both tm's are slightly red. LUNGS:  Clear to auscultation Ext: 1+ bilat edema    Lab Results  Component Value Date   HGBA1C 7.2* 03/19/2012      Assessment & Plan:  bilat aom, new Edema, prb due to actos DM, Needs increased rx, if it can be done with a regimen that avoids or minimizes hypoglycemia.

## 2012-04-17 ENCOUNTER — Telehealth: Payer: Self-pay | Admitting: Cardiology

## 2012-04-17 NOTE — Telephone Encounter (Signed)
New problem:  Per Patty - patient daughter calling to discuss medication.

## 2012-04-17 NOTE — Telephone Encounter (Signed)
Daughter calling to confirm that pt is on Toprol XL 50mg  and has prescription for  NTG as ordered by Dr. Daleen Squibb Reassurance given. Mylo Red RN

## 2012-04-21 ENCOUNTER — Other Ambulatory Visit: Payer: Self-pay | Admitting: Endocrinology

## 2012-04-24 ENCOUNTER — Telehealth: Payer: Self-pay | Admitting: *Deleted

## 2012-04-24 NOTE — Telephone Encounter (Signed)
Called daughter back had ? About Pioglitazone. Inform her that was generic actos and d/c taht on 04/15/12 and was change to bromcriptine. She states they didn't have rx. Inform her to contact [facility] sent on 04/15/12.... 04/24/12@9 :27am/LMB

## 2012-04-24 NOTE — Telephone Encounter (Signed)
Left msg on triage requesting call bck concerning mom medication... 04/24/12@9 :15am/LMB

## 2012-04-27 ENCOUNTER — Ambulatory Visit
Admission: RE | Admit: 2012-04-27 | Discharge: 2012-04-27 | Disposition: A | Discharge status: Home / Self Care | Payer: Medicare Other | Source: Ambulatory Visit | Attending: Gynecology | Admitting: Gynecology

## 2012-04-27 DIAGNOSIS — Z1231 Encounter for screening mammogram for malignant neoplasm of breast: Secondary | ICD-10-CM

## 2012-05-12 DIAGNOSIS — R300 Dysuria: Secondary | ICD-10-CM | POA: Diagnosis not present

## 2012-05-12 DIAGNOSIS — R339 Retention of urine, unspecified: Secondary | ICD-10-CM | POA: Diagnosis not present

## 2012-05-12 DIAGNOSIS — N952 Postmenopausal atrophic vaginitis: Secondary | ICD-10-CM | POA: Diagnosis not present

## 2012-05-12 DIAGNOSIS — R3989 Other symptoms and signs involving the genitourinary system: Secondary | ICD-10-CM | POA: Diagnosis not present

## 2012-05-20 DIAGNOSIS — E119 Type 2 diabetes mellitus without complications: Secondary | ICD-10-CM | POA: Diagnosis not present

## 2012-05-20 DIAGNOSIS — R350 Frequency of micturition: Secondary | ICD-10-CM | POA: Diagnosis not present

## 2012-05-20 DIAGNOSIS — N8184 Pelvic muscle wasting: Secondary | ICD-10-CM | POA: Diagnosis not present

## 2012-05-20 DIAGNOSIS — Z01419 Encounter for gynecological examination (general) (routine) without abnormal findings: Secondary | ICD-10-CM | POA: Diagnosis not present

## 2012-06-09 ENCOUNTER — Other Ambulatory Visit: Payer: Self-pay | Admitting: Cardiology

## 2012-07-15 ENCOUNTER — Ambulatory Visit (INDEPENDENT_AMBULATORY_CARE_PROVIDER_SITE_OTHER): Payer: Medicare Other | Admitting: Endocrinology

## 2012-07-15 ENCOUNTER — Encounter: Payer: Self-pay | Admitting: Endocrinology

## 2012-07-15 ENCOUNTER — Other Ambulatory Visit (INDEPENDENT_AMBULATORY_CARE_PROVIDER_SITE_OTHER): Payer: Medicare Other

## 2012-07-15 ENCOUNTER — Ambulatory Visit (INDEPENDENT_AMBULATORY_CARE_PROVIDER_SITE_OTHER)
Admission: RE | Admit: 2012-07-15 | Discharge: 2012-07-15 | Disposition: A | Discharge status: Home / Self Care | Payer: Medicare Other | Source: Ambulatory Visit | Attending: Endocrinology | Admitting: Endocrinology

## 2012-07-15 VITALS — BP 130/72 | HR 78 | Temp 97.3°F | Ht 62.0 in | Wt 133.0 lb

## 2012-07-15 DIAGNOSIS — E119 Type 2 diabetes mellitus without complications: Secondary | ICD-10-CM | POA: Diagnosis not present

## 2012-07-15 DIAGNOSIS — M545 Low back pain, unspecified: Secondary | ICD-10-CM

## 2012-07-15 DIAGNOSIS — M47817 Spondylosis without myelopathy or radiculopathy, lumbosacral region: Secondary | ICD-10-CM | POA: Diagnosis not present

## 2012-07-15 LAB — HEMOGLOBIN A1C: Hgb A1c MFr Bld: 6.5 % (ref 4.6–6.5)

## 2012-07-15 LAB — SEDIMENTATION RATE: Sed Rate: 26 mm/hr — ABNORMAL HIGH (ref 0–22)

## 2012-07-15 MED ORDER — TRAMADOL HCL 50 MG PO TABS: 50.0000 mg | ORAL_TABLET | Freq: Three times a day (TID) | ORAL | Status: DC | PRN

## 2012-07-15 NOTE — Patient Instructions (Addendum)
i have sent a prescription to your pharmacy, for the pain medication. blood tests, and x-rays, are being requested for you today.  You will receive a letter with results. Please come back for a "medicare wellness" appointment after 11/19/12. Make sure you are off the "pioglitazone."

## 2012-07-15 NOTE — Progress Notes (Signed)
Subjective:    Patient ID: [name], female    DOB: [birth year], 78 y.o.   MRN: [id number]  HPI Pt returns for f/u of type 2 DM (dx'ed 2009; complicated by CAD).  Edema persists.  She thinks she might still be on the actos Pt states few years of moderate low-back pain, but no assoc numbness.  She had surgery on her back in the 1980's.   Past Medical History  Diagnosis Date  . Arrhythmia     paroxysmal atrial fibrillation  . Coronary artery disease   . Hypertension   . Hyperlipidemia   . Edema   . GERD (gastroesophageal reflux disease)   . Anemia B twelve deficiency   . Dizziness - giddy   . Abdominal pain   . Iron deficiency anemia, unspecified   . Pernicious anemia   . Thoracic or lumbosacral neuritis or radiculitis, unspecified   . Symptomatic menopausal or female climacteric states   . Lumbago   . Degeneration of lumbar or lumbosacral intervertebral disc   . Benign neoplasm of colon   . Diabetes mellitus without mention of complication   . Allergic rhinitis, cause unspecified     Past Surgical History  Procedure Date  . Abdominal hysterectomy 1974    and appendectomy  . Cholecystectomy   . Back surgery     History   Social History  . Marital Status: Widowed    Spouse Name: N/A    Number of Children: N/A  . Years of Education: N/A   Occupational History  . Not on file.   Social History Main Topics  . Smoking status: Never Smoker   . Smokeless tobacco: Not on file  . Alcohol Use: Not on file  . Drug Use: Not on file  . Sexually Active: Not on file   Other Topics Concern  . Not on file   Social History Narrative  . No narrative on file    Current Outpatient Prescriptions on File Prior to Visit  Medication Sig Dispense Refill  . aspirin 81 MG EC tablet Take 81 mg by mouth daily.        . bromocriptine (PARLODEL) 2.5 MG tablet Take 0.5 tablets (1.25 mg total) by mouth daily.  15 tablet  11  . FERREX 150 150 MG capsule TAKE 1 CAPSULE EVERY DAY   30 capsule  5  . JANUVIA 100 MG tablet TAKE ONE TABLET BY MOUTH EVERY DAY  90 each  2  . lisinopril-hydrochlorothiazide (PRINZIDE,ZESTORETIC) 20-12.5 MG per tablet TAKE 1 TABLET BY MOUTH ONCE DAILY  30 tablet  5  . loratadine-pseudoephedrine (CLARITIN-D 24-HOUR) 10-240 MG per 24 hr tablet Take 1 tablet by mouth. 1 daily as needed for congestion       . metFORMIN (GLUCOPHAGE-XR) 500 MG 24 hr tablet TAKE ONE TABLET BY MOUTH TWICE DAILY  60 tablet  5  . metoprolol (TOPROL-XL) 50 MG 24 hr tablet Take 50 mg by mouth daily.       . metoprolol succinate (TOPROL-XL) 50 MG 24 hr tablet TAKE 1 TABLET EVERY DAY  30 tablet  10  . nitroGLYCERIN (NITROSTAT) 0.4 MG SL tablet Place 0.4 mg under the tongue every 5 (five) minutes as needed.        . Omeprazole ([facility] OMEPRAZOLE) 20 MG TBEC Take 1 tablet (20 mg total) by mouth daily.  30 each  11  . pravastatin (PRAVACHOL) 40 MG tablet TAKE TWO TABLETS BY MOUTH AT BEDTIME  60 tablet  5  Allergies  Allergen Reactions  . Celecoxib     REACTION: rash  . Erythromycin     REACTION: itching  . Nitrofurantoin     REACTION: rash  . Penicillins     REACTION: swelling of joints  . Piroxicam   . Povidone     REACTION: itching  . Sulfonamide Derivatives     REACTION: "like my head was full of water"    No family history on file.  BP 130/72  Pulse 78  Temp 97.3 F (36.3 C) (Oral)  Ht 5\' 2"  (1.575 m)  Wt 133 lb (60.328 kg)  BMI 24.33 kg/m2  SpO2 95%    Review of Systems Denies LOC and falls.      Objective:   Physical Exam VITAL SIGNS:  See vs page GENERAL: no distress Spine: nontender.  Old healed surgical scar, at the mid-lumbar area.  Ext: 1+ bilat leg edema  Lab Results  Component Value Date   HGBA1C 6.5 07/15/2012      Assessment & Plan:  Low-back pain, recurrent DM: well-controlled Edema persistent:  Pt says she may still be on the actos

## 2012-07-21 ENCOUNTER — Other Ambulatory Visit: Payer: Self-pay | Admitting: Endocrinology

## 2012-08-10 ENCOUNTER — Other Ambulatory Visit: Payer: Self-pay | Admitting: Endocrinology

## 2012-08-12 ENCOUNTER — Ambulatory Visit (INDEPENDENT_AMBULATORY_CARE_PROVIDER_SITE_OTHER): Payer: Medicare Other | Admitting: Cardiology

## 2012-08-12 ENCOUNTER — Encounter: Payer: Self-pay | Admitting: Cardiology

## 2012-08-12 VITALS — BP 142/78 | HR 74 | Ht 62.0 in | Wt 127.0 lb

## 2012-08-12 DIAGNOSIS — I4891 Unspecified atrial fibrillation: Secondary | ICD-10-CM

## 2012-08-12 DIAGNOSIS — I10 Essential (primary) hypertension: Secondary | ICD-10-CM | POA: Diagnosis not present

## 2012-08-12 DIAGNOSIS — R609 Edema, unspecified: Secondary | ICD-10-CM

## 2012-08-12 DIAGNOSIS — I2510 Atherosclerotic heart disease of native coronary artery without angina pectoris: Secondary | ICD-10-CM | POA: Diagnosis not present

## 2012-08-12 DIAGNOSIS — R05 Cough: Secondary | ICD-10-CM

## 2012-08-12 DIAGNOSIS — J309 Allergic rhinitis, unspecified: Secondary | ICD-10-CM

## 2012-08-12 DIAGNOSIS — R002 Palpitations: Secondary | ICD-10-CM

## 2012-08-12 DIAGNOSIS — R059 Cough, unspecified: Secondary | ICD-10-CM

## 2012-08-12 DIAGNOSIS — R109 Unspecified abdominal pain: Secondary | ICD-10-CM

## 2012-08-12 NOTE — Assessment & Plan Note (Signed)
No recurrence in years. Ordered a fall risk in the past. Seems very stable. We'll not begin anticoagulation. Continue aspirin 81 mg a day.

## 2012-08-12 NOTE — Progress Notes (Signed)
HPI [name] returns today for evaluation and management of her history of coronary artery disease, paroxysmal A. fib, history of syncope with no recurrence. She also has hypertension and hyperlipidemia. This is being managed by Dr. Everardo All.  She's very spunky for her age and has no complaints. Her daughter confirms. She's had no falls. No recurrent syncope. She denies any palpitations or chest pain. She remains in sinus rhythm.  Past Medical History  Diagnosis Date  . Arrhythmia     paroxysmal atrial fibrillation  . Coronary artery disease   . Hypertension   . Hyperlipidemia   . Edema   . GERD (gastroesophageal reflux disease)   . Anemia B twelve deficiency   . Dizziness - giddy   . Abdominal pain   . Iron deficiency anemia, unspecified   . Pernicious anemia   . Thoracic or lumbosacral neuritis or radiculitis, unspecified   . Symptomatic menopausal or female climacteric states   . Lumbago   . Degeneration of lumbar or lumbosacral intervertebral disc   . Benign neoplasm of colon   . Diabetes mellitus without mention of complication   . Allergic rhinitis, cause unspecified     Current Outpatient Prescriptions  Medication Sig Dispense Refill  . aspirin 81 MG EC tablet Take 81 mg by mouth daily.        [name] FERREX 150 150 MG capsule TAKE 1 CAPSULE EVERY DAY  30 capsule  5  . JANUVIA 100 MG tablet TAKE ONE TABLET BY MOUTH EVERY DAY  90 each  2  . lisinopril-hydrochlorothiazide (PRINZIDE,ZESTORETIC) 20-12.5 MG per tablet TAKE 1 TABLET BY MOUTH ONCE DAILY  30 tablet  5  . loratadine-pseudoephedrine (CLARITIN-D 24-HOUR) 10-240 MG per 24 hr tablet Take 1 tablet by mouth. 1 daily as needed for congestion       . metFORMIN (GLUCOPHAGE-XR) 500 MG 24 hr tablet TAKE ONE TABLET BY MOUTH TWICE DAILY  60 tablet  5  . metoprolol (TOPROL-XL) 50 MG 24 hr tablet Take 50 mg by mouth daily.       . metoprolol succinate (TOPROL-XL) 50 MG 24 hr tablet TAKE 1 TABLET EVERY DAY  30 tablet  10  .  nitroGLYCERIN (NITROSTAT) 0.4 MG SL tablet Place 0.4 mg under the tongue every 5 (five) minutes as needed.        . Omeprazole ([facility] OMEPRAZOLE) 20 MG TBEC Take 1 tablet (20 mg total) by mouth daily.  30 each  11  . pravastatin (PRAVACHOL) 40 MG tablet TAKE TWO TABLETS BY MOUTH AT BEDTIME  60 tablet  11  . traMADol (ULTRAM) 50 MG tablet Take 1 tablet (50 mg total) by mouth every 8 (eight) hours as needed.  50 tablet  5  . bromocriptine (PARLODEL) 2.5 MG tablet TAKE 0.5 TABLETS (1.25 MG TOTAL) BY MOUTH DAILY.  15 tablet  3    Allergies  Allergen Reactions  . Celecoxib     REACTION: rash  . Erythromycin     REACTION: itching  . Nitrofurantoin     REACTION: rash  . Penicillins     REACTION: swelling of joints  . Piroxicam   . Povidone     REACTION: itching  . Sulfonamide Derivatives     REACTION: "like my head was full of water"    No family history on file.  History   Social History  . Marital Status: Widowed    Spouse Name: N/A    Number of Children: N/A  . Years of Education: N/A  Occupational History  . Not on file.   Social History Main Topics  . Smoking status: Never Smoker   . Smokeless tobacco: Not on file  . Alcohol Use: Not on file  . Drug Use: Not on file  . Sexually Active: Not on file   Other Topics Concern  . Not on file   Social History Narrative  . No narrative on file    ROS ALL NEGATIVE EXCEPT THOSE NOTED IN HPI  PE  General Appearance: well developed, well nourished in no acute distress HEENT: symmetrical face, PERRLA, good dentition  Neck: no JVD, thyromegaly, or adenopathy, trachea midline Chest: symmetric without deformity Cardiac: PMI non-displaced, RRR, normal S1, S2, no gallop or murmur Lung: clear to ausculation and percussion Vascular: all pulses full without bruits  Abdominal: nondistended, nontender, good bowel sounds, no HSM, no bruits Extremities: no cyanosis, clubbing or edema, no sign of DVT, no varicosities  Skin: normal  color, no rashes Neuro: alert and oriented x 3, non-focal Pysch: normal affect  EKG Normal sinus rhythm, first-degree block, otherwise normal. BMET    Component Value Date/Time   NA 138 11/20/2011 0833   K 3.8 11/20/2011 0833   CL 105 11/20/2011 0833   CO2 25 11/20/2011 0833   GLUCOSE 129* 11/20/2011 0833   BUN 18 11/20/2011 0833   CREATININE 1.0 11/20/2011 0833   CALCIUM 9.6 11/20/2011 0833   GFRNONAA 76.74 10/09/2010 1159   GFRAA  Value: >60        The eGFR has been calculated using the MDRD equation. This calculation has not been validated in all clinical situations. eGFR's persistently <60 mL/min signify possible Chronic Kidney Disease. 03/25/2010 0416    Lipid Panel     Component Value Date/Time   CHOL 172 11/20/2011 0833   TRIG 73.0 11/20/2011 0833   HDL 68.60 11/20/2011 0833   CHOLHDL 3 11/20/2011 0833   VLDL 14.6 11/20/2011 0833   LDLCALC 89 11/20/2011 0833    CBC    Component Value Date/Time   WBC 5.2 03/19/2012 1126   RBC 3.72* 03/19/2012 1126   HGB 11.3* 03/19/2012 1126   HCT 33.6* 03/19/2012 1126   PLT 224.0 03/19/2012 1126   MCV 90.4 03/19/2012 1126   MCHC 33.7 03/19/2012 1126   RDW 13.5 03/19/2012 1126   LYMPHSABS 1.9 03/19/2012 1126   MONOABS 0.6 03/19/2012 1126   EOSABS 0.2 03/19/2012 1126   BASOSABS 0.0 03/19/2012 1126

## 2012-08-12 NOTE — Assessment & Plan Note (Signed)
Stable. Continue current medical therapy. Return the office in one year. 

## 2012-08-12 NOTE — Patient Instructions (Addendum)
Your physician recommends that you continue on your current medications as directed. Please refer to the Current Medication list given to you today.   Your physician wants you to follow-up in: 1 year with Dr. Wall. You will receive a reminder letter in the mail two months in advance. If you don't receive a letter, please call our office to schedule the follow-up appointment.  

## 2012-08-12 NOTE — Assessment & Plan Note (Signed)
Resolved

## 2012-08-14 ENCOUNTER — Other Ambulatory Visit: Payer: Self-pay | Admitting: Endocrinology

## 2012-08-21 ENCOUNTER — Other Ambulatory Visit: Payer: Self-pay | Admitting: Endocrinology

## 2012-08-21 MED ORDER — BROMOCRIPTINE MESYLATE 2.5 MG PO TABS: 2.5000 mg | ORAL_TABLET | Freq: Every day | ORAL | Status: DC

## 2012-08-27 ENCOUNTER — Ambulatory Visit (INDEPENDENT_AMBULATORY_CARE_PROVIDER_SITE_OTHER): Payer: Medicare Other | Admitting: General Practice

## 2012-08-27 DIAGNOSIS — Z23 Encounter for immunization: Secondary | ICD-10-CM | POA: Diagnosis not present

## 2012-10-20 ENCOUNTER — Ambulatory Visit: Payer: Medicare Other | Admitting: Endocrinology

## 2012-10-26 ENCOUNTER — Ambulatory Visit (INDEPENDENT_AMBULATORY_CARE_PROVIDER_SITE_OTHER): Payer: Medicare Other | Admitting: Endocrinology

## 2012-10-26 ENCOUNTER — Encounter: Payer: Self-pay | Admitting: Endocrinology

## 2012-10-26 VITALS — BP 124/76 | HR 74 | Temp 97.8°F | Wt 126.0 lb

## 2012-10-26 DIAGNOSIS — J309 Allergic rhinitis, unspecified: Secondary | ICD-10-CM | POA: Diagnosis not present

## 2012-10-26 MED ORDER — FLUTICASONE PROPIONATE 50 MCG/ACT NA SUSP: 2.0000 | Freq: Every day | NASAL | Status: DC

## 2012-10-26 NOTE — Patient Instructions (Addendum)
i have sent a prescription to your pharmacy, for the nasal symptoms.   Take "simethicone" as needed for your stomach symptoms. Please come back for a "medicare wellness" appointment after 11/19/12.

## 2012-10-26 NOTE — Progress Notes (Signed)
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   MRN: [id number]  HPI Pt states 1 month of rhinorrhea from both nares, but no assoc congestion.   Past Medical History  Diagnosis Date  . Arrhythmia     paroxysmal atrial fibrillation  . Coronary artery disease   . Hypertension   . Hyperlipidemia   . Edema   . GERD (gastroesophageal reflux disease)   . Anemia B twelve deficiency   . Dizziness - giddy   . Abdominal pain   . Iron deficiency anemia, unspecified   . Pernicious anemia   . Thoracic or lumbosacral neuritis or radiculitis, unspecified   . Symptomatic menopausal or female climacteric states   . Lumbago   . Degeneration of lumbar or lumbosacral intervertebral disc   . Benign neoplasm of colon   . Diabetes mellitus without mention of complication   . Allergic rhinitis, cause unspecified     Past Surgical History  Procedure Date  . Abdominal hysterectomy 1974    and appendectomy  . Cholecystectomy   . Back surgery     History   Social History  . Marital Status: Widowed    Spouse Name: N/A    Number of Children: N/A  . Years of Education: N/A   Occupational History  . Not on file.   Social History Main Topics  . Smoking status: Never Smoker   . Smokeless tobacco: Not on file  . Alcohol Use: Not on file  . Drug Use: Not on file  . Sexually Active: Not on file   Other Topics Concern  . Not on file   Social History Narrative  . No narrative on file    Current Outpatient Prescriptions on File Prior to Visit  Medication Sig Dispense Refill  . aspirin 81 MG EC tablet Take 81 mg by mouth daily.        . bromocriptine (PARLODEL) 2.5 MG tablet Take 1 tablet (2.5 mg total) by mouth at bedtime.  30 tablet  11  . FERREX 150 150 MG capsule TAKE 1 CAPSULE EVERY DAY  30 capsule  5  . JANUVIA 100 MG tablet TAKE ONE TABLET BY MOUTH EVERY DAY  90 each  2  . lisinopril-hydrochlorothiazide (PRINZIDE,ZESTORETIC) 20-12.5 MG per tablet  TAKE 1 TABLET BY MOUTH ONCE DAILY  30 tablet  5  . loratadine-pseudoephedrine (CLARITIN-D 24-HOUR) 10-240 MG per 24 hr tablet Take 1 tablet by mouth. 1 daily as needed for congestion       . metFORMIN (GLUCOPHAGE-XR) 500 MG 24 hr tablet TAKE ONE TABLET BY MOUTH TWICE DAILY  60 tablet  5  . metoprolol (TOPROL-XL) 50 MG 24 hr tablet Take 50 mg by mouth daily.       . metoprolol succinate (TOPROL-XL) 50 MG 24 hr tablet TAKE 1 TABLET EVERY DAY  30 tablet  10  . nitroGLYCERIN (NITROSTAT) 0.4 MG SL tablet Place 0.4 mg under the tongue every 5 (five) minutes as needed.        . Omeprazole ([facility] OMEPRAZOLE) 20 MG TBEC Take 1 tablet (20 mg total) by mouth daily.  30 each  11  . pravastatin (PRAVACHOL) 40 MG tablet TAKE TWO TABLETS BY MOUTH AT BEDTIME  60 tablet  11  . traMADol (ULTRAM) 50 MG tablet Take 1 tablet (50 mg total) by mouth every 8 (eight) hours as needed.  50 tablet  5  . fluticasone (FLONASE) 50 MCG/ACT nasal spray Place 2 sprays  into the nose daily.  16 g  6    Allergies  Allergen Reactions  . Celecoxib     REACTION: rash  . Erythromycin     REACTION: itching  . Nitrofurantoin     REACTION: rash  . Penicillins     REACTION: swelling of joints  . Piroxicam   . Povidone     REACTION: itching  . Sulfonamide Derivatives     REACTION: "like my head was full of water"    No family history on file.  BP 124/76  Pulse 74  Temp 97.8 F (36.6 C) (Oral)  Wt 126 lb (57.153 kg)  SpO2 96%  Review of Systems Denies earache and fever, but she has burping and flatulence.    Objective:   Physical Exam VITAL SIGNS:  See vs page GENERAL: no distress head: no deformity eyes: no periorbital swelling, no proptosis external nose and ears are normal mouth: no lesion seen Both eac's and tm's are normal     Assessment & Plan:  Allergic rhinitis, new GI sxs, new, uncertain etiology

## 2012-11-17 ENCOUNTER — Other Ambulatory Visit: Payer: Self-pay | Admitting: *Deleted

## 2012-11-17 MED ORDER — POLYSACCHARIDE IRON COMPLEX 150 MG PO CAPS: 150.0000 mg | ORAL_CAPSULE | Freq: Every day | ORAL | Status: DC

## 2012-11-20 ENCOUNTER — Ambulatory Visit (INDEPENDENT_AMBULATORY_CARE_PROVIDER_SITE_OTHER): Payer: Medicare Other | Admitting: Endocrinology

## 2012-11-20 VITALS — BP 122/80 | HR 82 | Temp 97.8°F | Wt 131.0 lb

## 2012-11-20 DIAGNOSIS — I4891 Unspecified atrial fibrillation: Secondary | ICD-10-CM

## 2012-11-20 DIAGNOSIS — D509 Iron deficiency anemia, unspecified: Secondary | ICD-10-CM

## 2012-11-20 DIAGNOSIS — E785 Hyperlipidemia, unspecified: Secondary | ICD-10-CM

## 2012-11-20 DIAGNOSIS — E871 Hypo-osmolality and hyponatremia: Secondary | ICD-10-CM

## 2012-11-20 DIAGNOSIS — E119 Type 2 diabetes mellitus without complications: Secondary | ICD-10-CM

## 2012-11-20 DIAGNOSIS — Z0000 Encounter for general adult medical examination without abnormal findings: Secondary | ICD-10-CM | POA: Diagnosis not present

## 2012-11-20 DIAGNOSIS — Z79899 Other long term (current) drug therapy: Secondary | ICD-10-CM

## 2012-11-20 DIAGNOSIS — I10 Essential (primary) hypertension: Secondary | ICD-10-CM | POA: Diagnosis not present

## 2012-11-20 LAB — CBC WITH DIFFERENTIAL/PLATELET
Basophils Absolute: 0 10*3/uL (ref 0.0–0.1)
Basophils Relative: 0 % (ref 0–1)
Eosinophils Absolute: 0.2 10*3/uL (ref 0.0–0.7)
Eosinophils Relative: 4 % (ref 0–5)
HCT: 32.5 % — ABNORMAL LOW (ref 36.0–46.0)
Hemoglobin: 10.7 g/dL — ABNORMAL LOW (ref 12.0–15.0)
Lymphocytes Relative: 35 % (ref 12–46)
Lymphs Abs: 1.8 10*3/uL (ref 0.7–4.0)
MCH: 29.1 pg (ref 26.0–34.0)
MCHC: 32.9 g/dL (ref 30.0–36.0)
MCV: 88.3 fL (ref 78.0–100.0)
Monocytes Absolute: 0.7 10*3/uL (ref 0.1–1.0)
Monocytes Relative: 13 % — ABNORMAL HIGH (ref 3–12)
Neutro Abs: 2.4 10*3/uL (ref 1.7–7.7)
Neutrophils Relative %: 48 % (ref 43–77)
Platelets: 240 10*3/uL (ref 150–400)
RBC: 3.68 MIL/uL — ABNORMAL LOW (ref 3.87–5.11)
RDW: 14.5 % (ref 11.5–15.5)
WBC: 5.1 10*3/uL (ref 4.0–10.5)

## 2012-11-20 LAB — IBC PANEL
%SAT: 13 % — ABNORMAL LOW (ref 20–55)
TIBC: 308 ug/dL (ref 250–470)
UIBC: 268 ug/dL (ref 125–400)

## 2012-11-20 LAB — BASIC METABOLIC PANEL
BUN: 24 mg/dL — ABNORMAL HIGH (ref 6–23)
CO2: 26 mEq/L (ref 19–32)
Calcium: 9.8 mg/dL (ref 8.4–10.5)
Chloride: 103 mEq/L (ref 96–112)
Creat: 1.06 mg/dL (ref 0.50–1.10)
Glucose, Bld: 121 mg/dL — ABNORMAL HIGH (ref 70–99)
Potassium: 3.9 mEq/L (ref 3.5–5.3)
Sodium: 138 mEq/L (ref 135–145)

## 2012-11-20 LAB — HEPATIC FUNCTION PANEL
ALT: 12 U/L (ref 0–35)
AST: 21 U/L (ref 0–37)
Albumin: 4.2 g/dL (ref 3.5–5.2)
Alkaline Phosphatase: 71 U/L (ref 39–117)
Bilirubin, Direct: 0.1 mg/dL (ref 0.0–0.3)
Indirect Bilirubin: 0.5 mg/dL (ref 0.0–0.9)
Total Bilirubin: 0.6 mg/dL (ref 0.3–1.2)
Total Protein: 6.8 g/dL (ref 6.0–8.3)

## 2012-11-20 LAB — LIPID PANEL
Cholesterol: 156 mg/dL (ref 0–200)
HDL: 58 mg/dL (ref >39)
LDL Cholesterol: 69 mg/dL (ref 0–99)
Total CHOL/HDL Ratio: 2.7 Ratio
Triglycerides: 147 mg/dL (ref <150)
VLDL: 29 mg/dL (ref 0–40)

## 2012-11-20 LAB — IRON: Iron: 40 ug/dL — ABNORMAL LOW (ref 42–145)

## 2012-11-20 LAB — HEMOGLOBIN A1C
Hgb A1c MFr Bld: 6.8 % — ABNORMAL HIGH (ref <5.7)
Mean Plasma Glucose: 148 mg/dL — ABNORMAL HIGH (ref <117)

## 2012-11-20 LAB — TSH: TSH: 2.879 u[IU]/mL (ref 0.350–4.500)

## 2012-11-20 NOTE — Progress Notes (Signed)
Subjective:    Patient ID: [name], female    DOB: Mar 03, 1925, 79 y.o.   MRN: [id number]  HPI The state of at least three ongoing medical problems is addressed today, with interval history of each noted here: HTN: she denies headache. DM: she denies weight change. Anemia: she denies brbpr. Past Medical History  Diagnosis Date  . Arrhythmia     paroxysmal atrial fibrillation  . Coronary artery disease   . Hypertension   . Hyperlipidemia   . Edema   . GERD (gastroesophageal reflux disease)   . Anemia B twelve deficiency   . Dizziness - giddy   . Abdominal pain   . Iron deficiency anemia, unspecified   . Pernicious anemia   . Thoracic or lumbosacral neuritis or radiculitis, unspecified   . Symptomatic menopausal or female climacteric states   . Lumbago   . Degeneration of lumbar or lumbosacral intervertebral disc   . Benign neoplasm of colon   . Diabetes mellitus without mention of complication   . Allergic rhinitis, cause unspecified     Past Surgical History  Procedure Date  . Abdominal hysterectomy 1974    and appendectomy  . Cholecystectomy   . Back surgery     History   Social History  . Marital Status: Widowed    Spouse Name: N/A    Number of Children: N/A  . Years of Education: N/A   Occupational History  . Not on file.   Social History Main Topics  . Smoking status: Never Smoker   . Smokeless tobacco: Not on file  . Alcohol Use: Not on file  . Drug Use: Not on file  . Sexually Active: Not on file   Other Topics Concern  . Not on file   Social History Narrative  . No narrative on file    Current Outpatient Prescriptions on File Prior to Visit  Medication Sig Dispense Refill  . aspirin 81 MG EC tablet Take 81 mg by mouth daily.        . bromocriptine (PARLODEL) 2.5 MG tablet Take 1 tablet (2.5 mg total) by mouth at bedtime.  30 tablet  11  . fluticasone (FLONASE) 50 MCG/ACT nasal spray Place 2 sprays into the nose daily.  16 g  6  .  iron polysaccharides (FERREX 150) 150 MG capsule Take 1 capsule (150 mg total) by mouth daily.  30 capsule  5  . JANUVIA 100 MG tablet TAKE ONE TABLET BY MOUTH EVERY DAY  90 each  2  . lisinopril-hydrochlorothiazide (PRINZIDE,ZESTORETIC) 20-12.5 MG per tablet TAKE 1 TABLET BY MOUTH ONCE DAILY  30 tablet  5  . loratadine-pseudoephedrine (CLARITIN-D 24-HOUR) 10-240 MG per 24 hr tablet Take 1 tablet by mouth. 1 daily as needed for congestion       . metFORMIN (GLUCOPHAGE-XR) 500 MG 24 hr tablet TAKE ONE TABLET BY MOUTH TWICE DAILY  60 tablet  5  . metoprolol (TOPROL-XL) 50 MG 24 hr tablet Take 50 mg by mouth daily.       . metoprolol succinate (TOPROL-XL) 50 MG 24 hr tablet TAKE 1 TABLET EVERY DAY  30 tablet  10  . nitroGLYCERIN (NITROSTAT) 0.4 MG SL tablet Place 0.4 mg under the tongue every 5 (five) minutes as needed.        . Omeprazole ([facility] OMEPRAZOLE) 20 MG TBEC Take 1 tablet (20 mg total) by mouth daily.  30 each  11  . pravastatin (PRAVACHOL) 40 MG tablet TAKE TWO TABLETS BY MOUTH AT  BEDTIME  60 tablet  11  . traMADol (ULTRAM) 50 MG tablet Take 1 tablet (50 mg total) by mouth every 8 (eight) hours as needed.  50 tablet  5    Allergies  Allergen Reactions  . Celecoxib     REACTION: rash  . Erythromycin     REACTION: itching  . Nitrofurantoin     REACTION: rash  . Penicillins     REACTION: swelling of joints  . Piroxicam   . Povidone     REACTION: itching  . Sulfonamide Derivatives     REACTION: "like my head was full of water"    No family history on file.  BP 122/80  Pulse 82  Temp 97.8 F (36.6 C) (Oral)  Wt 131 lb (59.421 kg)  SpO2 99%  Review of Systems Denies hematuria and chest pain    Objective:   Physical Exam VITAL SIGNS:  See vs page GENERAL: no distress NECK: There is no palpable thyroid enlargement.  No thyroid nodule is palpable.  No palpable lymphadenopathy at the anterior neck. LUNGS:  Clear to auscultation HEART:  Regular rate and rhythm without  murmurs noted. Normal S1,S2.   Pulses: dorsalis pedis intact bilat.  no carotid bruit  Feet: no deformity.  no ulcer on the feet.  feet are of normal color and temp.  There is bilateral onychomycosis, varicosities, and trace leg edema Neuro: sensation is intact to touch on the feet.  Lab Results  Component Value Date   WBC 5.1 11/20/2012   HGB 10.7* 11/20/2012   HCT 32.5* 11/20/2012   PLT 240 11/20/2012   GLUCOSE 121* 11/20/2012   CHOL 156 11/20/2012   TRIG 147 11/20/2012   HDL 58 11/20/2012   LDLDIRECT 155.3 08/11/2008   LDLCALC 69 11/20/2012   ALT 12 11/20/2012   AST 21 11/20/2012   NA 138 11/20/2012   K 3.9 11/20/2012   CL 103 11/20/2012   CREATININE 1.06 11/20/2012   BUN 24* 11/20/2012   CO2 26 11/20/2012   TSH 2.879 11/20/2012   INR 1.14 02/18/2010   HGBA1C 6.8* 11/20/2012   MICROALBUR 0.77 11/20/2012      Assessment & Plan:  DM: well-controlled fe-deficiency anemia, needs increased rx HTN: well-controlled     Subjective:   Patient here for Medicare annual wellness visit and management of other chronic and acute problems.     Risk factors: advanced age    Roster of Physicians Providing Medical Care to Patient:  See "snapshot"   Activities of Daily Living: In your present state of health, do you have any difficulty performing the following activities?: (pt lives with dtr) Preparing food and eating?: No  Bathing yourself: No  Getting dressed: No  Using the toilet:No  Moving around from place to place: No  In the past year have you fallen or had a near fall?: No    Home Safety: Has smoke detector and wears seat belts. Firearms are safely stored.   Diet and Exercise  Current exercise habits:  Pt says good Dietary issues discussed: pt reports a healthy diet   Depression Screen  Q1: Over the past two weeks, have you felt down, depressed or hopeless? no  Q2: Over the past two weeks, have you felt little interest or pleasure in doing things? no   The following portions of the  patient's history were reviewed and updated as appropriate: allergies, current medications, past family history, past medical history, past social history, past surgical history and problem list.  Past Medical History  Diagnosis Date  . Arrhythmia     paroxysmal atrial fibrillation  . Coronary artery disease   . Hypertension   . Hyperlipidemia   . Edema   . GERD (gastroesophageal reflux disease)   . Anemia B twelve deficiency   . Dizziness - giddy   . Abdominal pain   . Iron deficiency anemia, unspecified   . Pernicious anemia   . Thoracic or lumbosacral neuritis or radiculitis, unspecified   . Symptomatic menopausal or female climacteric states   . Lumbago   . Degeneration of lumbar or lumbosacral intervertebral disc   . Benign neoplasm of colon   . Diabetes mellitus without mention of complication   . Allergic rhinitis, cause unspecified     Past Surgical History  Procedure Date  . Abdominal hysterectomy 1974    and appendectomy  . Cholecystectomy   . Back surgery     History   Social History  . Marital Status: Widowed    Spouse Name: N/A    Number of Children: N/A  . Years of Education: N/A   Occupational History  . Not on file.   Social History Main Topics  . Smoking status: Never Smoker   . Smokeless tobacco: Not on file  . Alcohol Use: Not on file  . Drug Use: Not on file  . Sexually Active: Not on file   Other Topics Concern  . Not on file   Social History Narrative  . No narrative on file    Current Outpatient Prescriptions on File Prior to Visit  Medication Sig Dispense Refill  . aspirin 81 MG EC tablet Take 81 mg by mouth daily.        . bromocriptine (PARLODEL) 2.5 MG tablet Take 1 tablet (2.5 mg total) by mouth at bedtime.  30 tablet  11  . fluticasone (FLONASE) 50 MCG/ACT nasal spray Place 2 sprays into the nose daily.  16 g  6  . iron polysaccharides (FERREX 150) 150 MG capsule Take 1 capsule (150 mg total) by mouth daily.  30 capsule  5  .  JANUVIA 100 MG tablet TAKE ONE TABLET BY MOUTH EVERY DAY  90 each  2  . lisinopril-hydrochlorothiazide (PRINZIDE,ZESTORETIC) 20-12.5 MG per tablet TAKE 1 TABLET BY MOUTH ONCE DAILY  30 tablet  5  . loratadine-pseudoephedrine (CLARITIN-D 24-HOUR) 10-240 MG per 24 hr tablet Take 1 tablet by mouth. 1 daily as needed for congestion       . metFORMIN (GLUCOPHAGE-XR) 500 MG 24 hr tablet TAKE ONE TABLET BY MOUTH TWICE DAILY  60 tablet  5  . metoprolol (TOPROL-XL) 50 MG 24 hr tablet Take 50 mg by mouth daily.       . metoprolol succinate (TOPROL-XL) 50 MG 24 hr tablet TAKE 1 TABLET EVERY DAY  30 tablet  10  . nitroGLYCERIN (NITROSTAT) 0.4 MG SL tablet Place 0.4 mg under the tongue every 5 (five) minutes as needed.        . Omeprazole ([facility] OMEPRAZOLE) 20 MG TBEC Take 1 tablet (20 mg total) by mouth daily.  30 each  11  . pravastatin (PRAVACHOL) 40 MG tablet TAKE TWO TABLETS BY MOUTH AT BEDTIME  60 tablet  11  . traMADol (ULTRAM) 50 MG tablet Take 1 tablet (50 mg total) by mouth every 8 (eight) hours as needed.  50 tablet  5    Allergies  Allergen Reactions  . Celecoxib     REACTION: rash  . Erythromycin  REACTION: itching  . Nitrofurantoin     REACTION: rash  . Penicillins     REACTION: swelling of joints  . Piroxicam   . Povidone     REACTION: itching  . Sulfonamide Derivatives     REACTION: "like my head was full of water"    No family history on file.  BP 122/80  Pulse 82  Temp 97.8 F (36.6 C) (Oral)  Wt 131 lb (59.421 kg)  SpO2 99%   Review of Systems  Denies hearing loss, and visual loss Objective:   Vision:  Sees opthalmologist Hearing: grossly normal Body mass index:  See vs page Msk: pt easily and quickly performs "get-up-and-go" from a sitting position Cognitive Impairment Assessment: cognition, memory and judgment appear normal.  remembers 3/3 at 5 minutes.  excellent recall.  can easily read and write a sentence.  alert and oriented x 3 (except she says  11/13/12)   Assessment:   Medicare wellness utd on preventive parameters    Plan:   During the course of the visit the patient was educated and counseled about appropriate screening and preventive services including:       Fall prevention   Screening mammography  Bone densitometry screening  Diabetes screening  Nutrition counseling   Vaccines / LABS Zostavax / Pnemonccoal Vaccine  today   Patient Instructions (the written plan) was given to the patient.

## 2012-11-20 NOTE — Patient Instructions (Addendum)
please consider these measures for your health:  minimize alcohol.  do not use tobacco products.  have a colonoscopy at least every 10 years from age 77.  Women should have an annual mammogram from age 65.  keep firearms safely stored.  always use seat belts.  have working smoke alarms in your home.  see an eye doctor and dentist regularly.  never drive under the influence of alcohol or drugs (including prescription drugs).   please let me know what your wishes would be, if artificial life support measures should become necessary.  it is critically important to prevent falling down (keep floor areas well-lit, dry, and free of loose objects.  If you have a cane, walker, or wheelchair, you should use it, even for short trips around the house.  Also, try not to rush).   Please come back for a follow-up appointment in 6 months.   blood tests are being requested for you today.  We'll contact you with results.  good diet and exercise habits significanly improve the control of your diabetes.  please let me know if you wish to be referred to a dietician.  high blood sugar is very risky to your health.  you should see an eye doctor every year.  You are at higher than average risk for pneumonia and hepatitis-B.  You should be vaccinated against both.

## 2012-11-21 LAB — URINALYSIS, ROUTINE W REFLEX MICROSCOPIC
Bilirubin Urine: NEGATIVE
Glucose, UA: NEGATIVE mg/dL
Hgb urine dipstick: NEGATIVE
Ketones, ur: NEGATIVE mg/dL
Nitrite: NEGATIVE
Protein, ur: NEGATIVE mg/dL
Specific Gravity, Urine: 1.009 (ref 1.005–1.030)
Urobilinogen, UA: 0.2 mg/dL (ref 0.0–1.0)
pH: 5.5 (ref 5.0–8.0)

## 2012-11-21 LAB — MICROALBUMIN / CREATININE URINE RATIO
Creatinine, Urine: 48 mg/dL
Microalb Creat Ratio: 16 mg/g (ref 0.0–30.0)
Microalb, Ur: 0.77 mg/dL (ref 0.00–1.89)

## 2012-11-21 LAB — URINALYSIS, MICROSCOPIC ONLY
Bacteria, UA: NONE SEEN
Casts: NONE SEEN
Crystals: NONE SEEN
Squamous Epithelial / LPF: NONE SEEN

## 2012-11-25 ENCOUNTER — Other Ambulatory Visit: Payer: Self-pay

## 2012-11-25 MED ORDER — POLYSACCHARIDE IRON COMPLEX 150 MG PO CAPS: 150.0000 mg | ORAL_CAPSULE | Freq: Two times a day (BID) | ORAL | Status: DC

## 2013-02-03 ENCOUNTER — Encounter: Payer: Self-pay | Admitting: Endocrinology

## 2013-02-03 ENCOUNTER — Ambulatory Visit (INDEPENDENT_AMBULATORY_CARE_PROVIDER_SITE_OTHER): Payer: Medicare Other | Admitting: Endocrinology

## 2013-02-03 VITALS — BP 126/70 | HR 80 | Wt 131.0 lb

## 2013-02-03 DIAGNOSIS — D509 Iron deficiency anemia, unspecified: Secondary | ICD-10-CM

## 2013-02-03 LAB — CBC WITH DIFFERENTIAL/PLATELET
Basophils Absolute: 0 10*3/uL (ref 0.0–0.1)
Basophils Relative: 0.6 % (ref 0.0–3.0)
Eosinophils Absolute: 0.2 10*3/uL (ref 0.0–0.7)
Eosinophils Relative: 4.2 % (ref 0.0–5.0)
HCT: 33.8 % — ABNORMAL LOW (ref 36.0–46.0)
Hemoglobin: 11 g/dL — ABNORMAL LOW (ref 12.0–15.0)
Lymphocytes Relative: 39.1 % (ref 12.0–46.0)
Lymphs Abs: 1.8 10*3/uL (ref 0.7–4.0)
MCHC: 32.4 g/dL (ref 30.0–36.0)
MCV: 90.3 fl (ref 78.0–100.0)
Monocytes Absolute: 0.6 10*3/uL (ref 0.1–1.0)
Monocytes Relative: 12.9 % — ABNORMAL HIGH (ref 3.0–12.0)
Neutro Abs: 2 10*3/uL (ref 1.4–7.7)
Neutrophils Relative %: 43.2 % (ref 43.0–77.0)
Platelets: 214 10*3/uL (ref 150.0–400.0)
RBC: 3.74 Mil/uL — ABNORMAL LOW (ref 3.87–5.11)
RDW: 13.6 % (ref 11.5–14.6)
WBC: 4.7 10*3/uL (ref 4.5–10.5)

## 2013-02-03 LAB — IBC PANEL
Iron: 59 ug/dL (ref 42–145)
Saturation Ratios: 18.2 % — ABNORMAL LOW (ref 20.0–50.0)
Transferrin: 232.1 mg/dL (ref 212.0–360.0)

## 2013-02-03 NOTE — Progress Notes (Signed)
Subjective:    Patient ID: [name], female    DOB: 1925-08-03, 79 y.o.   MRN: [id number]  HPI Pt states 1 week of intermittent slight "indigestion" in the chest.  She has assoc belching.  She says she has these sxs in the spring of each year.   She now takes fe tabs as rx'ed.   Past Medical History  Diagnosis Date  . Arrhythmia     paroxysmal atrial fibrillation  . Coronary artery disease   . Hypertension   . Hyperlipidemia   . Edema   . GERD (gastroesophageal reflux disease)   . Anemia B twelve deficiency   . Dizziness - giddy   . Abdominal pain   . Iron deficiency anemia, unspecified   . Pernicious anemia   . Thoracic or lumbosacral neuritis or radiculitis, unspecified   . Symptomatic menopausal or female climacteric states   . Lumbago   . Degeneration of lumbar or lumbosacral intervertebral disc   . Benign neoplasm of colon   . Diabetes mellitus without mention of complication   . Allergic rhinitis, cause unspecified     Past Surgical History  Procedure Laterality Date  . Abdominal hysterectomy  1974    and appendectomy  . Cholecystectomy    . Back surgery      History   Social History  . Marital Status: Widowed    Spouse Name: N/A    Number of Children: N/A  . Years of Education: N/A   Occupational History  . Not on file.   Social History Main Topics  . Smoking status: Never Smoker   . Smokeless tobacco: Not on file  . Alcohol Use: Not on file  . Drug Use: Not on file  . Sexually Active: Not on file   Other Topics Concern  . Not on file   Social History Narrative  . No narrative on file    Current Outpatient Prescriptions on File Prior to Visit  Medication Sig Dispense Refill  . aspirin 81 MG EC tablet Take 81 mg by mouth daily.        . bromocriptine (PARLODEL) 2.5 MG tablet Take 1 tablet (2.5 mg total) by mouth at bedtime.  30 tablet  11  . fluticasone (FLONASE) 50 MCG/ACT nasal spray Place 2 sprays into the nose daily.  16 g  6  .  iron polysaccharides (FERREX 150) 150 MG capsule Take 1 capsule (150 mg total) by mouth 2 (two) times daily.  60 capsule  5  . JANUVIA 100 MG tablet TAKE ONE TABLET BY MOUTH EVERY DAY  90 each  2  . lisinopril-hydrochlorothiazide (PRINZIDE,ZESTORETIC) 20-12.5 MG per tablet TAKE 1 TABLET BY MOUTH ONCE DAILY  30 tablet  5  . loratadine-pseudoephedrine (CLARITIN-D 24-HOUR) 10-240 MG per 24 hr tablet Take 1 tablet by mouth. 1 daily as needed for congestion       . metFORMIN (GLUCOPHAGE-XR) 500 MG 24 hr tablet TAKE ONE TABLET BY MOUTH TWICE DAILY  60 tablet  5  . metoprolol (TOPROL-XL) 50 MG 24 hr tablet Take 50 mg by mouth daily.       . metoprolol succinate (TOPROL-XL) 50 MG 24 hr tablet TAKE 1 TABLET EVERY DAY  30 tablet  10  . nitroGLYCERIN (NITROSTAT) 0.4 MG SL tablet Place 0.4 mg under the tongue every 5 (five) minutes as needed.        . Omeprazole ([facility] OMEPRAZOLE) 20 MG TBEC Take 1 tablet (20 mg total) by mouth daily.  30 each  11  . pravastatin (PRAVACHOL) 40 MG tablet TAKE TWO TABLETS BY MOUTH AT BEDTIME  60 tablet  11  . traMADol (ULTRAM) 50 MG tablet Take 1 tablet (50 mg total) by mouth every 8 (eight) hours as needed.  50 tablet  5   No current facility-administered medications on file prior to visit.    Allergies  Allergen Reactions  . Celecoxib     REACTION: rash  . Erythromycin     REACTION: itching  . Nitrofurantoin     REACTION: rash  . Penicillins     REACTION: swelling of joints  . Piroxicam   . Povidone     REACTION: itching  . Sulfonamide Derivatives     REACTION: "like my head was full of water"   No family history on file.  BP 126/70  Pulse 80  Wt 131 lb (59.421 kg)  BMI 23.95 kg/m2  SpO2 97%  Review of Systems Denies brbpr and diarrhea    Objective:   Physical Exam VITAL SIGNS:  See vs page GENERAL: no distress LUNGS:  Clear to auscultation HEART:  Regular rate and rhythm without murmurs noted. Normal S1,S2.   ABDOMEN: abdomen is soft, nontender.   no hepatosplenomegaly.  not distended.  no hernia.   Lab Results  Component Value Date   WBC 4.7 02/03/2013   HGB 11.0* 02/03/2013   HCT 33.8* 02/03/2013   MCV 90.3 02/03/2013   PLT 214.0 02/03/2013      Assessment & Plan:  Dyspeptic sxs, new, uncertain etiology fe-deficiency anemia, needs increased rx

## 2013-02-03 NOTE — Patient Instructions (Addendum)
Please increase the omeprazole to 2 pills per day.  I hope you feel better soon.  If you don't feel better by next week, please call back.   blood tests are being requested for you today.  We'll contact you with results. Please come back for a follow-up appointment in 3 months.

## 2013-02-09 ENCOUNTER — Other Ambulatory Visit: Payer: Self-pay

## 2013-02-09 MED ORDER — LISINOPRIL-HYDROCHLOROTHIAZIDE 20-12.5 MG PO TABS: ORAL_TABLET | ORAL | Status: DC

## 2013-02-09 MED ORDER — POLYSACCHARIDE IRON COMPLEX 150 MG PO CAPS: 150.0000 mg | ORAL_CAPSULE | Freq: Two times a day (BID) | ORAL | Status: DC

## 2013-02-09 NOTE — Telephone Encounter (Signed)
Pt called stating that her iron rx was changed to tid, is this correct? Did not see this on med list or in the notes,Is so pt needs correct rx [facility] to [facility] [address]?

## 2013-02-18 DIAGNOSIS — H40019 Open angle with borderline findings, low risk, unspecified eye: Secondary | ICD-10-CM | POA: Diagnosis not present

## 2013-02-18 DIAGNOSIS — H04129 Dry eye syndrome of unspecified lacrimal gland: Secondary | ICD-10-CM | POA: Diagnosis not present

## 2013-02-18 DIAGNOSIS — Z961 Presence of intraocular lens: Secondary | ICD-10-CM | POA: Diagnosis not present

## 2013-02-18 DIAGNOSIS — E119 Type 2 diabetes mellitus without complications: Secondary | ICD-10-CM | POA: Diagnosis not present

## 2013-02-18 DIAGNOSIS — E11319 Type 2 diabetes mellitus with unspecified diabetic retinopathy without macular edema: Secondary | ICD-10-CM | POA: Diagnosis not present

## 2013-02-24 ENCOUNTER — Other Ambulatory Visit: Payer: Self-pay | Admitting: Endocrinology

## 2013-03-04 DIAGNOSIS — R3989 Other symptoms and signs involving the genitourinary system: Secondary | ICD-10-CM | POA: Diagnosis not present

## 2013-03-08 ENCOUNTER — Encounter: Payer: Self-pay | Admitting: Endocrinology

## 2013-04-07 ENCOUNTER — Other Ambulatory Visit: Payer: Self-pay

## 2013-04-07 DIAGNOSIS — Z1231 Encounter for screening mammogram for malignant neoplasm of breast: Secondary | ICD-10-CM

## 2013-04-22 ENCOUNTER — Other Ambulatory Visit: Payer: Self-pay | Admitting: *Deleted

## 2013-04-22 MED ORDER — METFORMIN HCL ER 500 MG PO TB24: 500.0000 mg | ORAL_TABLET | Freq: Two times a day (BID) | ORAL | Status: DC

## 2013-04-22 MED ORDER — SITAGLIPTIN PHOSPHATE 100 MG PO TABS: 100.0000 mg | ORAL_TABLET | Freq: Every day | ORAL | Status: DC

## 2013-04-28 ENCOUNTER — Ambulatory Visit (INDEPENDENT_AMBULATORY_CARE_PROVIDER_SITE_OTHER): Payer: Medicare Other | Admitting: Endocrinology

## 2013-04-28 ENCOUNTER — Encounter: Payer: Self-pay | Admitting: Endocrinology

## 2013-04-28 VITALS — BP 130/78 | HR 78 | Ht 61.0 in | Wt 128.0 lb

## 2013-04-28 DIAGNOSIS — D509 Iron deficiency anemia, unspecified: Secondary | ICD-10-CM

## 2013-04-28 DIAGNOSIS — E119 Type 2 diabetes mellitus without complications: Secondary | ICD-10-CM | POA: Diagnosis not present

## 2013-04-28 LAB — CBC WITH DIFFERENTIAL/PLATELET
Basophils Absolute: 0 10*3/uL (ref 0.0–0.1)
Basophils Relative: 0.5 % (ref 0.0–3.0)
Eosinophils Absolute: 0.2 10*3/uL (ref 0.0–0.7)
Eosinophils Relative: 4.3 % (ref 0.0–5.0)
HCT: 34 % — ABNORMAL LOW (ref 36.0–46.0)
Hemoglobin: 11.3 g/dL — ABNORMAL LOW (ref 12.0–15.0)
Lymphocytes Relative: 37.7 % (ref 12.0–46.0)
Lymphs Abs: 1.7 10*3/uL (ref 0.7–4.0)
MCHC: 33.2 g/dL (ref 30.0–36.0)
MCV: 89.5 fl (ref 78.0–100.0)
Monocytes Absolute: 0.6 10*3/uL (ref 0.1–1.0)
Monocytes Relative: 13.9 % — ABNORMAL HIGH (ref 3.0–12.0)
Neutro Abs: 1.9 10*3/uL (ref 1.4–7.7)
Neutrophils Relative %: 43.6 % (ref 43.0–77.0)
Platelets: 229 10*3/uL (ref 150.0–400.0)
RBC: 3.8 Mil/uL — ABNORMAL LOW (ref 3.87–5.11)
RDW: 14.3 % (ref 11.5–14.6)
WBC: 4.4 10*3/uL — ABNORMAL LOW (ref 4.5–10.5)

## 2013-04-28 LAB — IBC PANEL
Iron: 54 ug/dL (ref 42–145)
Saturation Ratios: 16.1 % — ABNORMAL LOW (ref 20.0–50.0)
Transferrin: 238.9 mg/dL (ref 212.0–360.0)

## 2013-04-28 LAB — HEMOGLOBIN A1C: Hgb A1c MFr Bld: 7.3 % — ABNORMAL HIGH (ref 4.6–6.5)

## 2013-04-28 MED ORDER — DOXYCYCLINE HYCLATE 100 MG PO TABS: 100.0000 mg | ORAL_TABLET | Freq: Two times a day (BID) | ORAL | Status: DC

## 2013-04-28 NOTE — Progress Notes (Signed)
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   MRN: [id number]  HPI Pt states 5 days of moderate congestion in the nose, and assoc headache.  No cough. She takes fe 2/day. Past Medical History  Diagnosis Date  . Arrhythmia     paroxysmal atrial fibrillation  . Coronary artery disease   . Hypertension   . Hyperlipidemia   . Edema   . GERD (gastroesophageal reflux disease)   . Anemia B twelve deficiency   . Dizziness - giddy   . Abdominal pain   . Iron deficiency anemia, unspecified   . Pernicious anemia   . Thoracic or lumbosacral neuritis or radiculitis, unspecified   . Symptomatic menopausal or female climacteric states   . Lumbago   . Degeneration of lumbar or lumbosacral intervertebral disc   . Benign neoplasm of colon   . Diabetes mellitus without mention of complication   . Allergic rhinitis, cause unspecified     Past Surgical History  Procedure Laterality Date  . Abdominal hysterectomy  1974    and appendectomy  . Cholecystectomy    . Back surgery      History   Social History  . Marital Status: Widowed    Spouse Name: N/A    Number of Children: N/A  . Years of Education: N/A   Occupational History  . Not on file.   Social History Main Topics  . Smoking status: Never Smoker   . Smokeless tobacco: Not on file  . Alcohol Use: Not on file  . Drug Use: Not on file  . Sexually Active: Not on file   Other Topics Concern  . Not on file   Social History Narrative  . No narrative on file    Current Outpatient Prescriptions on File Prior to Visit  Medication Sig Dispense Refill  . aspirin 81 MG EC tablet Take 81 mg by mouth daily.        . bromocriptine (PARLODEL) 2.5 MG tablet Take 1 tablet (2.5 mg total) by mouth at bedtime.  30 tablet  11  . fluticasone (FLONASE) 50 MCG/ACT nasal spray Place 2 sprays into the nose daily.  16 g  6  . iron polysaccharides (FERREX 150) 150 MG capsule Take 1 capsule (150 mg total) by  mouth 2 (two) times daily.  60 capsule  5  . lisinopril-hydrochlorothiazide (PRINZIDE,ZESTORETIC) 20-12.5 MG per tablet TAKE 1 TABLET BY MOUTH ONCE DAILY  30 tablet  5  . loratadine-pseudoephedrine (CLARITIN-D 24-HOUR) 10-240 MG per 24 hr tablet Take 1 tablet by mouth. 1 daily as needed for congestion       . metFORMIN (GLUCOPHAGE-XR) 500 MG 24 hr tablet Take 1 tablet (500 mg total) by mouth 2 (two) times daily.  60 tablet  3  . metoprolol (TOPROL-XL) 50 MG 24 hr tablet Take 50 mg by mouth daily.       . nitroGLYCERIN (NITROSTAT) 0.4 MG SL tablet Place 0.4 mg under the tongue every 5 (five) minutes as needed.        . Omeprazole ([facility] OMEPRAZOLE) 20 MG TBEC Take 1 tablet (20 mg total) by mouth daily.  30 each  11  . pravastatin (PRAVACHOL) 40 MG tablet TAKE TWO TABLETS BY MOUTH AT BEDTIME  60 tablet  11  . sitaGLIPtin (JANUVIA) 100 MG tablet Take 1 tablet (100 mg total) by mouth daily.  90 tablet  1  . traMADol (ULTRAM) 50 MG tablet Take 1 tablet (50  mg total) by mouth every 8 (eight) hours as needed.  50 tablet  5   No current facility-administered medications on file prior to visit.    Allergies  Allergen Reactions  . Celecoxib     REACTION: rash  . Erythromycin     REACTION: itching  . Nitrofurantoin     REACTION: rash  . Penicillins     REACTION: swelling of joints  . Piroxicam   . Povidone     REACTION: itching  . Sulfonamide Derivatives     REACTION: "like my head was full of water"    No family history on file.  BP 130/78  Pulse 78  Ht 5\' 1"  (1.549 m)  Wt 128 lb (58.06 kg)  BMI 24.2 kg/m2  SpO2 98%  Review of Systems Denies earache and fever    Objective:   Physical Exam VITAL SIGNS:  See vs page GENERAL: no distress head: no deformity eyes: no periorbital swelling, no proptosis external nose and ears are normal mouth: no lesion seen Both eac's and tm's are normal LUNGS:  Clear to auscultation.  Lab Results  Component Value Date   WBC 4.4* 04/28/2013    HGB 11.3* 04/28/2013   HCT 34.0* 04/28/2013   MCV 89.5 04/28/2013   PLT 229.0 04/28/2013   Lab Results  Component Value Date   HGBA1C 7.3* 04/28/2013      Assessment & Plan:  [place] Peers, new Anemia, persistent DM: only slightly elevated a1c, so we can continue same rx.

## 2013-04-28 NOTE — Patient Instructions (Addendum)
i have sent a prescription to your pharmacy, for an antibiotic pill. Loratadine-d (non-prescription) will help your congestion. I hope you feel better soon.  If you don't feel better by next week, please call back.  Please call sooner if you get worse.  blood tests are being requested for you today.  We'll contact you with results.   Please come back for a "medicare wellness" appointment after 11/20/13.

## 2013-05-06 ENCOUNTER — Ambulatory Visit: Payer: Medicare Other | Admitting: Endocrinology

## 2013-05-10 ENCOUNTER — Other Ambulatory Visit: Payer: Self-pay | Admitting: Cardiology

## 2013-05-11 ENCOUNTER — Ambulatory Visit: Payer: Medicare Other

## 2013-05-11 ENCOUNTER — Other Ambulatory Visit: Payer: Self-pay | Admitting: Cardiology

## 2013-05-11 ENCOUNTER — Ambulatory Visit
Admission: RE | Admit: 2013-05-11 | Discharge: 2013-05-11 | Disposition: A | Discharge status: Home / Self Care | Payer: Medicare Other | Source: Ambulatory Visit

## 2013-05-11 DIAGNOSIS — Z1231 Encounter for screening mammogram for malignant neoplasm of breast: Secondary | ICD-10-CM | POA: Diagnosis not present

## 2013-06-29 ENCOUNTER — Emergency Department (HOSPITAL_COMMUNITY): Payer: Medicare Other

## 2013-06-29 ENCOUNTER — Observation Stay (HOSPITAL_COMMUNITY)
Admission: EM | Admit: 2013-06-29 | Discharge: 2013-06-30 | Disposition: A | Discharge status: Home / Self Care | Payer: Medicare Other | Attending: Internal Medicine | Admitting: Internal Medicine

## 2013-06-29 ENCOUNTER — Encounter (HOSPITAL_COMMUNITY): Payer: Self-pay | Admitting: Cardiology

## 2013-06-29 DIAGNOSIS — I4891 Unspecified atrial fibrillation: Secondary | ICD-10-CM | POA: Diagnosis present

## 2013-06-29 DIAGNOSIS — K829 Disease of gallbladder, unspecified: Secondary | ICD-10-CM

## 2013-06-29 DIAGNOSIS — E871 Hypo-osmolality and hyponatremia: Secondary | ICD-10-CM | POA: Diagnosis present

## 2013-06-29 DIAGNOSIS — I2510 Atherosclerotic heart disease of native coronary artery without angina pectoris: Secondary | ICD-10-CM | POA: Diagnosis present

## 2013-06-29 DIAGNOSIS — K5900 Constipation, unspecified: Secondary | ICD-10-CM

## 2013-06-29 DIAGNOSIS — M1990 Unspecified osteoarthritis, unspecified site: Secondary | ICD-10-CM

## 2013-06-29 DIAGNOSIS — D509 Iron deficiency anemia, unspecified: Secondary | ICD-10-CM

## 2013-06-29 DIAGNOSIS — D510 Vitamin B12 deficiency anemia due to intrinsic factor deficiency: Secondary | ICD-10-CM

## 2013-06-29 DIAGNOSIS — Z86010 Personal history of colonic polyps: Secondary | ICD-10-CM

## 2013-06-29 DIAGNOSIS — I10 Essential (primary) hypertension: Secondary | ICD-10-CM | POA: Diagnosis not present

## 2013-06-29 DIAGNOSIS — E872 Acidosis, unspecified: Secondary | ICD-10-CM

## 2013-06-29 DIAGNOSIS — R05 Cough: Secondary | ICD-10-CM

## 2013-06-29 DIAGNOSIS — N3000 Acute cystitis without hematuria: Secondary | ICD-10-CM

## 2013-06-29 DIAGNOSIS — R079 Chest pain, unspecified: Secondary | ICD-10-CM

## 2013-06-29 DIAGNOSIS — K219 Gastro-esophageal reflux disease without esophagitis: Secondary | ICD-10-CM

## 2013-06-29 DIAGNOSIS — Z79899 Other long term (current) drug therapy: Secondary | ICD-10-CM | POA: Diagnosis not present

## 2013-06-29 DIAGNOSIS — R55 Syncope and collapse: Secondary | ICD-10-CM

## 2013-06-29 DIAGNOSIS — N951 Menopausal and female climacteric states: Secondary | ICD-10-CM

## 2013-06-29 DIAGNOSIS — E119 Type 2 diabetes mellitus without complications: Secondary | ICD-10-CM | POA: Diagnosis not present

## 2013-06-29 DIAGNOSIS — E785 Hyperlipidemia, unspecified: Secondary | ICD-10-CM

## 2013-06-29 DIAGNOSIS — M129 Arthropathy, unspecified: Secondary | ICD-10-CM

## 2013-06-29 DIAGNOSIS — N390 Urinary tract infection, site not specified: Secondary | ICD-10-CM

## 2013-06-29 DIAGNOSIS — E8729 Other acidosis: Secondary | ICD-10-CM

## 2013-06-29 DIAGNOSIS — D126 Benign neoplasm of colon, unspecified: Secondary | ICD-10-CM

## 2013-06-29 DIAGNOSIS — R0789 Other chest pain: Principal | ICD-10-CM | POA: Diagnosis present

## 2013-06-29 DIAGNOSIS — R609 Edema, unspecified: Secondary | ICD-10-CM

## 2013-06-29 DIAGNOSIS — R002 Palpitations: Secondary | ICD-10-CM

## 2013-06-29 DIAGNOSIS — R059 Cough, unspecified: Secondary | ICD-10-CM

## 2013-06-29 DIAGNOSIS — D518 Other vitamin B12 deficiency anemias: Secondary | ICD-10-CM

## 2013-06-29 DIAGNOSIS — M545 Low back pain, unspecified: Secondary | ICD-10-CM

## 2013-06-29 DIAGNOSIS — M79609 Pain in unspecified limb: Secondary | ICD-10-CM

## 2013-06-29 DIAGNOSIS — M5137 Other intervertebral disc degeneration, lumbosacral region: Secondary | ICD-10-CM

## 2013-06-29 DIAGNOSIS — R109 Unspecified abdominal pain: Secondary | ICD-10-CM

## 2013-06-29 DIAGNOSIS — IMO0002 Reserved for concepts with insufficient information to code with codable children: Secondary | ICD-10-CM

## 2013-06-29 DIAGNOSIS — Z8719 Personal history of other diseases of the digestive system: Secondary | ICD-10-CM

## 2013-06-29 DIAGNOSIS — J309 Allergic rhinitis, unspecified: Secondary | ICD-10-CM

## 2013-06-29 DIAGNOSIS — R42 Dizziness and giddiness: Secondary | ICD-10-CM

## 2013-06-29 HISTORY — DX: Unspecified osteoarthritis, unspecified site: M19.90

## 2013-06-29 HISTORY — DX: Type 2 diabetes mellitus without complications: E11.9

## 2013-06-29 LAB — CBC
HCT: 33.3 % — ABNORMAL LOW (ref 36.0–46.0)
Hemoglobin: 11.5 g/dL — ABNORMAL LOW (ref 12.0–15.0)
MCH: 29.7 pg (ref 26.0–34.0)
MCHC: 34.5 g/dL (ref 30.0–36.0)
MCV: 86 fL (ref 78.0–100.0)
Platelets: 193 10*3/uL (ref 150–400)
RBC: 3.87 MIL/uL (ref 3.87–5.11)
RDW: 13.9 % (ref 11.5–15.5)
WBC: 5.8 10*3/uL (ref 4.0–10.5)

## 2013-06-29 LAB — LACTIC ACID, PLASMA: Lactic Acid, Venous: 0.9 mmol/L (ref 0.5–2.2)

## 2013-06-29 LAB — BASIC METABOLIC PANEL
BUN: 17 mg/dL (ref 6–23)
CO2: 15 mEq/L — ABNORMAL LOW (ref 19–32)
Calcium: 10.3 mg/dL (ref 8.4–10.5)
Chloride: 97 mEq/L (ref 96–112)
Creatinine, Ser: 0.72 mg/dL (ref 0.50–1.10)
GFR calc Af Amer: 88 mL/min — ABNORMAL LOW (ref >90)
GFR calc non Af Amer: 76 mL/min — ABNORMAL LOW (ref >90)
Glucose, Bld: 121 mg/dL — ABNORMAL HIGH (ref 70–99)
Potassium: 4.9 mEq/L (ref 3.5–5.1)
Sodium: 130 mEq/L — ABNORMAL LOW (ref 135–145)

## 2013-06-29 LAB — POCT I-STAT TROPONIN I: Troponin i, poc: 0.03 ng/mL (ref 0.00–0.08)

## 2013-06-29 NOTE — ED Notes (Signed)
Pt reports that she started having chest pain this afternoon. States that she thinks she has indigestion but is concerned and wants to be checked out. Denies any n/v or SOB. Skin warm and dry. Denies any cardiac hx.

## 2013-06-29 NOTE — ED Provider Notes (Signed)
CSN: [id number]     Arrival date & time 06/29/13  1831 History     First MD Initiated Contact with Patient 06/29/13 2107     Chief Complaint  Patient presents with  . Chest Pain    HPI Pt reports that she started having chest pain this afternoon. States that she thinks she has indigestion but is concerned and wants to be checked out. Denies any n/v or SOB. Skin warm and dry.  Patient has history of coronary disease with one stent in place for last 2 years.  Past Medical History  Diagnosis Date  . Arrhythmia     paroxysmal atrial fibrillation  . Coronary artery disease   . Hypertension   . Hyperlipidemia   . Edema   . GERD (gastroesophageal reflux disease)   . Anemia B twelve deficiency   . Dizziness - giddy   . Abdominal pain   . Iron deficiency anemia, unspecified   . Pernicious anemia   . Thoracic or lumbosacral neuritis or radiculitis, unspecified   . Symptomatic menopausal or female climacteric states   . Lumbago   . Degeneration of lumbar or lumbosacral intervertebral disc   . Benign neoplasm of colon   . Diabetes mellitus without mention of complication   . Allergic rhinitis, cause unspecified    Past Surgical History  Procedure Laterality Date  . Abdominal hysterectomy  1974    and appendectomy  . Cholecystectomy    . Back surgery     History reviewed. No pertinent family history. History  Substance Use Topics  . Smoking status: Never Smoker   . Smokeless tobacco: Not on file  . Alcohol Use: Not on file   OB History   Grav Para Term Preterm Abortions TAB SAB Ect Mult Living                 Review of Systems All other systems reviewed and are negative Allergies  Celecoxib; Erythromycin; Nitrofurantoin; Penicillins; Piroxicam; Povidone; and Sulfonamide derivatives  Home Medications   Current Outpatient Rx  Name  Route  Sig  Dispense  Refill  . aspirin 81 MG EC tablet   Oral   Take 81 mg by mouth daily.           . bromocriptine (PARLODEL) 2.5  MG tablet   Oral   Take 1 tablet (2.5 mg total) by mouth at bedtime.   30 tablet   11     PT SAYS SHE IS NOW TAKING 1 TABLET DAILY. PLEASE S ...   . calcium carbonate (TUMS - DOSED IN MG ELEMENTAL CALCIUM) 500 MG chewable tablet   Oral   Chew 1 tablet by mouth as needed for heartburn.         . iron polysaccharides (FERREX 150) 150 MG capsule   Oral   Take 1 capsule (150 mg total) by mouth 2 (two) times daily.   60 capsule   5   . lisinopril-hydrochlorothiazide (PRINZIDE,ZESTORETIC) 20-12.5 MG per tablet   Oral   Take 1 tablet by mouth daily.         [name] loratadine-pseudoephedrine (CLARITIN-D 24-HOUR) 10-240 MG per 24 hr tablet   Oral   Take 1 tablet by mouth. 1 daily as needed for congestion          . metFORMIN (GLUCOPHAGE-XR) 500 MG 24 hr tablet   Oral   Take 1 tablet (500 mg total) by mouth 2 (two) times daily.   60 tablet   3   .  metoprolol succinate (TOPROL-XL) 50 MG 24 hr tablet   Oral   Take 50 mg by mouth daily. Take with or immediately following a meal.         . nitroGLYCERIN (NITROSTAT) 0.4 MG SL tablet   Sublingual   Place 0.4 mg under the tongue every 5 (five) minutes as needed.           . pravastatin (PRAVACHOL) 40 MG tablet   Oral   Take 40 mg by mouth at bedtime.         . sitaGLIPtin (JANUVIA) 100 MG tablet   Oral   Take 1 tablet (100 mg total) by mouth daily.   90 tablet   1    BP 167/53  Pulse 59  Temp(Src) 97.6 F (36.4 C) (Oral)  Resp 13  SpO2 100% Physical Exam  Nursing note and vitals reviewed. Constitutional: She is oriented to person, place, and time. She appears well-developed and well-nourished. No distress.  HENT:  Head: Normocephalic and atraumatic.  Eyes: Pupils are equal, round, and reactive to light.  Neck: Normal range of motion.  Cardiovascular: Normal rate, normal heart sounds and intact distal pulses.  Exam reveals no gallop and no friction rub.   Pulmonary/Chest: No respiratory distress.  Abdominal:  Normal appearance. She exhibits no distension.  Musculoskeletal: Normal range of motion.  Neurological: She is alert and oriented to person, place, and time. No cranial nerve deficit.  Skin: Skin is warm and dry. No rash noted.  Psychiatric: She has a normal mood and affect. Her behavior is normal.    ED Course   Date: 06/29/2013  Rate: 65  Rhythm: normal sinus rhythm  QRS Axis: normal  Intervals: normal  ST/T Wave abnormalities: Left ventricular hypertrophy  Conduction Disutrbances: none  Narrative Interpretation: Abnormal EKG     Procedures (including critical care time)  Labs Reviewed  CBC - Abnormal; Notable for the following:    Hemoglobin 11.5 (*)    HCT 33.3 (*)    All other components within normal limits  BASIC METABOLIC PANEL - Abnormal; Notable for the following:    Sodium 130 (*)    CO2 15 (*)    Glucose, Bld 121 (*)    GFR calc non Af Amer 76 (*)    GFR calc Af Amer 88 (*)    All other components within normal limits  LACTIC ACID, PLASMA  POCT I-STAT TROPONIN I   Dg Chest 2 View  06/29/2013   *RADIOLOGY REPORT*  Clinical Data: Chest pain and epigastric pain.  CHEST - 2 VIEW  Comparison: 03/23/2010  Findings: Heart size and pulmonary vascularity are normal and the lungs are clear.  Chronic elevation of the right hemidiaphragm.  No acute osseous abnormality.  Flowing osteophytes fuse much of the thoracic spine.  Coronary artery stent is noted.  IMPRESSION: No acute abnormalities.   Original Report Authenticated By: Francene Boyers, M.D.   1. Chest pain   2. Metabolic acidosis, increased anion gap (IAG)     MDM    Nelia Shi, MD 06/29/13 2351

## 2013-06-30 ENCOUNTER — Encounter (HOSPITAL_COMMUNITY): Payer: Self-pay | Admitting: General Practice

## 2013-06-30 DIAGNOSIS — D509 Iron deficiency anemia, unspecified: Secondary | ICD-10-CM | POA: Diagnosis not present

## 2013-06-30 DIAGNOSIS — E119 Type 2 diabetes mellitus without complications: Secondary | ICD-10-CM

## 2013-06-30 DIAGNOSIS — R0789 Other chest pain: Secondary | ICD-10-CM | POA: Diagnosis not present

## 2013-06-30 DIAGNOSIS — K219 Gastro-esophageal reflux disease without esophagitis: Secondary | ICD-10-CM

## 2013-06-30 DIAGNOSIS — R079 Chest pain, unspecified: Secondary | ICD-10-CM | POA: Diagnosis not present

## 2013-06-30 DIAGNOSIS — I2510 Atherosclerotic heart disease of native coronary artery without angina pectoris: Secondary | ICD-10-CM

## 2013-06-30 LAB — SALICYLATE LEVEL: Salicylate Lvl: <2 mg/dL — ABNORMAL LOW (ref 2.8–20.0)

## 2013-06-30 LAB — KETONES, QUALITATIVE: Acetone, Bld: NEGATIVE

## 2013-06-30 LAB — GLUCOSE, CAPILLARY
Glucose-Capillary: 103 mg/dL — ABNORMAL HIGH (ref 70–99)
Glucose-Capillary: 112 mg/dL — ABNORMAL HIGH (ref 70–99)
Glucose-Capillary: 160 mg/dL — ABNORMAL HIGH (ref 70–99)

## 2013-06-30 LAB — HEPATIC FUNCTION PANEL
ALT: 12 U/L (ref 0–35)
AST: 20 U/L (ref 0–37)
Albumin: 3.4 g/dL — ABNORMAL LOW (ref 3.5–5.2)
Alkaline Phosphatase: 57 U/L (ref 39–117)
Bilirubin, Direct: 0.1 mg/dL (ref 0.0–0.3)
Indirect Bilirubin: 0.6 mg/dL (ref 0.3–0.9)
Total Bilirubin: 0.7 mg/dL (ref 0.3–1.2)
Total Protein: 5.7 g/dL — ABNORMAL LOW (ref 6.0–8.3)

## 2013-06-30 LAB — BASIC METABOLIC PANEL
BUN: 19 mg/dL (ref 6–23)
CO2: 24 mEq/L (ref 19–32)
Calcium: 9.2 mg/dL (ref 8.4–10.5)
Chloride: 98 mEq/L (ref 96–112)
Creatinine, Ser: 0.75 mg/dL (ref 0.50–1.10)
GFR calc Af Amer: 86 mL/min — ABNORMAL LOW (ref >90)
GFR calc non Af Amer: 75 mL/min — ABNORMAL LOW (ref >90)
Glucose, Bld: 87 mg/dL (ref 70–99)
Potassium: 3.8 mEq/L (ref 3.5–5.1)
Sodium: 133 mEq/L — ABNORMAL LOW (ref 135–145)

## 2013-06-30 LAB — TSH: TSH: 4.344 u[IU]/mL (ref 0.350–4.500)

## 2013-06-30 LAB — CREATININE, SERUM
Creatinine, Ser: 0.87 mg/dL (ref 0.50–1.10)
GFR calc Af Amer: 68 mL/min — ABNORMAL LOW (ref >90)
GFR calc non Af Amer: 59 mL/min — ABNORMAL LOW (ref >90)

## 2013-06-30 LAB — CBC
HCT: 28.4 % — ABNORMAL LOW (ref 36.0–46.0)
Hemoglobin: 10 g/dL — ABNORMAL LOW (ref 12.0–15.0)
MCH: 30 pg (ref 26.0–34.0)
MCHC: 35.2 g/dL (ref 30.0–36.0)
MCV: 85.3 fL (ref 78.0–100.0)
Platelets: 187 10*3/uL (ref 150–400)
RBC: 3.33 MIL/uL — ABNORMAL LOW (ref 3.87–5.11)
RDW: 13.4 % (ref 11.5–15.5)
WBC: 4.6 10*3/uL (ref 4.0–10.5)

## 2013-06-30 LAB — TROPONIN I
Troponin I: <0.3 ng/mL (ref <0.30)
Troponin I: <0.3 ng/mL (ref <0.30)
Troponin I: <0.3 ng/mL (ref <0.30)

## 2013-06-30 LAB — OSMOLALITY: Osmolality: 313 mOsm/kg — ABNORMAL HIGH (ref 275–300)

## 2013-06-30 MED ORDER — LISINOPRIL-HYDROCHLOROTHIAZIDE 20-12.5 MG PO TABS: 1.0000 | ORAL_TABLET | Freq: Every day | ORAL | Status: DC

## 2013-06-30 MED ORDER — LISINOPRIL 20 MG PO TABS
20.0000 mg | ORAL_TABLET | Freq: Every day | ORAL | Status: DC
  Administered 2013-06-30: 20 mg via ORAL
  Filled 2013-06-30: qty 1

## 2013-06-30 MED ORDER — METOPROLOL SUCCINATE ER 50 MG PO TB24
50.0000 mg | ORAL_TABLET | Freq: Every day | ORAL | Status: DC
  Administered 2013-06-30: 50 mg via ORAL
  Filled 2013-06-30: qty 1

## 2013-06-30 MED ORDER — SODIUM CHLORIDE 0.9 % IJ SOLN
3.0000 mL | Freq: Two times a day (BID) | INTRAMUSCULAR | Status: DC
  Administered 2013-06-30: 3 mL via INTRAVENOUS

## 2013-06-30 MED ORDER — ONDANSETRON HCL 4 MG PO TABS: 4.0000 mg | ORAL_TABLET | Freq: Four times a day (QID) | ORAL | Status: DC | PRN

## 2013-06-30 MED ORDER — LINAGLIPTIN 5 MG PO TABS
5.0000 mg | ORAL_TABLET | Freq: Every day | ORAL | Status: DC
  Administered 2013-06-30: 5 mg via ORAL
  Filled 2013-06-30: qty 1

## 2013-06-30 MED ORDER — SIMVASTATIN 20 MG PO TABS
20.0000 mg | ORAL_TABLET | Freq: Every day | ORAL | Status: DC
  Filled 2013-06-30: qty 1

## 2013-06-30 MED ORDER — ONDANSETRON HCL 4 MG/2ML IJ SOLN: 4.0000 mg | Freq: Four times a day (QID) | INTRAMUSCULAR | Status: DC | PRN

## 2013-06-30 MED ORDER — GLIPIZIDE 2.5 MG HALF TABLET
2.5000 mg | ORAL_TABLET | Freq: Every day | ORAL | Status: DC
  Administered 2013-06-30: 2.5 mg via ORAL
  Filled 2013-06-30 (×2): qty 1

## 2013-06-30 MED ORDER — BROMOCRIPTINE MESYLATE 2.5 MG PO TABS
2.5000 mg | ORAL_TABLET | Freq: Every day | ORAL | Status: DC
Start: 2013-06-30 — End: 2013-06-30
  Administered 2013-06-30: 2.5 mg via ORAL
  Filled 2013-06-30 (×2): qty 1

## 2013-06-30 MED ORDER — HEPARIN SODIUM (PORCINE) 5000 UNIT/ML IJ SOLN
5000.0000 [IU] | Freq: Three times a day (TID) | INTRAMUSCULAR | Status: DC
  Administered 2013-06-30: 5000 [IU] via SUBCUTANEOUS
  Filled 2013-06-30 (×4): qty 1

## 2013-06-30 MED ORDER — HYDROCHLOROTHIAZIDE 12.5 MG PO CAPS
12.5000 mg | ORAL_CAPSULE | Freq: Every day | ORAL | Status: DC
  Administered 2013-06-30: 12.5 mg via ORAL
  Filled 2013-06-30: qty 1

## 2013-06-30 MED ORDER — SODIUM CHLORIDE 0.9 % IV SOLN
INTRAVENOUS | Status: DC
  Administered 2013-06-30: 01:00:00 via INTRAVENOUS

## 2013-06-30 NOTE — Discharge Summary (Signed)
Physician Discharge Summary  [name] ZOX:[id number] DOB: [birth year] DOA: 06/29/2013  PCP: Romero Belling, MD  Admit date: 06/29/2013 Discharge date: 06/30/2013  Recommendations for Outpatient Follow-up:  1. Pt will need to follow up with PCP in 2 weeks post discharge 2. Please obtain BMP to evaluate electrolytes and kidney function 3. Please also check CBC to evaluate Hg and Hct levels   Discharge Diagnoses:  Active Problems:   DIABETES MELLITUS, TYPE II   HYPONATREMIA   HYPERTENSION   CAD, NATIVE VESSEL   PAROXYSMAL ATRIAL FIBRILLATION   Metabolic acidosis, increased anion gap (IAG)   Atypical chest pain  atypical chest pain -Likely related to GERD -Patient instructed to take Prilosec once daily -Troponins negative -EKG without any acute ST to T wave changes -Chest discomfort resolved during the hospitalization -Continue aspirin Anion gapped metabolic acidosis -AG-18 at time of admission -Likely due to metformin -Metformin  Discontinued -Patient was instructed to follow up with her primary care physician for further adjustment of her antidiabetic regimen in the future -She should continue Januvia for now -Hemoglobin A1c on 04/28/2013--7.3 -Salicylate level negative, acetone negative -Improved with IV fluids and discontinuation of metformin -bicarb improved to 25 on day of discharge Coronary artery disease -Stable -Continue aspirin, metoprolol succinate, Zocor Paroxysmal atrial fibrillation -Patient remained in sinus rhythm. Hyponatremia -Improved with IV fluids -Likely due to the patient's HCTZ and volume depletion Discharge Condition: stable  Disposition: home  Diet: heart healthy Wt Readings from Last 3 Encounters:  06/30/13 58.8 kg (129 lb 10.1 oz)  04/28/13 58.06 kg (128 lb)  02/03/13 59.421 kg (131 lb)    History of present illness:   79 y.o. female with hx of known CAD s/p cardiac stent placement by Dr Valera Castle a few years ago, hx of DM2 on  Metformin (previously on Actos), HTN, GERD, Hx of B12 deficiency, presents to the ER with substernal chest pain. She said it felt burning, and she had frequent burping. It wasn't like her previous anginal pain. She denied shortness of breath, nausea or vomiting. Evaluation in the ER included an EKG which didn't show any acute ST-T changes, and negative troponin. She was found however to have an increased AG metabolic acidosis with a bicarb of 15. She denied alcohol use, excessive ASA use, and no other complaints. Hospitalist was asked to admit her for atypical chest pain and to evaluate the reason for her increased anion gap metabolic acidosis.     Discharge Exam: Filed Vitals:   06/30/13 1358  BP: 148/71  Pulse: 70  Temp: 98.6 F (37 C)  Resp: 15   Filed Vitals:   06/30/13 0606 06/30/13 1019 06/30/13 1020 06/30/13 1358  BP: 147/73 139/60  148/71  Pulse: 64  72 70  Temp: 98.3 F (36.8 C)   98.6 F (37 C)  TempSrc: Oral   Oral  Resp: 18   15  Height:      Weight:      SpO2: 99%   99%   General: A&O x 3, NAD, pleasant, cooperative Cardiovascular: RRR, no rub, no gallop, no S3 Respiratory: CTAB, no wheeze, no rhonchi Abdomen:soft, nontender, nondistended, positive bowel sounds Extremities: No edema, No lymphangitis, no petechiae  Discharge Instructions  Discharge Orders   Future Appointments Provider Department Dept Phone   08/13/2013 9:30 AM Kathleene Hazel, MD [facility] [place]) [phone]   Future Orders Complete By Expires   Diet - low sodium heart healthy  As directed    Discharge  instructions  As directed    Comments:     Stop taking metformin until you follow up with her primary care physician who may adjust your future diabetic medications   Increase activity slowly  As directed        Medication List    STOP taking these medications       iron polysaccharides 150 MG capsule  Commonly known as:  FERREX 150     metFORMIN 500 MG 24  hr tablet  Commonly known as:  GLUCOPHAGE-XR      TAKE these medications       aspirin 81 MG EC tablet  Take 81 mg by mouth daily.     bromocriptine 2.5 MG tablet  Commonly known as:  PARLODEL  Take 1 tablet (2.5 mg total) by mouth at bedtime.     calcium carbonate 500 MG chewable tablet  Commonly known as:  TUMS - dosed in mg elemental calcium  Chew 1 tablet by mouth as needed for heartburn.     lisinopril-hydrochlorothiazide 20-12.5 MG per tablet  Commonly known as:  PRINZIDE,ZESTORETIC  Take 1 tablet by mouth daily.     loratadine-pseudoephedrine 10-240 MG per 24 hr tablet  Commonly known as:  CLARITIN-D 24-hour  Take 1 tablet by mouth. 1 daily as needed for congestion     metoprolol succinate 50 MG 24 hr tablet  Commonly known as:  TOPROL-XL  Take 50 mg by mouth daily. Take with or immediately following a meal.     nitroGLYCERIN 0.4 MG SL tablet  Commonly known as:  NITROSTAT  Place 0.4 mg under the tongue every 5 (five) minutes as needed.     pravastatin 40 MG tablet  Commonly known as:  PRAVACHOL  Take 40 mg by mouth at bedtime.     sitaGLIPtin 100 MG tablet  Commonly known as:  JANUVIA  Take 1 tablet (100 mg total) by mouth daily.         The results of significant diagnostics from this hospitalization (including imaging, microbiology, ancillary and laboratory) are listed below for reference.    Significant Diagnostic Studies: Dg Chest 2 View  06/29/2013   *RADIOLOGY REPORT*  Clinical Data: Chest pain and epigastric pain.  CHEST - 2 VIEW  Comparison: 03/23/2010  Findings: Heart size and pulmonary vascularity are normal and the lungs are clear.  Chronic elevation of the right hemidiaphragm.  No acute osseous abnormality.  Flowing osteophytes fuse much of the thoracic spine.  Coronary artery stent is noted.  IMPRESSION: No acute abnormalities.   Original Report Authenticated By: Francene Boyers, M.D.     Microbiology: No results found for this or any previous  visit (from the past 240 hour(s)).   Labs: Basic Metabolic Panel:  Recent Labs Lab 06/29/13 1900 06/30/13 0230 06/30/13 1424  NA 130*  --  133*  K 4.9  --  3.8  CL 97  --  98  CO2 15*  --  24  GLUCOSE 121*  --  87  BUN 17  --  19  CREATININE 0.72 0.87 0.75  CALCIUM 10.3  --  9.2   Liver Function Tests:  Recent Labs Lab 06/30/13 0435  AST 20  ALT 12  ALKPHOS 57  BILITOT 0.7  PROT 5.7*  ALBUMIN 3.4*   No results found for this basename: LIPASE, AMYLASE,  in the last 168 hours No results found for this basename: AMMONIA,  in the last 168 hours CBC:  Recent Labs Lab 06/29/13 1900 06/30/13  0230  WBC 5.8 4.6  HGB 11.5* 10.0*  HCT 33.3* 28.4*  MCV 86.0 85.3  PLT 193 187   Cardiac Enzymes:  Recent Labs Lab 06/30/13 0230 06/30/13 0815 06/30/13 1424  TROPONINI <0.30 <0.30 <0.30   BNP: No components found with this basename: POCBNP,  CBG:  Recent Labs Lab 06/30/13 0036 06/30/13 0731 06/30/13 1157  GLUCAP 160* 103* 112*    Time coordinating discharge:  Greater than 30 minutes  Signed:  Teghan Philbin, DO [facility] Pager: [phone] 06/30/2013, 4:02 PM

## 2013-06-30 NOTE — H&P (Signed)
[facility] History and Physical  [name] ZOX:[id number] DOB: Aug 03, 1925    PCP:   Romero Belling, MD   Chief Complaint: substernal chest pain and found to have AG 18 metabolic acidosis.  HPI: [employer] operational [employer] is an 79 y.o. female with hx of known CAD s/p cardiac stent placement by Dr Valera Castle a few years ago, hx of DM2 on Metformin (previously on Actos), HTN, GERD, Hx of B12 deficiency, presents to the ER with substernal chest pain.  She said it felt burning, and she had frequent burping.  It wasn't like her previous anginal pain.  She denied shortness of breath, nausea or vomiting.  Evaluation in the ER included an EKG which didn't show any acute ST_T changes, and negative troponin.  She was found however to have an increased AG metabolic acidosis with a bicarb of 15.  She denied alcohol use, excessive ASA use, and no other complaints.  Hospitalist was asked to admit her for atypical chest pain and to evaluate the reason for her increased anion gap metabolic acidosis.    Rewiew of Systems:  Constitutional: Negative for malaise, fever and chills. No significant weight loss or weight gain Eyes: Negative for eye pain, redness and discharge, diplopia, visual changes, or flashes of light. ENMT: Negative for ear pain, hoarseness, nasal congestion, sinus pressure and sore throat. No headaches; tinnitus, drooling, or problem swallowing. Cardiovascular: Negative for palpitations, diaphoresis, dyspnea and peripheral edema. ; No orthopnea, PND Respiratory: Negative for cough, hemoptysis, wheezing and stridor. No pleuritic chestpain. Gastrointestinal: Negative for nausea, vomiting, diarrhea, constipation, abdominal pain, melena, blood in stool, hematemesis, jaundice and rectal bleeding.    Genitourinary: Negative for frequency, dysuria, incontinence,flank pain and hematuria; Musculoskeletal: Negative for back pain and neck pain. Negative for swelling and trauma.;  Skin: . Negative for  pruritus, rash, abrasions, bruising and skin lesion.; ulcerations Neuro: Negative for headache, lightheadedness and neck stiffness. Negative for weakness, altered level of consciousness , altered mental status, extremity weakness, burning feet, involuntary movement, seizure and syncope.  Psych: negative for anxiety, depression, insomnia, tearfulness, panic attacks, hallucinations, paranoia, suicidal or homicidal ideation    Past Medical History  Diagnosis Date  . Arrhythmia     paroxysmal atrial fibrillation  . Coronary artery disease   . Hypertension   . Hyperlipidemia   . Edema   . GERD (gastroesophageal reflux disease)   . Anemia B twelve deficiency   . Dizziness - giddy   . Abdominal pain   . Iron deficiency anemia, unspecified   . Pernicious anemia   . Thoracic or lumbosacral neuritis or radiculitis, unspecified   . Symptomatic menopausal or female climacteric states   . Lumbago   . Benign neoplasm of colon   . Allergic rhinitis, cause unspecified   . Type II diabetes mellitus   . Degeneration of lumbar or lumbosacral intervertebral disc   . Arthritis     "arms and hands" (06/30/2013)    Past Surgical History  Procedure Laterality Date  . Back surgery    . Cholecystectomy    . Appendectomy  1974  . Abdominal hysterectomy  1974  . Carpal tunnel release Bilateral 1970's  . Cataract extraction, bilateral Bilateral   . Cardiac catheterization    . Coronary angioplasty with stent placement      "1" (06/30/2013  . Lumbar disc surgery      Medications:  HOME MEDS: Prior to Admission medications   Medication Sig Start Date End Date Taking? Authorizing Provider  aspirin 81 MG  EC tablet Take 81 mg by mouth daily.     Yes Historical Provider, MD  bromocriptine (PARLODEL) 2.5 MG tablet Take 1 tablet (2.5 mg total) by mouth at bedtime. 08/21/12  Yes Romero Belling, MD  calcium carbonate (TUMS - DOSED IN MG ELEMENTAL CALCIUM) 500 MG chewable tablet Chew 1 tablet by mouth as  needed for heartburn.   Yes Historical Provider, MD  iron polysaccharides (FERREX 150) 150 MG capsule Take 1 capsule (150 mg total) by mouth 2 (two) times daily. 02/09/13  Yes Romero Belling, MD  lisinopril-hydrochlorothiazide (PRINZIDE,ZESTORETIC) 20-12.5 MG per tablet Take 1 tablet by mouth daily.   Yes Historical Provider, MD  loratadine-pseudoephedrine (CLARITIN-D 24-HOUR) 10-240 MG per 24 hr tablet Take 1 tablet by mouth. 1 daily as needed for congestion    Yes Historical Provider, MD  metFORMIN (GLUCOPHAGE-XR) 500 MG 24 hr tablet Take 1 tablet (500 mg total) by mouth 2 (two) times daily. 04/22/13  Yes Romero Belling, MD  metoprolol succinate (TOPROL-XL) 50 MG 24 hr tablet Take 50 mg by mouth daily. Take with or immediately following a meal.   Yes Historical Provider, MD  nitroGLYCERIN (NITROSTAT) 0.4 MG SL tablet Place 0.4 mg under the tongue every 5 (five) minutes as needed.     Yes Historical Provider, MD  pravastatin (PRAVACHOL) 40 MG tablet Take 40 mg by mouth at bedtime.   Yes Historical Provider, MD  sitaGLIPtin (JANUVIA) 100 MG tablet Take 1 tablet (100 mg total) by mouth daily. 04/22/13  Yes Romero Belling, MD     Allergies:  Allergies  Allergen Reactions  . Celecoxib     REACTION: rash  . Erythromycin     REACTION: itching  . Nitrofurantoin     REACTION: rash  . Penicillins     REACTION: swelling of joints  . Piroxicam Other (See Comments)    unknown  . Povidone     REACTION: itching  . Sulfonamide Derivatives     REACTION: "like my head was full of water"    Social History:   reports that she has never smoked. Her smokeless tobacco use includes Snuff. She reports that she does not drink alcohol or use illicit drugs.  Family History: History reviewed. No pertinent family history.   Physical Exam: Filed Vitals:   06/29/13 1845 06/29/13 2115 06/29/13 2330 06/30/13 0021  BP: 187/60 178/89 167/53 166/70  Pulse: 66 60 59 60  Temp: 97.6 F (36.4 C)   97.9 F (36.6 C)   TempSrc: Oral   Oral  Resp: 16 14 13 16   Height:    5' (1.524 m)  Weight:    58.8 kg (129 lb 10.1 oz)  SpO2: 100% 100% 100% 100%   Blood pressure 166/70, pulse 60, temperature 97.9 F (36.6 C), temperature source Oral, resp. rate 16, height 5' (1.524 m), weight 58.8 kg (129 lb 10.1 oz), SpO2 100.00%.  GEN:  Pleasant  patient lying in the stretcher in no acute distress; cooperative with exam. PSYCH:  alert and oriented x4; does not appear anxious or depressed; affect is appropriate. HEENT: Mucous membranes pink and anicteric; PERRLA; EOM intact; no cervical lymphadenopathy nor thyromegaly or carotid bruit; no JVD; There were no stridor. Neck is very supple. Breasts:: Not examined CHEST WALL: No tenderness CHEST: Normal respiration, clear to auscultation bilaterally.  HEART: Regular rate and rhythm.  There are no murmur, rub, or gallops.   BACK: No kyphosis or scoliosis; no CVA tenderness ABDOMEN: soft and non-tender; no masses, no organomegaly, normal  abdominal bowel sounds; no pannus; no intertriginous candida. There is no rebound and no distention. Rectal Exam: Not done EXTREMITIES: No bone or joint deformity; age-appropriate arthropathy of the hands and knees; no edema; no ulcerations.  There is no calf tenderness. Genitalia: not examined PULSES: 2+ and symmetric SKIN: Normal hydration no rash or ulceration CNS: Cranial nerves 2-12 grossly intact no focal lateralizing neurologic deficit.  Speech is fluent; uvula elevated with phonation, facial symmetry and tongue midline. DTR are normal bilaterally, cerebella exam is intact, barbinski is negative and strengths are equaled bilaterally.  No sensory loss.   Labs on Admission:  Basic Metabolic Panel:  Recent Labs Lab 06/29/13 1900  NA 130*  K 4.9  CL 97  CO2 15*  GLUCOSE 121*  BUN 17  CREATININE 0.72  CALCIUM 10.3   Liver Function Tests: No results found for this basename: AST, ALT, ALKPHOS, BILITOT, PROT, ALBUMIN,  in the  last 168 hours No results found for this basename: LIPASE, AMYLASE,  in the last 168 hours No results found for this basename: AMMONIA,  in the last 168 hours CBC:  Recent Labs Lab 06/29/13 1900 06/30/13 0230  WBC 5.8 4.6  HGB 11.5* 10.0*  HCT 33.3* 28.4*  MCV 86.0 85.3  PLT 193 187   Cardiac Enzymes: No results found for this basename: CKTOTAL, CKMB, CKMBINDEX, TROPONINI,  in the last 168 hours  CBG:  Recent Labs Lab 06/30/13 0036  GLUCAP 160*     Radiological Exams on Admission: Dg Chest 2 View  06/29/2013   *RADIOLOGY REPORT*  Clinical Data: Chest pain and epigastric pain.  CHEST - 2 VIEW  Comparison: 03/23/2010  Findings: Heart size and pulmonary vascularity are normal and the lungs are clear.  Chronic elevation of the right hemidiaphragm.  No acute osseous abnormality.  Flowing osteophytes fuse much of the thoracic spine.  Coronary artery stent is noted.  IMPRESSION: No acute abnormalities.   Original Report Authenticated By: Francene Boyers, M.D.    Assessment/Plan Present on Admission:  . Atypical chest pain . CAD, NATIVE VESSEL . DIABETES MELLITUS, TYPE II . HYPERTENSION . HYPONATREMIA . PAROXYSMAL ATRIAL FIBRILLATION  PLAN:  Although I have checked acetone, lactic acid, ASA level, the most likely cause of her increase metabolic acidosis is because of Metformin.   Not only that, I noted she had hx of B12 deficiency, and metformin can also cause B12 deficiency as well.  Will D/C metformin.  I will use SSI, and change her to glucotrol at 2.5mg  /day.  With respect to her atypical chest pain, will admit her for r/out and obtain ECHO, but I think it is GI that is the culprit. With respect to her PAF, she is not a candidate for anticoagulation according to Dr Vern Claude office note, because of her fall risk.  I have continued her on her home meds, and will admit her to telemetry under [facility] service.  She is stable, full code, and will be admitted to [facility] service. Thank you for  allowing me to participate in her care.   Other plans as per orders.  Code Status: FULL Unk Lightning, MD. [facility] Pager [phone] 7pm to 7am.  06/30/2013, 4:25 AM

## 2013-06-30 NOTE — Progress Notes (Signed)
Pt was being discharged when I arrived. She was walking around and excited about leaving. Pt's daughters were bedside. Had prayer w/pt and family. Pt and family thankful for visit. Marjory Lies Chaplain  06/30/13 1600  Clinical Encounter Type  Visited With Patient and family together

## 2013-07-06 DIAGNOSIS — H40029 Open angle with borderline findings, high risk, unspecified eye: Secondary | ICD-10-CM | POA: Diagnosis not present

## 2013-07-06 DIAGNOSIS — H02839 Dermatochalasis of unspecified eye, unspecified eyelid: Secondary | ICD-10-CM | POA: Diagnosis not present

## 2013-07-13 ENCOUNTER — Ambulatory Visit (INDEPENDENT_AMBULATORY_CARE_PROVIDER_SITE_OTHER): Payer: Medicare Other | Admitting: Endocrinology

## 2013-07-13 ENCOUNTER — Encounter: Payer: Self-pay | Admitting: Endocrinology

## 2013-07-13 VITALS — BP 142/74 | Ht 62.0 in | Wt 131.0 lb

## 2013-07-13 DIAGNOSIS — D509 Iron deficiency anemia, unspecified: Secondary | ICD-10-CM | POA: Diagnosis not present

## 2013-07-13 DIAGNOSIS — E871 Hypo-osmolality and hyponatremia: Secondary | ICD-10-CM | POA: Diagnosis not present

## 2013-07-13 MED ORDER — OMEPRAZOLE 40 MG PO CPDR: 40.0000 mg | DELAYED_RELEASE_CAPSULE | Freq: Every day | ORAL | Status: DC

## 2013-07-13 NOTE — Patient Instructions (Addendum)
i have sent a prescription to your pharmacy, for the heartburn. After you have been on this for a few days, try resuming the metformin. blood tests are being requested for you today.  We'll contact you with results.   You could also try simethicone "gas-x," (non-prescription)for the stomach symptoms.

## 2013-07-13 NOTE — Progress Notes (Signed)
Subjective:    Patient ID: [name], female    DOB: 11-08-1925, 79 y.o.   MRN: [id number]  HPI The state of at least three ongoing medical problems is addressed today, with interval history of each noted here: Pt was in hospital overnight a few weeks ago for chest pain.  Pain was felt to be due to gerd.  She still has sxs intermittently. DM: she says off the metformin, cbg's are in the mid-100's.  denies hypoglycemia Anemia: she denies brbpr.   Past Medical History  Diagnosis Date  . Arrhythmia     paroxysmal atrial fibrillation  . Coronary artery disease   . Hypertension   . Hyperlipidemia   . Edema   . GERD (gastroesophageal reflux disease)   . Anemia B twelve deficiency   . Dizziness - giddy   . Abdominal pain   . Iron deficiency anemia, unspecified   . Pernicious anemia   . Thoracic or lumbosacral neuritis or radiculitis, unspecified   . Symptomatic menopausal or female climacteric states   . Lumbago   . Benign neoplasm of colon   . Allergic rhinitis, cause unspecified   . Type II diabetes mellitus   . Degeneration of lumbar or lumbosacral intervertebral disc   . Arthritis     "arms and hands" (06/30/2013)    Past Surgical History  Procedure Laterality Date  . Back surgery    . Cholecystectomy    . Appendectomy  1974  . Abdominal hysterectomy  1974  . Carpal tunnel release Bilateral 1970's  . Cataract extraction, bilateral Bilateral   . Cardiac catheterization    . Coronary angioplasty with stent placement      "1" (06/30/2013  . Lumbar disc surgery      History   Social History  . Marital Status: Widowed    Spouse Name: N/A    Number of Children: N/A  . Years of Education: N/A   Occupational History  . Not on file.   Social History Main Topics  . Smoking status: Never Smoker   . Smokeless tobacco: Current User    Types: Snuff  . Alcohol Use: No  . Drug Use: No  . Sexual Activity: No   Other Topics Concern  . Not on file   Social History  Narrative  . No narrative on file    Current Outpatient Prescriptions on File Prior to Visit  Medication Sig Dispense Refill  . aspirin 81 MG EC tablet Take 81 mg by mouth daily.        . bromocriptine (PARLODEL) 2.5 MG tablet Take 1 tablet (2.5 mg total) by mouth at bedtime.  30 tablet  11  . calcium carbonate (TUMS - DOSED IN MG ELEMENTAL CALCIUM) 500 MG chewable tablet Chew 1 tablet by mouth as needed for heartburn.      [name] lisinopril-hydrochlorothiazide (PRINZIDE,ZESTORETIC) 20-12.5 MG per tablet Take 1 tablet by mouth daily.      [name] loratadine-pseudoephedrine (CLARITIN-D 24-HOUR) 10-240 MG per 24 hr tablet Take 1 tablet by mouth. 1 daily as needed for congestion       . metoprolol succinate (TOPROL-XL) 50 MG 24 hr tablet Take 50 mg by mouth daily. Take with or immediately following a meal.      . nitroGLYCERIN (NITROSTAT) 0.4 MG SL tablet Place 0.4 mg under the tongue every 5 (five) minutes as needed.        . pravastatin (PRAVACHOL) 40 MG tablet Take 40 mg by mouth at bedtime.      [name]  sitaGLIPtin (JANUVIA) 100 MG tablet Take 1 tablet (100 mg total) by mouth daily.  90 tablet  1   No current facility-administered medications on file prior to visit.   Allergies  Allergen Reactions  . Celecoxib     REACTION: rash  . Erythromycin     REACTION: itching  . Nitrofurantoin     REACTION: rash  . Penicillins     REACTION: swelling of joints  . Piroxicam Other (See Comments)    unknown  . Povidone     REACTION: itching  . Sulfonamide Derivatives     REACTION: "like my head was full of water"   No family history on file.  BP 142/74  Ht 5\' 2"  (1.575 m)  Wt 131 lb (59.421 kg)  BMI 23.95 kg/m2  SpO2 97%  Review of Systems Denies hematuria and headache.     Objective:   Physical Exam VITAL SIGNS:  See vs page GENERAL: no distress Gait: slow but steady  Lab Results  Component Value Date   WBC 5.2 07/13/2013   HGB 10.6* 07/13/2013   HCT 32.2* 07/13/2013   PLT 210.0 07/13/2013    GLUCOSE 149* 07/13/2013   CHOL 156 11/20/2012   TRIG 147 11/20/2012   HDL 58 11/20/2012   LDLDIRECT 155.3 08/11/2008   LDLCALC 69 11/20/2012   ALT 12 06/30/2013   AST 20 06/30/2013   NA 135 07/13/2013   K 4.0 07/13/2013   CL 100 07/13/2013   CREATININE 1.0 07/13/2013   BUN 21 07/13/2013   CO2 27 07/13/2013   TSH 4.344 06/30/2013   INR 1.14 02/18/2010   HGBA1C 7.3* 04/28/2013   MICROALBUR 0.77 11/20/2012      Assessment & Plan:  Chest pain, due to gerd.  She should take ppi on an ongoing basis DM: she may be able to tolerate the metformin when on daily ppi fe-deficiency anemia: she may be able to tolerate fe tabs while on ppi rx, but the ppi will inhibit absorption.

## 2013-07-14 LAB — CBC WITH DIFFERENTIAL/PLATELET
Basophils Absolute: 0 10*3/uL (ref 0.0–0.1)
Basophils Relative: 0.3 % (ref 0.0–3.0)
Eosinophils Absolute: 0.3 10*3/uL (ref 0.0–0.7)
Eosinophils Relative: 6.1 % — ABNORMAL HIGH (ref 0.0–5.0)
HCT: 32.2 % — ABNORMAL LOW (ref 36.0–46.0)
Hemoglobin: 10.6 g/dL — ABNORMAL LOW (ref 12.0–15.0)
Lymphocytes Relative: 38.6 % (ref 12.0–46.0)
Lymphs Abs: 2 10*3/uL (ref 0.7–4.0)
MCHC: 32.9 g/dL (ref 30.0–36.0)
MCV: 89.6 fl (ref 78.0–100.0)
Monocytes Absolute: 0.7 10*3/uL (ref 0.1–1.0)
Monocytes Relative: 12.8 % — ABNORMAL HIGH (ref 3.0–12.0)
Neutro Abs: 2.2 10*3/uL (ref 1.4–7.7)
Neutrophils Relative %: 42.2 % — ABNORMAL LOW (ref 43.0–77.0)
Platelets: 210 10*3/uL (ref 150.0–400.0)
RBC: 3.59 Mil/uL — ABNORMAL LOW (ref 3.87–5.11)
RDW: 14.1 % (ref 11.5–14.6)
WBC: 5.2 10*3/uL (ref 4.5–10.5)

## 2013-07-14 LAB — BASIC METABOLIC PANEL
BUN: 21 mg/dL (ref 6–23)
CO2: 27 mEq/L (ref 19–32)
Calcium: 9.8 mg/dL (ref 8.4–10.5)
Chloride: 100 mEq/L (ref 96–112)
Creatinine, Ser: 1 mg/dL (ref 0.4–1.2)
GFR: 65.24 mL/min (ref >60.00)
Glucose, Bld: 149 mg/dL — ABNORMAL HIGH (ref 70–99)
Potassium: 4 mEq/L (ref 3.5–5.1)
Sodium: 135 mEq/L (ref 135–145)

## 2013-07-14 LAB — IBC PANEL
Iron: 37 ug/dL — ABNORMAL LOW (ref 42–145)
Saturation Ratios: 11.3 % — ABNORMAL LOW (ref 20.0–50.0)
Transferrin: 233.6 mg/dL (ref 212.0–360.0)

## 2013-08-04 ENCOUNTER — Other Ambulatory Visit: Payer: Self-pay

## 2013-08-04 MED ORDER — LISINOPRIL-HYDROCHLOROTHIAZIDE 20-12.5 MG PO TABS: 1.0000 | ORAL_TABLET | Freq: Every day | ORAL | Status: DC

## 2013-08-05 ENCOUNTER — Ambulatory Visit (INDEPENDENT_AMBULATORY_CARE_PROVIDER_SITE_OTHER): Payer: Medicare Other

## 2013-08-05 DIAGNOSIS — Z23 Encounter for immunization: Secondary | ICD-10-CM | POA: Diagnosis not present

## 2013-08-10 DIAGNOSIS — Z01419 Encounter for gynecological examination (general) (routine) without abnormal findings: Secondary | ICD-10-CM | POA: Diagnosis not present

## 2013-08-13 ENCOUNTER — Ambulatory Visit (INDEPENDENT_AMBULATORY_CARE_PROVIDER_SITE_OTHER): Payer: Medicare Other | Admitting: Cardiovascular Disease

## 2013-08-13 ENCOUNTER — Encounter: Payer: Self-pay | Admitting: Cardiovascular Disease

## 2013-08-13 VITALS — BP 176/78 | HR 68 | Ht 62.0 in | Wt 130.0 lb

## 2013-08-13 DIAGNOSIS — I2510 Atherosclerotic heart disease of native coronary artery without angina pectoris: Secondary | ICD-10-CM

## 2013-08-13 DIAGNOSIS — I4891 Unspecified atrial fibrillation: Secondary | ICD-10-CM | POA: Diagnosis not present

## 2013-08-13 DIAGNOSIS — I10 Essential (primary) hypertension: Secondary | ICD-10-CM

## 2013-08-13 MED ORDER — METOPROLOL SUCCINATE ER 50 MG PO TB24: 50.0000 mg | ORAL_TABLET | Freq: Every day | ORAL | Status: DC

## 2013-08-13 MED ORDER — LISINOPRIL-HYDROCHLOROTHIAZIDE 20-12.5 MG PO TABS: 1.0000 | ORAL_TABLET | Freq: Every day | ORAL | Status: DC

## 2013-08-13 MED ORDER — PRAVASTATIN SODIUM 40 MG PO TABS: 40.0000 mg | ORAL_TABLET | Freq: Every day | ORAL | Status: DC

## 2013-08-13 NOTE — Progress Notes (Signed)
History of Present Illness: 79 yo female with history of CAD, atrial fibrillation, HTN, HLD, syncope who is here today for cardiac follow up. She has been followed in the past by Dr. Daleen Squibb. Last cath April 2011 with bare metal stent patent in LAD, moderate disease in mid Circumflex, mild disease RCA.  She tells me that she feels well. No chest pain or SOB. No palpitations.   Primary Care Physician: Everardo All  Last Lipid Profile:Lipid Panel     Component Value Date/Time   CHOL 156 11/20/2012 1035   TRIG 147 11/20/2012 1035   HDL 58 11/20/2012 1035   CHOLHDL 2.7 11/20/2012 1035   VLDL 29 11/20/2012 1035   LDLCALC 69 11/20/2012 1035     Past Medical History  Diagnosis Date  . Arrhythmia     paroxysmal atrial fibrillation  . Coronary artery disease   . Hypertension   . Hyperlipidemia   . Edema   . GERD (gastroesophageal reflux disease)   . Anemia B twelve deficiency   . Dizziness - giddy   . Abdominal pain   . Iron deficiency anemia, unspecified   . Pernicious anemia   . Thoracic or lumbosacral neuritis or radiculitis, unspecified   . Symptomatic menopausal or female climacteric states   . Lumbago   . Benign neoplasm of colon   . Allergic rhinitis, cause unspecified   . Type II diabetes mellitus   . Degeneration of lumbar or lumbosacral intervertebral disc   . Arthritis     "arms and hands" (06/30/2013)    Past Surgical History  Procedure Laterality Date  . Back surgery    . Cholecystectomy    . Appendectomy  1974  . Abdominal hysterectomy  1974  . Carpal tunnel release Bilateral 1970's  . Cataract extraction, bilateral Bilateral   . Cardiac catheterization    . Coronary angioplasty with stent placement      "1" (06/30/2013  . Lumbar disc surgery      Current Outpatient Prescriptions  Medication Sig Dispense Refill  . aspirin 81 MG EC tablet Take 81 mg by mouth daily.        . bromocriptine (PARLODEL) 2.5 MG tablet Take 1 tablet (2.5 mg total) by mouth at bedtime.   30 tablet  11  . calcium carbonate (TUMS - DOSED IN MG ELEMENTAL CALCIUM) 500 MG chewable tablet Chew 1 tablet by mouth as needed for heartburn.      [name] lisinopril-hydrochlorothiazide (PRINZIDE,ZESTORETIC) 20-12.5 MG per tablet Take 1 tablet by mouth daily.  30 tablet  6  . loratadine-pseudoephedrine (CLARITIN-D 24-HOUR) 10-240 MG per 24 hr tablet Take 1 tablet by mouth. 1 daily as needed for congestion       . metoprolol succinate (TOPROL-XL) 50 MG 24 hr tablet Take 50 mg by mouth daily. Take with or immediately following a meal.      . nitroGLYCERIN (NITROSTAT) 0.4 MG SL tablet Place 0.4 mg under the tongue every 5 (five) minutes as needed.        [name] omeprazole (PRILOSEC) 40 MG capsule Take 1 capsule (40 mg total) by mouth daily.  30 capsule  11  . pravastatin (PRAVACHOL) 40 MG tablet Take 40 mg by mouth at bedtime.      . sitaGLIPtin (JANUVIA) 100 MG tablet Take 1 tablet (100 mg total) by mouth daily.  90 tablet  1   No current facility-administered medications for this visit.    Allergies  Allergen Reactions  . Celecoxib  REACTION: rash  . Erythromycin     REACTION: itching  . Nitrofurantoin     REACTION: rash  . Penicillins     REACTION: swelling of joints  . Piroxicam Other (See Comments)    unknown  . Povidone     REACTION: itching  . Sulfonamide Derivatives     REACTION: "like my head was full of water"    History   Social History  . Marital Status: Widowed    Spouse Name: N/A    Number of Children: N/A  . Years of Education: N/A   Occupational History  . Not on file.   Social History Main Topics  . Smoking status: Never Smoker   . Smokeless tobacco: Current User    Types: Snuff  . Alcohol Use: No  . Drug Use: No  . Sexual Activity: No   Other Topics Concern  . Not on file   Social History Narrative  . No narrative on file    No family history on file.  Review of Systems:  As stated in the HPI and otherwise negative.   BP 176/78  Pulse 68  Ht 5'  2" (1.575 m)  Wt 130 lb (58.968 kg)  BMI 23.77 kg/m2  Physical Examination: General: Well developed, well nourished, NAD HEENT: OP clear, mucus membranes moist SKIN: warm, dry. No rashes. Neuro: No focal deficits Musculoskeletal: Muscle strength 5/5 all ext Psychiatric: Mood and affect normal Neck: No JVD, no carotid bruits, no thyromegaly, no lymphadenopathy. Lungs:Clear bilaterally, no wheezes, rhonci, crackles Cardiovascular: Regular rate and rhythm. No murmurs, gallops or rubs. Abdomen:Soft. Bowel sounds present. Non-tender.  Extremities: No lower extremity edema. Pulses are 2 + in the bilateral DP/PT.  Assessment and Plan:   1. CAD: Stable. Continue ASA, beta blocker, statin. EKG August 2014 reviewed.   2. Paroxysmal atrial fibrillation: Maintaining sinus rhythm. She is on ASA. She has not been started on anti-coagulation in the past due to fall risk.   3. HTN: BP elevated today. She will follow at home. We could increase her Lisinopril/HCTZ if remains elevated.

## 2013-08-13 NOTE — Patient Instructions (Addendum)
Your physician wants you to follow-up in:  6 months. You will receive a reminder letter in the mail two months in advance. If you don't receive a letter, please call our office to schedule the follow-up appointment.   

## 2013-08-16 ENCOUNTER — Other Ambulatory Visit: Payer: Self-pay

## 2013-08-16 MED ORDER — BROMOCRIPTINE MESYLATE 2.5 MG PO TABS: 2.5000 mg | ORAL_TABLET | Freq: Every day | ORAL | Status: DC

## 2013-10-06 ENCOUNTER — Other Ambulatory Visit: Payer: Self-pay | Admitting: Endocrinology

## 2013-10-11 ENCOUNTER — Telehealth: Payer: Self-pay | Admitting: *Deleted

## 2013-10-11 NOTE — Telephone Encounter (Signed)
please call patient: If you are sick, please come in for ov

## 2013-10-11 NOTE — Telephone Encounter (Signed)
Pt sent in for a rx refill for doxycycline (VIBRA-TABS) 100 MG tablet. Please advise.

## 2013-10-15 ENCOUNTER — Telehealth: Payer: Self-pay | Admitting: Endocrinology

## 2013-10-15 NOTE — Telephone Encounter (Signed)
Daughter called in and has questions about pt's medication  Call back:[phone]  Thank you :)

## 2013-10-15 NOTE — Telephone Encounter (Signed)
Tried # back, no answer left message to call back.

## 2013-10-15 NOTE — Telephone Encounter (Signed)
Called back and answered questions

## 2013-11-12 ENCOUNTER — Other Ambulatory Visit: Payer: Self-pay | Admitting: *Deleted

## 2013-11-12 MED ORDER — METFORMIN HCL ER 500 MG PO TB24: 500.0000 mg | ORAL_TABLET | Freq: Two times a day (BID) | ORAL | Status: DC

## 2013-11-26 ENCOUNTER — Ambulatory Visit (INDEPENDENT_AMBULATORY_CARE_PROVIDER_SITE_OTHER): Payer: Medicare Other | Admitting: Endocrinology

## 2013-11-26 ENCOUNTER — Encounter: Payer: Self-pay | Admitting: Endocrinology

## 2013-11-26 VITALS — BP 110/58 | HR 75 | Temp 97.6°F | Ht 62.0 in | Wt 125.0 lb

## 2013-11-26 DIAGNOSIS — I10 Essential (primary) hypertension: Secondary | ICD-10-CM

## 2013-11-26 DIAGNOSIS — E119 Type 2 diabetes mellitus without complications: Secondary | ICD-10-CM | POA: Diagnosis not present

## 2013-11-26 DIAGNOSIS — E785 Hyperlipidemia, unspecified: Secondary | ICD-10-CM | POA: Diagnosis not present

## 2013-11-26 DIAGNOSIS — D509 Iron deficiency anemia, unspecified: Secondary | ICD-10-CM | POA: Diagnosis not present

## 2013-11-26 DIAGNOSIS — E871 Hypo-osmolality and hyponatremia: Secondary | ICD-10-CM

## 2013-11-26 DIAGNOSIS — R51 Headache: Secondary | ICD-10-CM | POA: Diagnosis not present

## 2013-11-26 DIAGNOSIS — I4891 Unspecified atrial fibrillation: Secondary | ICD-10-CM | POA: Diagnosis not present

## 2013-11-26 DIAGNOSIS — R519 Headache, unspecified: Secondary | ICD-10-CM | POA: Insufficient documentation

## 2013-11-26 DIAGNOSIS — Z79899 Other long term (current) drug therapy: Secondary | ICD-10-CM

## 2013-11-26 LAB — URINALYSIS, ROUTINE W REFLEX MICROSCOPIC
Bilirubin Urine: NEGATIVE
Hgb urine dipstick: NEGATIVE
Nitrite: NEGATIVE
Specific Gravity, Urine: 1.02 (ref 1.000–1.030)
Total Protein, Urine: NEGATIVE
Urine Glucose: NEGATIVE
Urobilinogen, UA: 0.2 (ref 0.0–1.0)
pH: 6 (ref 5.0–8.0)

## 2013-11-26 LAB — LIPID PANEL
Cholesterol: 165 mg/dL (ref 0–200)
HDL: 58.2 mg/dL (ref >39.00)
LDL Cholesterol: 73 mg/dL (ref 0–99)
Total CHOL/HDL Ratio: 3
Triglycerides: 168 mg/dL — ABNORMAL HIGH (ref 0.0–149.0)
VLDL: 33.6 mg/dL (ref 0.0–40.0)

## 2013-11-26 LAB — HEPATIC FUNCTION PANEL
ALT: 13 U/L (ref 0–35)
AST: 20 U/L (ref 0–37)
Albumin: 4 g/dL (ref 3.5–5.2)
Alkaline Phosphatase: 79 U/L (ref 39–117)
Bilirubin, Direct: 0 mg/dL (ref 0.0–0.3)
Total Bilirubin: 0.9 mg/dL (ref 0.3–1.2)
Total Protein: 6.8 g/dL (ref 6.0–8.3)

## 2013-11-26 LAB — BASIC METABOLIC PANEL
BUN: 21 mg/dL (ref 6–23)
CO2: 26 mEq/L (ref 19–32)
Calcium: 9.3 mg/dL (ref 8.4–10.5)
Chloride: 103 mEq/L (ref 96–112)
Creatinine, Ser: 1.1 mg/dL (ref 0.4–1.2)
GFR: 63.76 mL/min (ref >60.00)
Glucose, Bld: 185 mg/dL — ABNORMAL HIGH (ref 70–99)
Potassium: 3.9 mEq/L (ref 3.5–5.1)
Sodium: 138 mEq/L (ref 135–145)

## 2013-11-26 LAB — CBC WITH DIFFERENTIAL/PLATELET
Basophils Absolute: 0 10*3/uL (ref 0.0–0.1)
Basophils Relative: 0.5 % (ref 0.0–3.0)
Eosinophils Absolute: 0.2 10*3/uL (ref 0.0–0.7)
Eosinophils Relative: 4.1 % (ref 0.0–5.0)
HCT: 33.2 % — ABNORMAL LOW (ref 36.0–46.0)
Hemoglobin: 10.9 g/dL — ABNORMAL LOW (ref 12.0–15.0)
Lymphocytes Relative: 38 % (ref 12.0–46.0)
Lymphs Abs: 2 10*3/uL (ref 0.7–4.0)
MCHC: 32.8 g/dL (ref 30.0–36.0)
MCV: 89.5 fl (ref 78.0–100.0)
Monocytes Absolute: 0.5 10*3/uL (ref 0.1–1.0)
Monocytes Relative: 9.5 % (ref 3.0–12.0)
Neutro Abs: 2.5 10*3/uL (ref 1.4–7.7)
Neutrophils Relative %: 47.9 % (ref 43.0–77.0)
Platelets: 233 10*3/uL (ref 150.0–400.0)
RBC: 3.71 Mil/uL — ABNORMAL LOW (ref 3.87–5.11)
RDW: 15.2 % — ABNORMAL HIGH (ref 11.5–14.6)
WBC: 5.3 10*3/uL (ref 4.5–10.5)

## 2013-11-26 LAB — MICROALBUMIN / CREATININE URINE RATIO
Creatinine,U: 146.4 mg/dL
Microalb Creat Ratio: 1.3 mg/g (ref 0.0–30.0)
Microalb, Ur: 1.9 mg/dL (ref 0.0–1.9)

## 2013-11-26 LAB — IBC PANEL
Iron: 42 ug/dL (ref 42–145)
Saturation Ratios: 13.3 % — ABNORMAL LOW (ref 20.0–50.0)
Transferrin: 225.6 mg/dL (ref 212.0–360.0)

## 2013-11-26 LAB — TSH: TSH: 3.28 u[IU]/mL (ref 0.35–5.50)

## 2013-11-26 LAB — HEMOGLOBIN A1C: Hgb A1c MFr Bld: 8 % — ABNORMAL HIGH (ref 4.6–6.5)

## 2013-11-26 LAB — SEDIMENTATION RATE: Sed Rate: 28 mm/hr — ABNORMAL HIGH (ref 0–22)

## 2013-11-26 MED ORDER — NATEGLINIDE 60 MG PO TABS: 60.0000 mg | ORAL_TABLET | Freq: Three times a day (TID) | ORAL | Status: DC

## 2013-11-26 MED ORDER — LEVOFLOXACIN 500 MG PO TABS
ORAL_TABLET | ORAL | Status: DC
Start: 2013-11-26 — End: 2013-12-10

## 2013-11-26 NOTE — Progress Notes (Signed)
Subjective:    Patient ID: [name], female    DOB: 12-Aug-1925, 80 y.o.   MRN: [id number]  HPI Pt says 5 days of moderate headache, worse at the bifrontal areas.  No assoc nasal congestion.  Pt says she does not get headache in general.  Pt thinks HA is due to sinus infection.  dtr says she does not have time to get CT done today. Past Medical History  Diagnosis Date  . Arrhythmia     paroxysmal atrial fibrillation  . Coronary artery disease   . Hypertension   . Hyperlipidemia   . Edema   . GERD (gastroesophageal reflux disease)   . Anemia B twelve deficiency   . Dizziness - giddy   . Abdominal pain   . Iron deficiency anemia, unspecified   . Pernicious anemia   . Thoracic or lumbosacral neuritis or radiculitis, unspecified   . Symptomatic menopausal or female climacteric states   . Lumbago   . Benign neoplasm of colon   . Allergic rhinitis, cause unspecified   . Type II diabetes mellitus   . Degeneration of lumbar or lumbosacral intervertebral disc   . Arthritis     "arms and hands" (06/30/2013)    Past Surgical History  Procedure Laterality Date  . Back surgery    . Cholecystectomy    . Appendectomy  1974  . Abdominal hysterectomy  1974  . Carpal tunnel release Bilateral 1970's  . Cataract extraction, bilateral Bilateral   . Cardiac catheterization    . Coronary angioplasty with stent placement      "1" (06/30/2013  . Lumbar disc surgery      History   Social History  . Marital Status: Widowed    Spouse Name: N/A    Number of Children: N/A  . Years of Education: N/A   Occupational History  . Not on file.   Social History Main Topics  . Smoking status: Never Smoker   . Smokeless tobacco: Current User    Types: Snuff  . Alcohol Use: No  . Drug Use: No  . Sexual Activity: No   Other Topics Concern  . Not on file   Social History Narrative  . No narrative on file    Current Outpatient Prescriptions on File Prior to Visit  Medication Sig  Dispense Refill  . aspirin 81 MG EC tablet Take 81 mg by mouth daily.        . bromocriptine (PARLODEL) 2.5 MG tablet Take 1 tablet (2.5 mg total) by mouth at bedtime.  30 tablet  11  . calcium carbonate (TUMS - DOSED IN MG ELEMENTAL CALCIUM) 500 MG chewable tablet Chew 1 tablet by mouth as needed for heartburn.      [name] lisinopril-hydrochlorothiazide (PRINZIDE,ZESTORETIC) 20-12.5 MG per tablet Take 1 tablet by mouth daily.  30 tablet  11  . loratadine-pseudoephedrine (CLARITIN-D 24-HOUR) 10-240 MG per 24 hr tablet Take 1 tablet by mouth. 1 daily as needed for congestion       . metFORMIN (GLUCOPHAGE XR) 500 MG 24 hr tablet Take 1 tablet (500 mg total) by mouth 2 (two) times daily.  60 tablet  0  . metoprolol succinate (TOPROL-XL) 50 MG 24 hr tablet Take 1 tablet (50 mg total) by mouth daily. Take with or immediately following a meal.  30 tablet  11  . nitroGLYCERIN (NITROSTAT) 0.4 MG SL tablet Place 0.4 mg under the tongue every 5 (five) minutes as needed.        [name]  omeprazole (PRILOSEC) 40 MG capsule Take 1 capsule (40 mg total) by mouth daily.  30 capsule  11  . pravastatin (PRAVACHOL) 40 MG tablet Take 1 tablet (40 mg total) by mouth at bedtime.  30 tablet  11  . sitaGLIPtin (JANUVIA) 100 MG tablet Take 1 tablet (100 mg total) by mouth daily.  90 tablet  1   No current facility-administered medications on file prior to visit.    Allergies  Allergen Reactions  . Celecoxib     REACTION: rash  . Erythromycin     REACTION: itching  . Nitrofurantoin     REACTION: rash  . Penicillins     REACTION: swelling of joints  . Piroxicam Other (See Comments)    unknown  . Povidone     REACTION: itching  . Sulfonamide Derivatives     REACTION: "like my head was full of water"    No family history on file.  BP 110/58  Pulse 75  Temp(Src) 97.6 F (36.4 C) (Oral)  Ht 5\' 2"  (1.575 m)  Wt 125 lb (56.7 kg)  BMI 22.86 kg/m2  SpO2 97%  Review of Systems Denies fever, nasal congestion, and  visual loss    Objective:   Physical Exam VS: see vs page GEN: no distress HEAD: head: no deformity. eyes: no periorbital swelling, no proptosis. external nose and ears are normal. mouth: no lesion seen. NECK: supple.    MUSCULOSKELETAL: muscle bulk and strength are grossly normal for age.  gait is normal and steady NEURO:  cn 2-12 grossly intact.   readily moves all 4's.  sensation is intact to touch on the feet PSYCH: alert, well-oriented.  Does not appear anxious nor depressed.    Lab Results  Component Value Date   WBC 5.3 11/26/2013   HGB 10.9* 11/26/2013   HCT 33.2* 11/26/2013   PLT 233.0 11/26/2013   GLUCOSE 185* 11/26/2013   CHOL 165 11/26/2013   TRIG 168.0* 11/26/2013   HDL 58.20 11/26/2013   LDLDIRECT 155.3 08/11/2008   LDLCALC 73 11/26/2013   ALT 13 11/26/2013   AST 20 11/26/2013   NA 138 11/26/2013   K 3.9 11/26/2013   CL 103 11/26/2013   CREATININE 1.1 11/26/2013   BUN 21 11/26/2013   CO2 26 11/26/2013   TSH 3.28 11/26/2013   INR 1.14 02/18/2010   HGBA1C 8.0* 11/26/2013   MICROALBUR 1.9 11/26/2013      Assessment & Plan:  Headache, new, uncertain etiology. DM: she needs increased rx fe-deficiency anemia: w/u is not indicated at this advanced age.

## 2013-11-26 NOTE — Patient Instructions (Addendum)
blood tests are being requested for you today.  We'll contact you with results.    Please come back soon for a "medicare wellness" appointment.   Let's check a CT scan.   i have sent a prescription to your pharmacy, for an antibiotic pill Loratadine-d (non-prescription) will help your congestion.

## 2013-12-01 ENCOUNTER — Ambulatory Visit (INDEPENDENT_AMBULATORY_CARE_PROVIDER_SITE_OTHER)
Admission: RE | Admit: 2013-12-01 | Discharge: 2013-12-01 | Disposition: A | Discharge status: Home / Self Care | Payer: Medicare Other | Source: Ambulatory Visit | Attending: Endocrinology | Admitting: Endocrinology

## 2013-12-01 DIAGNOSIS — R51 Headache: Secondary | ICD-10-CM | POA: Diagnosis not present

## 2013-12-06 ENCOUNTER — Ambulatory Visit (INDEPENDENT_AMBULATORY_CARE_PROVIDER_SITE_OTHER): Payer: Medicare Other | Admitting: Endocrinology

## 2013-12-06 ENCOUNTER — Encounter: Payer: Self-pay | Admitting: Endocrinology

## 2013-12-06 VITALS — BP 120/62 | HR 75 | Temp 97.7°F | Ht 62.0 in | Wt 126.0 lb

## 2013-12-06 DIAGNOSIS — Z0000 Encounter for general adult medical examination without abnormal findings: Secondary | ICD-10-CM

## 2013-12-06 NOTE — Progress Notes (Signed)
Subjective:    Patient ID: [name], female    DOB: 1925-06-18, 80 y.o.   MRN: [id number]  HPI    Review of Systems     Objective:   Physical Exam        Assessment & Plan:      Subjective:   Patient here for Medicare annual wellness visit and management of other chronic and acute problems.     Risk factors: advanced age    85 of Physicians Providing Medical Care to Patient:  See "snapshot"   Activities of Daily Living: In your present state of health, do you have any difficulty performing the following activities?:  Preparing food and eating?: No  Bathing yourself: No  Getting dressed: No  Using the toilet:No  Moving around from place to place: No  In the past year have you fallen or had a near fall?:No    Home Safety: Has smoke detector and wears seat belts. Firearms are safely stored.   Diet and Exercise  Current exercise habits: pt says good Dietary issues discussed: pt reports a healthy diet   Depression Screen  Q1: Over the past two weeks, have you felt down, depressed or hopeless? no  Q2: Over the past two weeks, have you felt little interest or pleasure in doing things? no   The following portions of the patient's history were reviewed and updated as appropriate: allergies, current medications, past family history, past medical history, past social history, past surgical history and problem list.  Past Medical History  Diagnosis Date  . Arrhythmia     paroxysmal atrial fibrillation  . Coronary artery disease   . Hypertension   . Hyperlipidemia   . Edema   . GERD (gastroesophageal reflux disease)   . Anemia B twelve deficiency   . Dizziness - giddy   . Abdominal pain   . Iron deficiency anemia, unspecified   . Pernicious anemia   . Thoracic or lumbosacral neuritis or radiculitis, unspecified   . Symptomatic menopausal or female climacteric states   . Lumbago   . Benign neoplasm of colon   . Allergic rhinitis, cause unspecified     . Type II diabetes mellitus   . Degeneration of lumbar or lumbosacral intervertebral disc   . Arthritis     "arms and hands" (06/30/2013)    Past Surgical History  Procedure Laterality Date  . Back surgery    . Cholecystectomy    . Appendectomy  1974  . Abdominal hysterectomy  1974  . Carpal tunnel release Bilateral 1970's  . Cataract extraction, bilateral Bilateral   . Cardiac catheterization    . Coronary angioplasty with stent placement      "1" (06/30/2013  . Lumbar disc surgery      History   Social History  . Marital Status: Widowed    Spouse Name: N/A    Number of Children: N/A  . Years of Education: N/A   Occupational History  . Not on file.   Social History Main Topics  . Smoking status: Never Smoker   . Smokeless tobacco: Current User    Types: Snuff  . Alcohol Use: No  . Drug Use: No  . Sexual Activity: No   Other Topics Concern  . Not on file   Social History Narrative  . No narrative on file    Current Outpatient Prescriptions on File Prior to Visit  Medication Sig Dispense Refill  . aspirin 81 MG EC tablet Take 81 mg by mouth  daily.        . bromocriptine (PARLODEL) 2.5 MG tablet Take 1 tablet (2.5 mg total) by mouth at bedtime.  30 tablet  11  . calcium carbonate (TUMS - DOSED IN MG ELEMENTAL CALCIUM) 500 MG chewable tablet Chew 1 tablet by mouth as needed for heartburn.      . levofloxacin (LEVAQUIN) 500 MG tablet 1/2 tab daily  4 tablet  0  . lisinopril-hydrochlorothiazide (PRINZIDE,ZESTORETIC) 20-12.5 MG per tablet Take 1 tablet by mouth daily.  30 tablet  11  . loratadine-pseudoephedrine (CLARITIN-D 24-HOUR) 10-240 MG per 24 hr tablet Take 1 tablet by mouth. 1 daily as needed for congestion       . metFORMIN (GLUCOPHAGE XR) 500 MG 24 hr tablet Take 1 tablet (500 mg total) by mouth 2 (two) times daily.  60 tablet  0  . metoprolol succinate (TOPROL-XL) 50 MG 24 hr tablet Take 1 tablet (50 mg total) by mouth daily. Take with or immediately  following a meal.  30 tablet  11  . nateglinide (STARLIX) 60 MG tablet Take 1 tablet (60 mg total) by mouth 3 (three) times daily with meals.  90 tablet  11  . nitroGLYCERIN (NITROSTAT) 0.4 MG SL tablet Place 0.4 mg under the tongue every 5 (five) minutes as needed.        [name] omeprazole (PRILOSEC) 40 MG capsule Take 1 capsule (40 mg total) by mouth daily.  30 capsule  11  . pravastatin (PRAVACHOL) 40 MG tablet Take 1 tablet (40 mg total) by mouth at bedtime.  30 tablet  11  . sitaGLIPtin (JANUVIA) 100 MG tablet Take 1 tablet (100 mg total) by mouth daily.  90 tablet  1   No current facility-administered medications on file prior to visit.    Allergies  Allergen Reactions  . Celecoxib     REACTION: rash  . Erythromycin     REACTION: itching  . Nitrofurantoin     REACTION: rash  . Penicillins     REACTION: swelling of joints  . Piroxicam Other (See Comments)    unknown  . Povidone     REACTION: itching  . Sulfonamide Derivatives     REACTION: "like my head was full of water"    No family history on file.  BP 120/62  Pulse 75  Temp(Src) 97.7 F (36.5 C) (Oral)  Ht 5\' 2"  (1.575 m)  Wt 126 lb (57.153 kg)  BMI 23.04 kg/m2  SpO2 95%   Review of Systems  Denies hearing loss, and visual loss Objective:   Vision:  Sees opthalmologist Hearing: grossly normal Body mass index:  See vs page Msk: pt easily and quickly performs "get-up-and-go" from a sitting position Cognitive Impairment Assessment: cognition, memory and judgment appear normal.  remembers 1/3 at 5 minutes (? Effort).  excellent recall.  can easily read and write a sentence.  alert and oriented x 3.      Assessment:   Medicare wellness utd on preventive parameters    Plan:   During the course of the visit the patient was educated and counseled about appropriate screening and preventive services including:        Fall prevention  Bone densitometry screening   Vaccines / LABS Zostavax / Pneumococcal Vaccine   today   Patient Instructions (the written plan) was given to the patient.   we discussed code status.  pt requests full code, but would not want to be started or maintained on artificial life-support measures if there  was not a reasonable chance of recovery

## 2013-12-06 NOTE — Patient Instructions (Addendum)
please consider these measures for your health:  minimize alcohol.  do not use tobacco products.  have a colonoscopy at least every 10 years from age 80.  keep firearms safely stored.  always use seat belts.  have working smoke alarms in your home.  see an eye doctor and dentist regularly.  never drive under the influence of alcohol or drugs (including prescription drugs).   it is critically important to prevent falling down (keep floor areas well-lit, dry, and free of loose objects.  If you have a cane, walker, or wheelchair, you should use it, even for short trips around the house.  Also, try not to rush). Please come back for a follow-up appointment in 3 months.   Mammography, colonoscopy, dietary counseling, and w/u of anemia are not indicated at this age. You should have a vaccine against shingles (a painful rash which results from the  chickenpox infection which most people had many years ago).  This vaccine reduces, but does not totally eliminate the risk of shingles.  Because this is a medicare part d benefit, you should get it at a pharmacy.

## 2013-12-10 ENCOUNTER — Emergency Department (HOSPITAL_COMMUNITY): Payer: Medicare Other

## 2013-12-10 ENCOUNTER — Encounter (HOSPITAL_COMMUNITY): Payer: Self-pay | Admitting: Emergency Medicine

## 2013-12-10 ENCOUNTER — Emergency Department (HOSPITAL_COMMUNITY)
Admission: EM | Admit: 2013-12-10 | Discharge: 2013-12-11 | Disposition: A | Discharge status: Home / Self Care | Payer: Medicare Other | Attending: Emergency Medicine | Admitting: Emergency Medicine

## 2013-12-10 DIAGNOSIS — Z8742 Personal history of other diseases of the female genital tract: Secondary | ICD-10-CM | POA: Diagnosis not present

## 2013-12-10 DIAGNOSIS — E119 Type 2 diabetes mellitus without complications: Secondary | ICD-10-CM | POA: Diagnosis not present

## 2013-12-10 DIAGNOSIS — I2510 Atherosclerotic heart disease of native coronary artery without angina pectoris: Secondary | ICD-10-CM | POA: Insufficient documentation

## 2013-12-10 DIAGNOSIS — R5381 Other malaise: Secondary | ICD-10-CM | POA: Insufficient documentation

## 2013-12-10 DIAGNOSIS — I4891 Unspecified atrial fibrillation: Secondary | ICD-10-CM | POA: Diagnosis not present

## 2013-12-10 DIAGNOSIS — Z7982 Long term (current) use of aspirin: Secondary | ICD-10-CM | POA: Diagnosis not present

## 2013-12-10 DIAGNOSIS — Z862 Personal history of diseases of the blood and blood-forming organs and certain disorders involving the immune mechanism: Secondary | ICD-10-CM | POA: Diagnosis not present

## 2013-12-10 DIAGNOSIS — K219 Gastro-esophageal reflux disease without esophagitis: Secondary | ICD-10-CM | POA: Insufficient documentation

## 2013-12-10 DIAGNOSIS — M19049 Primary osteoarthritis, unspecified hand: Secondary | ICD-10-CM | POA: Insufficient documentation

## 2013-12-10 DIAGNOSIS — Z9861 Coronary angioplasty status: Secondary | ICD-10-CM | POA: Insufficient documentation

## 2013-12-10 DIAGNOSIS — Z792 Long term (current) use of antibiotics: Secondary | ICD-10-CM | POA: Insufficient documentation

## 2013-12-10 DIAGNOSIS — R5383 Other fatigue: Secondary | ICD-10-CM

## 2013-12-10 DIAGNOSIS — Z79899 Other long term (current) drug therapy: Secondary | ICD-10-CM | POA: Diagnosis not present

## 2013-12-10 DIAGNOSIS — Z880 Allergy status to penicillin: Secondary | ICD-10-CM | POA: Diagnosis not present

## 2013-12-10 DIAGNOSIS — I10 Essential (primary) hypertension: Secondary | ICD-10-CM | POA: Diagnosis not present

## 2013-12-10 DIAGNOSIS — R404 Transient alteration of awareness: Secondary | ICD-10-CM | POA: Diagnosis not present

## 2013-12-10 DIAGNOSIS — R55 Syncope and collapse: Secondary | ICD-10-CM | POA: Diagnosis not present

## 2013-12-10 DIAGNOSIS — E785 Hyperlipidemia, unspecified: Secondary | ICD-10-CM | POA: Diagnosis not present

## 2013-12-10 DIAGNOSIS — M19039 Primary osteoarthritis, unspecified wrist: Secondary | ICD-10-CM | POA: Insufficient documentation

## 2013-12-10 LAB — POCT I-STAT TROPONIN I: Troponin i, poc: 0.01 ng/mL (ref 0.00–0.08)

## 2013-12-10 LAB — COMPREHENSIVE METABOLIC PANEL
ALT: 10 U/L (ref 0–35)
AST: 19 U/L (ref 0–37)
Albumin: 3.6 g/dL (ref 3.5–5.2)
Alkaline Phosphatase: 75 U/L (ref 39–117)
BUN: 34 mg/dL — ABNORMAL HIGH (ref 6–23)
CO2: 21 mEq/L (ref 19–32)
Calcium: 9.5 mg/dL (ref 8.4–10.5)
Chloride: 102 mEq/L (ref 96–112)
Creatinine, Ser: 1.19 mg/dL — ABNORMAL HIGH (ref 0.50–1.10)
GFR calc Af Amer: 46 mL/min — ABNORMAL LOW (ref >90)
GFR calc non Af Amer: 40 mL/min — ABNORMAL LOW (ref >90)
Glucose, Bld: 142 mg/dL — ABNORMAL HIGH (ref 70–99)
Potassium: 4.1 mEq/L (ref 3.7–5.3)
Sodium: 138 mEq/L (ref 137–147)
Total Bilirubin: 0.5 mg/dL (ref 0.3–1.2)
Total Protein: 6.8 g/dL (ref 6.0–8.3)

## 2013-12-10 LAB — CBC WITH DIFFERENTIAL/PLATELET
Basophils Absolute: 0 10*3/uL (ref 0.0–0.1)
Basophils Relative: 0 % (ref 0–1)
Eosinophils Absolute: 0.2 10*3/uL (ref 0.0–0.7)
Eosinophils Relative: 3 % (ref 0–5)
HCT: 30.3 % — ABNORMAL LOW (ref 36.0–46.0)
Hemoglobin: 10.3 g/dL — ABNORMAL LOW (ref 12.0–15.0)
Lymphocytes Relative: 24 % (ref 12–46)
Lymphs Abs: 1.7 10*3/uL (ref 0.7–4.0)
MCH: 29.5 pg (ref 26.0–34.0)
MCHC: 34 g/dL (ref 30.0–36.0)
MCV: 86.8 fL (ref 78.0–100.0)
Monocytes Absolute: 0.5 10*3/uL (ref 0.1–1.0)
Monocytes Relative: 8 % (ref 3–12)
Neutro Abs: 4.6 10*3/uL (ref 1.7–7.7)
Neutrophils Relative %: 65 % (ref 43–77)
Platelets: 191 10*3/uL (ref 150–400)
RBC: 3.49 MIL/uL — ABNORMAL LOW (ref 3.87–5.11)
RDW: 14.1 % (ref 11.5–15.5)
WBC: 7.1 10*3/uL (ref 4.0–10.5)

## 2013-12-10 LAB — URINALYSIS, ROUTINE W REFLEX MICROSCOPIC
Bilirubin Urine: NEGATIVE
Glucose, UA: NEGATIVE mg/dL
Hgb urine dipstick: NEGATIVE
Ketones, ur: NEGATIVE mg/dL
Nitrite: NEGATIVE
Protein, ur: NEGATIVE mg/dL
Specific Gravity, Urine: 1.016 (ref 1.005–1.030)
Urobilinogen, UA: 0.2 mg/dL (ref 0.0–1.0)
pH: 5.5 (ref 5.0–8.0)

## 2013-12-10 LAB — URINE MICROSCOPIC-ADD ON

## 2013-12-10 MED ORDER — SODIUM CHLORIDE 0.9 % IV BOLUS (SEPSIS)
500.0000 mL | Freq: Once | INTRAVENOUS | Status: AC
  Administered 2013-12-10: 500 mL via INTRAVENOUS

## 2013-12-10 NOTE — ED Notes (Signed)
Pt reports to the ED for eval of near syncopal episode. Pt reports her daughter was helping her up to go get in bed when she became lightheaded, pale, and diaphoretic. Pt did not fully lose consciousness. 12 lead showed A. Fib at a rate of 60s-70s. Pt has hx of same. Pt denies any CP, nausea, or SOB. Denies any pain. Pt reports she felt a fluttering in her chest at the time of the incident but denies the sensation at this time. VSS en route. CBG 155 mg/dl. PT A&Ox4, resp e/u, and skin pale, warm, and dry.

## 2013-12-11 NOTE — ED Provider Notes (Signed)
CSN: [id number]     Arrival date & time 12/10/13  2025 History   First MD Initiated Contact with Patient 12/10/13 2046     Chief Complaint  Patient presents with  . Near Syncope   (Consider location/radiation/quality/duration/timing/severity/associated sxs/prior Treatment) Patient is a 80 y.o. female presenting with near-syncope. The history is provided by the patient.  Near Syncope This is a recurrent problem. The current episode started today. The problem occurs rarely. The problem has been resolved. Associated symptoms include fatigue and weakness. Pertinent negatives include no abdominal pain, anorexia, arthralgias, chest pain, chills, congestion, coughing, fever, headaches, myalgias, nausea, rash, sore throat, vertigo or vomiting. Nothing aggravates the symptoms. She has tried nothing for the symptoms. The treatment provided no relief.    Past Medical History  Diagnosis Date  . Arrhythmia     paroxysmal atrial fibrillation  . Coronary artery disease   . Hypertension   . Hyperlipidemia   . Edema   . GERD (gastroesophageal reflux disease)   . Anemia B twelve deficiency   . Dizziness - giddy   . Abdominal pain   . Iron deficiency anemia, unspecified   . Pernicious anemia   . Thoracic or lumbosacral neuritis or radiculitis, unspecified   . Symptomatic menopausal or female climacteric states   . Lumbago   . Benign neoplasm of colon   . Allergic rhinitis, cause unspecified   . Type II diabetes mellitus   . Degeneration of lumbar or lumbosacral intervertebral disc   . Arthritis     "arms and hands" (06/30/2013)   Past Surgical History  Procedure Laterality Date  . Back surgery    . Cholecystectomy    . Appendectomy  1974  . Abdominal hysterectomy  1974  . Carpal tunnel release Bilateral 1970's  . Cataract extraction, bilateral Bilateral   . Cardiac catheterization    . Coronary angioplasty with stent placement      "1" (06/30/2013  . Lumbar disc surgery     History  reviewed. No pertinent family history. History  Substance Use Topics  . Smoking status: Never Smoker   . Smokeless tobacco: Current User    Types: Snuff  . Alcohol Use: No   OB History   Grav Para Term Preterm Abortions TAB SAB Ect Mult Living                 Review of Systems  Constitutional: Positive for fatigue. Negative for fever and chills.  HENT: Negative for congestion, rhinorrhea and sore throat.   Eyes: Negative for redness and visual disturbance.  Respiratory: Negative for cough, shortness of breath and wheezing.   Cardiovascular: Positive for near-syncope. Negative for chest pain and palpitations.  Gastrointestinal: Negative for nausea, vomiting, abdominal pain and anorexia.  Genitourinary: Negative for dysuria and urgency.  Musculoskeletal: Negative for arthralgias and myalgias.  Skin: Negative for pallor, rash and wound.  Neurological: Positive for syncope (pre-syncope) and weakness. Negative for dizziness, vertigo and headaches.    Allergies  Penicillins; Sulfonamide derivatives; Celecoxib; Erythromycin; Nitrofurantoin; Piroxicam; and Povidone-iodine  Home Medications   Current Outpatient Rx  Name  Route  Sig  Dispense  Refill  . aspirin 81 MG EC tablet   Oral   Take 81 mg by mouth daily.           . bromocriptine (PARLODEL) 2.5 MG tablet   Oral   Take 1 tablet (2.5 mg total) by mouth at bedtime.   30 tablet   11     PT SAYS  SHE IS NOW TAKING 1 TABLET DAILY. PLEASE S ...   . calcium carbonate (TUMS - DOSED IN MG ELEMENTAL CALCIUM) 500 MG chewable tablet   Oral   Chew 1 tablet by mouth as needed for heartburn.         . levofloxacin (LEVAQUIN) 500 MG tablet   Oral   Take 250 mg by mouth daily.         [name] lisinopril-hydrochlorothiazide (PRINZIDE,ZESTORETIC) 20-12.5 MG per tablet   Oral   Take 1 tablet by mouth daily.   30 tablet   11   . loratadine-pseudoephedrine (CLARITIN-D 24-HOUR) 10-240 MG per 24 hr tablet   Oral   Take 1 tablet by  mouth. 1 daily as needed for congestion          . metFORMIN (GLUCOPHAGE XR) 500 MG 24 hr tablet   Oral   Take 1 tablet (500 mg total) by mouth 2 (two) times daily.   60 tablet   0     PT NEEDS TO SCHEDULE FOLLOW UP APPT. FOR FURTHER R ...   . metoprolol succinate (TOPROL-XL) 50 MG 24 hr tablet   Oral   Take 1 tablet (50 mg total) by mouth daily. Take with or immediately following a meal.   30 tablet   11   . nateglinide (STARLIX) 60 MG tablet   Oral   Take 1 tablet (60 mg total) by mouth 3 (three) times daily with meals.   90 tablet   11   . omeprazole (PRILOSEC) 40 MG capsule   Oral   Take 1 capsule (40 mg total) by mouth daily.   30 capsule   11   . pravastatin (PRAVACHOL) 40 MG tablet   Oral   Take 1 tablet (40 mg total) by mouth at bedtime.   30 tablet   11   . sitaGLIPtin (JANUVIA) 100 MG tablet   Oral   Take 1 tablet (100 mg total) by mouth daily.   90 tablet   1   . nitroGLYCERIN (NITROSTAT) 0.4 MG SL tablet   Sublingual   Place 0.4 mg under the tongue every 5 (five) minutes as needed.            BP 138/104  Pulse 74  Temp(Src) 97.8 F (36.6 C) (Oral)  Resp 14  SpO2 100% Physical Exam  Constitutional: She is oriented to person, place, and time. She appears well-developed and well-nourished. No distress.  Pale   HENT:  Head: Normocephalic and atraumatic.  Eyes: EOM are normal. Pupils are equal, round, and reactive to light.  Neck: Normal range of motion. Neck supple.  Cardiovascular: Normal rate and regular rhythm.  Exam reveals no gallop and no friction rub.   No murmur heard. Pulmonary/Chest: Effort normal. She has no wheezes. She has no rales.  Abdominal: Soft. She exhibits no distension. There is no tenderness. There is no rebound and no guarding.  Musculoskeletal: She exhibits no edema and no tenderness.  Neurological: She is alert and oriented to person, place, and time.  Skin: Skin is warm and dry. She is not diaphoretic.   Psychiatric: She has a normal mood and affect. Her behavior is normal.    ED Course  Procedures (including critical care time) Labs Review Labs Reviewed  CBC WITH DIFFERENTIAL - Abnormal; Notable for the following:    RBC 3.49 (*)    Hemoglobin 10.3 (*)    HCT 30.3 (*)    All other components within normal limits  COMPREHENSIVE  METABOLIC PANEL - Abnormal; Notable for the following:    Glucose, Bld 142 (*)    BUN 34 (*)    Creatinine, Ser 1.19 (*)    GFR calc non Af Amer 40 (*)    GFR calc Af Amer 46 (*)    All other components within normal limits  URINALYSIS, ROUTINE W REFLEX MICROSCOPIC - Abnormal; Notable for the following:    Leukocytes, UA SMALL (*)    All other components within normal limits  URINE MICROSCOPIC-ADD ON - Abnormal; Notable for the following:    Squamous Epithelial / LPF FEW (*)    All other components within normal limits  URINE CULTURE  POCT I-STAT TROPONIN I   Imaging Review Dg Chest 2 View  12/10/2013   CLINICAL DATA:  Near syncope.  EXAM: CHEST  2 VIEW  COMPARISON:  06/29/2013, 03/23/2010.  FINDINGS: Cardiac silhouette normal in size, unchanged. Thoracic aorta mildly tortuous and atherosclerotic, unchanged. Hilar and mediastinal contours otherwise unremarkable. Stable chronic elevation of the right hemidiaphragm and chronic scar/atelectasis in the right lower lobe. Lungs otherwise clear. No localized airspace consolidation. No pleural effusions. No pneumothorax. Normal pulmonary vascularity. Degenerative changes and DISH involving the thoracic spine.  IMPRESSION: Stable chronic elevation of the right hemidiaphragm and chronic scar/atelectasis in the right lower lobe. No acute cardiopulmonary disease.   Electronically Signed   By: Evangeline Dakin M.D.   On: 12/10/2013 22:00    EKG Interpretation   None       MDM   1. Pre-syncope    80 year old female with a chief complaint presyncopal episode. Patient was standing up from the table felt  lightheaded did not pass out remembers the entire event. Patient denies any chest pain shortness breath abdominal pain fevers cough vomiting diarrhea.  Appears well but pale on exam. Emesis going on for quite some time. Patient with benign CBC CMP UA. istat trop negative.  History unremarkable as read by me. Patient appears well nontoxic we will discharge home she will follow up with her PCP we recommend Holter monitoring for 24 hours.   12:51 AM:  I have discussed the diagnosis/risks/treatment options with the patient and family and believe the pt to be eligible for discharge home to follow-up with PCP. We also discussed returning to the ED immediately if new or worsening sx occur. We discussed the sx which are most concerning (e.g., repeat episode) that necessitate immediate return. Medications administered to the patient during their visit and any new prescriptions provided to the patient are listed below.  Medications given during this visit Medications  sodium chloride 0.9 % bolus 500 mL (500 mLs Intravenous New Bag/Given 12/10/13 2208)    New Prescriptions   No medications on file       Deno Etienne, MD 12/11/13 [phone]

## 2013-12-11 NOTE — Discharge Instructions (Signed)
Follow up with your PCP, return for repeat episode.   Cardiac Event Monitoring A cardiac event monitor is a small recording device used to help detect abnormal heart rhythms (arrhythmias). The monitor is used to record heart rhythm when noticeable symptoms such as the following occur:  Fast heart beats (palpitations), such as heart racing or fluttering.  Dizziness.  Fainting or lightheadedness.  Unexplained weakness. The monitor is wired to two electrodes placed on your chest. Electrodes are flat, sticky disks that attach to your skin. The monitor can be worn for up to 30 days. You will wear the monitor at all times, except when bathing.  HOW TO USE YOUR CARDIAC EVENT MONITOR A technician will prepare your chest for the electrode placement. The technician will show you how to place the electrodes, how to work the monitor, and how to replace the batteries. Take time to practice using the monitor before you leave the office. Make sure you understand how to send the information from the monitor to your health care provider. This requires a telephone with a landline, not a cellphone. You need to:  Wear your monitor at all times, except when you are in water:  Do not get the monitor wet.  Take the monitor off when bathing. Do not swim or use a hot tub with it on.  Keep your skin clean. Do not put body lotion or moisturizer on your chest.  Change the electrodes daily or any time they stop sticking to your skin. You might need to use tape to keep them on.  It is possible that your skin under the electrodes could become irritated. To keep this from happening, try to put the electrodes in slightly different places on your chest. However, they must remain in the area under your left breast and in the upper right section of your chest.  Make sure the monitor is safely clipped to your clothing or in a location close to your body that your health care provider recommends.  Press the button to record  when you feel symptoms of heart trouble, such as dizziness, weakness, lightheadedness, palpitations, thumping, shortness of breath, unexplained weakness, or a fluttering or racing heart. The monitor is always on and records what happened slightly before you pressed the button, so do not worry about being too late to get good information.  Keep a diary of your activities, such as walking, doing chores, and taking medicine. It is especially important to note what you were doing when you pushed the button to record your symptoms. This will help your health care provider determine what might be contributing to your symptoms. The information stored in your monitor will be reviewed by your health care provider alongside your diary entries.  Send the recorded information as recommended by your health care provider. It is important to understand that it will take some time for your health care provider to process the results.  Change the batteries as recommended by your health care provider. SEEK IMMEDIATE MEDICAL CARE IF:   You have chest pain.  You have extreme difficulty breathing or shortness of breath.  You develop a very fast heartbeat that persists.  You develop dizziness that does not go away .  You faint or constantly feel you are about to faint. Document Released: 08/06/2008 Document Revised: 06/30/2013 Document Reviewed: 04/26/2013 [facility] Patient Information 2014 [place], [place].

## 2013-12-11 NOTE — ED Provider Notes (Signed)
I saw and evaluated the patient, reviewed the resident's note and I agree with the findings and plan.  EKG Interpretation    Date/Time:  Friday December 10 2013 20:34:01 EST Ventricular Rate:  68 PR Interval:  228 QRS Duration: 82 QT Interval:  396 QTC Calculation: 421 R Axis:   1 Text Interpretation:  Sinus rhythm Ventricular trigeminy Prolonged PR interval No significant change was found Confirmed by Inaaya Vellucci  MD, Rae Plotner ([id number]) on 12/11/2013 1:08:01 AM           Well appearing. Dc home. No syncope.    Hoy Morn, MD 12/11/13 [phone]

## 2013-12-12 LAB — URINE CULTURE: Colony Count: 40000

## 2013-12-14 ENCOUNTER — Encounter: Payer: Self-pay | Admitting: *Deleted

## 2013-12-14 ENCOUNTER — Encounter (INDEPENDENT_AMBULATORY_CARE_PROVIDER_SITE_OTHER): Payer: Medicare Other

## 2013-12-14 DIAGNOSIS — R002 Palpitations: Secondary | ICD-10-CM | POA: Diagnosis not present

## 2013-12-14 DIAGNOSIS — R55 Syncope and collapse: Secondary | ICD-10-CM

## 2013-12-14 NOTE — Progress Notes (Signed)
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   MRN: [id number] E cardio 24hr holter applied

## 2013-12-21 ENCOUNTER — Telehealth: Payer: Self-pay | Admitting: *Deleted

## 2013-12-21 DIAGNOSIS — H1045 Other chronic allergic conjunctivitis: Secondary | ICD-10-CM | POA: Diagnosis not present

## 2013-12-21 DIAGNOSIS — H40029 Open angle with borderline findings, high risk, unspecified eye: Secondary | ICD-10-CM | POA: Diagnosis not present

## 2013-12-21 NOTE — Telephone Encounter (Signed)
I placed call to pt to review monitor results. No answer and no voicemail. Will continue to try to reach pt

## 2013-12-23 ENCOUNTER — Other Ambulatory Visit: Payer: Self-pay | Admitting: Endocrinology

## 2013-12-23 NOTE — Telephone Encounter (Signed)
Left message to call back  

## 2013-12-23 NOTE — Telephone Encounter (Signed)
Spoke with pt's daughter Chong Sicilian) and reviewed monitor results with her.

## 2014-01-07 ENCOUNTER — Ambulatory Visit: Payer: Medicare Other | Admitting: Cardiovascular Disease

## 2014-01-12 ENCOUNTER — Ambulatory Visit (INDEPENDENT_AMBULATORY_CARE_PROVIDER_SITE_OTHER): Payer: Medicare Other | Admitting: Cardiovascular Disease

## 2014-01-12 ENCOUNTER — Encounter: Payer: Self-pay | Admitting: Cardiovascular Disease

## 2014-01-12 VITALS — BP 173/70 | HR 69 | Ht 62.0 in | Wt 124.0 lb

## 2014-01-12 DIAGNOSIS — I4891 Unspecified atrial fibrillation: Secondary | ICD-10-CM | POA: Diagnosis not present

## 2014-01-12 DIAGNOSIS — I2510 Atherosclerotic heart disease of native coronary artery without angina pectoris: Secondary | ICD-10-CM | POA: Diagnosis not present

## 2014-01-12 DIAGNOSIS — E785 Hyperlipidemia, unspecified: Secondary | ICD-10-CM

## 2014-01-12 DIAGNOSIS — I10 Essential (primary) hypertension: Secondary | ICD-10-CM | POA: Diagnosis not present

## 2014-01-12 MED ORDER — LISINOPRIL-HYDROCHLOROTHIAZIDE 20-12.5 MG PO TABS
2.0000 | ORAL_TABLET | Freq: Every day | ORAL | Status: DC
Start: 2014-01-12 — End: 2014-11-01

## 2014-01-12 NOTE — Patient Instructions (Signed)
Your physician wants you to follow-up in: 6 months.  You will receive a reminder letter in the mail two months in advance. If you don't receive a letter, please call our office to schedule the follow-up appointment.  Your physician recommends that you return for lab work in: 1 week--January 19, 2014.  The lab opens at 7:30 AM.    Your physician has recommended you make the following change in your medication:  Increase lisinopril-hydrochlorothiazide 20/12.5 mg to 2 tablets by mouth daily.

## 2014-01-12 NOTE — Progress Notes (Signed)
History of Present Illness: 80 yo female with history of CAD, atrial fibrillation, HTN, HLD, syncope who is here today for cardiac follow up. She has been followed in the past by Dr. Verl Blalock. Last cath April 2011 with bare metal stent patent in LAD, moderate disease in mid Circumflex, mild disease RCA.  She tells me that she feels well. No chest pain or SOB. No palpitations. She has some unsteadiness on her feet.   Primary Care Physician: Loanne Drilling  Last Lipid Profile:Lipid Panel     Component Value Date/Time   CHOL 165 11/26/2013 1122   TRIG 168.0* 11/26/2013 1122   HDL 58.20 11/26/2013 1122   CHOLHDL 3 11/26/2013 1122   VLDL 33.6 11/26/2013 1122   LDLCALC 73 11/26/2013 1122     Past Medical History  Diagnosis Date  . Arrhythmia     paroxysmal atrial fibrillation  . Coronary artery disease   . Hypertension   . Hyperlipidemia   . Edema   . GERD (gastroesophageal reflux disease)   . Anemia B twelve deficiency   . Dizziness - giddy   . Abdominal pain   . Iron deficiency anemia, unspecified   . Pernicious anemia   . Thoracic or lumbosacral neuritis or radiculitis, unspecified   . Symptomatic menopausal or female climacteric states   . Lumbago   . Benign neoplasm of colon   . Allergic rhinitis, cause unspecified   . Type II diabetes mellitus   . Degeneration of lumbar or lumbosacral intervertebral disc   . Arthritis     "arms and hands" (06/30/2013)    Past Surgical History  Procedure Laterality Date  . Back surgery    . Cholecystectomy    . Appendectomy  1974  . Abdominal hysterectomy  1974  . Carpal tunnel release Bilateral 1970's  . Cataract extraction, bilateral Bilateral   . Cardiac catheterization    . Coronary angioplasty with stent placement      "1" (06/30/2013  . Lumbar disc surgery      Current Outpatient Prescriptions  Medication Sig Dispense Refill  . aspirin 81 MG EC tablet Take 81 mg by mouth daily.        . bromocriptine (PARLODEL) 2.5 MG tablet Take 1  tablet (2.5 mg total) by mouth at bedtime.  30 tablet  11  . calcium carbonate (TUMS - DOSED IN MG ELEMENTAL CALCIUM) 500 MG chewable tablet Chew 1 tablet by mouth as needed for heartburn.      . levofloxacin (LEVAQUIN) 500 MG tablet Take 250 mg by mouth daily.      [name] lisinopril-hydrochlorothiazide (PRINZIDE,ZESTORETIC) 20-12.5 MG per tablet Take 1 tablet by mouth daily.  30 tablet  11  . loratadine-pseudoephedrine (CLARITIN-D 24-HOUR) 10-240 MG per 24 hr tablet Take 1 tablet by mouth. 1 daily as needed for congestion       . metFORMIN (GLUCOPHAGE-XR) 500 MG 24 hr tablet TAKE ONE TABLET BY MOUTH TWICE DAILY  60 tablet  2  . metoprolol succinate (TOPROL-XL) 50 MG 24 hr tablet Take 1 tablet (50 mg total) by mouth daily. Take with or immediately following a meal.  30 tablet  11  . nateglinide (STARLIX) 60 MG tablet Take 1 tablet (60 mg total) by mouth 3 (three) times daily with meals.  90 tablet  11  . nitroGLYCERIN (NITROSTAT) 0.4 MG SL tablet Place 0.4 mg under the tongue every 5 (five) minutes as needed.        [name] omeprazole (PRILOSEC) 40 MG capsule  Take 1 capsule (40 mg total) by mouth daily.  30 capsule  11  . pravastatin (PRAVACHOL) 40 MG tablet Take 1 tablet (40 mg total) by mouth at bedtime.  30 tablet  11  . sitaGLIPtin (JANUVIA) 100 MG tablet Take 1 tablet (100 mg total) by mouth daily.  90 tablet  1   No current facility-administered medications for this visit.    Allergies  Allergen Reactions  . Penicillins Swelling    REACTION: swelling of joints  . Sulfonamide Derivatives Other (See Comments)    REACTION: "like my head was full of water"  . Celecoxib Itching and Rash  . Erythromycin Itching and Rash  . Nitrofurantoin Itching and Rash  . Piroxicam Other (See Comments)    unknown  . Povidone-Iodine Itching and Rash    History   Social History  . Marital Status: Widowed    Spouse Name: N/A    Number of Children: N/A  . Years of Education: N/A   Occupational History  .  Not on file.   Social History Main Topics  . Smoking status: Never Smoker   . Smokeless tobacco: Current User    Types: Snuff  . Alcohol Use: No  . Drug Use: No  . Sexual Activity: No   Other Topics Concern  . Not on file   Social History Narrative  . No narrative on file    No family history on file.  Review of Systems:  As stated in the HPI and otherwise negative.   BP 173/70  Pulse 69  Ht 5\' 2"  (1.575 m)  Wt 124 lb (56.246 kg)  BMI 22.67 kg/m2  Physical Examination: General: Well developed, well nourished, NAD HEENT: OP clear, mucus membranes moist SKIN: warm, dry. No rashes. Neuro: No focal deficits Musculoskeletal: Muscle strength 5/5 all ext Psychiatric: Mood and affect normal Neck: No JVD, no carotid bruits, no thyromegaly, no lymphadenopathy. Lungs:Clear bilaterally, no wheezes, rhonci, crackles Cardiovascular: Regular rate and rhythm. No murmurs, gallops or rubs. Abdomen:Soft. Bowel sounds present. Non-tender.  Extremities: No lower extremity edema. Pulses are 2 + in the bilateral DP/PT.  Assessment and Plan:   1. CAD: Stable. Continue ASA, beta blocker, statin.   2. Paroxysmal atrial fibrillation: Maintaining sinus rhythm. She is on ASA. She has not been started on anti-coagulation in the past due to fall risk.   3. HTN: BP elevated today. We will increase her Lisinopril/HCTZ to 40/25 mg daily.   4. Hyperlipdemia: Continue statin. Lipids controlled.

## 2014-01-19 ENCOUNTER — Other Ambulatory Visit (INDEPENDENT_AMBULATORY_CARE_PROVIDER_SITE_OTHER): Payer: Medicare Other

## 2014-01-19 DIAGNOSIS — I2510 Atherosclerotic heart disease of native coronary artery without angina pectoris: Secondary | ICD-10-CM | POA: Diagnosis not present

## 2014-01-19 DIAGNOSIS — I10 Essential (primary) hypertension: Secondary | ICD-10-CM

## 2014-01-19 LAB — BASIC METABOLIC PANEL
BUN: 24 mg/dL — ABNORMAL HIGH (ref 6–23)
CO2: 27 mEq/L (ref 19–32)
Calcium: 9.5 mg/dL (ref 8.4–10.5)
Chloride: 101 mEq/L (ref 96–112)
Creatinine, Ser: 1.2 mg/dL (ref 0.4–1.2)
GFR: 56.81 mL/min — ABNORMAL LOW (ref >60.00)
Glucose, Bld: 228 mg/dL — ABNORMAL HIGH (ref 70–99)
Potassium: 3.8 mEq/L (ref 3.5–5.1)
Sodium: 133 mEq/L — ABNORMAL LOW (ref 135–145)

## 2014-02-11 ENCOUNTER — Ambulatory Visit: Payer: Medicare Other | Admitting: Cardiovascular Disease

## 2014-02-22 ENCOUNTER — Ambulatory Visit: Payer: Medicare Other | Admitting: Cardiovascular Disease

## 2014-02-25 DIAGNOSIS — E119 Type 2 diabetes mellitus without complications: Secondary | ICD-10-CM | POA: Diagnosis not present

## 2014-02-25 DIAGNOSIS — H524 Presbyopia: Secondary | ICD-10-CM | POA: Diagnosis not present

## 2014-02-25 DIAGNOSIS — H409 Unspecified glaucoma: Secondary | ICD-10-CM | POA: Diagnosis not present

## 2014-02-25 DIAGNOSIS — H401290 Low-tension glaucoma, unspecified eye, stage unspecified: Secondary | ICD-10-CM | POA: Diagnosis not present

## 2014-03-07 ENCOUNTER — Encounter: Payer: Self-pay | Admitting: Endocrinology

## 2014-03-07 ENCOUNTER — Ambulatory Visit (INDEPENDENT_AMBULATORY_CARE_PROVIDER_SITE_OTHER): Payer: Medicare Other | Admitting: Endocrinology

## 2014-03-07 VITALS — BP 122/74 | HR 82 | Temp 97.5°F | Ht 62.0 in | Wt 123.0 lb

## 2014-03-07 DIAGNOSIS — D509 Iron deficiency anemia, unspecified: Secondary | ICD-10-CM

## 2014-03-07 DIAGNOSIS — I2510 Atherosclerotic heart disease of native coronary artery without angina pectoris: Secondary | ICD-10-CM | POA: Diagnosis not present

## 2014-03-07 DIAGNOSIS — M545 Low back pain, unspecified: Secondary | ICD-10-CM

## 2014-03-07 DIAGNOSIS — E119 Type 2 diabetes mellitus without complications: Secondary | ICD-10-CM

## 2014-03-07 LAB — IBC PANEL
Iron: 42 ug/dL (ref 42–145)
Saturation Ratios: 13.3 % — ABNORMAL LOW (ref 20.0–50.0)
Transferrin: 225.4 mg/dL (ref 212.0–360.0)

## 2014-03-07 LAB — CBC WITH DIFFERENTIAL/PLATELET
Basophils Absolute: 0 10*3/uL (ref 0.0–0.1)
Basophils Relative: 0.4 % (ref 0.0–3.0)
Eosinophils Absolute: 0.3 10*3/uL (ref 0.0–0.7)
Eosinophils Relative: 7 % — ABNORMAL HIGH (ref 0.0–5.0)
HCT: 33.8 % — ABNORMAL LOW (ref 36.0–46.0)
Hemoglobin: 11.1 g/dL — ABNORMAL LOW (ref 12.0–15.0)
Lymphocytes Relative: 41.7 % (ref 12.0–46.0)
Lymphs Abs: 2.1 10*3/uL (ref 0.7–4.0)
MCHC: 32.9 g/dL (ref 30.0–36.0)
MCV: 90.8 fl (ref 78.0–100.0)
Monocytes Absolute: 0.5 10*3/uL (ref 0.1–1.0)
Monocytes Relative: 11 % (ref 3.0–12.0)
Neutro Abs: 2 10*3/uL (ref 1.4–7.7)
Neutrophils Relative %: 39.9 % — ABNORMAL LOW (ref 43.0–77.0)
Platelets: 237 10*3/uL (ref 150.0–400.0)
RBC: 3.72 Mil/uL — ABNORMAL LOW (ref 3.87–5.11)
RDW: 13.8 % (ref 11.5–14.6)
WBC: 4.9 10*3/uL (ref 4.5–10.5)

## 2014-03-07 LAB — SEDIMENTATION RATE: Sed Rate: 36 mm/hr — ABNORMAL HIGH (ref 0–22)

## 2014-03-07 LAB — HEMOGLOBIN A1C: Hgb A1c MFr Bld: 7.6 % — ABNORMAL HIGH (ref 4.6–6.5)

## 2014-03-07 MED ORDER — TRAMADOL HCL 50 MG PO TABS: 50.0000 mg | ORAL_TABLET | Freq: Four times a day (QID) | ORAL | Status: DC | PRN

## 2014-03-07 NOTE — Progress Notes (Signed)
Subjective:    Patient ID: [name], female    DOB: 04/23/1925, 80 y.o.   MRN: [id number]  HPI Pt returns for f/u of type 2 DM (dx'ed 2006; she has mild if any neuropathy of the lower extremities, but she has associated CAD; she has never taken insulin).  Pt says the nateglinide "made me sick."  She cannot elaborate further.   Past Medical History  Diagnosis Date  . Arrhythmia     paroxysmal atrial fibrillation  . Coronary artery disease   . Hypertension   . Hyperlipidemia   . Edema   . GERD (gastroesophageal reflux disease)   . Anemia B twelve deficiency   . Dizziness - giddy   . Abdominal pain   . Iron deficiency anemia, unspecified   . Pernicious anemia   . Thoracic or lumbosacral neuritis or radiculitis, unspecified   . Symptomatic menopausal or female climacteric states   . Lumbago   . Benign neoplasm of colon   . Allergic rhinitis, cause unspecified   . Type II diabetes mellitus   . Degeneration of lumbar or lumbosacral intervertebral disc   . Arthritis     "arms and hands" (06/30/2013)    Past Surgical History  Procedure Laterality Date  . Back surgery    . Cholecystectomy    . Appendectomy  1974  . Abdominal hysterectomy  1974  . Carpal tunnel release Bilateral 1970's  . Cataract extraction, bilateral Bilateral   . Cardiac catheterization    . Coronary angioplasty with stent placement      "1" (06/30/2013  . Lumbar disc surgery      History   Social History  . Marital Status: Widowed    Spouse Name: N/A    Number of Children: N/A  . Years of Education: N/A   Occupational History  . Not on file.   Social History Main Topics  . Smoking status: Never Smoker   . Smokeless tobacco: Current User    Types: Snuff  . Alcohol Use: No  . Drug Use: No  . Sexual Activity: No   Other Topics Concern  . Not on file   Social History Narrative  . No narrative on file    Current Outpatient Prescriptions on File Prior to Visit  Medication Sig  Dispense Refill  . aspirin 81 MG EC tablet Take 81 mg by mouth daily.        . bromocriptine (PARLODEL) 2.5 MG tablet Take 1 tablet (2.5 mg total) by mouth at bedtime.  30 tablet  11  . calcium carbonate (TUMS - DOSED IN MG ELEMENTAL CALCIUM) 500 MG chewable tablet Chew 1 tablet by mouth as needed for heartburn.      [name] lisinopril-hydrochlorothiazide (PRINZIDE,ZESTORETIC) 20-12.5 MG per tablet Take 2 tablets by mouth daily.  60 tablet  11  . loratadine-pseudoephedrine (CLARITIN-D 24-HOUR) 10-240 MG per 24 hr tablet Take 1 tablet by mouth. 1 daily as needed for congestion       . metFORMIN (GLUCOPHAGE-XR) 500 MG 24 hr tablet TAKE ONE TABLET BY MOUTH TWICE DAILY  60 tablet  2  . metoprolol succinate (TOPROL-XL) 50 MG 24 hr tablet Take 1 tablet (50 mg total) by mouth daily. Take with or immediately following a meal.  30 tablet  11  . nitroGLYCERIN (NITROSTAT) 0.4 MG SL tablet Place 0.4 mg under the tongue every 5 (five) minutes as needed.        [name] omeprazole (PRILOSEC) 40 MG capsule Take 1 capsule (40 mg  total) by mouth daily.  30 capsule  11  . pravastatin (PRAVACHOL) 40 MG tablet Take 1 tablet (40 mg total) by mouth at bedtime.  30 tablet  11  . sitaGLIPtin (JANUVIA) 100 MG tablet Take 1 tablet (100 mg total) by mouth daily.  90 tablet  1   No current facility-administered medications on file prior to visit.    Allergies  Allergen Reactions  . Penicillins Swelling    REACTION: swelling of joints  . Sulfonamide Derivatives Other (See Comments)    REACTION: "like my head was full of water"  . Celecoxib Itching and Rash  . Erythromycin Itching and Rash  . Nitrofurantoin Itching and Rash  . Piroxicam Other (See Comments)    unknown  . Povidone-Iodine Itching and Rash    No family history on file.  BP 122/74  Pulse 82  Temp(Src) 97.5 F (36.4 C) (Oral)  Ht 5\' 2"  (1.575 m)  Wt 123 lb (55.792 kg)  BMI 22.49 kg/m2  SpO2 92%  Review of Systems Pt has had low-back pain x many years.  It  is worse x a few days.  No new injury.  Denies brbpr.     Objective:   Physical Exam VITAL SIGNS:  See vs page GENERAL: no distress Spine: nontender  Lab Results  Component Value Date   HGBA1C 7.6* 03/07/2014   Lab Results  Component Value Date   WBC 4.9 03/07/2014   HGB 11.1* 03/07/2014   HCT 33.8* 03/07/2014   MCV 90.8 03/07/2014   PLT 237.0 03/07/2014       Assessment & Plan:  DM: Needs increased rx, if it can be done with a regimen that avoids or minimizes hypoglycemia.  However, given her intolerance of starlix, we'll hold-off on any other rx for now.  fe-deficiency anemia: she needs increased rx, but w/u is not indicated at this advanced age.   Low-back pain, recurrent.

## 2014-03-07 NOTE — Patient Instructions (Addendum)
blood tests are being requested for you today.  We'll contact you with results. If your blood sugar is high, we can add "repaglinide." Here is a prescription for a pain pill.  Please come back for a follow-up appointment in 3 months.

## 2014-03-21 DIAGNOSIS — H409 Unspecified glaucoma: Secondary | ICD-10-CM | POA: Diagnosis not present

## 2014-03-21 DIAGNOSIS — H401290 Low-tension glaucoma, unspecified eye, stage unspecified: Secondary | ICD-10-CM | POA: Diagnosis not present

## 2014-04-18 DIAGNOSIS — H409 Unspecified glaucoma: Secondary | ICD-10-CM | POA: Diagnosis not present

## 2014-04-18 DIAGNOSIS — H401290 Low-tension glaucoma, unspecified eye, stage unspecified: Secondary | ICD-10-CM | POA: Diagnosis not present

## 2014-05-05 ENCOUNTER — Other Ambulatory Visit: Payer: Self-pay | Admitting: Endocrinology

## 2014-05-06 DIAGNOSIS — N390 Urinary tract infection, site not specified: Secondary | ICD-10-CM | POA: Diagnosis not present

## 2014-05-06 DIAGNOSIS — R319 Hematuria, unspecified: Secondary | ICD-10-CM | POA: Diagnosis not present

## 2014-05-12 ENCOUNTER — Other Ambulatory Visit: Payer: Self-pay | Admitting: Endocrinology

## 2014-05-16 DIAGNOSIS — R339 Retention of urine, unspecified: Secondary | ICD-10-CM | POA: Diagnosis not present

## 2014-05-16 DIAGNOSIS — R300 Dysuria: Secondary | ICD-10-CM | POA: Diagnosis not present

## 2014-05-16 DIAGNOSIS — R310 Gross hematuria: Secondary | ICD-10-CM | POA: Diagnosis not present

## 2014-05-16 DIAGNOSIS — N390 Urinary tract infection, site not specified: Secondary | ICD-10-CM | POA: Diagnosis not present

## 2014-05-17 DIAGNOSIS — R339 Retention of urine, unspecified: Secondary | ICD-10-CM | POA: Insufficient documentation

## 2014-06-13 ENCOUNTER — Other Ambulatory Visit: Payer: Self-pay | Admitting: Endocrinology

## 2014-06-14 ENCOUNTER — Other Ambulatory Visit: Payer: Self-pay

## 2014-06-14 DIAGNOSIS — Z1231 Encounter for screening mammogram for malignant neoplasm of breast: Secondary | ICD-10-CM

## 2014-06-20 ENCOUNTER — Ambulatory Visit (INDEPENDENT_AMBULATORY_CARE_PROVIDER_SITE_OTHER): Payer: Medicare Other | Admitting: Endocrinology

## 2014-06-20 ENCOUNTER — Encounter: Payer: Self-pay | Admitting: Endocrinology

## 2014-06-20 VITALS — BP 136/78 | HR 96 | Temp 97.8°F | Ht 62.0 in | Wt 119.0 lb

## 2014-06-20 DIAGNOSIS — E119 Type 2 diabetes mellitus without complications: Secondary | ICD-10-CM

## 2014-06-20 DIAGNOSIS — D509 Iron deficiency anemia, unspecified: Secondary | ICD-10-CM

## 2014-06-20 DIAGNOSIS — E871 Hypo-osmolality and hyponatremia: Secondary | ICD-10-CM | POA: Diagnosis not present

## 2014-06-20 DIAGNOSIS — Z79899 Other long term (current) drug therapy: Secondary | ICD-10-CM | POA: Diagnosis not present

## 2014-06-20 DIAGNOSIS — I2510 Atherosclerotic heart disease of native coronary artery without angina pectoris: Secondary | ICD-10-CM | POA: Diagnosis not present

## 2014-06-20 LAB — CBC WITH DIFFERENTIAL/PLATELET
Basophils Absolute: 0 10*3/uL (ref 0.0–0.1)
Basophils Relative: 0.5 % (ref 0.0–3.0)
Eosinophils Absolute: 0.2 10*3/uL (ref 0.0–0.7)
Eosinophils Relative: 3.7 % (ref 0.0–5.0)
HCT: 31 % — ABNORMAL LOW (ref 36.0–46.0)
Hemoglobin: 10.1 g/dL — ABNORMAL LOW (ref 12.0–15.0)
Lymphocytes Relative: 36.4 % (ref 12.0–46.0)
Lymphs Abs: 1.6 10*3/uL (ref 0.7–4.0)
MCHC: 32.6 g/dL (ref 30.0–36.0)
MCV: 90.2 fl (ref 78.0–100.0)
Monocytes Absolute: 0.4 10*3/uL (ref 0.1–1.0)
Monocytes Relative: 9.8 % (ref 3.0–12.0)
Neutro Abs: 2.2 10*3/uL (ref 1.4–7.7)
Neutrophils Relative %: 49.6 % (ref 43.0–77.0)
Platelets: 227 10*3/uL (ref 150.0–400.0)
RBC: 3.44 Mil/uL — ABNORMAL LOW (ref 3.87–5.11)
RDW: 15.1 % (ref 11.5–15.5)
WBC: 4.5 10*3/uL (ref 4.0–10.5)

## 2014-06-20 LAB — BASIC METABOLIC PANEL
BUN: 24 mg/dL — ABNORMAL HIGH (ref 6–23)
CO2: 25 mEq/L (ref 19–32)
Calcium: 9.7 mg/dL (ref 8.4–10.5)
Chloride: 105 mEq/L (ref 96–112)
Creatinine, Ser: 1 mg/dL (ref 0.4–1.2)
GFR: 65.1 mL/min (ref >60.00)
Glucose, Bld: 118 mg/dL — ABNORMAL HIGH (ref 70–99)
Potassium: 3.9 mEq/L (ref 3.5–5.1)
Sodium: 137 mEq/L (ref 135–145)

## 2014-06-20 LAB — HEMOGLOBIN A1C: Hgb A1c MFr Bld: 7.4 % — ABNORMAL HIGH (ref 4.6–6.5)

## 2014-06-20 LAB — IBC PANEL
Iron: 48 ug/dL (ref 42–145)
Saturation Ratios: 16.5 % — ABNORMAL LOW (ref 20.0–50.0)
Transferrin: 208 mg/dL — ABNORMAL LOW (ref 212.0–360.0)

## 2014-06-20 NOTE — Patient Instructions (Addendum)
blood tests are being requested for you today.  We'll contact you with results.   check your blood sugar once a day.  vary the time of day when you check, between before the 3 meals, and at bedtime.  also check if you have symptoms of your blood sugar being too high or too low.  please keep a record of the readings and bring it to your next appointment here.  You can write it on any piece of paper.  please call us sooner if your blood sugar goes below 70, or if you have a lot of readings over 200.   Please come back for a follow-up appointment in 3 months.   

## 2014-06-20 NOTE — Progress Notes (Signed)
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   MRN: [id number]  HPI Pt returns for f/u of type 2 DM (dx'ed 2006; she has mild if any neuropathy of the lower extremities, but she has associated CAD; she has never taken insulin; she did not tolerate nateglinide or [place]).  no cbg record, but states cbg's are well-controlled.  She takes meds intermittently. She take fe 1/day.   Past Medical History  Diagnosis Date  . Arrhythmia     paroxysmal atrial fibrillation  . Coronary artery disease   . Hypertension   . Hyperlipidemia   . Edema   . GERD (gastroesophageal reflux disease)   . Anemia B twelve deficiency   . Dizziness - giddy   . Abdominal pain   . Iron deficiency anemia, unspecified   . Pernicious anemia   . Thoracic or lumbosacral neuritis or radiculitis, unspecified   . Symptomatic menopausal or female climacteric states   . Lumbago   . Benign neoplasm of colon   . Allergic rhinitis, cause unspecified   . Type II diabetes mellitus   . Degeneration of lumbar or lumbosacral intervertebral disc   . Arthritis     "arms and hands" (06/30/2013)    Past Surgical History  Procedure Laterality Date  . Back surgery    . Cholecystectomy    . Appendectomy  1974  . Abdominal hysterectomy  1974  . Carpal tunnel release Bilateral 1970's  . Cataract extraction, bilateral Bilateral   . Cardiac catheterization    . Coronary angioplasty with stent placement      "1" (06/30/2013  . Lumbar disc surgery      History   Social History  . Marital Status: Widowed    Spouse Name: N/A    Number of Children: N/A  . Years of Education: N/A   Occupational History  . Not on file.   Social History Main Topics  . Smoking status: Never Smoker   . Smokeless tobacco: Current User    Types: Snuff  . Alcohol Use: No  . Drug Use: No  . Sexual Activity: No   Other Topics Concern  . Not on file   Social History Narrative  . No narrative on file     Current Outpatient Prescriptions on File Prior to Visit  Medication Sig Dispense Refill  . aspirin 81 MG EC tablet Take 81 mg by mouth daily.        . bromocriptine (PARLODEL) 2.5 MG tablet Take 1 tablet (2.5 mg total) by mouth at bedtime.  30 tablet  11  . calcium carbonate (TUMS - DOSED IN MG ELEMENTAL CALCIUM) 500 MG chewable tablet Chew 1 tablet by mouth as needed for heartburn.      [name] FERREX 150 150 MG capsule TAKE 1 CAPSULE (150 MG TOTAL) BY MOUTH 2 (TWO) TIMES DAILY.  60 capsule  4  . lisinopril-hydrochlorothiazide (PRINZIDE,ZESTORETIC) 20-12.5 MG per tablet Take 2 tablets by mouth daily.  60 tablet  11  . loratadine-pseudoephedrine (CLARITIN-D 24-HOUR) 10-240 MG per 24 hr tablet Take 1 tablet by mouth. 1 daily as needed for congestion       . metFORMIN (GLUCOPHAGE-XR) 500 MG 24 hr tablet TAKE ONE TABLET BY MOUTH TWICE DAILY  60 tablet  0  . metoprolol succinate (TOPROL-XL) 50 MG 24 hr tablet Take 1 tablet (50 mg total) by mouth daily. Take with or immediately following a meal.  30 tablet  11  .  nitroGLYCERIN (NITROSTAT) 0.4 MG SL tablet Place 0.4 mg under the tongue every 5 (five) minutes as needed.        [name] omeprazole (PRILOSEC) 40 MG capsule Take 1 capsule (40 mg total) by mouth daily.  30 capsule  11  . pravastatin (PRAVACHOL) 40 MG tablet Take 1 tablet (40 mg total) by mouth at bedtime.  30 tablet  11  . traMADol (ULTRAM) 50 MG tablet Take 1 tablet (50 mg total) by mouth every 6 (six) hours as needed.  30 tablet  0   No current facility-administered medications on file prior to visit.    Allergies  Allergen Reactions  . Penicillins Swelling    REACTION: swelling of joints  . Sulfonamide Derivatives Other (See Comments)    REACTION: "like my head was full of water"  . Celecoxib Itching and Rash  . Erythromycin Itching and Rash  . Nitrofurantoin Itching and Rash  . Piroxicam Other (See Comments)    unknown  . Povidone-Iodine Itching and Rash    No family history on  file.  BP 136/78  Pulse 96  Temp(Src) 97.8 F (36.6 C) (Oral)  Ht 5\' 2"  (1.575 m)  Wt 119 lb (53.978 kg)  BMI 21.76 kg/m2  SpO2 98%    Review of Systems She denies hypoglycemia.  She has fatigue.     Objective:   Physical Exam VITAL SIGNS:  See vs page GENERAL: no distress Pulses: dorsalis pedis intact bilat.  Feet: no deformity. feet are of normal color and temp. no edema. There is bilateral onychomycosis  Skin: no ulcer on the feet.  Neuro: sensation is intact to touch on the feet.     Lab Results  Component Value Date   HGBA1C 7.4* 06/20/2014   Lab Results  Component Value Date   WBC 4.5 06/20/2014   HGB 10.1* 06/20/2014   HCT 31.0* 06/20/2014   MCV 90.2 06/20/2014   PLT 227.0 06/20/2014   Lab Results  Component Value Date   CREATININE 1.0 06/20/2014   BUN 24* 06/20/2014   NA 137 06/20/2014   K 3.9 06/20/2014   CL 105 06/20/2014   CO2 25 06/20/2014       Assessment & Plan:  Anemia: moderate exacerbation. DM: mild exacerbation. Noncompliance with cbg recording and medication taking: she is advised to take meds as rx'ed Renal insuff: stable.   Patient is advised the following: Patient Instructions  blood tests are being requested for you today.  We'll contact you with results. check your blood sugar once a day.  vary the time of day when you check, between before the 3 meals, and at bedtime.  also check if you have symptoms of your blood sugar being too high or too low.  please keep a record of the readings and bring it to your next appointment here.  You can write it on any piece of paper.  please call us sooner if your blood sugar goes below 70, or if you have a lot of readings over 200. Please come back for a follow-up appointment in 3 months.

## 2014-06-21 ENCOUNTER — Ambulatory Visit: Payer: Medicare Other

## 2014-06-23 ENCOUNTER — Ambulatory Visit: Payer: Medicare Other

## 2014-06-30 ENCOUNTER — Ambulatory Visit
Admission: RE | Admit: 2014-06-30 | Discharge: 2014-06-30 | Disposition: A | Discharge status: Home / Self Care | Payer: Medicare Other | Source: Ambulatory Visit

## 2014-06-30 DIAGNOSIS — Z1231 Encounter for screening mammogram for malignant neoplasm of breast: Secondary | ICD-10-CM | POA: Diagnosis not present

## 2014-07-18 ENCOUNTER — Other Ambulatory Visit: Payer: Self-pay | Admitting: Endocrinology

## 2014-07-27 ENCOUNTER — Ambulatory Visit (INDEPENDENT_AMBULATORY_CARE_PROVIDER_SITE_OTHER): Payer: Medicare Other

## 2014-07-27 DIAGNOSIS — Z23 Encounter for immunization: Secondary | ICD-10-CM

## 2014-08-01 ENCOUNTER — Other Ambulatory Visit: Payer: Self-pay | Admitting: Endocrinology

## 2014-08-12 DIAGNOSIS — R300 Dysuria: Secondary | ICD-10-CM | POA: Diagnosis not present

## 2014-08-12 DIAGNOSIS — N390 Urinary tract infection, site not specified: Secondary | ICD-10-CM | POA: Diagnosis not present

## 2014-08-12 DIAGNOSIS — R310 Gross hematuria: Secondary | ICD-10-CM | POA: Diagnosis not present

## 2014-08-12 DIAGNOSIS — R339 Retention of urine, unspecified: Secondary | ICD-10-CM | POA: Diagnosis not present

## 2014-08-17 ENCOUNTER — Other Ambulatory Visit: Payer: Self-pay | Admitting: Endocrinology

## 2014-08-25 DIAGNOSIS — E119 Type 2 diabetes mellitus without complications: Secondary | ICD-10-CM | POA: Diagnosis not present

## 2014-08-25 DIAGNOSIS — H02839 Dermatochalasis of unspecified eye, unspecified eyelid: Secondary | ICD-10-CM | POA: Diagnosis not present

## 2014-08-25 DIAGNOSIS — H401231 Low-tension glaucoma, bilateral, mild stage: Secondary | ICD-10-CM | POA: Diagnosis not present

## 2014-08-25 DIAGNOSIS — H524 Presbyopia: Secondary | ICD-10-CM | POA: Diagnosis not present

## 2014-08-31 ENCOUNTER — Ambulatory Visit (INDEPENDENT_AMBULATORY_CARE_PROVIDER_SITE_OTHER): Payer: Medicare Other | Admitting: Cardiovascular Disease

## 2014-08-31 ENCOUNTER — Encounter: Payer: Self-pay | Admitting: Cardiovascular Disease

## 2014-08-31 VITALS — BP 144/80 | HR 84 | Ht 62.0 in | Wt 120.0 lb

## 2014-08-31 DIAGNOSIS — E785 Hyperlipidemia, unspecified: Secondary | ICD-10-CM | POA: Diagnosis not present

## 2014-08-31 DIAGNOSIS — I10 Essential (primary) hypertension: Secondary | ICD-10-CM

## 2014-08-31 DIAGNOSIS — I480 Paroxysmal atrial fibrillation: Secondary | ICD-10-CM

## 2014-08-31 DIAGNOSIS — I2510 Atherosclerotic heart disease of native coronary artery without angina pectoris: Secondary | ICD-10-CM | POA: Diagnosis not present

## 2014-08-31 NOTE — Patient Instructions (Signed)
Your physician wants you to follow-up in:  6 months. You will receive a reminder letter in the mail two months in advance. If you don't receive a letter, please call our office to schedule the follow-up appointment.   

## 2014-08-31 NOTE — Progress Notes (Signed)
History of Present Illness: 80 yo female with history of CAD, atrial fibrillation, HTN, HLD, syncope who is here today for cardiac follow up. She has been followed in the past by Dr. Verl Blalock. Last cath April 2011 with bare metal stent patent in LAD, moderate disease in mid Circumflex, mild disease RCA. BP elevated at last visit here March 2015 and Lisinopril/HCTZ was increased.   She tells me that she feels well. No chest pain or SOB. No palpitations. She has some unsteadiness on her feet.   Primary Care Physician: Loanne Drilling  Last Lipid Profile:Lipid Panel     Component Value Date/Time   CHOL 165 11/26/2013 1122   TRIG 168.0* 11/26/2013 1122   HDL 58.20 11/26/2013 1122   CHOLHDL 3 11/26/2013 1122   VLDL 33.6 11/26/2013 1122   LDLCALC 73 11/26/2013 1122     Past Medical History  Diagnosis Date  . Arrhythmia     paroxysmal atrial fibrillation  . Coronary artery disease   . Hypertension   . Hyperlipidemia   . Edema   . GERD (gastroesophageal reflux disease)   . Anemia B twelve deficiency   . Dizziness - giddy   . Abdominal pain   . Iron deficiency anemia, unspecified   . Pernicious anemia   . Thoracic or lumbosacral neuritis or radiculitis, unspecified   . Symptomatic menopausal or female climacteric states   . Lumbago   . Benign neoplasm of colon   . Allergic rhinitis, cause unspecified   . Type II diabetes mellitus   . Degeneration of lumbar or lumbosacral intervertebral disc   . Arthritis     "arms and hands" (06/30/2013)    Past Surgical History  Procedure Laterality Date  . Back surgery    . Cholecystectomy    . Appendectomy  1974  . Abdominal hysterectomy  1974  . Carpal tunnel release Bilateral 1970's  . Cataract extraction, bilateral Bilateral   . Cardiac catheterization    . Coronary angioplasty with stent placement      "1" (06/30/2013  . Lumbar disc surgery      Current Outpatient Prescriptions  Medication Sig Dispense Refill  . aspirin 81 MG EC tablet  Take 81 mg by mouth daily.        . bromocriptine (PARLODEL) 2.5 MG tablet Take 1 tablet (2.5 mg total) by mouth at bedtime.  30 tablet  11  . calcium carbonate (TUMS - DOSED IN MG ELEMENTAL CALCIUM) 500 MG chewable tablet Chew 1 tablet by mouth as needed for heartburn.      [name] FERREX 150 150 MG capsule TAKE 1 CAPSULE (150 MG TOTAL) BY MOUTH 2 (TWO) TIMES DAILY.  60 capsule  4  . lisinopril-hydrochlorothiazide (PRINZIDE,ZESTORETIC) 20-12.5 MG per tablet Take 2 tablets by mouth daily.  60 tablet  11  . loratadine-pseudoephedrine (CLARITIN-D 24-HOUR) 10-240 MG per 24 hr tablet Take 1 tablet by mouth. 1 daily as needed for congestion       . metFORMIN (GLUCOPHAGE-XR) 500 MG 24 hr tablet TAKE ONE TABLET BY MOUTH TWICE DAILY  60 tablet  0  . metoprolol succinate (TOPROL-XL) 50 MG 24 hr tablet Take 1 tablet (50 mg total) by mouth daily. Take with or immediately following a meal.  30 tablet  11  . nitroGLYCERIN (NITROSTAT) 0.4 MG SL tablet Place 0.4 mg under the tongue every 5 (five) minutes as needed.        [name] omeprazole (PRILOSEC) 40 MG capsule TAKE 1 CAPSULE (40 MG TOTAL)  BY MOUTH DAILY.  30 capsule  11  . pravastatin (PRAVACHOL) 40 MG tablet Take 1 tablet (40 mg total) by mouth at bedtime.  30 tablet  11  . traMADol (ULTRAM) 50 MG tablet Take 1 tablet (50 mg total) by mouth every 6 (six) hours as needed.  30 tablet  0   No current facility-administered medications for this visit.    Allergies  Allergen Reactions  . Penicillins Swelling    REACTION: swelling of joints  . Sulfonamide Derivatives Other (See Comments)    REACTION: "like my head was full of water"  . Albumin (Human)   . Clindamycin   . Iodides   . Iodinated Diagnostic Agents   . Nitrofuran Derivatives   . Sulfa Antibiotics   . Celecoxib Itching and Rash  . Erythromycin Itching and Rash  . Nitrofurantoin Itching and Rash  . Piroxicam Other (See Comments)    unknown  . Povidone-Iodine Itching and Rash    History   Social  History  . Marital Status: Widowed    Spouse Name: N/A    Number of Children: N/A  . Years of Education: N/A   Occupational History  . Not on file.   Social History Main Topics  . Smoking status: Never Smoker   . Smokeless tobacco: Current User    Types: Snuff  . Alcohol Use: No  . Drug Use: No  . Sexual Activity: No   Other Topics Concern  . Not on file   Social History Narrative  . No narrative on file    No family history on file.  Review of Systems:  As stated in the HPI and otherwise negative.   BP 144/80  Pulse 84  Ht 5\' 2"  (1.575 m)  Wt 120 lb (54.432 kg)  BMI 21.94 kg/m2  Physical Examination: General: Well developed, well nourished, NAD HEENT: OP clear, mucus membranes moist SKIN: warm, dry. No rashes. Neuro: No focal deficits Musculoskeletal: Muscle strength 5/5 all ext Psychiatric: Mood and affect normal Neck: No JVD, no carotid bruits, no thyromegaly, no lymphadenopathy. Lungs:Clear bilaterally, no wheezes, rhonci, crackles Cardiovascular: Regular rate and rhythm. No murmurs, gallops or rubs. Abdomen:Soft. Bowel sounds present. Non-tender.  Extremities: No lower extremity edema. Pulses are 2 + in the bilateral DP/PT.  EKG: Sinus, rate 84 bpm. 1st degree AV block. Poor R wave progression precordial leads.   Assessment and Plan:   1. CAD: Stable. Continue ASA, beta blocker, statin.   2. Paroxysmal atrial fibrillation: Maintaining sinus rhythm. She is on ASA. She has not been started on anti-coagulation in the past due to fall risk.   3. HTN: BP controlled. No changes today.   4. Hyperlipdemia: Continue statin. Lipids controlled.

## 2014-09-05 ENCOUNTER — Other Ambulatory Visit: Payer: Self-pay | Admitting: Cardiovascular Disease

## 2014-09-19 ENCOUNTER — Other Ambulatory Visit: Payer: Self-pay | Admitting: Cardiovascular Disease

## 2014-09-19 ENCOUNTER — Other Ambulatory Visit: Payer: Self-pay | Admitting: Endocrinology

## 2014-09-26 DIAGNOSIS — K5790 Diverticulosis of intestine, part unspecified, without perforation or abscess without bleeding: Secondary | ICD-10-CM | POA: Diagnosis not present

## 2014-09-26 DIAGNOSIS — N816 Rectocele: Secondary | ICD-10-CM | POA: Diagnosis not present

## 2014-09-26 DIAGNOSIS — D414 Neoplasm of uncertain behavior of bladder: Secondary | ICD-10-CM | POA: Diagnosis not present

## 2014-09-26 DIAGNOSIS — K7689 Other specified diseases of liver: Secondary | ICD-10-CM | POA: Diagnosis not present

## 2014-09-26 DIAGNOSIS — N390 Urinary tract infection, site not specified: Secondary | ICD-10-CM | POA: Diagnosis not present

## 2014-09-26 DIAGNOSIS — R310 Gross hematuria: Secondary | ICD-10-CM | POA: Diagnosis not present

## 2014-10-18 DIAGNOSIS — Z01419 Encounter for gynecological examination (general) (routine) without abnormal findings: Secondary | ICD-10-CM | POA: Diagnosis not present

## 2014-10-18 DIAGNOSIS — Z0000 Encounter for general adult medical examination without abnormal findings: Secondary | ICD-10-CM | POA: Diagnosis not present

## 2014-10-18 DIAGNOSIS — R300 Dysuria: Secondary | ICD-10-CM | POA: Diagnosis not present

## 2014-10-20 ENCOUNTER — Other Ambulatory Visit: Payer: Self-pay | Admitting: Endocrinology

## 2014-10-24 IMAGING — CR DG CHEST 2V
1 series · 1 of 1 positions shown · non-contrast
Comparison: 06/29/2013, 03/23/2010.

CLINICAL DATA: Near syncope.

EXAM:
CHEST  2 VIEW

[view not recorded]
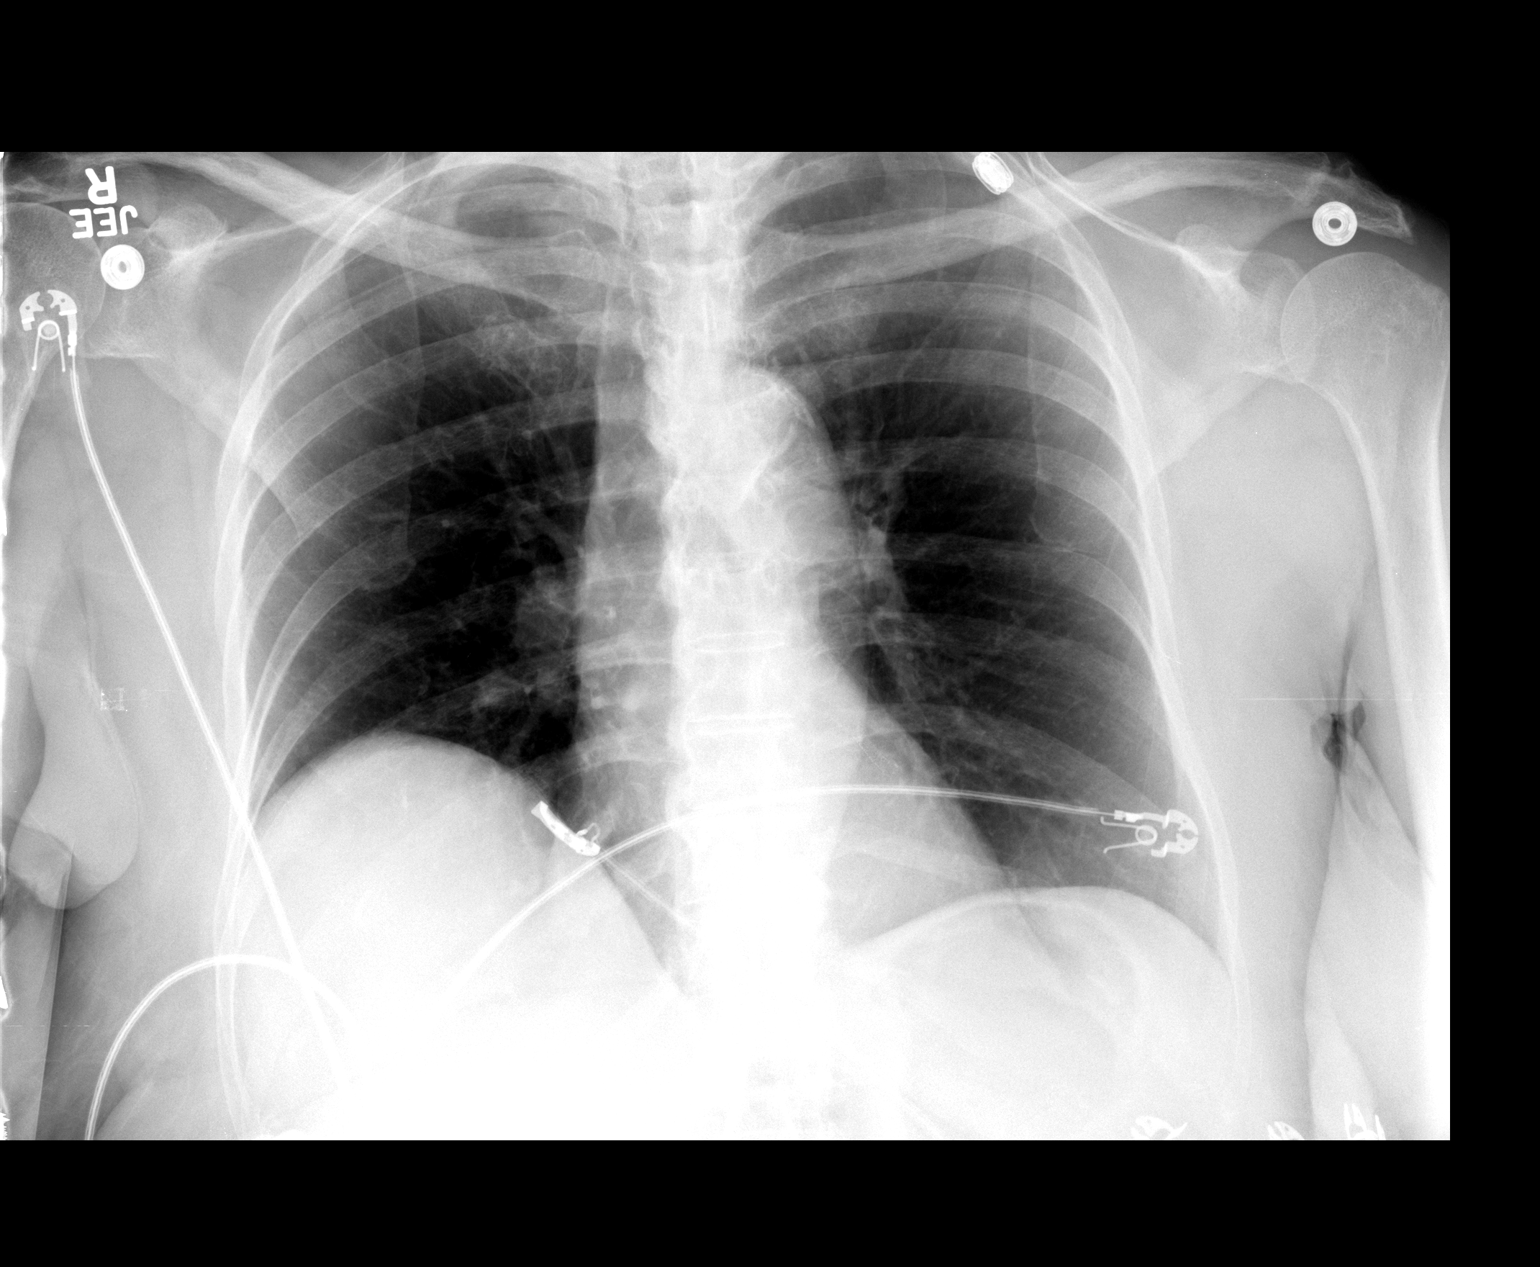

[1 of 1 positions shown; findings below may reference images not displayed]

FINDINGS: Cardiac silhouette normal in size, unchanged. Thoracic aorta mildly
tortuous and atherosclerotic, unchanged. Hilar and mediastinal
contours otherwise unremarkable. Stable chronic elevation of the
right hemidiaphragm and chronic scar/atelectasis in the right lower
lobe. Lungs otherwise clear. No localized airspace consolidation. No
pleural effusions. No pneumothorax. Normal pulmonary vascularity.
Degenerative changes and DISH involving the thoracic spine.
IMPRESSION: Stable chronic elevation of the right hemidiaphragm and chronic
scar/atelectasis in the right lower lobe. No acute cardiopulmonary
disease.

## 2014-10-31 ENCOUNTER — Observation Stay (HOSPITAL_COMMUNITY)
Admission: EM | Admit: 2014-10-31 | Discharge: 2014-11-01 | Disposition: A | Discharge status: Home / Self Care | Payer: Medicare Other | Attending: Internal Medicine | Admitting: Internal Medicine

## 2014-10-31 ENCOUNTER — Emergency Department (HOSPITAL_COMMUNITY): Payer: Medicare Other

## 2014-10-31 ENCOUNTER — Encounter (HOSPITAL_COMMUNITY): Payer: Self-pay | Admitting: *Deleted

## 2014-10-31 DIAGNOSIS — N951 Menopausal and female climacteric states: Secondary | ICD-10-CM | POA: Diagnosis not present

## 2014-10-31 DIAGNOSIS — M1990 Unspecified osteoarthritis, unspecified site: Secondary | ICD-10-CM | POA: Insufficient documentation

## 2014-10-31 DIAGNOSIS — Z7982 Long term (current) use of aspirin: Secondary | ICD-10-CM | POA: Insufficient documentation

## 2014-10-31 DIAGNOSIS — D509 Iron deficiency anemia, unspecified: Secondary | ICD-10-CM | POA: Insufficient documentation

## 2014-10-31 DIAGNOSIS — I499 Cardiac arrhythmia, unspecified: Secondary | ICD-10-CM | POA: Insufficient documentation

## 2014-10-31 DIAGNOSIS — I2510 Atherosclerotic heart disease of native coronary artery without angina pectoris: Secondary | ICD-10-CM | POA: Diagnosis not present

## 2014-10-31 DIAGNOSIS — K219 Gastro-esophageal reflux disease without esophagitis: Secondary | ICD-10-CM | POA: Insufficient documentation

## 2014-10-31 DIAGNOSIS — Z880 Allergy status to penicillin: Secondary | ICD-10-CM | POA: Diagnosis not present

## 2014-10-31 DIAGNOSIS — R55 Syncope and collapse: Principal | ICD-10-CM | POA: Diagnosis present

## 2014-10-31 DIAGNOSIS — R404 Transient alteration of awareness: Secondary | ICD-10-CM | POA: Diagnosis not present

## 2014-10-31 DIAGNOSIS — I4891 Unspecified atrial fibrillation: Secondary | ICD-10-CM | POA: Diagnosis present

## 2014-10-31 DIAGNOSIS — M5137 Other intervertebral disc degeneration, lumbosacral region: Secondary | ICD-10-CM | POA: Diagnosis not present

## 2014-10-31 DIAGNOSIS — Z86010 Personal history of colonic polyps: Secondary | ICD-10-CM | POA: Diagnosis not present

## 2014-10-31 DIAGNOSIS — Z79899 Other long term (current) drug therapy: Secondary | ICD-10-CM | POA: Insufficient documentation

## 2014-10-31 DIAGNOSIS — E785 Hyperlipidemia, unspecified: Secondary | ICD-10-CM | POA: Insufficient documentation

## 2014-10-31 DIAGNOSIS — E119 Type 2 diabetes mellitus without complications: Secondary | ICD-10-CM | POA: Diagnosis not present

## 2014-10-31 DIAGNOSIS — I10 Essential (primary) hypertension: Secondary | ICD-10-CM | POA: Diagnosis present

## 2014-10-31 DIAGNOSIS — D510 Vitamin B12 deficiency anemia due to intrinsic factor deficiency: Secondary | ICD-10-CM | POA: Diagnosis not present

## 2014-10-31 DIAGNOSIS — D519 Vitamin B12 deficiency anemia, unspecified: Secondary | ICD-10-CM | POA: Diagnosis not present

## 2014-10-31 LAB — COMPREHENSIVE METABOLIC PANEL
ALT: 11 U/L (ref 0–35)
AST: 19 U/L (ref 0–37)
Albumin: 3.9 g/dL (ref 3.5–5.2)
Alkaline Phosphatase: 78 U/L (ref 39–117)
Anion gap: 16 — ABNORMAL HIGH (ref 5–15)
BUN: 36 mg/dL — ABNORMAL HIGH (ref 6–23)
CO2: 23 mEq/L (ref 19–32)
Calcium: 9.8 mg/dL (ref 8.4–10.5)
Chloride: 104 mEq/L (ref 96–112)
Creatinine, Ser: 1.1 mg/dL (ref 0.50–1.10)
GFR calc Af Amer: 51 mL/min — ABNORMAL LOW (ref >90)
GFR calc non Af Amer: 44 mL/min — ABNORMAL LOW (ref >90)
Glucose, Bld: 125 mg/dL — ABNORMAL HIGH (ref 70–99)
Potassium: 3.9 mEq/L (ref 3.7–5.3)
Sodium: 143 mEq/L (ref 137–147)
Total Bilirubin: 0.4 mg/dL (ref 0.3–1.2)
Total Protein: 7.1 g/dL (ref 6.0–8.3)

## 2014-10-31 LAB — URINE MICROSCOPIC-ADD ON

## 2014-10-31 LAB — CBC WITH DIFFERENTIAL/PLATELET
Basophils Absolute: 0 10*3/uL (ref 0.0–0.1)
Basophils Relative: 0 % (ref 0–1)
Eosinophils Absolute: 0.2 10*3/uL (ref 0.0–0.7)
Eosinophils Relative: 3 % (ref 0–5)
HCT: 29.7 % — ABNORMAL LOW (ref 36.0–46.0)
Hemoglobin: 9.8 g/dL — ABNORMAL LOW (ref 12.0–15.0)
Lymphocytes Relative: 31 % (ref 12–46)
Lymphs Abs: 1.6 10*3/uL (ref 0.7–4.0)
MCH: 29.4 pg (ref 26.0–34.0)
MCHC: 33 g/dL (ref 30.0–36.0)
MCV: 89.2 fL (ref 78.0–100.0)
Monocytes Absolute: 0.4 10*3/uL (ref 0.1–1.0)
Monocytes Relative: 7 % (ref 3–12)
Neutro Abs: 3.1 10*3/uL (ref 1.7–7.7)
Neutrophils Relative %: 59 % (ref 43–77)
Platelets: 211 10*3/uL (ref 150–400)
RBC: 3.33 MIL/uL — ABNORMAL LOW (ref 3.87–5.11)
RDW: 13.5 % (ref 11.5–15.5)
WBC: 5.3 10*3/uL (ref 4.0–10.5)

## 2014-10-31 LAB — URINALYSIS, ROUTINE W REFLEX MICROSCOPIC
Bilirubin Urine: NEGATIVE
Glucose, UA: NEGATIVE mg/dL
Hgb urine dipstick: NEGATIVE
Ketones, ur: NEGATIVE mg/dL
Nitrite: NEGATIVE
Protein, ur: NEGATIVE mg/dL
Specific Gravity, Urine: 1.019 (ref 1.005–1.030)
Urobilinogen, UA: 1 mg/dL (ref 0.0–1.0)
pH: 5 (ref 5.0–8.0)

## 2014-10-31 LAB — GLUCOSE, CAPILLARY: Glucose-Capillary: 164 mg/dL — ABNORMAL HIGH (ref 70–99)

## 2014-10-31 LAB — CBG MONITORING, ED: Glucose-Capillary: 120 mg/dL — ABNORMAL HIGH (ref 70–99)

## 2014-10-31 LAB — TROPONIN I: Troponin I: <0.3 ng/mL (ref <0.30)

## 2014-10-31 MED ORDER — ONDANSETRON HCL 4 MG PO TABS: 4.0000 mg | ORAL_TABLET | Freq: Four times a day (QID) | ORAL | Status: DC | PRN

## 2014-10-31 MED ORDER — METOPROLOL SUCCINATE ER 50 MG PO TB24
50.0000 mg | ORAL_TABLET | Freq: Every day | ORAL | Status: DC
  Administered 2014-11-01: 50 mg via ORAL
  Filled 2014-10-31: qty 1

## 2014-10-31 MED ORDER — ENOXAPARIN SODIUM 30 MG/0.3ML ~~LOC~~ SOLN
30.0000 mg | SUBCUTANEOUS | Status: DC
  Administered 2014-11-01: 30 mg via SUBCUTANEOUS
  Filled 2014-10-31 (×2): qty 0.3

## 2014-10-31 MED ORDER — NITROGLYCERIN 0.4 MG SL SUBL: 0.4000 mg | SUBLINGUAL_TABLET | SUBLINGUAL | Status: DC | PRN

## 2014-10-31 MED ORDER — INSULIN ASPART 100 UNIT/ML ~~LOC~~ SOLN: 0.0000 [IU] | Freq: Three times a day (TID) | SUBCUTANEOUS | Status: DC

## 2014-10-31 MED ORDER — ACETAMINOPHEN 650 MG RE SUPP: 650.0000 mg | Freq: Four times a day (QID) | RECTAL | Status: DC | PRN

## 2014-10-31 MED ORDER — PRAVASTATIN SODIUM 40 MG PO TABS
40.0000 mg | ORAL_TABLET | Freq: Every day | ORAL | Status: DC
  Filled 2014-10-31: qty 1

## 2014-10-31 MED ORDER — ASPIRIN EC 325 MG PO TBEC
325.0000 mg | DELAYED_RELEASE_TABLET | Freq: Every day | ORAL | Status: DC
  Administered 2014-11-01: 325 mg via ORAL
  Filled 2014-10-31: qty 1

## 2014-10-31 MED ORDER — ONDANSETRON HCL 4 MG/2ML IJ SOLN: 4.0000 mg | Freq: Four times a day (QID) | INTRAMUSCULAR | Status: DC | PRN

## 2014-10-31 MED ORDER — SODIUM CHLORIDE 0.9 % IV BOLUS (SEPSIS)
500.0000 mL | Freq: Once | INTRAVENOUS | Status: AC
  Administered 2014-10-31: 500 mL via INTRAVENOUS

## 2014-10-31 MED ORDER — CALCIUM CARBONATE ANTACID 500 MG PO CHEW: 1.0000 | CHEWABLE_TABLET | ORAL | Status: DC | PRN

## 2014-10-31 MED ORDER — POLYSACCHARIDE IRON COMPLEX 150 MG PO CAPS
150.0000 mg | ORAL_CAPSULE | Freq: Two times a day (BID) | ORAL | Status: DC
  Administered 2014-11-01 (×2): 150 mg via ORAL
  Filled 2014-10-31 (×2): qty 1

## 2014-10-31 MED ORDER — PANTOPRAZOLE SODIUM 40 MG PO TBEC
40.0000 mg | DELAYED_RELEASE_TABLET | Freq: Every day | ORAL | Status: DC
  Administered 2014-11-01: 40 mg via ORAL
  Filled 2014-10-31: qty 1

## 2014-10-31 MED ORDER — ASPIRIN 81 MG PO TBEC: 81.0000 mg | DELAYED_RELEASE_TABLET | Freq: Every day | ORAL | Status: DC

## 2014-10-31 MED ORDER — SODIUM CHLORIDE 0.9 % IV SOLN
INTRAVENOUS | Status: DC
  Administered 2014-11-01: via INTRAVENOUS

## 2014-10-31 MED ORDER — TRAMADOL HCL 50 MG PO TABS: 50.0000 mg | ORAL_TABLET | Freq: Four times a day (QID) | ORAL | Status: DC | PRN

## 2014-10-31 MED ORDER — BROMOCRIPTINE MESYLATE 2.5 MG PO TABS
2.5000 mg | ORAL_TABLET | Freq: Every day | ORAL | Status: DC
  Filled 2014-10-31 (×2): qty 1

## 2014-10-31 MED ORDER — ACETAMINOPHEN 325 MG PO TABS: 650.0000 mg | ORAL_TABLET | Freq: Four times a day (QID) | ORAL | Status: DC | PRN

## 2014-10-31 NOTE — ED Notes (Signed)
Per [facility], pt was at home walking down the hall. She began to fall. Her children were behind her and caught her and eased her to the couch.  She was incontinent of urine at this time which was unusual for the pt.  Family felt that it would be best to bring her to the hospital for further evaluation. VS per EMS. HR: 82, BP: 150/74 CBG: 181 O2 100% RA Resp: 18

## 2014-10-31 NOTE — H&P (Signed)
[facility] History and Physical  [name] ZOX:[id number] DOB: Nov 10, 1925 DOA: 10/31/2014  Referring physician: ER physician. PCP: Renato Shin, MD   Chief Complaint: Loss of consciousness.  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 As per patient's family and patient agreed fine and has been doing her routine chores at her house and later in the evening patient was feeling fatigued and when she sat on the chair she briefly lost consciousness for a few seconds but regained spontaneously. Patient did not have any chest pain nausea vomiting diaphoresis abdominal pain diarrhea or any shortness of breath. In the ER patient's CT head was negative for anything acute. Patient was found to be nonfocal. EKG was showing sinus rhythm with nonspecific findings. Patient was not orthostatic. Patient's family state patient was not started on any new medications. Patient has been admitted for further observation for syncope.   Review of Systems: As presented in the history of presenting illness, rest negative.  Past Medical History  Diagnosis Date  . Arrhythmia     paroxysmal atrial fibrillation  . Coronary artery disease   . Hypertension   . Hyperlipidemia   . Edema   . GERD (gastroesophageal reflux disease)   . Anemia B twelve deficiency   . Dizziness - giddy   . Abdominal pain   . Iron deficiency anemia, unspecified   . Pernicious anemia   . Thoracic or lumbosacral neuritis or radiculitis, unspecified   . Symptomatic menopausal or female climacteric states   . Lumbago   . Benign neoplasm of colon   . Allergic rhinitis, cause unspecified   . Type II diabetes mellitus   . Degeneration of lumbar or lumbosacral intervertebral disc   . Arthritis     "arms and  hands" (06/30/2013)   Past Surgical History  Procedure Laterality Date  . Back surgery    . Cholecystectomy    . Appendectomy  1974  . Abdominal hysterectomy  1974  . Carpal tunnel release Bilateral 1970's  . Cataract extraction, bilateral Bilateral   . Cardiac catheterization    . Coronary angioplasty with stent placement      "1" (06/30/2013  . Lumbar disc surgery     Social History:  reports that she has never smoked. Her smokeless tobacco use includes Snuff. She reports that she does not drink alcohol or use illicit drugs. Where does patient live at home. Can patient participate in ADLs? Yes.  Allergies  Allergen Reactions  . Penicillins Swelling    REACTION: swelling of joints  . Sulfonamide Derivatives Other (See Comments)    REACTION: "like my head was full of water"  . Albumin (Human) Other (See Comments)    Doesn't remember   . Clindamycin Other (See Comments)    Doesn't remember   . Iodides Hives  . Iodinated Diagnostic Agents Hives  . Sulfa Antibiotics Hives  . Celecoxib Itching and Rash  . Erythromycin Itching and Rash  . Nitrofuran Derivatives Rash  . Nitrofurantoin Itching and Rash  . Piroxicam Other (See Comments)    unknown  . Povidone-Iodine Itching and Rash    Family History: History reviewed. No pertinent family history.    Prior to Admission medications   Medication Sig Start Date End Date Taking? Authorizing Provider  aspirin 81 MG EC tablet Take 81  mg by mouth daily.     Yes Historical Provider, MD  bromocriptine (PARLODEL) 2.5 MG tablet Take 1 tablet (2.5 mg total) by mouth at bedtime. 08/16/13  Yes Renato Shin, MD  calcium carbonate (TUMS - DOSED IN MG ELEMENTAL CALCIUM) 500 MG chewable tablet Chew 1 tablet by mouth as needed for heartburn.   Yes Historical Provider, MD  iron polysaccharides (FERREX 150) 150 MG capsule Take 150 mg by mouth 2 (two) times daily.   Yes Historical Provider, MD  lisinopril-hydrochlorothiazide (PRINZIDE,ZESTORETIC)  20-12.5 MG per tablet Take 2 tablets by mouth daily. 01/12/14  Yes Burnell Blanks, MD  loratadine-pseudoephedrine (CLARITIN-D 24-HOUR) 10-240 MG per 24 hr tablet Take 1 tablet by mouth. 1 daily as needed for congestion    Yes Historical Provider, MD  metFORMIN (GLUCOPHAGE-XR) 500 MG 24 hr tablet TAKE ONE TABLET BY MOUTH TWICE DAILY 10/20/14  Yes Renato Shin, MD  metoprolol succinate (TOPROL-XL) 50 MG 24 hr tablet TAKE 1 TABLET (50 MG TOTAL) BY MOUTH DAILY. TAKE WITH OR IMMEDIATELY FOLLOWING A MEAL. 09/20/14  Yes Burnell Blanks, MD  omeprazole (PRILOSEC) 40 MG capsule TAKE 1 CAPSULE (40 MG TOTAL) BY MOUTH DAILY. 08/01/14  Yes Renato Shin, MD  pravastatin (PRAVACHOL) 40 MG tablet TAKE 1 TABLET (40 MG TOTAL) BY MOUTH AT BEDTIME. 09/07/14  Yes Burnell Blanks, MD  nitroGLYCERIN (NITROSTAT) 0.4 MG SL tablet Place 0.4 mg under the tongue every 5 (five) minutes as needed.      Historical Provider, MD  traMADol (ULTRAM) 50 MG tablet Take 1 tablet (50 mg total) by mouth every 6 (six) hours as needed. 03/07/14   Renato Shin, MD    Physical Exam: Filed Vitals:   10/31/14 1743 10/31/14 2018  BP: 167/73 152/67  Pulse: 92 67  Temp: 97.9 F (36.6 C)   TempSrc: Oral   Resp: 16 15  SpO2: 98% 100%     General:  Well-developed and nourished.  Eyes: Anicteric no pallor.  ENT: No discharge from the ears eyes nose or mouth.  Neck: No mass felt.  Cardiovascular: S1-S2 heard.  Respiratory: No rhonchi or crepitations.  Abdomen: Soft nontender bowel sounds present.  Skin: No rash.  Musculoskeletal: No edema.  Psychiatric: Appears normal.  Neurologic: Alert awake oriented to time place and person. Moves all extremities 5 x 5. No facial asymmetry. Tongue is midline. PERRLA positive.  Labs on Admission:  Basic Metabolic Panel:  Recent Labs Lab 10/31/14 1811  NA 143  K 3.9  CL 104  CO2 23  GLUCOSE 125*  BUN 36*  CREATININE 1.10  CALCIUM 9.8   Liver Function  Tests:  Recent Labs Lab 10/31/14 1811  AST 19  ALT 11  ALKPHOS 78  BILITOT 0.4  PROT 7.1  ALBUMIN 3.9   No results for input(s): LIPASE, AMYLASE in the last 168 hours. No results for input(s): AMMONIA in the last 168 hours. CBC:  Recent Labs Lab 10/31/14 1811  WBC 5.3  NEUTROABS 3.1  HGB 9.8*  HCT 29.7*  MCV 89.2  PLT 211   Cardiac Enzymes:  Recent Labs Lab 10/31/14 1811  TROPONINI <0.30    BNP (last 3 results) No results for input(s): PROBNP in the last 8760 hours. CBG:  Recent Labs Lab 10/31/14 1835  GLUCAP 120*    Radiological Exams on Admission: Dg Chest 2 View  10/31/2014   CLINICAL DATA:  Syncopal episode today. Fall from standing position. Initial encounter.  EXAM: CHEST  2 VIEW  COMPARISON:  Abdominal CT 09/26/2014.  Chest radiographs 12/10/2013.  FINDINGS: The heart size and mediastinal contours are stable. There is stable elevation of the right hemidiaphragm associated with mild right basilar atelectasis. The left lung is clear. There is no pleural effusion or pneumothorax. No acute osseous findings are demonstrated.  IMPRESSION: Stable examination.  No active cardiopulmonary process.   Electronically Signed   By: Camie Patience M.D.   On: 10/31/2014 20:13   Ct Head Wo Contrast  10/31/2014   CLINICAL DATA:  Syncopal episode today.  Initial encounter.  EXAM: CT HEAD WITHOUT CONTRAST  TECHNIQUE: Contiguous axial images were obtained from the base of the skull through the vertex without intravenous contrast.  COMPARISON:  12/01/2013 Head CT.  FINDINGS: There is no evidence of acute intracranial hemorrhage, mass lesion, brain edema or extra-axial fluid collection. The ventricles and subarachnoid spaces are mildly prominent but stable. There is no CT evidence of acute cortical infarction. There is stable mild periventricular white matter disease and cavum septum pellucidum and vergae. Intracranial vascular calcifications noted.  There is mild ethmoid sinus  mucosal thickening without air-fluid levels. The additional visualized paranasal sinuses, mastoid air cells and middle ears are clear. The calvarium is intact.  IMPRESSION: 1. No acute intracranial findings. 2. Progressive ethmoid sinus mucosal thickening.   Electronically Signed   By: Camie Patience M.D.   On: 10/31/2014 19:48    EKG: Independently reviewed. Normal sinus rhythm with T-wave inversion in the V1 and V2.  Assessment/Plan Principal Problem:   Syncope Active Problems:   CAD, NATIVE VESSEL   PAROXYSMAL ATRIAL FIBRILLATION   Diabetes mellitus type 2, controlled   Hypertension   1. Syncope - given the cardiac history we will closely monitor in telemetry for any arrhythmias. Recheck orthostatics in a.m. A physical therapy consult. Since patient has history of CAD we will cycle cardiac markers. 2. CAD - denies any chest pain. Continue antiplatelet agents. See #1. 3. Paroxysmal atrial fibrillation presently in sinus rhythm - per cardiology patient was not felt to be a candidate for anticoagulants and is on antiplatelet agents due to risk of falls. 4. Diabetes mellitus type 2 controlled - closely follow CBGs with sliding scale coverage. 5. Hypertension - continue home medications. 6. Hyperlipidemia - on statins.    Code Status: Full code.  Family Communication: Patient's family at the bedside.  Disposition Plan: Admit for observation.    Theodoros Stjames N. [facility] Pager [phone].  If 7PM-7AM, please contact night-coverage [email or web address] Password [facility] 10/31/2014, 9:52 PM

## 2014-10-31 NOTE — ED Notes (Signed)
Pt CBG 120

## 2014-10-31 NOTE — ED Notes (Signed)
Attempted to call report to floor, nurse was unavailable at this time and will call back.

## 2014-10-31 NOTE — ED Notes (Signed)
Patient transported to CT 

## 2014-10-31 NOTE — ED Provider Notes (Signed)
CSN: [id number]     Arrival date & time 10/31/14  1741 History   First MD Initiated Contact with Patient 10/31/14 1805     Chief Complaint  Patient presents with  . Near Syncope     (Consider location/radiation/quality/duration/timing/severity/associated sxs/prior Treatment) The history is provided by the patient. No language interpreter was used.  [name] is an 80 year old female with past medical history of CAD with stent placement 1, hypertension, hyperlipidemia, eczema, GERD, abdominal pain, iron deficiency anemia, arthritis, type 2 diabetes presenting to the ED with syncopal episode that occurred at approximate 4:50 PM this evening. Family who accompanies patient, reported that patient is not feeling well today-patient reported she exerted herself too much she believes. Family reported that patient voiced that she felt tired, stated that patient was sitting in the chair and she passed out for a few minutes. Syncopal episode was witnessed. Denied convulsions or head injury. Stated that shortly patient came to was alert and oriented total was going on around her. Patient denied chest pain, shortness of breath, difficulty breathing, dizziness, blurred vision, sudden loss of vision, leg pain, leg swelling, travels, weakness, loss sensation, numbness, tingling, head injuries, seizure-like activity, fever, chills. Family denied changes to behavior, personality, mentation. PCP Dr. Loanne Drilling  Past Medical History  Diagnosis Date  . Arrhythmia     paroxysmal atrial fibrillation  . Coronary artery disease   . Hypertension   . Hyperlipidemia   . Edema   . GERD (gastroesophageal reflux disease)   . Anemia B twelve deficiency   . Dizziness - giddy   . Abdominal pain   . Iron deficiency anemia, unspecified   . Pernicious anemia   . Thoracic or lumbosacral neuritis or radiculitis, unspecified   . Symptomatic menopausal or female climacteric states   . Lumbago   . Benign neoplasm of  colon   . Allergic rhinitis, cause unspecified   . Type II diabetes mellitus   . Degeneration of lumbar or lumbosacral intervertebral disc   . Arthritis     "arms and hands" (06/30/2013)   Past Surgical History  Procedure Laterality Date  . Back surgery    . Cholecystectomy    . Appendectomy  1974  . Abdominal hysterectomy  1974  . Carpal tunnel release Bilateral 1970's  . Cataract extraction, bilateral Bilateral   . Cardiac catheterization    . Coronary angioplasty with stent placement      "1" (06/30/2013  . Lumbar disc surgery     No family history on file. History  Substance Use Topics  . Smoking status: Never Smoker   . Smokeless tobacco: Current User    Types: Snuff  . Alcohol Use: No   OB History    No data available     Review of Systems  Constitutional: Negative for fever and chills.  Eyes: Negative for visual disturbance.  Respiratory: Negative for cough, chest tightness and shortness of breath.   Cardiovascular: Negative for chest pain.  Gastrointestinal: Negative for nausea, vomiting, abdominal pain and diarrhea.  Genitourinary: Negative for dysuria.  Musculoskeletal: Negative for back pain and neck pain.  Neurological: Positive for syncope and headaches. Negative for dizziness, weakness and numbness.      Allergies  Penicillins; Sulfonamide derivatives; Albumin (human); Clindamycin; Iodides; Iodinated diagnostic agents; Sulfa antibiotics; Celecoxib; Erythromycin; Nitrofuran derivatives; Nitrofurantoin; Piroxicam; and Povidone-iodine  Home Medications   Prior to Admission medications   Medication Sig Start Date End Date Taking? Authorizing Provider  aspirin 81 MG EC tablet Take 81  mg by mouth daily.     Yes Historical Provider, MD  bromocriptine (PARLODEL) 2.5 MG tablet Take 1 tablet (2.5 mg total) by mouth at bedtime. 08/16/13  Yes Renato Shin, MD  calcium carbonate (TUMS - DOSED IN MG ELEMENTAL CALCIUM) 500 MG chewable tablet Chew 1 tablet by mouth as  needed for heartburn.   Yes Historical Provider, MD  iron polysaccharides (FERREX 150) 150 MG capsule Take 150 mg by mouth 2 (two) times daily.   Yes Historical Provider, MD  lisinopril-hydrochlorothiazide (PRINZIDE,ZESTORETIC) 20-12.5 MG per tablet Take 2 tablets by mouth daily. 01/12/14  Yes Burnell Blanks, MD  loratadine-pseudoephedrine (CLARITIN-D 24-HOUR) 10-240 MG per 24 hr tablet Take 1 tablet by mouth. 1 daily as needed for congestion    Yes Historical Provider, MD  metFORMIN (GLUCOPHAGE-XR) 500 MG 24 hr tablet TAKE ONE TABLET BY MOUTH TWICE DAILY 10/20/14  Yes Renato Shin, MD  metoprolol succinate (TOPROL-XL) 50 MG 24 hr tablet TAKE 1 TABLET (50 MG TOTAL) BY MOUTH DAILY. TAKE WITH OR IMMEDIATELY FOLLOWING A MEAL. 09/20/14  Yes Burnell Blanks, MD  omeprazole (PRILOSEC) 40 MG capsule TAKE 1 CAPSULE (40 MG TOTAL) BY MOUTH DAILY. 08/01/14  Yes Renato Shin, MD  pravastatin (PRAVACHOL) 40 MG tablet TAKE 1 TABLET (40 MG TOTAL) BY MOUTH AT BEDTIME. 09/07/14  Yes Burnell Blanks, MD  nitroGLYCERIN (NITROSTAT) 0.4 MG SL tablet Place 0.4 mg under the tongue every 5 (five) minutes as needed.      Historical Provider, MD  traMADol (ULTRAM) 50 MG tablet Take 1 tablet (50 mg total) by mouth every 6 (six) hours as needed. 03/07/14   Renato Shin, MD   BP 152/67 mmHg  Pulse 67  Temp(Src) 97.9 F (36.6 C) (Oral)  Resp 15  SpO2 100% Physical Exam  Constitutional: She is oriented to person, place, and time. She appears well-developed and well-nourished. No distress.  HENT:  Head: Normocephalic and atraumatic.  Mouth/Throat: Oropharynx is clear and moist. No oropharyngeal exudate.  Eyes: Conjunctivae and EOM are normal. Pupils are equal, round, and reactive to light. Right eye exhibits no discharge. Left eye exhibits no discharge.  Neck: Normal range of motion. Neck supple. No tracheal deviation present.  Cardiovascular: Normal rate, regular rhythm and normal heart sounds.    Pulses:      Radial pulses are 2+ on the right side, and 2+ on the left side.       Dorsalis pedis pulses are 2+ on the right side, and 2+ on the left side.  Cap refill < 3 seconds Negative swelling or pitting edema noted to the lower extremities bilaterally   Pulmonary/Chest: Effort normal and breath sounds normal. No respiratory distress. She has no wheezes. She has no rales. She exhibits no tenderness.  Musculoskeletal: Normal range of motion.  Full ROM to upper and lower extremities without difficulty noted, negative ataxia noted.  Lymphadenopathy:    She has no cervical adenopathy.  Neurological: She is alert and oriented to person, place, and time. No cranial nerve deficit. She exhibits normal muscle tone. Coordination normal. GCS eye subscore is 4. GCS verbal subscore is 5. GCS motor subscore is 6.  Cranial nerves III-XII grossly intact Strength 5+/5+ to upper and lower extremities bilaterally with resistance applied, equal distribution noted Equal grip strength bilaterally  Negative facial drooping Negative slurred speech  Negative aphasia Negative arm drift Fine motor skills intact Patient response to questions Patient follows commands  Skin: Skin is warm and dry. She is not  diaphoretic. No erythema.  Psychiatric: She has a normal mood and affect. Her behavior is normal. Thought content normal.  Nursing note and vitals reviewed.   ED Course  Procedures (including critical care time)  Results for orders placed or performed during the hospital encounter of 10/31/14  CBC with Differential  Result Value Ref Range   WBC 5.3 4.0 - 10.5 K/uL   RBC 3.33 (L) 3.87 - 5.11 MIL/uL   Hemoglobin 9.8 (L) 12.0 - 15.0 g/dL   HCT 29.7 (L) 36.0 - 46.0 %   MCV 89.2 78.0 - 100.0 fL   MCH 29.4 26.0 - 34.0 pg   MCHC 33.0 30.0 - 36.0 g/dL   RDW 13.5 11.5 - 15.5 %   Platelets 211 150 - 400 K/uL   Neutrophils Relative % 59 43 - 77 %   Neutro Abs 3.1 1.7 - 7.7 K/uL   Lymphocytes Relative 31  12 - 46 %   Lymphs Abs 1.6 0.7 - 4.0 K/uL   Monocytes Relative 7 3 - 12 %   Monocytes Absolute 0.4 0.1 - 1.0 K/uL   Eosinophils Relative 3 0 - 5 %   Eosinophils Absolute 0.2 0.0 - 0.7 K/uL   Basophils Relative 0 0 - 1 %   Basophils Absolute 0.0 0.0 - 0.1 K/uL  Comprehensive metabolic panel  Result Value Ref Range   Sodium 143 137 - 147 mEq/L   Potassium 3.9 3.7 - 5.3 mEq/L   Chloride 104 96 - 112 mEq/L   CO2 23 19 - 32 mEq/L   Glucose, Bld 125 (H) 70 - 99 mg/dL   BUN 36 (H) 6 - 23 mg/dL   Creatinine, Ser 1.10 0.50 - 1.10 mg/dL   Calcium 9.8 8.4 - 10.5 mg/dL   Total Protein 7.1 6.0 - 8.3 g/dL   Albumin 3.9 3.5 - 5.2 g/dL   AST 19 0 - 37 U/L   ALT 11 0 - 35 U/L   Alkaline Phosphatase 78 39 - 117 U/L   Total Bilirubin 0.4 0.3 - 1.2 mg/dL   GFR calc non Af Amer 44 (L) >90 mL/min   GFR calc Af Amer 51 (L) >90 mL/min   Anion gap 16 (H) 5 - 15  Troponin I  Result Value Ref Range   Troponin I <0.30 <0.30 ng/mL  Urinalysis, Routine w reflex microscopic  Result Value Ref Range   Color, Urine YELLOW YELLOW   APPearance CLEAR CLEAR   Specific Gravity, Urine 1.019 1.005 - 1.030   pH 5.0 5.0 - 8.0   Glucose, UA NEGATIVE NEGATIVE mg/dL   Hgb urine dipstick NEGATIVE NEGATIVE   Bilirubin Urine NEGATIVE NEGATIVE   Ketones, ur NEGATIVE NEGATIVE mg/dL   Protein, ur NEGATIVE NEGATIVE mg/dL   Urobilinogen, UA 1.0 0.0 - 1.0 mg/dL   Nitrite NEGATIVE NEGATIVE   Leukocytes, UA LARGE (A) NEGATIVE  Urine microscopic-add on  Result Value Ref Range   Squamous Epithelial / LPF FEW (A) RARE   WBC, UA 11-20 <3 WBC/hpf   RBC / HPF 0-2 <3 RBC/hpf   Bacteria, UA RARE RARE    Labs Review Labs Reviewed  CBC WITH DIFFERENTIAL - Abnormal; Notable for the following:    RBC 3.33 (*)    Hemoglobin 9.8 (*)    HCT 29.7 (*)    All other components within normal limits  COMPREHENSIVE METABOLIC PANEL - Abnormal; Notable for the following:    Glucose, Bld 125 (*)    BUN 36 (*)    GFR  calc non Af Amer 44  (*)    GFR calc Af Amer 51 (*)    Anion gap 16 (*)    All other components within normal limits  URINALYSIS, ROUTINE W REFLEX MICROSCOPIC - Abnormal; Notable for the following:    Leukocytes, UA LARGE (*)    All other components within normal limits  URINE MICROSCOPIC-ADD ON - Abnormal; Notable for the following:    Squamous Epithelial / LPF FEW (*)    All other components within normal limits  URINE CULTURE  TROPONIN I  POCT CBG (FASTING - GLUCOSE)-MANUAL ENTRY    Imaging Review Dg Chest 2 View  10/31/2014   CLINICAL DATA:  Syncopal episode today. Fall from standing position. Initial encounter.  EXAM: CHEST  2 VIEW  COMPARISON:  Abdominal CT 09/26/2014.  Chest radiographs 12/10/2013.  FINDINGS: The heart size and mediastinal contours are stable. There is stable elevation of the right hemidiaphragm associated with mild right basilar atelectasis. The left lung is clear. There is no pleural effusion or pneumothorax. No acute osseous findings are demonstrated.  IMPRESSION: Stable examination.  No active cardiopulmonary process.   Electronically Signed   By: Camie Patience M.D.   On: 10/31/2014 20:13   Ct Head Wo Contrast  10/31/2014   CLINICAL DATA:  Syncopal episode today.  Initial encounter.  EXAM: CT HEAD WITHOUT CONTRAST  TECHNIQUE: Contiguous axial images were obtained from the base of the skull through the vertex without intravenous contrast.  COMPARISON:  12/01/2013 Head CT.  FINDINGS: There is no evidence of acute intracranial hemorrhage, mass lesion, brain edema or extra-axial fluid collection. The ventricles and subarachnoid spaces are mildly prominent but stable. There is no CT evidence of acute cortical infarction. There is stable mild periventricular white matter disease and cavum septum pellucidum and vergae. Intracranial vascular calcifications noted.  There is mild ethmoid sinus mucosal thickening without air-fluid levels. The additional visualized paranasal sinuses, mastoid air  cells and middle ears are clear. The calvarium is intact.  IMPRESSION: 1. No acute intracranial findings. 2. Progressive ethmoid sinus mucosal thickening.   Electronically Signed   By: Camie Patience M.D.   On: 10/31/2014 19:48     EKG Interpretation   Date/Time:  Monday October 31 2014 17:42:18 EST Ventricular Rate:  90 PR Interval:  209 QRS Duration: 86 QT Interval:  370 QTC Calculation: 453 R Axis:   17 Text Interpretation:  Sinus rhythm Atrial premature complexes Anteroseptal  infarct, age indeterminate Minimal ST elevation, inferior leads no  significant change since Jan 2015 Confirmed by Regenia Skeeter  MD, SCOTT [phone])  on 10/31/2014 8:36:34 PM       Orthostatic VS for the past 24 hrs:  BP- Lying Pulse- Lying BP- Sitting Pulse- Sitting BP- Standing at 0 minutes Pulse- Standing at 0 minutes  10/31/14 2018 152/67 mmHg 100 145/72 mmHg 87 157/69 mmHg 100    9:25 PM This provider spoke with Dr. Hal Hope, [facility] hospitalist-discussed case, labs, imaging, vitals, ED course in great detail. Patient to be admitted to telemetry regarding syncopal episode.  MDM   Final diagnoses:  Syncope, unspecified syncope type    Medications  sodium chloride 0.9 % bolus 500 mL (0 mLs Intravenous Stopped 10/31/14 2016)    Filed Vitals:   10/31/14 1743 10/31/14 2018  BP: 167/73 152/67  Pulse: 92 67  Temp: 97.9 F (36.6 C)   TempSrc: Oral   Resp: 16 15  SpO2: 98% 100%   EKG noted sinus rhythm with a heart rate  of 90 bpm, 8 show premature complexes identified - no change since last tracing. Troponin negative elevation. CBC negative elevated leukocytosis. Hemoglobin 9.8, hematocrit 29.7-patient appears to be chronically anemic. CMP noted BUN mildly elevated at 36. Glucose 125 with mildly elevated anion gap of 16.0 mEq per liter. Urinalysis negative hemoglobin, negative nitrites, large leukocytes with white blood cell count 11-20. Urine culture pending. CT head no acute intracranial processes.  Chest x-ray stable examination-no active cardio pulmonary disease. Orthostatics unremarkable. Negative findings of pneumonia. CT negative for acute abnormalities. Patient presenting to the ED with a syncopal episode that occurred this afternoon. Patient has significant past medical history as well as age being risk factors. Patient to be admitted to hospital for observation regarding syncopal episode. Patient given IV fluids in ED setting. Discussed case in great detail with [facility]-patient to be admitted to telemetry for observation regarding syncopal episode. Discussed plan for admission with patient and family who agree to plan of care. Patient stable for transfer.  Jamse Mead, PA-C 10/31/14 2131  Ephraim Hamburger, MD 11/01/14 [phone]

## 2014-11-01 DIAGNOSIS — I480 Paroxysmal atrial fibrillation: Secondary | ICD-10-CM

## 2014-11-01 DIAGNOSIS — R55 Syncope and collapse: Secondary | ICD-10-CM | POA: Diagnosis not present

## 2014-11-01 DIAGNOSIS — E119 Type 2 diabetes mellitus without complications: Secondary | ICD-10-CM | POA: Diagnosis not present

## 2014-11-01 DIAGNOSIS — I10 Essential (primary) hypertension: Secondary | ICD-10-CM | POA: Diagnosis not present

## 2014-11-01 LAB — TROPONIN I
Troponin I: <0.03 ng/mL (ref <0.031)
Troponin I: <0.03 ng/mL (ref <0.031)
Troponin I: 0.03 ng/mL (ref <0.031)

## 2014-11-01 LAB — CBC WITH DIFFERENTIAL/PLATELET
Basophils Absolute: 0 10*3/uL (ref 0.0–0.1)
Basophils Relative: 0 % (ref 0–1)
Eosinophils Absolute: 0.2 10*3/uL (ref 0.0–0.7)
Eosinophils Relative: 4 % (ref 0–5)
HCT: 27.8 % — ABNORMAL LOW (ref 36.0–46.0)
Hemoglobin: 9 g/dL — ABNORMAL LOW (ref 12.0–15.0)
Lymphocytes Relative: 34 % (ref 12–46)
Lymphs Abs: 1.4 10*3/uL (ref 0.7–4.0)
MCH: 28.4 pg (ref 26.0–34.0)
MCHC: 32.4 g/dL (ref 30.0–36.0)
MCV: 87.7 fL (ref 78.0–100.0)
Monocytes Absolute: 0.5 10*3/uL (ref 0.1–1.0)
Monocytes Relative: 14 % — ABNORMAL HIGH (ref 3–12)
Neutro Abs: 1.9 10*3/uL (ref 1.7–7.7)
Neutrophils Relative %: 48 % (ref 43–77)
Platelets: 194 10*3/uL (ref 150–400)
RBC: 3.17 MIL/uL — ABNORMAL LOW (ref 3.87–5.11)
RDW: 13.5 % (ref 11.5–15.5)
WBC: 4 10*3/uL (ref 4.0–10.5)

## 2014-11-01 LAB — COMPREHENSIVE METABOLIC PANEL
ALT: 12 U/L (ref 0–35)
AST: 18 U/L (ref 0–37)
Albumin: 3.4 g/dL — ABNORMAL LOW (ref 3.5–5.2)
Alkaline Phosphatase: 60 U/L (ref 39–117)
Anion gap: 6 (ref 5–15)
BUN: 26 mg/dL — ABNORMAL HIGH (ref 6–23)
CO2: 24 mmol/L (ref 19–32)
Calcium: 9.1 mg/dL (ref 8.4–10.5)
Chloride: 110 mEq/L (ref 96–112)
Creatinine, Ser: 0.97 mg/dL (ref 0.50–1.10)
GFR calc Af Amer: 59 mL/min — ABNORMAL LOW (ref >90)
GFR calc non Af Amer: 51 mL/min — ABNORMAL LOW (ref >90)
Glucose, Bld: 114 mg/dL — ABNORMAL HIGH (ref 70–99)
Potassium: 3.5 mmol/L (ref 3.5–5.1)
Sodium: 140 mmol/L (ref 135–145)
Total Bilirubin: 0.5 mg/dL (ref 0.3–1.2)
Total Protein: 5.8 g/dL — ABNORMAL LOW (ref 6.0–8.3)

## 2014-11-01 LAB — GLUCOSE, CAPILLARY: Glucose-Capillary: 103 mg/dL — ABNORMAL HIGH (ref 70–99)

## 2014-11-01 LAB — TSH: TSH: 2.504 u[IU]/mL (ref 0.350–4.500)

## 2014-11-01 MED ORDER — CIPROFLOXACIN HCL 500 MG PO TABS: 500.0000 mg | ORAL_TABLET | Freq: Two times a day (BID) | ORAL | Status: DC

## 2014-11-01 MED ORDER — POTASSIUM CHLORIDE CRYS ER 20 MEQ PO TBCR
40.0000 meq | EXTENDED_RELEASE_TABLET | Freq: Once | ORAL | Status: AC
  Administered 2014-11-01: 40 meq via ORAL
  Filled 2014-11-01: qty 2
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
## 2014-11-01 NOTE — Discharge Summary (Signed)
Physician Discharge Summary  [name] YWV:[id number] DOB: 1925-07-02 DOA: 10/31/2014  PCP: Renato Shin, MD  Admit date: 10/31/2014 Discharge date: 11/01/2014  Time spent: 35 minutes  Recommendations for Outpatient Follow-up:  Cbc, bmp 1 week Supervision by family Please follow urine culture- gave family script for cipro  -holding lisinopril/HCTZ for now until seen by you  Discharge Diagnoses:  Principal Problem:   Syncope Active Problems:   CAD, NATIVE VESSEL   PAROXYSMAL ATRIAL FIBRILLATION   Diabetes mellitus type 2, controlled   Hypertension   Discharge Condition: improved- anxious to go home  Diet recommendation: as tolerated  Filed Weights   10/31/14 2337  Weight: 53.207 kg (117 lb 4.8 oz)    History of present illness:  [name] is a 80 y.o. female with history of diabetes mellitus type 2, CAD status post stenting, paroxysmal atrial fibrillation, hypertension was brought to the ER after patient's family witnessed the patient had a brief episode of loss of consciousness. As per patient's family and patient agreed fine and has been doing her routine chores at her house and later in the evening patient was feeling fatigued and when she sat on the chair she briefly lost consciousness for a few seconds but regained spontaneously. Patient did not have any chest pain nausea vomiting diaphoresis abdominal pain diarrhea or any shortness of breath. In the ER patient's CT head was negative for anything acute. Patient was found to be nonfocal. EKG was showing sinus rhythm with nonspecific findings. Patient was not orthostatic. Patient's family state patient was not started on any new medications. Patient has been admitted for further observation for syncope.   Hospital Course:  Abnormal urinalysis- PCP please follow culture- gave family script for cipro but not to fill until culture back  Syncope - CE negative, orthostatics negative after IVF CAD - denies any chest  pain. Continue antiplatelet agents. See #1. Paroxysmal atrial fibrillation presently in sinus rhythm - per cardiology patient was not felt to be a candidate for anticoagulants and is on antiplatelet agents due to risk of falls. Diabetes mellitus type 2 controlled  Hypertension - continue home medications minus lisinopril/HCTZ for now- appeared dry Hyperlipidemia - on statins. Anemia- defer to PCP for further work up  Procedures:    Consultations:    Discharge Exam: Filed Vitals:   11/01/14 1022  BP: 143/74  Pulse:   Temp:   Resp:     General: A+Ox3, NAD- anxious to go home Cardiovascular: rrr Respiratory: clear  Discharge Instructions   Discharge Instructions    Diet - low sodium heart healthy    Complete by:  As directed      Diet Carb Modified    Complete by:  As directed      Discharge instructions    Complete by:  As directed   Hold lisinopril/HCTZ for now until seen by PCP Cbc, bmp 1 week     Increase activity slowly    Complete by:  As directed           Current Discharge Medication List    START taking these medications   Details  ciprofloxacin (CIPRO) 500 MG tablet Take 1 tablet (500 mg total) by mouth 2 (two) times daily. Qty: 6 tablet, Refills: 0      CONTINUE these medications which have NOT CHANGED   Details  aspirin 81 MG EC tablet Take 81 mg by mouth daily.      bromocriptine (PARLODEL) 2.5 MG tablet Take 1 tablet (2.5 mg total)  by mouth at bedtime. Qty: 30 tablet, Refills: 11    calcium carbonate (TUMS - DOSED IN MG ELEMENTAL CALCIUM) 500 MG chewable tablet Chew 1 tablet by mouth as needed for heartburn.    iron polysaccharides (FERREX 150) 150 MG capsule Take 150 mg by mouth 2 (two) times daily.    loratadine-pseudoephedrine (CLARITIN-D 24-HOUR) 10-240 MG per 24 hr tablet Take 1 tablet by mouth. 1 daily as needed for congestion     metFORMIN (GLUCOPHAGE-XR) 500 MG 24 hr tablet TAKE ONE TABLET BY MOUTH TWICE DAILY Qty: 60 tablet,  Refills: 0    metoprolol succinate (TOPROL-XL) 50 MG 24 hr tablet TAKE 1 TABLET (50 MG TOTAL) BY MOUTH DAILY. TAKE WITH OR IMMEDIATELY FOLLOWING A MEAL. Qty: 30 tablet, Refills: 4    omeprazole (PRILOSEC) 40 MG capsule TAKE 1 CAPSULE (40 MG TOTAL) BY MOUTH DAILY. Qty: 30 capsule, Refills: 11    pravastatin (PRAVACHOL) 40 MG tablet TAKE 1 TABLET (40 MG TOTAL) BY MOUTH AT BEDTIME. Qty: 30 tablet, Refills: 6    nitroGLYCERIN (NITROSTAT) 0.4 MG SL tablet Place 0.4 mg under the tongue every 5 (five) minutes as needed.      traMADol (ULTRAM) 50 MG tablet Take 1 tablet (50 mg total) by mouth every 6 (six) hours as needed. Qty: 30 tablet, Refills: 0      STOP taking these medications     lisinopril-hydrochlorothiazide (PRINZIDE,ZESTORETIC) 20-12.5 MG per tablet        Allergies  Allergen Reactions  . Penicillins Swelling    REACTION: swelling of joints  . Sulfonamide Derivatives Other (See Comments)    REACTION: "like my head was full of water"  . Albumin (Human) Other (See Comments)    Doesn't remember   . Clindamycin Other (See Comments)    Doesn't remember   . Iodides Hives  . Iodinated Diagnostic Agents Hives  . Sulfa Antibiotics Hives  . Celecoxib Itching and Rash  . Erythromycin Itching and Rash  . Nitrofuran Derivatives Rash  . Nitrofurantoin Itching and Rash  . Piroxicam Other (See Comments)    unknown  . Povidone-Iodine Itching and Rash   Follow-up Information    Follow up with Renato Shin, MD In 1 week.   Specialty:  Endocrinology   Contact information:   [address] [place] [address] [phone]        The results of significant diagnostics from this hospitalization (including imaging, microbiology, ancillary and laboratory) are listed below for reference.    Significant Diagnostic Studies: Dg Chest 2 View  10/31/2014   CLINICAL DATA:  Syncopal episode today. Fall from standing position. Initial encounter.  EXAM: CHEST  2 VIEW   COMPARISON:  Abdominal CT 09/26/2014.  Chest radiographs 12/10/2013.  FINDINGS: The heart size and mediastinal contours are stable. There is stable elevation of the right hemidiaphragm associated with mild right basilar atelectasis. The left lung is clear. There is no pleural effusion or pneumothorax. No acute osseous findings are demonstrated.  IMPRESSION: Stable examination.  No active cardiopulmonary process.   Electronically Signed   By: Camie Patience M.D.   On: 10/31/2014 20:13   Ct Head Wo Contrast  10/31/2014   CLINICAL DATA:  Syncopal episode today.  Initial encounter.  EXAM: CT HEAD WITHOUT CONTRAST  TECHNIQUE: Contiguous axial images were obtained from the base of the skull through the vertex without intravenous contrast.  COMPARISON:  12/01/2013 Head CT.  FINDINGS: There is no evidence of acute intracranial hemorrhage, mass lesion,  brain edema or extra-axial fluid collection. The ventricles and subarachnoid spaces are mildly prominent but stable. There is no CT evidence of acute cortical infarction. There is stable mild periventricular white matter disease and cavum septum pellucidum and vergae. Intracranial vascular calcifications noted.  There is mild ethmoid sinus mucosal thickening without air-fluid levels. The additional visualized paranasal sinuses, mastoid air cells and middle ears are clear. The calvarium is intact.  IMPRESSION: 1. No acute intracranial findings. 2. Progressive ethmoid sinus mucosal thickening.   Electronically Signed   By: Camie Patience M.D.   On: 10/31/2014 19:48    Microbiology: No results found for this or any previous visit (from the past 240 hour(s)).   Labs: Basic Metabolic Panel:  Recent Labs Lab 10/31/14 1811 11/01/14 0518  NA 143 140  K 3.9 3.5  CL 104 110  CO2 23 24  GLUCOSE 125* 114*  BUN 36* 26*  CREATININE 1.10 0.97  CALCIUM 9.8 9.1   Liver Function Tests:  Recent Labs Lab 10/31/14 1811 11/01/14 0518  AST 19 18  ALT 11 12  ALKPHOS 78  60  BILITOT 0.4 0.5  PROT 7.1 5.8*  ALBUMIN 3.9 3.4*   No results for input(s): LIPASE, AMYLASE in the last 168 hours. No results for input(s): AMMONIA in the last 168 hours. CBC:  Recent Labs Lab 10/31/14 1811 11/01/14 0518  WBC 5.3 4.0  NEUTROABS 3.1 1.9  HGB 9.8* 9.0*  HCT 29.7* 27.8*  MCV 89.2 87.7  PLT 211 194   Cardiac Enzymes:  Recent Labs Lab 10/31/14 0145 10/31/14 1811 11/01/14 0518  TROPONINI <0.03 <0.30 <0.03   BNP: BNP (last 3 results) No results for input(s): PROBNP in the last 8760 hours. CBG:  Recent Labs Lab 10/31/14 1835 10/31/14 2348 11/01/14 0743  GLUCAP 120* 164* 103*       Signed:  Eliseo Squires, Edgerrin Correia  [facility] 11/01/2014, 11:43 AM

## 2014-11-01 NOTE — Progress Notes (Signed)
UR completed 

## 2014-11-02 LAB — URINE CULTURE
Colony Count: NO GROWTH
Culture: NO GROWTH
Special Requests: NORMAL

## 2014-11-14 ENCOUNTER — Encounter: Payer: Self-pay | Admitting: Endocrinology

## 2014-11-14 ENCOUNTER — Other Ambulatory Visit: Payer: Self-pay | Admitting: Endocrinology

## 2014-11-14 ENCOUNTER — Ambulatory Visit (INDEPENDENT_AMBULATORY_CARE_PROVIDER_SITE_OTHER): Payer: Medicare Other | Admitting: Endocrinology

## 2014-11-14 VITALS — BP 132/70 | HR 95 | Temp 97.6°F | Ht 61.0 in | Wt 116.0 lb

## 2014-11-14 DIAGNOSIS — E871 Hypo-osmolality and hyponatremia: Secondary | ICD-10-CM

## 2014-11-14 DIAGNOSIS — E119 Type 2 diabetes mellitus without complications: Secondary | ICD-10-CM

## 2014-11-14 DIAGNOSIS — D509 Iron deficiency anemia, unspecified: Secondary | ICD-10-CM | POA: Diagnosis not present

## 2014-11-14 LAB — CBC WITH DIFFERENTIAL/PLATELET
Basophils Absolute: 0 10*3/uL (ref 0.0–0.1)
Basophils Relative: 0 % (ref 0–1)
Eosinophils Absolute: 0.2 10*3/uL (ref 0.0–0.7)
Eosinophils Relative: 4 % (ref 0–5)
HCT: 31.6 % — ABNORMAL LOW (ref 36.0–46.0)
Hemoglobin: 10.5 g/dL — ABNORMAL LOW (ref 12.0–15.0)
Lymphocytes Relative: 37 % (ref 12–46)
Lymphs Abs: 1.7 10*3/uL (ref 0.7–4.0)
MCH: 29.1 pg (ref 26.0–34.0)
MCHC: 33.2 g/dL (ref 30.0–36.0)
MCV: 87.5 fL (ref 78.0–100.0)
MPV: 8.9 fL (ref 8.6–12.4)
Monocytes Absolute: 0.5 10*3/uL (ref 0.1–1.0)
Monocytes Relative: 10 % (ref 3–12)
Neutro Abs: 2.3 10*3/uL (ref 1.7–7.7)
Neutrophils Relative %: 49 % (ref 43–77)
Platelets: 262 10*3/uL (ref 150–400)
RBC: 3.61 MIL/uL — ABNORMAL LOW (ref 3.87–5.11)
RDW: 14.7 % (ref 11.5–15.5)
WBC: 4.6 10*3/uL (ref 4.0–10.5)

## 2014-11-14 LAB — IRON AND TIBC
%SAT: 19 % — ABNORMAL LOW (ref 20–55)
Iron: 51 ug/dL (ref 42–145)
TIBC: 267 ug/dL (ref 250–470)
UIBC: 216 ug/dL (ref 125–400)

## 2014-11-14 LAB — BASIC METABOLIC PANEL
BUN: 20 mg/dL (ref 6–23)
CO2: 25 mEq/L (ref 19–32)
Calcium: 9.8 mg/dL (ref 8.4–10.5)
Chloride: 104 mEq/L (ref 96–112)
Creat: 0.86 mg/dL (ref 0.50–1.10)
Glucose, Bld: 117 mg/dL — ABNORMAL HIGH (ref 70–99)
Potassium: 4.3 mEq/L (ref 3.5–5.3)
Sodium: 141 mEq/L (ref 135–145)

## 2014-11-14 LAB — HEMOGLOBIN A1C
Hgb A1c MFr Bld: 7.3 % — ABNORMAL HIGH (ref <5.7)
Mean Plasma Glucose: 163 mg/dL — ABNORMAL HIGH (ref <117)

## 2014-11-14 NOTE — Patient Instructions (Addendum)
blood tests are being requested for you today.  We'll contact you with results. check your blood sugar once a day.  vary the time of day when you check, between before the 3 meals, and at bedtime.  also check if you have symptoms of your blood sugar being too high or too low.  please keep a record of the readings and bring it to your next appointment here.  You can write it on any piece of paper.  please call us sooner if your blood sugar goes below 70, or if you have a lot of readings over 200. Please come back for a "medicare wellness" appointment in 1 month.   Please continue to avoid the lisinopril-HCTZ pill for now.  Also, please stop taking the bromocriptine pill.

## 2014-11-14 NOTE — Progress Notes (Signed)
Subjective:    Patient ID: [name], female    DOB: Oct 30, 1925, 81 y.o.   MRN: [id number]  HPI  The state of at least three ongoing medical problems is addressed today, with interval history of each noted here: Pt returns for f/u of diabetes mellitus: DM type: 2 Dx'ed: 2671 Complications: CAD Therapy: 2 oral meds GDM: never DKA: never Severe hypoglycemia: never Pancreatitis: never Other: she has never taken insulin; she did not tolerate nateglinide or januvia.   Interval history: no cbg record, but states cbg's are well-controlled Anemia: she takes fe 1-BID. She denies BRBPR Pt was recently admitted for syncopal episode.  She feels better now, but still has cold intolerance.  Past Medical History  Diagnosis Date  . Arrhythmia     paroxysmal atrial fibrillation  . Coronary artery disease   . Hypertension   . Hyperlipidemia   . Edema   . GERD (gastroesophageal reflux disease)   . Anemia B twelve deficiency   . Dizziness - giddy   . Abdominal pain   . Iron deficiency anemia, unspecified   . Pernicious anemia   . Thoracic or lumbosacral neuritis or radiculitis, unspecified   . Symptomatic menopausal or female climacteric states   . Lumbago   . Benign neoplasm of colon   . Allergic rhinitis, cause unspecified   . Type II diabetes mellitus   . Degeneration of lumbar or lumbosacral intervertebral disc   . Arthritis     "arms and hands" (06/30/2013)    Past Surgical History  Procedure Laterality Date  . Back surgery    . Cholecystectomy    . Appendectomy  1974  . Abdominal hysterectomy  1974  . Carpal tunnel release Bilateral 1970's  . Cataract extraction, bilateral Bilateral   . Cardiac catheterization    . Coronary angioplasty with stent placement      "1" (06/30/2013  . Lumbar disc surgery      History   Social History  . Marital Status: Widowed    Spouse Name: N/A    Number of Children: N/A  . Years of Education: N/A   Occupational History  .  Not on file.   Social History Main Topics  . Smoking status: Never Smoker   . Smokeless tobacco: Current User    Types: Snuff  . Alcohol Use: No  . Drug Use: No  . Sexual Activity: No   Other Topics Concern  . Not on file   Social History Narrative    Current Outpatient Prescriptions on File Prior to Visit  Medication Sig Dispense Refill  . aspirin 81 MG EC tablet Take 81 mg by mouth daily.      . calcium carbonate (TUMS - DOSED IN MG ELEMENTAL CALCIUM) 500 MG chewable tablet Chew 1 tablet by mouth as needed for heartburn.    . iron polysaccharides (FERREX 150) 150 MG capsule Take 150 mg by mouth 2 (two) times daily.    [name] loratadine-pseudoephedrine (CLARITIN-D 24-HOUR) 10-240 MG per 24 hr tablet Take 1 tablet by mouth. 1 daily as needed for congestion     . metFORMIN (GLUCOPHAGE-XR) 500 MG 24 hr tablet TAKE ONE TABLET BY MOUTH TWICE DAILY 60 tablet 0  . metoprolol succinate (TOPROL-XL) 50 MG 24 hr tablet TAKE 1 TABLET (50 MG TOTAL) BY MOUTH DAILY. TAKE WITH OR IMMEDIATELY FOLLOWING A MEAL. 30 tablet 4  . nitroGLYCERIN (NITROSTAT) 0.4 MG SL tablet Place 0.4 mg under the tongue every 5 (five) minutes as needed.      [name]  omeprazole (PRILOSEC) 40 MG capsule TAKE 1 CAPSULE (40 MG TOTAL) BY MOUTH DAILY. 30 capsule 11  . pravastatin (PRAVACHOL) 40 MG tablet TAKE 1 TABLET (40 MG TOTAL) BY MOUTH AT BEDTIME. 30 tablet 6  . traMADol (ULTRAM) 50 MG tablet Take 1 tablet (50 mg total) by mouth every 6 (six) hours as needed. 30 tablet 0   No current facility-administered medications on file prior to visit.    Allergies  Allergen Reactions  . Penicillins Swelling    REACTION: swelling of joints  . Sulfonamide Derivatives Other (See Comments)    REACTION: "like my head was full of water"  . Albumin (Human) Other (See Comments)    Doesn't remember   . Clindamycin Other (See Comments)    Doesn't remember   . Iodides Hives  . Iodinated Diagnostic Agents Hives  . Sulfa Antibiotics Hives  .  Celecoxib Itching and Rash  . Erythromycin Itching and Rash  . Nitrofuran Derivatives Rash  . Nitrofurantoin Itching and Rash  . Piroxicam Other (See Comments)    unknown  . Povidone-Iodine Itching and Rash    No family history on file.  BP 132/70 mmHg  Pulse 95  Temp(Src) 97.6 F (36.4 C) (Oral)  Ht 5\' 1"  (1.549 m)  Wt 116 lb (52.617 kg)  BMI 21.93 kg/m2  SpO2 93%    Review of Systems Denies dizziness and hematuria    Objective:   Physical Exam Vital signs: see vs page Gen: elderly, frail, no distress Pulses: dorsalis pedis intact bilat.   MSK: no deformity of the feet CV: no leg edema Skin:  no ulcer on the feet.  normal color and temp on the feet. Neuro: sensation is intact to touch on the feet. Gait:  Normal and steady.      Lab Results  Component Value Date   CREATININE 0.86 11/14/2014   BUN 20 11/14/2014   NA 141 11/14/2014   K 4.3 11/14/2014   CL 104 11/14/2014   CO2 25 11/14/2014  . Lab Results  Component Value Date   WBC 4.6 11/14/2014   HGB 10.5* 11/14/2014   HCT 31.6* 11/14/2014   MCV 87.5 11/14/2014   PLT 262 11/14/2014   Lab Results  Component Value Date   HGBA1C 7.3* 11/14/2014       Assessment & Plan:  Anemia, improved. HTN: well-controlled on reduced rx Syncope.  Parlodel could have contributed to this. DM: giver her advanced age, she can tolerate discontinuation of parlodel for now.   Patient is advised the following: Patient Instructions  blood tests are being requested for you today.  We'll contact you with results. check your blood sugar once a day.  vary the time of day when you check, between before the 3 meals, and at bedtime.  also check if you have symptoms of your blood sugar being too high or too low.  please keep a record of the readings and bring it to your next appointment here.  You can write it on any piece of paper.  please call us sooner if your blood sugar goes below 70, or if you have a lot of readings over  200. Please come back for a "medicare wellness" appointment in 1 month.   Please continue to avoid the lisinopril-HCTZ pill for now.  Also, please stop taking the bromocriptine pill.

## 2014-11-25 ENCOUNTER — Other Ambulatory Visit: Payer: Self-pay | Admitting: Endocrinology

## 2014-11-29 DIAGNOSIS — H401231 Low-tension glaucoma, bilateral, mild stage: Secondary | ICD-10-CM | POA: Diagnosis not present

## 2014-12-15 ENCOUNTER — Ambulatory Visit: Payer: Medicare Other | Admitting: Endocrinology

## 2014-12-17 ENCOUNTER — Encounter: Payer: Self-pay | Admitting: Internal Medicine

## 2014-12-23 DIAGNOSIS — N281 Cyst of kidney, acquired: Secondary | ICD-10-CM | POA: Diagnosis not present

## 2014-12-26 ENCOUNTER — Other Ambulatory Visit: Payer: Self-pay | Admitting: Endocrinology

## 2014-12-27 DIAGNOSIS — N390 Urinary tract infection, site not specified: Secondary | ICD-10-CM | POA: Diagnosis not present

## 2014-12-30 ENCOUNTER — Other Ambulatory Visit: Payer: Self-pay | Admitting: Endocrinology

## 2015-01-06 DIAGNOSIS — H401211 Low-tension glaucoma, right eye, mild stage: Secondary | ICD-10-CM | POA: Diagnosis not present

## 2015-01-06 DIAGNOSIS — H40053 Ocular hypertension, bilateral: Secondary | ICD-10-CM | POA: Diagnosis not present

## 2015-01-06 DIAGNOSIS — H401231 Low-tension glaucoma, bilateral, mild stage: Secondary | ICD-10-CM | POA: Diagnosis not present

## 2015-01-17 ENCOUNTER — Other Ambulatory Visit: Payer: Self-pay | Admitting: Cardiovascular Disease

## 2015-01-27 DIAGNOSIS — H401221 Low-tension glaucoma, left eye, mild stage: Secondary | ICD-10-CM | POA: Diagnosis not present

## 2015-02-20 ENCOUNTER — Telehealth: Payer: Self-pay | Admitting: Endocrinology

## 2015-02-20 NOTE — Telephone Encounter (Signed)
See note below, Thanks! 

## 2015-02-20 NOTE — Telephone Encounter (Signed)
Patients daughter called stating that this weekend she noticed her dementia symptoms were worse  Daughter states this been ongoing for past 2 years   Patient has upcoming appointment this Friday and would like Dr. Loanne Drilling aware before arrival   Thank you

## 2015-02-24 ENCOUNTER — Ambulatory Visit (INDEPENDENT_AMBULATORY_CARE_PROVIDER_SITE_OTHER): Payer: Medicare Other | Admitting: Endocrinology

## 2015-02-24 ENCOUNTER — Ambulatory Visit
Admission: RE | Admit: 2015-02-24 | Discharge: 2015-02-24 | Disposition: A | Discharge status: Home / Self Care | Payer: Medicare Other | Source: Ambulatory Visit | Attending: Endocrinology | Admitting: Endocrinology

## 2015-02-24 ENCOUNTER — Ambulatory Visit: Payer: Medicare Other | Admitting: Internal Medicine

## 2015-02-24 ENCOUNTER — Encounter: Payer: Self-pay | Admitting: Endocrinology

## 2015-02-24 VITALS — BP 132/70 | HR 74 | Temp 97.4°F | Ht 61.0 in | Wt 114.0 lb

## 2015-02-24 DIAGNOSIS — M79605 Pain in left leg: Secondary | ICD-10-CM | POA: Diagnosis not present

## 2015-02-24 DIAGNOSIS — M545 Low back pain: Secondary | ICD-10-CM

## 2015-02-24 DIAGNOSIS — E785 Hyperlipidemia, unspecified: Secondary | ICD-10-CM | POA: Diagnosis not present

## 2015-02-24 DIAGNOSIS — M79604 Pain in right leg: Secondary | ICD-10-CM | POA: Diagnosis not present

## 2015-02-24 DIAGNOSIS — Z0000 Encounter for general adult medical examination without abnormal findings: Secondary | ICD-10-CM | POA: Diagnosis not present

## 2015-02-24 DIAGNOSIS — E871 Hypo-osmolality and hyponatremia: Secondary | ICD-10-CM | POA: Diagnosis not present

## 2015-02-24 DIAGNOSIS — R413 Other amnesia: Secondary | ICD-10-CM | POA: Diagnosis not present

## 2015-02-24 DIAGNOSIS — D509 Iron deficiency anemia, unspecified: Secondary | ICD-10-CM

## 2015-02-24 DIAGNOSIS — E119 Type 2 diabetes mellitus without complications: Secondary | ICD-10-CM | POA: Diagnosis not present

## 2015-02-24 DIAGNOSIS — Z23 Encounter for immunization: Secondary | ICD-10-CM | POA: Diagnosis not present

## 2015-02-24 LAB — HEPATIC FUNCTION PANEL
ALT: 10 U/L (ref 0–35)
AST: 16 U/L (ref 0–37)
Albumin: 3.9 g/dL (ref 3.5–5.2)
Alkaline Phosphatase: 76 U/L (ref 39–117)
Bilirubin, Direct: 0.1 mg/dL (ref 0.0–0.3)
Total Bilirubin: 0.7 mg/dL (ref 0.2–1.2)
Total Protein: 6.9 g/dL (ref 6.0–8.3)

## 2015-02-24 LAB — CBC WITH DIFFERENTIAL/PLATELET
Basophils Absolute: 0 10*3/uL (ref 0.0–0.1)
Basophils Relative: 0.5 % (ref 0.0–3.0)
Eosinophils Absolute: 0.2 10*3/uL (ref 0.0–0.7)
Eosinophils Relative: 5.2 % — ABNORMAL HIGH (ref 0.0–5.0)
HCT: 31.2 % — ABNORMAL LOW (ref 36.0–46.0)
Hemoglobin: 10.3 g/dL — ABNORMAL LOW (ref 12.0–15.0)
Lymphocytes Relative: 32.4 % (ref 12.0–46.0)
Lymphs Abs: 1.5 10*3/uL (ref 0.7–4.0)
MCHC: 33.1 g/dL (ref 30.0–36.0)
MCV: 87.8 fl (ref 78.0–100.0)
Monocytes Absolute: 0.5 10*3/uL (ref 0.1–1.0)
Monocytes Relative: 11.4 % (ref 3.0–12.0)
Neutro Abs: 2.4 10*3/uL (ref 1.4–7.7)
Neutrophils Relative %: 50.5 % (ref 43.0–77.0)
Platelets: 217 10*3/uL (ref 150.0–400.0)
RBC: 3.56 Mil/uL — ABNORMAL LOW (ref 3.87–5.11)
RDW: 14.1 % (ref 11.5–15.5)
WBC: 4.8 10*3/uL (ref 4.0–10.5)

## 2015-02-24 LAB — HEMOGLOBIN A1C: Hgb A1c MFr Bld: 7.5 % — ABNORMAL HIGH (ref 4.6–6.5)

## 2015-02-24 LAB — BASIC METABOLIC PANEL
BUN: 22 mg/dL (ref 6–23)
CO2: 25 mEq/L (ref 19–32)
Calcium: 9.5 mg/dL (ref 8.4–10.5)
Chloride: 105 mEq/L (ref 96–112)
Creatinine, Ser: 0.84 mg/dL (ref 0.40–1.20)
GFR: 82.25 mL/min (ref >60.00)
Glucose, Bld: 121 mg/dL — ABNORMAL HIGH (ref 70–99)
Potassium: 3.7 mEq/L (ref 3.5–5.1)
Sodium: 138 mEq/L (ref 135–145)

## 2015-02-24 LAB — LIPID PANEL
Cholesterol: 175 mg/dL (ref 0–200)
HDL: 59 mg/dL (ref >39.00)
LDL Cholesterol: 82 mg/dL (ref 0–99)
NonHDL: 116
Total CHOL/HDL Ratio: 3
Triglycerides: 168 mg/dL — ABNORMAL HIGH (ref 0.0–149.0)
VLDL: 33.6 mg/dL (ref 0.0–40.0)

## 2015-02-24 LAB — IBC PANEL
Iron: 59 ug/dL (ref 42–145)
Saturation Ratios: 19.5 % — ABNORMAL LOW (ref 20.0–50.0)
Transferrin: 216 mg/dL (ref 212.0–360.0)

## 2015-02-24 LAB — SEDIMENTATION RATE: Sed Rate: 42 mm/hr — ABNORMAL HIGH (ref 0–22)

## 2015-02-24 LAB — VITAMIN B12: Vitamin B-12: 165 pg/mL — ABNORMAL LOW (ref 211–911)

## 2015-02-24 LAB — TSH: TSH: 3.18 u[IU]/mL (ref 0.35–4.50)

## 2015-02-24 MED ORDER — TRAMADOL HCL 50 MG PO TABS: 50.0000 mg | ORAL_TABLET | Freq: Four times a day (QID) | ORAL | Status: DC | PRN

## 2015-02-24 MED ORDER — DONEPEZIL HCL 5 MG PO TABS: 5.0000 mg | ORAL_TABLET | Freq: Every day | ORAL | Status: DC

## 2015-02-24 MED ORDER — DICLOFENAC SODIUM 1 % TD GEL: 4.0000 g | Freq: Four times a day (QID) | TRANSDERMAL | Status: DC

## 2015-02-24 NOTE — Progress Notes (Signed)
we discussed code status.  pt requests full code, but would not want to be started or maintained on artificial life-support measures if there was not a reasonable chance of recovery 

## 2015-02-24 NOTE — Patient Instructions (Addendum)
Here is a prescription, to refill the tramadol. i have sent 3 prescriptions to your pharmacy: for the memory, and for an anti-pain gel. Please stop taking the pravastatin.  Please come back for a follow-up appointment in 1 month.  We'll do the tetanus shot then.  good diet and exercise significantly improve your health.  please let me know if you wish to be referred to a dietician.  high blood sugar is very risky to your health.  you should see an eye doctor and dentist every year.  It is very important to get all recommended vaccinations.  please consider these measures for your health:  minimize alcohol.  do not use tobacco products.  have a colonoscopy at least every 10 years from age 9.  Women should have an annual mammogram from age 74.  keep firearms safely stored.  always use seat belts.  have working smoke alarms in your home.  see an eye doctor and dentist regularly.  never drive under the influence of alcohol or drugs (including prescription drugs).   it is critically important to prevent falling down (keep floor areas well-lit, dry, and free of loose objects.  If you have a cane, walker, or wheelchair, you should use it, even for short trips around the house.  Also, try not to rush).

## 2015-02-24 NOTE — Progress Notes (Signed)
Subjective:    Patient ID: [name], female    DOB: [birth year], 81 y.o.   MRN: [id number]  HPI Pt states few weeks of intermittent moderate pain at the lower back, but no assoc numbness.  Sometimes, the pain radiates down the back of each leg.  She had back surgery many years ago.  She is unable to cite precip factor.   Past Medical History  Diagnosis Date  . Arrhythmia     paroxysmal atrial fibrillation  . Coronary artery disease   . Hypertension   . Hyperlipidemia   . Edema   . GERD (gastroesophageal reflux disease)   . Anemia B twelve deficiency   . Dizziness - giddy   . Abdominal pain   . Iron deficiency anemia, unspecified   . Pernicious anemia   . Thoracic or lumbosacral neuritis or radiculitis, unspecified   . Symptomatic menopausal or female climacteric states   . Lumbago   . Benign neoplasm of colon   . Allergic rhinitis, cause unspecified   . Type II diabetes mellitus   . Degeneration of lumbar or lumbosacral intervertebral disc   . Arthritis     "arms and hands" (06/30/2013)    Past Surgical History  Procedure Laterality Date  . Back surgery    . Cholecystectomy    . Appendectomy  1974  . Abdominal hysterectomy  1974  . Carpal tunnel release Bilateral 1970's  . Cataract extraction, bilateral Bilateral   . Cardiac catheterization    . Coronary angioplasty with stent placement      "1" (06/30/2013  . Lumbar disc surgery      History   Social History  . Marital Status: Widowed    Spouse Name: N/A  . Number of Children: N/A  . Years of Education: N/A   Occupational History  . Not on file.   Social History Main Topics  . Smoking status: Never Smoker   . Smokeless tobacco: Current User    Types: Snuff  . Alcohol Use: No  . Drug Use: No  . Sexual Activity: No   Other Topics Concern  . Not on file   Social History Narrative    Current Outpatient Prescriptions on File Prior to Visit  Medication Sig Dispense Refill  . aspirin 81 MG EC  tablet Take 81 mg by mouth daily.      . calcium carbonate (TUMS - DOSED IN MG ELEMENTAL CALCIUM) 500 MG chewable tablet Chew 1 tablet by mouth as needed for heartburn.    . iron polysaccharides (FERREX 150) 150 MG capsule Take 150 mg by mouth 2 (two) times daily.    [name] loratadine-pseudoephedrine (CLARITIN-D 24-HOUR) 10-240 MG per 24 hr tablet Take 1 tablet by mouth. 1 daily as needed for congestion     . metFORMIN (GLUCOPHAGE-XR) 500 MG 24 hr tablet TAKE ONE TABLET BY MOUTH TWICE DAILY 60 tablet 0  . metoprolol succinate (TOPROL-XL) 50 MG 24 hr tablet TAKE 1 TABLET (50 MG TOTAL) BY MOUTH DAILY. TAKE WITH OR IMMEDIATELY FOLLOWING A MEAL. 30 tablet 2  . nitroGLYCERIN (NITROSTAT) 0.4 MG SL tablet Place 0.4 mg under the tongue every 5 (five) minutes as needed.       No current facility-administered medications on file prior to visit.    Allergies  Allergen Reactions  . Penicillins Swelling    REACTION: swelling of joints  . Sulfonamide Derivatives Other (See Comments)    REACTION: "like my head was full of water"  . Albumin (Human) Other (  See Comments)    Doesn't remember   . Clindamycin Other (See Comments)    Doesn't remember   . Iodides Hives  . Iodinated Diagnostic Agents Hives  . Sulfa Antibiotics Hives  . Celecoxib Itching and Rash  . Erythromycin Itching and Rash  . Nitrofuran Derivatives Rash  . Nitrofurantoin Itching and Rash  . Piroxicam Other (See Comments)    unknown  . Povidone-Iodine Itching and Rash    No family history on file.  BP 132/70 mmHg  Pulse 74  Temp(Src) 97.4 F (36.3 C) (Oral)  Ht 5\' 1"  (1.549 m)  Wt 114 lb (51.71 kg)  BMI 21.55 kg/m2  SpO2 97%    Review of Systems Denies bowel or bladder retention.  Memory loss persists.    Objective:   Physical Exam VITAL SIGNS:  See vs page GENERAL: no distress Lower back: nontender.  Old healed surgical scar.      X-rays: OA  Lab Results  Component Value Date   WBC 4.8 02/24/2015   HGB 10.3*  02/24/2015   HCT 31.2* 02/24/2015   MCV 87.8 02/24/2015   PLT 217.0 02/24/2015   B-12=low fe=low    Assessment & Plan:  fe-def anemia.  She needs increased rx Low-back pain: worse:  b-12 deficiency: persistent Memory loss, worse  Here is a prescription, to refill the tramadol. i have sent 3 prescriptions to your pharmacy: for the memory, and for an anti-pain gel. increase iron pills to twice a day. You should take vitamin B-12 injection once per month. Please make nurse appt for first injection  Subjective:   Patient here for Medicare annual wellness visit and management of other chronic and acute problems.     Risk factors: advanced age    80 of Physicians Providing Medical Care to Patient:  See "snapshot"   Activities of Daily Living: In your present state of health, do you have any difficulty performing the following activities (lives with dtr)?:  Preparing food and eating?: No  Bathing yourself: No  Getting dressed: No  Using the toilet:No  Moving around from place to place: No  In the past year have you fallen or had a near fall?:No    Home Safety: Has smoke detector and wears seat belts. firearms are safely stored.  Diet and Exercise  Current exercise habits: good, as limited by advanced age Dietary issues discussed: pt reports a healthy diet   Depression Screen  Q1: Over the past two weeks, have you felt down, depressed or hopeless?no  Q2: Over the past two weeks, have you felt little interest or pleasure in doing things? no   The following portions of the patient's history were reviewed and updated as appropriate: allergies, current medications, past family history, past medical history, past social history, past surgical history and problem list.   Review of Systems  Denies hearing loss, and visual loss Objective:   Vision:  Sees opthalmologist Hearing: grossly normal Body mass index:  See vs page Msk: pt easily and quickly performs "get-up-and-go"  from a sitting position Cognitive Impairment Assessment: cognition, memory and judgment appear normal.  remembers 0/3 at 5 minutes.  excellent recall.  can easily read and write a sentence.  alert and oriented x to self and 2016 (says it is march).     Assessment:   Medicare wellness utd on preventive parameters    Plan:   During the course of the visit the patient was educated and counseled about appropriate screening and preventive services including:  Fall prevention   Screening mammography  Bone densitometry screening  Diabetes screening  Nutrition counseling   Vaccines / LABS Zostavax / Pneumococcal Vaccine  today    Patient Instructions (the written plan) was given to the patient.

## 2015-03-03 ENCOUNTER — Telehealth: Payer: Self-pay | Admitting: Endocrinology

## 2015-03-03 NOTE — Telephone Encounter (Signed)
Patients daughter [name] called stating [name] had an injection and she is complaining of discomfort at the injection site  + swelling  + redness - warm to touch   Please advise patient if this is normal reaction   Thank you

## 2015-03-03 NOTE — Telephone Encounter (Signed)
I contacted pt and advised that the symptoms listed below are all normal symptoms. Patient voiced understanding.

## 2015-03-07 ENCOUNTER — Ambulatory Visit (INDEPENDENT_AMBULATORY_CARE_PROVIDER_SITE_OTHER): Payer: Medicare Other

## 2015-03-07 DIAGNOSIS — D518 Other vitamin B12 deficiency anemias: Secondary | ICD-10-CM | POA: Diagnosis not present

## 2015-03-07 MED ORDER — CYANOCOBALAMIN 1000 MCG/ML IJ SOLN
1000.0000 ug | Freq: Once | INTRAMUSCULAR | Status: AC
  Administered 2015-03-07: 1000 ug via INTRAMUSCULAR

## 2015-03-10 ENCOUNTER — Encounter: Payer: Self-pay | Admitting: Endocrinology

## 2015-03-13 ENCOUNTER — Other Ambulatory Visit: Payer: Self-pay | Admitting: Cardiovascular Disease

## 2015-03-14 ENCOUNTER — Other Ambulatory Visit: Payer: Self-pay | Admitting: Endocrinology

## 2015-03-22 ENCOUNTER — Ambulatory Visit (INDEPENDENT_AMBULATORY_CARE_PROVIDER_SITE_OTHER): Payer: Medicare Other | Admitting: Cardiovascular Disease

## 2015-03-22 ENCOUNTER — Encounter: Payer: Self-pay | Admitting: Cardiovascular Disease

## 2015-03-22 VITALS — BP 150/80 | HR 74 | Ht 62.0 in | Wt 112.8 lb

## 2015-03-22 DIAGNOSIS — I480 Paroxysmal atrial fibrillation: Secondary | ICD-10-CM

## 2015-03-22 DIAGNOSIS — I10 Essential (primary) hypertension: Secondary | ICD-10-CM | POA: Diagnosis not present

## 2015-03-22 DIAGNOSIS — I2510 Atherosclerotic heart disease of native coronary artery without angina pectoris: Secondary | ICD-10-CM | POA: Diagnosis not present

## 2015-03-22 DIAGNOSIS — E785 Hyperlipidemia, unspecified: Secondary | ICD-10-CM | POA: Diagnosis not present

## 2015-03-22 NOTE — Progress Notes (Signed)
Chief Complaint  Patient presents with  . Coronary Artery Disease    History of Present Illness: 81 yo female with history of CAD, atrial fibrillation, HTN, HLD, syncope who is here today for cardiac follow up. She has been followed in the past by Dr. Verl Blalock. Last cath April 2011 with bare metal stent patent in LAD, moderate disease in mid Circumflex, mild disease RCA. BP elevated at visit here March 2015 and Lisinopril/HCTZ was increased. She has not been on anti-coagulation due to advanced age and fall risk. She has maintained sinus rhythm for the last several years.   She tells me that she feels well. No chest pain or SOB. No palpitations.   Primary Care Physician: Loanne Drilling  Last Lipid Profile:Lipid Panel     Component Value Date/Time   CHOL 175 02/24/2015 1135   TRIG 168.0* 02/24/2015 1135   TRIG 80 09/04/2006 0837   HDL 59.00 02/24/2015 1135   CHOLHDL 3 02/24/2015 1135   CHOLHDL 3.9 CALC 09/04/2006 0837   VLDL 33.6 02/24/2015 1135   LDLCALC 82 02/24/2015 1135     Past Medical History  Diagnosis Date  . Arrhythmia     paroxysmal atrial fibrillation  . Coronary artery disease   . Hypertension   . Hyperlipidemia   . Edema   . GERD (gastroesophageal reflux disease)   . Anemia B twelve deficiency   . Dizziness - giddy   . Abdominal pain   . Iron deficiency anemia, unspecified   . Pernicious anemia   . Thoracic or lumbosacral neuritis or radiculitis, unspecified   . Symptomatic menopausal or female climacteric states   . Lumbago   . Benign neoplasm of colon   . Allergic rhinitis, cause unspecified   . Type II diabetes mellitus   . Degeneration of lumbar or lumbosacral intervertebral disc   . Arthritis     "arms and hands" (06/30/2013)    Past Surgical History  Procedure Laterality Date  . Back surgery    . Cholecystectomy    . Appendectomy  1974  . Abdominal hysterectomy  1974  . Carpal tunnel release Bilateral 1970's  . Cataract extraction, bilateral  Bilateral   . Cardiac catheterization    . Coronary angioplasty with stent placement      "1" (06/30/2013  . Lumbar disc surgery      Current Outpatient Prescriptions  Medication Sig Dispense Refill  . aspirin 81 MG EC tablet Take 81 mg by mouth daily.      . calcium carbonate (TUMS - DOSED IN MG ELEMENTAL CALCIUM) 500 MG chewable tablet Chew 1 tablet by mouth as needed for heartburn.    . cyanocobalamin (,VITAMIN B-12,) 1000 MCG/ML injection Inject 1,000 mcg into the muscle every 30 (thirty) days.    . diclofenac sodium (VOLTAREN) 1 % GEL Apply 4 g topically 4 (four) times daily. As needed for pain 100 g 5  . donepezil (ARICEPT) 5 MG tablet Take 1 tablet (5 mg total) by mouth at bedtime. 30 tablet 11  . iron polysaccharides (FERREX 150) 150 MG capsule Take 150 mg by mouth 2 (two) times daily.    [name] loratadine-pseudoephedrine (CLARITIN-D 24-HOUR) 10-240 MG per 24 hr tablet Take 1 tablet by mouth. 1 daily as needed for congestion     . metFORMIN (GLUCOPHAGE-XR) 500 MG 24 hr tablet TAKE ONE TABLET BY MOUTH TWICE DAILY 60 tablet 0  . metoprolol succinate (TOPROL-XL) 50 MG 24 hr tablet TAKE 1 TABLET (50 MG TOTAL) BY MOUTH DAILY.  TAKE WITH OR IMMEDIATELY FOLLOWING A MEAL. 30 tablet 2  . nitroGLYCERIN (NITROSTAT) 0.4 MG SL tablet Place 0.4 mg under the tongue every 5 (five) minutes as needed.      . traMADol (ULTRAM) 50 MG tablet Take 50 mg by mouth every 6 (six) hours as needed (for pain).     No current facility-administered medications for this visit.    Allergies  Allergen Reactions  . Penicillins Swelling    REACTION: swelling of joints  . Sulfonamide Derivatives Other (See Comments)    REACTION: "like my head was full of water"  . Albumin (Human) Other (See Comments)    Doesn't remember   . Clindamycin Other (See Comments)    Doesn't remember   . Iodides Hives  . Iodinated Diagnostic Agents Hives  . Sulfa Antibiotics Hives  . Celecoxib Itching and Rash  . Erythromycin Itching and  Rash  . Nitrofuran Derivatives Rash  . Nitrofurantoin Itching and Rash  . Piroxicam Other (See Comments)    unknown  . Povidone-Iodine Itching and Rash    History   Social History  . Marital Status: Widowed    Spouse Name: N/A  . Number of Children: N/A  . Years of Education: N/A   Occupational History  . Not on file.   Social History Main Topics  . Smoking status: Never Smoker   . Smokeless tobacco: Current User    Types: Snuff  . Alcohol Use: No  . Drug Use: No  . Sexual Activity: No   Other Topics Concern  . Not on file   Social History Narrative    Family History  Problem Relation Age of Onset  . Diabetes Mother   . Cancer Sister     "behind heart"  . Cancer Sister     Review of Systems:  As stated in the HPI and otherwise negative.   BP 150/80 mmHg  Pulse 74  Ht 5\' 2"  (1.575 m)  Wt 112 lb 12.8 oz (51.166 kg)  BMI 20.63 kg/m2  Physical Examination: General: Well developed, well nourished, NAD HEENT: OP clear, mucus membranes moist SKIN: warm, dry. No rashes. Neuro: No focal deficits Musculoskeletal: Muscle strength 5/5 all ext Psychiatric: Mood and affect normal Neck: No JVD, no carotid bruits, no thyromegaly, no lymphadenopathy. Lungs:Clear bilaterally, no wheezes, rhonci, crackles Cardiovascular: Regular rate and rhythm. No murmurs, gallops or rubs. Abdomen:Soft. Bowel sounds present. Non-tender.  Extremities: No lower extremity edema. Pulses are 2 + in the bilateral DP/PT.  EKG:  EKG is not ordered today. The ekg ordered today demonstrates   Recent Labs: 02/24/2015: ALT 10; BUN 22; Creatinine 0.84; Hemoglobin 10.3*; Platelets 217.0; Potassium 3.7; Sodium 138; TSH 3.18   Lipid Panel    Component Value Date/Time   CHOL 175 02/24/2015 1135   TRIG 168.0* 02/24/2015 1135   TRIG 80 09/04/2006 0837   HDL 59.00 02/24/2015 1135   CHOLHDL 3 02/24/2015 1135   CHOLHDL 3.9 CALC 09/04/2006 0837   VLDL 33.6 02/24/2015 1135   LDLCALC 82 02/24/2015  1135   LDLDIRECT 155.3 08/11/2008 1122   LDLDIRECT 139.5 09/04/2006 0837     Wt Readings from Last 3 Encounters:  03/22/15 112 lb 12.8 oz (51.166 kg)  02/24/15 114 lb (51.71 kg)  11/14/14 116 lb (52.617 kg)     Other studies Reviewed: Additional studies/ records that were reviewed today include: . Review of the above records demonstrates:    Assessment and Plan:   1. CAD: Stable. Continue ASA, beta blocker.  Statin stopped due to advanced age.    2. Paroxysmal atrial fibrillation: Maintaining sinus rhythm. She is on ASA. She has not been started on anti-coagulation in the past due to fall risk.   3. HTN: BP controlled at home and at last visit in primary care. No changes today.   4. Hyperlipdemia: Her statin has been stopped in primary care due to advanced age.    Current medicines are reviewed at length with the patient today.  The patient does not have concerns regarding medicines.  The following changes have been made:  no change  Labs/ tests ordered today include:  No orders of the defined types were placed in this encounter.    Disposition:   FU with me in 12  months0  Signed, Lauree Chandler, MD 03/22/2015 2:25 PM    [facility] [place], [place], [place]  [address] Phone: [phone]; Fax: [phone]

## 2015-03-22 NOTE — Patient Instructions (Signed)
Medication Instructions:  Your physician recommends that you continue on your current medications as directed. Please refer to the Current Medication list given to you today.   Labwork: none  Testing/Procedures: none  Follow-Up: Your physician wants you to follow-up in:  12 months.  You will receive a reminder letter in the mail two months in advance. If you don't receive a letter, please call our office to schedule the follow-up appointment.        

## 2015-03-27 ENCOUNTER — Encounter: Payer: Self-pay | Admitting: Endocrinology

## 2015-03-27 ENCOUNTER — Ambulatory Visit (INDEPENDENT_AMBULATORY_CARE_PROVIDER_SITE_OTHER): Payer: Medicare Other | Admitting: Endocrinology

## 2015-03-27 VITALS — BP 138/80 | HR 76 | Temp 97.5°F | Wt 114.0 lb

## 2015-03-27 DIAGNOSIS — D509 Iron deficiency anemia, unspecified: Secondary | ICD-10-CM

## 2015-03-27 LAB — CBC WITH DIFFERENTIAL/PLATELET
Basophils Absolute: 0 10*3/uL (ref 0.0–0.1)
Basophils Relative: 0.7 % (ref 0.0–3.0)
Eosinophils Absolute: 0.3 10*3/uL (ref 0.0–0.7)
Eosinophils Relative: 7.7 % — ABNORMAL HIGH (ref 0.0–5.0)
HCT: 31.5 % — ABNORMAL LOW (ref 36.0–46.0)
Hemoglobin: 10.2 g/dL — ABNORMAL LOW (ref 12.0–15.0)
Lymphocytes Relative: 34.7 % (ref 12.0–46.0)
Lymphs Abs: 1.4 10*3/uL (ref 0.7–4.0)
MCHC: 32.4 g/dL (ref 30.0–36.0)
MCV: 87.1 fl (ref 78.0–100.0)
Monocytes Absolute: 0.5 10*3/uL (ref 0.1–1.0)
Monocytes Relative: 11.7 % (ref 3.0–12.0)
Neutro Abs: 1.8 10*3/uL (ref 1.4–7.7)
Neutrophils Relative %: 45.2 % (ref 43.0–77.0)
Platelets: 222 10*3/uL (ref 150.0–400.0)
RBC: 3.62 Mil/uL — ABNORMAL LOW (ref 3.87–5.11)
RDW: 14.6 % (ref 11.5–15.5)
WBC: 4 10*3/uL (ref 4.0–10.5)

## 2015-03-27 LAB — IBC PANEL
Iron: 64 ug/dL (ref 42–145)
Saturation Ratios: 20.6 % (ref 20.0–50.0)
Transferrin: 222 mg/dL (ref 212.0–360.0)

## 2015-03-27 MED ORDER — TRIAMCINOLONE ACETONIDE 0.1 % EX CREA: 1.0000 "application " | TOPICAL_CREAM | Freq: Four times a day (QID) | CUTANEOUS | Status: DC

## 2015-03-27 MED ORDER — DONEPEZIL HCL 5 MG PO TABS: 5.0000 mg | ORAL_TABLET | Freq: Every day | ORAL | Status: DC

## 2015-03-27 NOTE — Patient Instructions (Addendum)
i have sent a prescription to your pharmacy, for the rash and itching. Please try the memory pill again.  i have sent a prescription to your pharmacy.  blood tests are requested for you today.  We'll let you know about the results. Please come back for a follow-up appointment in 2 months.

## 2015-03-27 NOTE — Progress Notes (Signed)
Subjective:    Patient ID: [name], female    DOB: 1925-01-21, 81 y.o.   MRN: [id number]  HPI Pt states few weeks of moderate rash at the lower back, and assoc itching. Past Medical History  Diagnosis Date  . Arrhythmia     paroxysmal atrial fibrillation  . Coronary artery disease   . Hypertension   . Hyperlipidemia   . Edema   . GERD (gastroesophageal reflux disease)   . Anemia B twelve deficiency   . Dizziness - giddy   . Abdominal pain   . Iron deficiency anemia, unspecified   . Pernicious anemia   . Thoracic or lumbosacral neuritis or radiculitis, unspecified   . Symptomatic menopausal or female climacteric states   . Lumbago   . Benign neoplasm of colon   . Allergic rhinitis, cause unspecified   . Type II diabetes mellitus   . Degeneration of lumbar or lumbosacral intervertebral disc   . Arthritis     "arms and hands" (06/30/2013)    Past Surgical History  Procedure Laterality Date  . Back surgery    . Cholecystectomy    . Appendectomy  1974  . Abdominal hysterectomy  1974  . Carpal tunnel release Bilateral 1970's  . Cataract extraction, bilateral Bilateral   . Cardiac catheterization    . Coronary angioplasty with stent placement      "1" (06/30/2013  . Lumbar disc surgery      History   Social History  . Marital Status: Widowed    Spouse Name: N/A  . Number of Children: N/A  . Years of Education: N/A   Occupational History  . Not on file.   Social History Main Topics  . Smoking status: Never Smoker   . Smokeless tobacco: Current User    Types: Snuff  . Alcohol Use: No  . Drug Use: No  . Sexual Activity: No   Other Topics Concern  . Not on file   Social History Narrative    Current Outpatient Prescriptions on File Prior to Visit  Medication Sig Dispense Refill  . aspirin 81 MG EC tablet Take 81 mg by mouth daily.      . calcium carbonate (TUMS - DOSED IN MG ELEMENTAL CALCIUM) 500 MG chewable tablet Chew 1 tablet by mouth as  needed for heartburn.    . cyanocobalamin (,VITAMIN B-12,) 1000 MCG/ML injection Inject 1,000 mcg into the muscle every 30 (thirty) days.    . diclofenac sodium (VOLTAREN) 1 % GEL Apply 4 g topically 4 (four) times daily. As needed for pain 100 g 5  . iron polysaccharides (FERREX 150) 150 MG capsule Take 150 mg by mouth 2 (two) times daily.    [name] loratadine-pseudoephedrine (CLARITIN-D 24-HOUR) 10-240 MG per 24 hr tablet Take 1 tablet by mouth. 1 daily as needed for congestion     . metFORMIN (GLUCOPHAGE-XR) 500 MG 24 hr tablet TAKE ONE TABLET BY MOUTH TWICE DAILY 60 tablet 0  . metoprolol succinate (TOPROL-XL) 50 MG 24 hr tablet TAKE 1 TABLET (50 MG TOTAL) BY MOUTH DAILY. TAKE WITH OR IMMEDIATELY FOLLOWING A MEAL. 30 tablet 2  . nitroGLYCERIN (NITROSTAT) 0.4 MG SL tablet Place 0.4 mg under the tongue every 5 (five) minutes as needed.      . traMADol (ULTRAM) 50 MG tablet Take 50 mg by mouth every 6 (six) hours as needed (for pain).     No current facility-administered medications on file prior to visit.    Allergies  Allergen Reactions  .  Penicillins Swelling    REACTION: swelling of joints  . Sulfonamide Derivatives Other (See Comments)    REACTION: "like my head was full of water"  . Albumin (Human) Other (See Comments)    Doesn't remember   . Clindamycin Other (See Comments)    Doesn't remember   . Iodides Hives  . Iodinated Diagnostic Agents Hives  . Sulfa Antibiotics Hives  . Celecoxib Itching and Rash  . Erythromycin Itching and Rash  . Nitrofuran Derivatives Rash  . Nitrofurantoin Itching and Rash  . Piroxicam Other (See Comments)    unknown  . Povidone-Iodine Itching and Rash    Family History  Problem Relation Age of Onset  . Diabetes Mother   . Cancer Sister     "behind heart"  . Cancer Sister     BP 138/80 mmHg  Pulse 76  Temp(Src) 97.5 F (36.4 C) (Oral)  Wt 114 lb (51.71 kg)  SpO2 97%    Review of Systems Memory loss is slight improved (she did not  take aricept).  Denies brbpr (she takes fe 1-BID).     Objective:   Physical Exam VITAL SIGNS:  See vs page GENERAL: no distress Skin: moderate eczematous rash at the right lower back.   Lab Results  Component Value Date   IRON 64 03/27/2015   TIBC 267 11/14/2014   FERRITIN 309.9* 09/20/2010    Lab Results  Component Value Date   WBC 4.0 03/27/2015   HGB 10.2* 03/27/2015   HCT 31.5* 03/27/2015   MCV 87.1 03/27/2015   PLT 222.0 03/27/2015      Assessment & Plan:  Rash, new, eczema--does not look like zoster Memory loss, slightly improved, but she needs rx Iron deficiency, improved. Anemia, persistent.  Not completely explained by fe deficiency.  Pt declines B-12 injection today.   Patient is advised the following: Patient Instructions  i have sent a prescription to your pharmacy, for the rash and itching. Please try the memory pill again.  i have sent a prescription to your pharmacy.  blood tests are requested for you today.  We'll let you know about the results. Please come back for a follow-up appointment in 2 months.

## 2015-04-05 DIAGNOSIS — H401231 Low-tension glaucoma, bilateral, mild stage: Secondary | ICD-10-CM | POA: Diagnosis not present

## 2015-04-24 ENCOUNTER — Other Ambulatory Visit: Payer: Self-pay | Admitting: Endocrinology

## 2015-05-26 ENCOUNTER — Ambulatory Visit: Payer: Medicare Other | Admitting: Endocrinology

## 2015-05-30 ENCOUNTER — Encounter: Payer: Self-pay | Admitting: Endocrinology

## 2015-05-30 ENCOUNTER — Ambulatory Visit (INDEPENDENT_AMBULATORY_CARE_PROVIDER_SITE_OTHER): Payer: Medicare Other | Admitting: Endocrinology

## 2015-05-30 VITALS — BP 120/80 | HR 86 | Temp 97.4°F | Resp 16 | Ht 62.0 in | Wt 112.0 lb

## 2015-05-30 DIAGNOSIS — D509 Iron deficiency anemia, unspecified: Secondary | ICD-10-CM

## 2015-05-30 DIAGNOSIS — I2510 Atherosclerotic heart disease of native coronary artery without angina pectoris: Secondary | ICD-10-CM | POA: Diagnosis not present

## 2015-05-30 NOTE — Patient Instructions (Addendum)
Please try the memory pill again.  i have sent a prescription to your pharmacy.  Please come back for a follow-up appointment in 3-4 months.

## 2015-05-30 NOTE — Progress Notes (Signed)
Subjective:    Patient ID: [name], female    DOB: Oct 31, 1925, 81 y.o.   MRN: [id number]  HPI Memory loss persists.  Pt says she hasn't taken aricept.  dtr says she wants pt to take it, though. Past Medical History  Diagnosis Date  . Arrhythmia     paroxysmal atrial fibrillation  . Coronary artery disease   . Hypertension   . Hyperlipidemia   . Edema   . GERD (gastroesophageal reflux disease)   . Anemia B twelve deficiency   . Dizziness - giddy   . Abdominal pain   . Iron deficiency anemia, unspecified   . Pernicious anemia   . Thoracic or lumbosacral neuritis or radiculitis, unspecified   . Symptomatic menopausal or female climacteric states   . Lumbago   . Benign neoplasm of colon   . Allergic rhinitis, cause unspecified   . Type II diabetes mellitus   . Degeneration of lumbar or lumbosacral intervertebral disc   . Arthritis     "arms and hands" (06/30/2013)    Past Surgical History  Procedure Laterality Date  . Back surgery    . Cholecystectomy    . Appendectomy  1974  . Abdominal hysterectomy  1974  . Carpal tunnel release Bilateral 1970's  . Cataract extraction, bilateral Bilateral   . Cardiac catheterization    . Coronary angioplasty with stent placement      "1" (06/30/2013  . Lumbar disc surgery      History   Social History  . Marital Status: Widowed    Spouse Name: N/A  . Number of Children: N/A  . Years of Education: N/A   Occupational History  . Not on file.   Social History Main Topics  . Smoking status: Never Smoker   . Smokeless tobacco: Current User    Types: Snuff  . Alcohol Use: No  . Drug Use: No  . Sexual Activity: No   Other Topics Concern  . Not on file   Social History Narrative    Current Outpatient Prescriptions on File Prior to Visit  Medication Sig Dispense Refill  . aspirin 81 MG EC tablet Take 81 mg by mouth daily.      . calcium carbonate (TUMS - DOSED IN MG ELEMENTAL CALCIUM) 500 MG chewable tablet Chew 1  tablet by mouth as needed for heartburn.    . cyanocobalamin (,VITAMIN B-12,) 1000 MCG/ML injection Inject 1,000 mcg into the muscle every 30 (thirty) days.    . diclofenac sodium (VOLTAREN) 1 % GEL Apply 4 g topically 4 (four) times daily. As needed for pain 100 g 5  . donepezil (ARICEPT) 5 MG tablet Take 1 tablet (5 mg total) by mouth at bedtime. 30 tablet 11  . iron polysaccharides (FERREX 150) 150 MG capsule Take 150 mg by mouth daily.     [name] loratadine-pseudoephedrine (CLARITIN-D 24-HOUR) 10-240 MG per 24 hr tablet Take 1 tablet by mouth. 1 daily as needed for congestion     . metFORMIN (GLUCOPHAGE-XR) 500 MG 24 hr tablet TAKE ONE TABLET BY MOUTH TWICE DAILY 60 tablet 0  . metoprolol succinate (TOPROL-XL) 50 MG 24 hr tablet TAKE 1 TABLET (50 MG TOTAL) BY MOUTH DAILY. TAKE WITH OR IMMEDIATELY FOLLOWING A MEAL. 30 tablet 2  . nitroGLYCERIN (NITROSTAT) 0.4 MG SL tablet Place 0.4 mg under the tongue every 5 (five) minutes as needed.      . traMADol (ULTRAM) 50 MG tablet Take 50 mg by mouth every 6 (six) hours  as needed (for pain).    . triamcinolone cream (KENALOG) 0.1 % Apply 1 application topically 4 (four) times daily. As needed for rash 80 g 3   No current facility-administered medications on file prior to visit.    Allergies  Allergen Reactions  . Penicillins Swelling    REACTION: swelling of joints  . Sulfonamide Derivatives Other (See Comments)    REACTION: "like my head was full of water"  . Albumin (Human) Other (See Comments)    Doesn't remember   . Clindamycin Other (See Comments)    Doesn't remember   . Iodides Hives  . Iodinated Diagnostic Agents Hives  . Sulfa Antibiotics Hives  . Celecoxib Itching and Rash  . Erythromycin Itching and Rash  . Nitrofuran Derivatives Rash  . Nitrofurantoin Itching and Rash  . Piroxicam Other (See Comments)    unknown  . Povidone-Iodine Itching and Rash    Family History  Problem Relation Age of Onset  . Diabetes Mother   . Cancer  Sister     "behind heart"  . Cancer Sister     BP 120/80 mmHg  Pulse 86  Temp(Src) 97.4 F (36.3 C) (Oral)  Resp 16  Ht 5\' 2"  (1.575 m)  Wt 112 lb (50.803 kg)  BMI 20.48 kg/m2  SpO2 96%   Review of Systems Denies depression.      Objective:   Physical Exam VITAL SIGNS:  See vs page GENERAL: no distress PSYCH: Alert; oriented to self and place.  She says it is May, 2016.  Does not appear anxious nor depressed.         Assessment & Plan:  Memory loss: she declines rx.  We discussed, and pt agrees to re-try aricept  Patient is advised the following: Patient Instructions  Please try the memory pill again.  i have sent a prescription to your pharmacy.  Please come back for a follow-up appointment in 3-4 months.

## 2015-06-02 ENCOUNTER — Other Ambulatory Visit: Payer: Self-pay | Admitting: Internal Medicine

## 2015-06-08 ENCOUNTER — Other Ambulatory Visit: Payer: Self-pay | Admitting: Endocrinology

## 2015-06-13 ENCOUNTER — Other Ambulatory Visit: Payer: Self-pay | Admitting: Obstetrics and Gynecology

## 2015-06-13 DIAGNOSIS — Z1231 Encounter for screening mammogram for malignant neoplasm of breast: Secondary | ICD-10-CM

## 2015-06-20 ENCOUNTER — Encounter: Payer: Self-pay | Admitting: Internal Medicine

## 2015-07-11 ENCOUNTER — Encounter (HOSPITAL_COMMUNITY): Payer: Self-pay | Admitting: Emergency Medicine

## 2015-07-11 ENCOUNTER — Emergency Department (HOSPITAL_COMMUNITY): Payer: Medicare Other

## 2015-07-11 ENCOUNTER — Emergency Department (HOSPITAL_COMMUNITY)
Admission: EM | Admit: 2015-07-11 | Discharge: 2015-07-11 | Disposition: A | Discharge status: Home / Self Care | Payer: Medicare Other | Attending: Emergency Medicine | Admitting: Emergency Medicine

## 2015-07-11 DIAGNOSIS — R519 Headache, unspecified: Secondary | ICD-10-CM

## 2015-07-11 DIAGNOSIS — I2510 Atherosclerotic heart disease of native coronary artery without angina pectoris: Secondary | ICD-10-CM | POA: Diagnosis not present

## 2015-07-11 DIAGNOSIS — I10 Essential (primary) hypertension: Secondary | ICD-10-CM | POA: Insufficient documentation

## 2015-07-11 DIAGNOSIS — E119 Type 2 diabetes mellitus without complications: Secondary | ICD-10-CM | POA: Diagnosis not present

## 2015-07-11 DIAGNOSIS — Z862 Personal history of diseases of the blood and blood-forming organs and certain disorders involving the immune mechanism: Secondary | ICD-10-CM | POA: Diagnosis not present

## 2015-07-11 DIAGNOSIS — K219 Gastro-esophageal reflux disease without esophagitis: Secondary | ICD-10-CM | POA: Insufficient documentation

## 2015-07-11 DIAGNOSIS — R51 Headache: Secondary | ICD-10-CM | POA: Diagnosis present

## 2015-07-11 DIAGNOSIS — Z79899 Other long term (current) drug therapy: Secondary | ICD-10-CM | POA: Insufficient documentation

## 2015-07-11 DIAGNOSIS — Z7982 Long term (current) use of aspirin: Secondary | ICD-10-CM | POA: Diagnosis not present

## 2015-07-11 DIAGNOSIS — Z880 Allergy status to penicillin: Secondary | ICD-10-CM | POA: Insufficient documentation

## 2015-07-11 DIAGNOSIS — Z8742 Personal history of other diseases of the female genital tract: Secondary | ICD-10-CM | POA: Insufficient documentation

## 2015-07-11 DIAGNOSIS — Z86010 Personal history of colonic polyps: Secondary | ICD-10-CM | POA: Diagnosis not present

## 2015-07-11 DIAGNOSIS — M1990 Unspecified osteoarthritis, unspecified site: Secondary | ICD-10-CM | POA: Insufficient documentation

## 2015-07-11 DIAGNOSIS — I480 Paroxysmal atrial fibrillation: Secondary | ICD-10-CM | POA: Diagnosis not present

## 2015-07-11 LAB — URINE MICROSCOPIC-ADD ON

## 2015-07-11 LAB — URINALYSIS, ROUTINE W REFLEX MICROSCOPIC
Bilirubin Urine: NEGATIVE
Glucose, UA: NEGATIVE mg/dL
Ketones, ur: NEGATIVE mg/dL
Leukocytes, UA: NEGATIVE
Nitrite: NEGATIVE
Protein, ur: NEGATIVE mg/dL
Specific Gravity, Urine: 1.01 (ref 1.005–1.030)
Urobilinogen, UA: 0.2 mg/dL (ref 0.0–1.0)
pH: 7 (ref 5.0–8.0)

## 2015-07-11 LAB — BASIC METABOLIC PANEL
Anion gap: 8 (ref 5–15)
BUN: 24 mg/dL — ABNORMAL HIGH (ref 6–20)
CO2: 26 mmol/L (ref 22–32)
Calcium: 9.3 mg/dL (ref 8.9–10.3)
Chloride: 105 mmol/L (ref 101–111)
Creatinine, Ser: 0.92 mg/dL (ref 0.44–1.00)
GFR calc Af Amer: >60 mL/min (ref >60)
GFR calc non Af Amer: 54 mL/min — ABNORMAL LOW (ref >60)
Glucose, Bld: 135 mg/dL — ABNORMAL HIGH (ref 65–99)
Potassium: 3.9 mmol/L (ref 3.5–5.1)
Sodium: 139 mmol/L (ref 135–145)

## 2015-07-11 LAB — CBC
HCT: 34.7 % — ABNORMAL LOW (ref 36.0–46.0)
Hemoglobin: 11.4 g/dL — ABNORMAL LOW (ref 12.0–15.0)
MCH: 29.3 pg (ref 26.0–34.0)
MCHC: 32.9 g/dL (ref 30.0–36.0)
MCV: 89.2 fL (ref 78.0–100.0)
Platelets: 221 10*3/uL (ref 150–400)
RBC: 3.89 MIL/uL (ref 3.87–5.11)
RDW: 13.9 % (ref 11.5–15.5)
WBC: 4.3 10*3/uL (ref 4.0–10.5)

## 2015-07-11 MED ORDER — ACETAMINOPHEN 500 MG PO TABS
1000.0000 mg | ORAL_TABLET | Freq: Once | ORAL | Status: AC
  Administered 2015-07-11: 1000 mg via ORAL
  Filled 2015-07-11: qty 2

## 2015-07-11 NOTE — ED Notes (Signed)
Pt states that she has had a headache behind her L eye since yesterday. Hypertensive at 195/79. Alert and oriented. Acting normally per family.

## 2015-07-11 NOTE — ED Provider Notes (Signed)
CSN: [id number]     Arrival date & time 07/11/15  1539 History   First MD Initiated Contact with Patient 07/11/15 1613     Chief Complaint  Patient presents with  . Headache  . Hypertension     (Consider location/radiation/quality/duration/timing/severity/associated sxs/prior Treatment) HPI Comments: Patient with PMH of CAD, HTN, HL, and dementia presents to the ED with a chief complaint of headache.   Patient has dementia and is accompanied by her daughters, but is able to contribute to history.  She states that the headache started yesterday.  She describes it as being behind her eyes and is severe.  She denies any weakness, numbness, or tingling.  Denies any slurred speech.  Her daughters state that she has been acting normally.  It is noted in triage that her BP is high (195/79).  She states that it never runs this high and that she has been compliant in taking her meds.  She denies any other symptoms.  There are no aggravating or alleviating factors.  The history is provided by the patient. No language interpreter was used.    Past Medical History  Diagnosis Date  . Arrhythmia     paroxysmal atrial fibrillation  . Coronary artery disease   . Hypertension   . Hyperlipidemia   . Edema   . GERD (gastroesophageal reflux disease)   . Anemia B twelve deficiency   . Dizziness - giddy   . Abdominal pain   . Iron deficiency anemia, unspecified   . Pernicious anemia   . Thoracic or lumbosacral neuritis or radiculitis, unspecified   . Symptomatic menopausal or female climacteric states   . Lumbago   . Benign neoplasm of colon   . Allergic rhinitis, cause unspecified   . Type II diabetes mellitus   . Degeneration of lumbar or lumbosacral intervertebral disc   . Arthritis     "arms and hands" (06/30/2013)   Past Surgical History  Procedure Laterality Date  . Back surgery    . Cholecystectomy    . Appendectomy  1974  . Abdominal hysterectomy  1974  . Carpal tunnel release  Bilateral 1970's  . Cataract extraction, bilateral Bilateral   . Cardiac catheterization    . Coronary angioplasty with stent placement      "1" (06/30/2013  . Lumbar disc surgery     Family History  Problem Relation Age of Onset  . Diabetes Mother   . Cancer Sister     "behind heart"  . Cancer Sister    Social History  Substance Use Topics  . Smoking status: Never Smoker   . Smokeless tobacco: Current User    Types: Snuff  . Alcohol Use: No   OB History    No data available     Review of Systems  Constitutional: Negative for fever and chills.  Respiratory: Negative for shortness of breath.   Cardiovascular: Negative for chest pain.  Gastrointestinal: Negative for nausea, vomiting, diarrhea and constipation.  Genitourinary: Negative for dysuria.  Neurological: Positive for headaches. Negative for weakness and numbness.      Allergies  Penicillins; Sulfonamide derivatives; Albumin (human); Clindamycin; Iodides; Iodinated diagnostic agents; Sulfa antibiotics; Celecoxib; Erythromycin; Nitrofuran derivatives; Nitrofurantoin; Piroxicam; and Povidone-iodine  Home Medications   Prior to Admission medications   Medication Sig Start Date End Date Taking? Authorizing Provider  aspirin 81 MG EC tablet Take 81 mg by mouth daily.     Yes Historical Provider, MD  calcium carbonate (TUMS - DOSED IN MG ELEMENTAL CALCIUM)  500 MG chewable tablet Chew 1 tablet by mouth as needed for heartburn.   Yes Historical Provider, MD  diclofenac sodium (VOLTAREN) 1 % GEL Apply 4 g topically 4 (four) times daily. As needed for pain 02/24/15  Yes Renato Shin, MD  donepezil (ARICEPT) 5 MG tablet Take 1 tablet (5 mg total) by mouth at bedtime. 03/27/15  Yes Renato Shin, MD  iron polysaccharides (FERREX 150) 150 MG capsule Take 150 mg by mouth daily.    Yes Historical Provider, MD  loratadine-pseudoephedrine (CLARITIN-D 24-HOUR) 10-240 MG per 24 hr tablet Take 1 tablet by mouth daily as needed  (congestion).    Yes Historical Provider, MD  metFORMIN (GLUCOPHAGE-XR) 500 MG 24 hr tablet TAKE ONE TABLET BY MOUTH TWICE DAILY 06/08/15  Yes Renato Shin, MD  metoprolol succinate (TOPROL-XL) 50 MG 24 hr tablet TAKE 1 TABLET (50 MG TOTAL) BY MOUTH DAILY. TAKE WITH OR IMMEDIATELY FOLLOWING A MEAL. 06/02/15  Yes Deboraha Sprang, MD  nitroGLYCERIN (NITROSTAT) 0.4 MG SL tablet Place 0.4 mg under the tongue every 5 (five) minutes as needed.     Yes Historical Provider, MD  omeprazole (PRILOSEC) 40 MG capsule Take 40 mg by mouth daily as needed (heartburn).  05/22/15  Yes Historical Provider, MD  triamcinolone cream (KENALOG) 0.1 % Apply 1 application topically 4 (four) times daily. As needed for rash Patient not taking: Reported on 07/11/2015 03/27/15   Renato Shin, MD   BP 195/79 mmHg  Pulse 69  Temp(Src) 97.9 F (36.6 C) (Oral)  Resp 16  SpO2 100% Physical Exam  Constitutional: She is oriented to person, place, and time. She appears well-developed and well-nourished.  HENT:  Head: Normocephalic and atraumatic.  Eyes: Conjunctivae and EOM are normal. Pupils are equal, round, and reactive to light.  Neck: Normal range of motion. Neck supple.  Cardiovascular: Normal rate and regular rhythm.  Exam reveals no gallop and no friction rub.   No murmur heard. Pulmonary/Chest: Effort normal and breath sounds normal. No respiratory distress. She has no wheezes. She has no rales. She exhibits no tenderness.  Abdominal: Soft. Bowel sounds are normal. She exhibits no distension and no mass. There is no tenderness. There is no rebound and no guarding.  Musculoskeletal: Normal range of motion. She exhibits no edema or tenderness.  Normal ROM and strength  Neurological: She is alert and oriented to person, place, and time.  CN 3-12 intact, normal ROM and strength, speech is clear, movements are goal oriented  Skin: Skin is warm and dry.  Psychiatric: She has a normal mood and affect. Her behavior is normal.  Judgment and thought content normal.  Nursing note and vitals reviewed.   ED Course  Procedures (including critical care time) Results for orders placed or performed during the hospital encounter of 07/11/15  CBC  Result Value Ref Range   WBC 4.3 4.0 - 10.5 K/uL   RBC 3.89 3.87 - 5.11 MIL/uL   Hemoglobin 11.4 (L) 12.0 - 15.0 g/dL   HCT 34.7 (L) 36.0 - 46.0 %   MCV 89.2 78.0 - 100.0 fL   MCH 29.3 26.0 - 34.0 pg   MCHC 32.9 30.0 - 36.0 g/dL   RDW 13.9 11.5 - 15.5 %   Platelets 221 150 - 400 K/uL  Basic metabolic panel  Result Value Ref Range   Sodium 139 135 - 145 mmol/L   Potassium 3.9 3.5 - 5.1 mmol/L   Chloride 105 101 - 111 mmol/L   CO2 26 22 -  32 mmol/L   Glucose, Bld 135 (H) 65 - 99 mg/dL   BUN 24 (H) 6 - 20 mg/dL   Creatinine, Ser 0.92 0.44 - 1.00 mg/dL   Calcium 9.3 8.9 - 10.3 mg/dL   GFR calc non Af Amer 54 (L) >60 mL/min   GFR calc Af Amer >60 >60 mL/min   Anion gap 8 5 - 15  Urinalysis, Routine w reflex microscopic (not at [facility])  Result Value Ref Range   Color, Urine YELLOW YELLOW   APPearance CLEAR CLEAR   Specific Gravity, Urine 1.010 1.005 - 1.030   pH 7.0 5.0 - 8.0   Glucose, UA NEGATIVE NEGATIVE mg/dL   Hgb urine dipstick TRACE (A) NEGATIVE   Bilirubin Urine NEGATIVE NEGATIVE   Ketones, ur NEGATIVE NEGATIVE mg/dL   Protein, ur NEGATIVE NEGATIVE mg/dL   Urobilinogen, UA 0.2 0.0 - 1.0 mg/dL   Nitrite NEGATIVE NEGATIVE   Leukocytes, UA NEGATIVE NEGATIVE  Urine microscopic-add on  Result Value Ref Range   Squamous Epithelial / LPF RARE RARE   WBC, UA 0-2 <3 WBC/hpf   Ct Head Wo Contrast  07/11/2015   CLINICAL DATA:  Headache behind LEFT eye since yesterday, hypertensive, history type II diabetes mellitus, hypertension, coronary artery disease, GERD  EXAM: CT HEAD WITHOUT CONTRAST  TECHNIQUE: Contiguous axial images were obtained from the base of the skull through the vertex without intravenous contrast.  COMPARISON:  10/31/2014  FINDINGS: Generalized  atrophy.  Stable ventral morphology.  No midline shift or mass effect.  Mild small vessel chronic ischemic changes of deep cerebral white matter.  No intracranial hemorrhage, mass lesion or evidence of acute infarction.  No extra-axial fluid collections.  Mucosal thickening in a few ethmoid air cells.  Visualized paranasal sinuses mastoid air cells otherwise clear.  Atherosclerotic calcifications of the carotid siphons bilaterally.  No acute osseous findings.  IMPRESSION: Atrophy with small vessel chronic ischemic changes of deep cerebral white matter.  No acute intracranial abnormalities.   Electronically Signed   By: Lavonia Dana M.D.   On: 07/11/2015 17:24    I have personally reviewed and evaluated these images and lab results as part of my medical decision-making.    MDM   Final diagnoses:  Headache, unspecified headache type  Essential hypertension    Patient with HA and HTN.  Patient has been compliant with her meds and states that her BP doesn't normally run this high.  Will recheck BP and consider giving some labetalol.  Will check head CT given that she doesn't have a hx of headaches.  6:31 PM Patient seen by and discussed with Dr. Ralene Bathe, who states that patient is symptom-free. Recommend discharge to home with close follow-up with primary care provider. Patient encouraged to take her blood pressure 2 times per day. Tylenol for headache.    Montine Circle, PA-C 07/11/15 [place], MD 07/11/15 2234

## 2015-07-11 NOTE — Discharge Instructions (Signed)
Please take your BLOOD PRESSURE 2x a day.  Please FOLLOW-UP with your Primary Care Provider  Hypertension Hypertension, commonly called high blood pressure, is when the force of blood pumping through your arteries is too strong. Your arteries are the blood vessels that carry blood from your heart throughout your body. A blood pressure reading consists of a higher number over a lower number, such as 110/72. The higher number (systolic) is the pressure inside your arteries when your heart pumps. The lower number (diastolic) is the pressure inside your arteries when your heart relaxes. Ideally you want your blood pressure below 120/80. Hypertension forces your heart to work harder to pump blood. Your arteries may become narrow or stiff. Having hypertension puts you at risk for heart disease, stroke, and other problems.  RISK FACTORS Some risk factors for high blood pressure are controllable. Others are not.  Risk factors you cannot control include:   Race. You may be at higher risk if you are African American.  Age. Risk increases with age.  Gender. Men are at higher risk than women before age 79 years. After age 33, women are at higher risk than men. Risk factors you can control include:  Not getting enough exercise or physical activity.  Being overweight.  Getting too much fat, sugar, calories, or salt in your diet.  Drinking too much alcohol. SIGNS AND SYMPTOMS Hypertension does not usually cause signs or symptoms. Extremely high blood pressure (hypertensive crisis) may cause headache, anxiety, shortness of breath, and nosebleed. DIAGNOSIS  To check if you have hypertension, your health care provider will measure your blood pressure while you are seated, with your arm held at the level of your heart. It should be measured at least twice using the same arm. Certain conditions can cause a difference in blood pressure between your right and left arms. A blood pressure reading that is higher  than normal on one occasion does not mean that you need treatment. If one blood pressure reading is high, ask your health care provider about having it checked again. TREATMENT  Treating high blood pressure includes making lifestyle changes and possibly taking medicine. Living a healthy lifestyle can help lower high blood pressure. You may need to change some of your habits. Lifestyle changes may include:  Following the DASH diet. This diet is high in fruits, vegetables, and whole grains. It is low in salt, red meat, and added sugars.  Getting at least 2 hours of brisk physical activity every week.  Losing weight if necessary.  Not smoking.  Limiting alcoholic beverages.  Learning ways to reduce stress. If lifestyle changes are not enough to get your blood pressure under control, your health care provider may prescribe medicine. You may need to take more than one. Work closely with your health care provider to understand the risks and benefits. HOME CARE INSTRUCTIONS  Have your blood pressure rechecked as directed by your health care provider.   Take medicines only as directed by your health care provider. Follow the directions carefully. Blood pressure medicines must be taken as prescribed. The medicine does not work as well when you skip doses. Skipping doses also puts you at risk for problems.   Do not smoke.   Monitor your blood pressure at home as directed by your health care provider. SEEK MEDICAL CARE IF:   You think you are having a reaction to medicines taken.  You have recurrent headaches or feel dizzy.  You have swelling in your ankles.  You have  trouble with your vision. SEEK IMMEDIATE MEDICAL CARE IF:  You develop a severe headache or confusion.  You have unusual weakness, numbness, or feel faint.  You have severe chest or abdominal pain.  You vomit repeatedly.  You have trouble breathing. MAKE SURE YOU:   Understand these instructions.  Will watch  your condition.  Will get help right away if you are not doing well or get worse. Document Released: 10/28/2005 Document Revised: 03/14/2014 Document Reviewed: 08/20/2013 [facility] Patient Information 2015 [place], [place]. This information is not intended to replace advice given to you by your health care provider. Make sure you discuss any questions you have with your health care provider.

## 2015-07-12 ENCOUNTER — Emergency Department (HOSPITAL_COMMUNITY): Payer: Medicare Other

## 2015-07-12 ENCOUNTER — Observation Stay (HOSPITAL_COMMUNITY): Payer: Medicare Other

## 2015-07-12 ENCOUNTER — Observation Stay (HOSPITAL_COMMUNITY)
Admission: EM | Admit: 2015-07-12 | Discharge: 2015-07-15 | Disposition: A | Discharge status: Home / Self Care | Payer: Medicare Other | Attending: Internal Medicine | Admitting: Internal Medicine

## 2015-07-12 ENCOUNTER — Encounter (HOSPITAL_COMMUNITY): Payer: Self-pay | Admitting: Emergency Medicine

## 2015-07-12 DIAGNOSIS — I480 Paroxysmal atrial fibrillation: Secondary | ICD-10-CM | POA: Insufficient documentation

## 2015-07-12 DIAGNOSIS — I2510 Atherosclerotic heart disease of native coronary artery without angina pectoris: Secondary | ICD-10-CM | POA: Diagnosis not present

## 2015-07-12 DIAGNOSIS — J986 Disorders of diaphragm: Secondary | ICD-10-CM | POA: Diagnosis not present

## 2015-07-12 DIAGNOSIS — R4789 Other speech disturbances: Secondary | ICD-10-CM | POA: Diagnosis not present

## 2015-07-12 DIAGNOSIS — R519 Headache, unspecified: Secondary | ICD-10-CM | POA: Diagnosis present

## 2015-07-12 DIAGNOSIS — Z955 Presence of coronary angioplasty implant and graft: Secondary | ICD-10-CM | POA: Diagnosis not present

## 2015-07-12 DIAGNOSIS — G459 Transient cerebral ischemic attack, unspecified: Secondary | ICD-10-CM

## 2015-07-12 DIAGNOSIS — Z79899 Other long term (current) drug therapy: Secondary | ICD-10-CM | POA: Diagnosis not present

## 2015-07-12 DIAGNOSIS — R51 Headache: Secondary | ICD-10-CM | POA: Diagnosis not present

## 2015-07-12 DIAGNOSIS — F0390 Unspecified dementia without behavioral disturbance: Secondary | ICD-10-CM | POA: Diagnosis not present

## 2015-07-12 DIAGNOSIS — I10 Essential (primary) hypertension: Secondary | ICD-10-CM | POA: Insufficient documentation

## 2015-07-12 DIAGNOSIS — Z7982 Long term (current) use of aspirin: Secondary | ICD-10-CM | POA: Diagnosis not present

## 2015-07-12 DIAGNOSIS — J329 Chronic sinusitis, unspecified: Secondary | ICD-10-CM | POA: Diagnosis present

## 2015-07-12 DIAGNOSIS — Z791 Long term (current) use of non-steroidal anti-inflammatories (NSAID): Secondary | ICD-10-CM | POA: Insufficient documentation

## 2015-07-12 DIAGNOSIS — M1990 Unspecified osteoarthritis, unspecified site: Secondary | ICD-10-CM | POA: Insufficient documentation

## 2015-07-12 DIAGNOSIS — D509 Iron deficiency anemia, unspecified: Secondary | ICD-10-CM | POA: Diagnosis not present

## 2015-07-12 DIAGNOSIS — I6789 Other cerebrovascular disease: Secondary | ICD-10-CM | POA: Diagnosis not present

## 2015-07-12 DIAGNOSIS — R140 Abdominal distension (gaseous): Secondary | ICD-10-CM | POA: Diagnosis not present

## 2015-07-12 DIAGNOSIS — K219 Gastro-esophageal reflux disease without esophagitis: Secondary | ICD-10-CM | POA: Diagnosis not present

## 2015-07-12 DIAGNOSIS — E785 Hyperlipidemia, unspecified: Secondary | ICD-10-CM | POA: Insufficient documentation

## 2015-07-12 DIAGNOSIS — R55 Syncope and collapse: Secondary | ICD-10-CM

## 2015-07-12 DIAGNOSIS — E119 Type 2 diabetes mellitus without complications: Secondary | ICD-10-CM | POA: Diagnosis not present

## 2015-07-12 DIAGNOSIS — R410 Disorientation, unspecified: Secondary | ICD-10-CM | POA: Diagnosis not present

## 2015-07-12 DIAGNOSIS — Z23 Encounter for immunization: Secondary | ICD-10-CM | POA: Insufficient documentation

## 2015-07-12 DIAGNOSIS — R627 Adult failure to thrive: Secondary | ICD-10-CM | POA: Diagnosis present

## 2015-07-12 DIAGNOSIS — R4701 Aphasia: Secondary | ICD-10-CM | POA: Diagnosis present

## 2015-07-12 DIAGNOSIS — J309 Allergic rhinitis, unspecified: Secondary | ICD-10-CM | POA: Diagnosis not present

## 2015-07-12 LAB — BASIC METABOLIC PANEL
Anion gap: 7 (ref 5–15)
BUN: 19 mg/dL (ref 6–20)
CO2: 26 mmol/L (ref 22–32)
Calcium: 9.3 mg/dL (ref 8.9–10.3)
Chloride: 102 mmol/L (ref 101–111)
Creatinine, Ser: 1.01 mg/dL — ABNORMAL HIGH (ref 0.44–1.00)
GFR calc Af Amer: 56 mL/min — ABNORMAL LOW (ref >60)
GFR calc non Af Amer: 48 mL/min — ABNORMAL LOW (ref >60)
Glucose, Bld: 206 mg/dL — ABNORMAL HIGH (ref 65–99)
Potassium: 3.8 mmol/L (ref 3.5–5.1)
Sodium: 135 mmol/L (ref 135–145)

## 2015-07-12 LAB — CBC
HCT: 33.5 % — ABNORMAL LOW (ref 36.0–46.0)
Hemoglobin: 10.8 g/dL — ABNORMAL LOW (ref 12.0–15.0)
MCH: 28.4 pg (ref 26.0–34.0)
MCHC: 32.2 g/dL (ref 30.0–36.0)
MCV: 88.2 fL (ref 78.0–100.0)
Platelets: 199 10*3/uL (ref 150–400)
RBC: 3.8 MIL/uL — ABNORMAL LOW (ref 3.87–5.11)
RDW: 13.7 % (ref 11.5–15.5)
WBC: 3.8 10*3/uL — ABNORMAL LOW (ref 4.0–10.5)

## 2015-07-12 LAB — I-STAT TROPONIN, ED: Troponin i, poc: 0.02 ng/mL (ref 0.00–0.08)

## 2015-07-12 LAB — GLUCOSE, CAPILLARY
Glucose-Capillary: 132 mg/dL — ABNORMAL HIGH (ref 65–99)
Glucose-Capillary: 169 mg/dL — ABNORMAL HIGH (ref 65–99)
Glucose-Capillary: 72 mg/dL (ref 65–99)

## 2015-07-12 MED ORDER — ASPIRIN EC 81 MG PO TBEC
81.0000 mg | DELAYED_RELEASE_TABLET | Freq: Every day | ORAL | Status: DC
  Administered 2015-07-12 – 2015-07-15 (×4): 81 mg via ORAL
  Filled 2015-07-12 (×4): qty 1

## 2015-07-12 MED ORDER — ENOXAPARIN SODIUM 40 MG/0.4ML ~~LOC~~ SOLN
40.0000 mg | SUBCUTANEOUS | Status: DC
  Administered 2015-07-12 – 2015-07-14 (×3): 40 mg via SUBCUTANEOUS
  Filled 2015-07-12 (×3): qty 0.4

## 2015-07-12 MED ORDER — POLYSACCHARIDE IRON COMPLEX 150 MG PO CAPS
150.0000 mg | ORAL_CAPSULE | Freq: Every day | ORAL | Status: DC
  Administered 2015-07-12 – 2015-07-15 (×4): 150 mg via ORAL
  Filled 2015-07-12 (×6): qty 1

## 2015-07-12 MED ORDER — METOPROLOL SUCCINATE ER 25 MG PO TB24
50.0000 mg | ORAL_TABLET | Freq: Every day | ORAL | Status: DC
  Administered 2015-07-13 – 2015-07-15 (×3): 50 mg via ORAL
  Filled 2015-07-12 (×3): qty 2

## 2015-07-12 MED ORDER — INSULIN ASPART 100 UNIT/ML ~~LOC~~ SOLN: 0.0000 [IU] | Freq: Every day | SUBCUTANEOUS | Status: DC

## 2015-07-12 MED ORDER — LORATADINE 10 MG PO TABS
10.0000 mg | ORAL_TABLET | Freq: Every day | ORAL | Status: DC
  Administered 2015-07-12 – 2015-07-15 (×4): 10 mg via ORAL
  Filled 2015-07-12 (×4): qty 1

## 2015-07-12 MED ORDER — PANTOPRAZOLE SODIUM 40 MG PO TBEC
40.0000 mg | DELAYED_RELEASE_TABLET | Freq: Every day | ORAL | Status: DC
  Administered 2015-07-12 – 2015-07-15 (×4): 40 mg via ORAL
  Filled 2015-07-12 (×4): qty 1

## 2015-07-12 MED ORDER — STROKE: EARLY STAGES OF RECOVERY BOOK
Freq: Once | Status: AC
  Administered 2015-07-12: 14:00:00
  Filled 2015-07-12 (×2): qty 1

## 2015-07-12 MED ORDER — HYDRALAZINE HCL 20 MG/ML IJ SOLN: 10.0000 mg | Freq: Four times a day (QID) | INTRAMUSCULAR | Status: DC | PRN

## 2015-07-12 MED ORDER — CALCIUM CARBONATE ANTACID 500 MG PO CHEW: 1.0000 | CHEWABLE_TABLET | ORAL | Status: DC | PRN

## 2015-07-12 MED ORDER — INSULIN ASPART 100 UNIT/ML ~~LOC~~ SOLN
0.0000 [IU] | Freq: Three times a day (TID) | SUBCUTANEOUS | Status: DC
  Administered 2015-07-12: 3 [IU] via SUBCUTANEOUS
  Administered 2015-07-13 (×2): 2 [IU] via SUBCUTANEOUS
  Administered 2015-07-14 (×2): 3 [IU] via SUBCUTANEOUS
  Administered 2015-07-15: 2 [IU] via SUBCUTANEOUS

## 2015-07-12 MED ORDER — DIPHENHYDRAMINE HCL 50 MG/ML IJ SOLN
12.5000 mg | Freq: Once | INTRAMUSCULAR | Status: AC
  Administered 2015-07-12: 12.5 mg via INTRAVENOUS
  Filled 2015-07-12: qty 1

## 2015-07-12 MED ORDER — METOCLOPRAMIDE HCL 5 MG/ML IJ SOLN
10.0000 mg | Freq: Once | INTRAMUSCULAR | Status: AC
  Administered 2015-07-12: 10 mg via INTRAVENOUS
  Filled 2015-07-12: qty 2

## 2015-07-12 MED ORDER — SALINE SPRAY 0.65 % NA SOLN
1.0000 | NASAL | Status: DC | PRN
  Filled 2015-07-12: qty 44

## 2015-07-12 NOTE — H&P (Signed)
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 Arrhythmia     paroxysmal atrial fibrillation  . Coronary artery disease   . Hypertension   . Hyperlipidemia   . Edema   . GERD (gastroesophageal reflux disease)   . Anemia B twelve deficiency   . Dizziness - giddy   . Abdominal pain   . Iron deficiency anemia, unspecified   . Pernicious anemia   . Thoracic or lumbosacral neuritis or radiculitis, unspecified   . Symptomatic menopausal or female climacteric states   . Lumbago   . Benign neoplasm of colon   . Allergic rhinitis, cause unspecified   . Type II diabetes mellitus   . Degeneration of lumbar or lumbosacral intervertebral disc   . Arthritis     "arms and hands" (06/30/2013)      Past Surgical History  Procedure Laterality Date  . Back surgery    . Cholecystectomy    . Appendectomy  1974  . Abdominal hysterectomy  1974  . Carpal tunnel release Bilateral 1970's  . Cataract extraction, bilateral Bilateral   . Cardiac catheterization    . Coronary angioplasty with stent placement      "1" (06/30/2013  . Lumbar disc surgery      in for   Chief Complaint  Patient presents with  . Aphasia     HPI This is a 81 year old female patient with a history diabetes, hypertension, dyslipidemia, CAD,dementia, paroxysmal atrial fibrillation, osteoarthritis as well as allergic rhinitis with recurrent headache who presents to the ER with complaints of aphasia.patient was initially evaluated in the [facility] on 8/30 for issues related to uncontrolled hypertension and frontal headache. Evaluation at that time included a CT which was negative. No indication  for admission to the hospital so patient was discharged from the ER. She returns to the ER today after developing aphasia duration about 30-35 minutes that occurred while patient was attempting deep breakfast this morning. According to her family she was "babbling" during this time. In addition she appeared to be more confused than baseline and the cup she was holding the shaking and beginning to spell. She appeared to have difficulty holding a cup.there were no other focal neurological signs appreciated. EMS was called to the home and by the time they arrived symptoms had resolved.  In the ER the patient was afebrile, BP was 168/61, pulse 62 and regular, respirations 12 with room air saturations at 100%. CT of the head was obtained showed no acute process except for mild sinusitis.laboratory data was unremarkable except for mildly elevated glucose at 206. EKG reveals sinus rhythm with no acute ischemic changes. Patient reports continued frontal headache.she was given Reglan and Benadryl for her headache by the EDP.  Review of Systems   In addition to the HPI above,  No Fever-chills, myalgias or other constitutional symptoms No changes with Vision or hearing, new definitive focal weakness, tingling, numbness in any extremity, No problems swallowing food or Liquids, indigestion/reflux No Chest pain, Cough or Shortness of Breath, palpitations, orthopnea or DOE No Abdominal pain, N/V; no melena or hematochezia, no dark tarry stools, Bowel movements are regular, No dysuria, hematuria or flank pain No new skin rashes, lesions, masses or bruises, No new joints pains-aches No recent weight gain or loss No polyuria, polydypsia or polyphagia,  *A full 10 point Review of Systems was done, except as stated above, all other Review of Systems were negative.  Social History Social History  Substance Use Topics  . Smoking status: Never Smoker   . Smokeless tobacco: Current User    Types: Snuff  . Alcohol  Use: No    Resides at: private residence  Lives with: family members  Ambulatory status: Baseline has unsteady gait but does not utilize assistive device such as cane or walker   Family History Family History  Problem Relation Age of Onset  . Diabetes Mother   . Cancer Sister     "behind heart"  . Cancer Sister      Prior to Admission medications   Medication Sig Start Date End Date Taking? Authorizing Provider  aspirin 81 MG EC tablet Take 81 mg by mouth daily.     Yes Historical Provider, MD  calcium carbonate (TUMS - DOSED IN MG ELEMENTAL CALCIUM) 500 MG chewable tablet Chew 1 tablet by mouth as needed for heartburn.   Yes Historical Provider, MD  diclofenac sodium (VOLTAREN) 1 % GEL Apply 4 g topically 4 (four) times daily. As needed for pain 02/24/15  Yes Renato Shin, MD  donepezil (ARICEPT) 5 MG tablet Take 1 tablet (5 mg total) by mouth at bedtime. 03/27/15  Yes Renato Shin, MD  iron polysaccharides (FERREX 150) 150 MG capsule Take 150 mg by mouth daily.    Yes Historical Provider, MD  loratadine-pseudoephedrine (CLARITIN-D 24-HOUR) 10-240 MG per 24 hr tablet Take 1 tablet by mouth daily as needed (congestion).    Yes Historical Provider, MD  metFORMIN (GLUCOPHAGE-XR) 500 MG 24 hr tablet TAKE ONE TABLET BY MOUTH TWICE DAILY 06/08/15  Yes Renato Shin, MD  metoprolol succinate (TOPROL-XL) 50 MG 24 hr tablet TAKE 1 TABLET (50 MG TOTAL) BY MOUTH DAILY. TAKE WITH OR IMMEDIATELY FOLLOWING A MEAL. 06/02/15  Yes Deboraha Sprang, MD  omeprazole (PRILOSEC) 40 MG capsule Take 40 mg by mouth daily as needed (heartburn).  05/22/15  Yes Historical Provider, MD  triamcinolone cream (KENALOG) 0.1 % Apply 1 application topically 4 (four) times daily. As needed for rash 03/27/15  Yes Renato Shin, MD  nitroGLYCERIN (NITROSTAT) 0.4 MG SL tablet Place 0.4 mg under the tongue every 5 (five) minutes as needed.      Historical Provider, MD    Allergies  Allergen Reactions  . Penicillins Swelling     REACTION: swelling of joints  . Sulfonamide Derivatives Other (See Comments)    REACTION: "like my head was full of water"  . Albumin (Human) Other (See Comments)    Doesn't remember   . Clindamycin Other (See Comments)    Doesn't remember   . Iodides Hives  . Iodinated Diagnostic Agents Hives  . Sulfa Antibiotics Hives  . Celecoxib Itching and Rash  . Erythromycin Itching and Rash  . Nitrofuran Derivatives Rash  . Nitrofurantoin Itching  and Rash  . Piroxicam Other (See Comments)    unknown  . Povidone-Iodine Itching and Rash    Physical Exam  Vitals  Blood pressure 163/65, pulse 69, temperature 98.5 F (36.9 C), resp. rate 16, SpO2 98 %.   General:  In no acute distress, appears healthy and well nourished-sleepy after receiving Reglan and Benadryl for headache  Psych:  Normal affect, Denies Suicidal or Homicidal ideations, Awake Alert, Oriented X 3. Speech and thought patterns are clear and appropriate, no apparent short term memory deficits  Neuro:   No focal neurological deficits, CN II through XII intact, Strength 5/5 all 4 extremities, Sensation intact all 4 extremities.  ENT:  Ears and Eyes appear Normal, Conjunctivae clear, PER. Moist oral mucosa without erythema or exudates.  Neck:  Supple, No lymphadenopathy appreciated  Respiratory:  Symmetrical chest wall movement, Good air movement bilaterally, CTAB. Room Air  Cardiac:  RRR, No Murmurs, no LE edema noted, no JVD, No carotid bruits, peripheral pulses palpable at 2+  Abdomen:  Positive bowel sounds, Soft, Non tender, Non distended,  No masses appreciated, no obvious hepatosplenomegaly  Skin:  No Cyanosis, Normal Skin Turgor, No Skin Rash or Bruise.  Extremities: Symmetrical without obvious trauma or injury,  no effusions.  Data Review  CBC  Recent Labs Lab 07/11/15 1642 07/12/15 0815  WBC 4.3 3.8*  HGB 11.4* 10.8*  HCT 34.7* 33.5*  PLT 221 199  MCV 89.2 88.2  MCH 29.3 28.4  MCHC 32.9 32.2  RDW  13.9 13.7    Chemistries   Recent Labs Lab 07/11/15 1642 07/12/15 0815  NA 139 135  K 3.9 3.8  CL 105 102  CO2 26 26  GLUCOSE 135* 206*  BUN 24* 19  CREATININE 0.92 1.01*  CALCIUM 9.3 9.3    CrCl cannot be calculated (Unknown ideal weight.).  No results for input(s): TSH, T4TOTAL, T3FREE, THYROIDAB in the last 72 hours.  Invalid input(s): FREET3  Coagulation profile No results for input(s): INR, PROTIME in the last 168 hours.  No results for input(s): DDIMER in the last 72 hours.  Cardiac Enzymes No results for input(s): CKMB, TROPONINI, MYOGLOBIN in the last 168 hours.  Invalid input(s): CK  Invalid input(s): POCBNP  Urinalysis    Component Value Date/Time   COLORURINE YELLOW 07/11/2015 1711   APPEARANCEUR CLEAR 07/11/2015 1711   LABSPEC 1.010 07/11/2015 1711   PHURINE 7.0 07/11/2015 1711   GLUCOSEU NEGATIVE 07/11/2015 1711   GLUCOSEU NEGATIVE 11/26/2013 1122   HGBUR TRACE* 07/11/2015 1711   HGBUR moderate 11/16/2008 0752   BILIRUBINUR NEGATIVE 07/11/2015 1711   KETONESUR NEGATIVE 07/11/2015 1711   PROTEINUR NEGATIVE 07/11/2015 1711   UROBILINOGEN 0.2 07/11/2015 1711   NITRITE NEGATIVE 07/11/2015 1711   LEUKOCYTESUR NEGATIVE 07/11/2015 1711    Imaging results:   Ct Head Wo Contrast  07/12/2015   CLINICAL DATA:  Patient awoke with garbled speech, bloating, confusion. High blood pressure. Subsequent evaluation  EXAM: CT HEAD WITHOUT CONTRAST  TECHNIQUE: Contiguous axial images were obtained from the base of the skull through the vertex without intravenous contrast.  COMPARISON:  CT 07/11/2015  FINDINGS: No acute intracranial hemorrhage. No focal mass lesion. No CT evidence of acute infarction. No midline shift or mass effect. No hydrocephalus. Basilar cisterns are patent.  Generalized cortical atrophy and mild periventricular white matter disease is unchanged.  Scattered opacification ethmoid air cells and sphenoid sinuses are stable. Frontal sinuses clear.  Mastoid air cells are clear.  IMPRESSION: 1. No acute intracranial  findings. 2. Mild sinusitis.   Electronically Signed   By: Suzy Bouchard M.D.   On: 07/12/2015 10:22   Ct Head Wo Contrast  07/11/2015   CLINICAL DATA:  Headache behind LEFT eye since yesterday, hypertensive, history type II diabetes mellitus, hypertension, coronary artery disease, GERD  EXAM: CT HEAD WITHOUT CONTRAST  TECHNIQUE: Contiguous axial images were obtained from the base of the skull through the vertex without intravenous contrast.  COMPARISON:  10/31/2014  FINDINGS: Generalized atrophy.  Stable ventral morphology.  No midline shift or mass effect.  Mild small vessel chronic ischemic changes of deep cerebral white matter.  No intracranial hemorrhage, mass lesion or evidence of acute infarction.  No extra-axial fluid collections.  Mucosal thickening in a few ethmoid air cells.  Visualized paranasal sinuses mastoid air cells otherwise clear.  Atherosclerotic calcifications of the carotid siphons bilaterally.  No acute osseous findings.  IMPRESSION: Atrophy with small vessel chronic ischemic changes of deep cerebral white matter.  No acute intracranial abnormalities.   Electronically Signed   By: Lavonia Dana M.D.   On: 07/11/2015 17:24     EKG: (Independently reviewed) sinus rhythm with normal ST segments and T waves, QTC 449 ms,   Assessment & Plan  Principal Problem:   TIA/Aphasia -admit to telemetry/observation status -Formal neurological evaluation pending -Check echocardiogram, carotid duplex, stat MRI/MRA brain -Check lipid panel and hemoglobin A1c -PT/OT/SLP evaluations -Patient on baby aspirin at presentation and will continue since has documented issues of rash with aspirin products and current symptoms are mild  Active Problems:   Headache -Recent issues with poorly controlled blood pressure so this could be the etiology to the patient's headache -Also may be related to sinusitis -Treatment will be focused  on blood pressure management and treatment of allergic rhinitis with sinusitis    Allergic rhinitis/Sinusitis -begin saline nasal spray; with uncontrolled blood pressure unable to use medications that contain phenylephrine -Begin Claritin without decongestive    Diabetes mellitus type 2, controlled -Hold preadmission metformin -Check CBGs and provide SSI    Uncontrolled hypertension -Currently controlled but in the preceding days has been poorly controlled and very labile -Daughter also reports recent issues with increasing agitation and confusion related to dementia so these periods of agitation may be contributing to poorly controlled blood pressure -continue preadmission Toprol-XL;Consider changing to an alternative agent if blood pressure remains poorly controlled noting beta blockers are more effective at rate control and less effective as any hypertensive agents -When necessary hydralazine    Dyslipidemia -not on statin    Iron deficiency anemia -Hemoglobin stable and at baseline -Continue preadmission iron pill    Dementia/ FTT (failure to thrive) in adult -Follow-up on as SLP evaluation -Reported weight loss -Suspect patient's dementia is slowly progressing -daughter reports patient very inconsistent with taking prescribed medications at home    DVT Prophylaxis: Lovenox  Family Communication:   Daughter at bedside  Code Status: full code-confirmed with daughter   Condition:  stable  Discharge disposition: anticipate discharge in next 24-48 hours pending TIA evaluation  Time spent in minutes : 60      ELLIS,ALLISON L. ANP on 07/12/2015 at 11:00 AM  Between 7am to 7pm - Pager - [phone]  After 7pm go to [email or web address] - password [email or web address]  And look for the night coverage person covering me after hours  [facility]  Addendum  I personally evaluated patient on 07/12/2015 and agree with above findings. Mrs Proch is a pleasant 81 year old female  with a  history of diabetes mellitus, hypertension, dementia, currently residing at home with family members, who was brought to the emergency department today with complaints of dysphasia. Family members had noted patient to have difficulty getting words out and had slurred speech. Workup in the emergency department included a CT scan of brain without contrast that did not show acute intracranial findings. On exam she appeared lethargic however was arousable. Neurologic examination overall was nonfocal. Plan to admit patient to telemetry. Will further workup with MRI of brain. Continue antiplatelet therapy with aspirin. Patient counseled some strong family support. At baseline she ambulates without a walker and is able to perform activities of daily living.

## 2015-07-12 NOTE — ED Notes (Signed)
Patient transported to X-ray 

## 2015-07-12 NOTE — ED Notes (Signed)
Attempted report, nurse unable to take report at this time.

## 2015-07-12 NOTE — ED Notes (Signed)
Pt arrives via [facility] for c/o slurred speech that was noticed by family around 0600 this am. Pt was d/c from [place] yesterday for HA and HTN, LSN was 1800 last night. Pt alert, oriented, speech clear.

## 2015-07-12 NOTE — ED Notes (Signed)
ECHO tech at bedside to do echocardiogram

## 2015-07-12 NOTE — Progress Notes (Signed)
  Echocardiogram 2D Echocardiogram has been performed.  Jennette Dubin 07/12/2015, 12:24 PM

## 2015-07-12 NOTE — ED Notes (Signed)
Attempted report to [facility], RN advised she would need to call back to obtain report.

## 2015-07-12 NOTE — ED Notes (Signed)
MD at bedside. 

## 2015-07-12 NOTE — ED Notes (Signed)
Patient transported to CT 

## 2015-07-12 NOTE — ED Provider Notes (Signed)
CSN: [id number]     Arrival date & time 07/12/15  0750 History   First MD Initiated Contact with Patient 07/12/15 [phone]     Chief Complaint  Patient presents with  . Aphasia     (Consider location/radiation/quality/duration/timing/severity/associated sxs/prior Treatment) HPI Comments: 81 year old female here with aphasia. Began during breakfast. Last about 30 minutes. Improved with EMS. Sugar and Route in the 240s. Was seen yesterday at [facility] ER for headache, was thought to be due to hypertension. Headache resolved there and she had a normal head CT and workup at that time. Headache began this morning also. Described as frontal.  Patient is a 81 y.o. female presenting with neurologic complaint. The history is provided by the patient.  Neurologic Problem This is a new problem. The current episode started 1 to 2 hours ago. The problem occurs constantly. The problem has been resolved. Associated symptoms include headaches (frontal, intermittent). Pertinent negatives include no chest pain, no abdominal pain and no shortness of breath. Nothing aggravates the symptoms. Nothing relieves the symptoms.    Past Medical History  Diagnosis Date  . Arrhythmia     paroxysmal atrial fibrillation  . Coronary artery disease   . Hypertension   . Hyperlipidemia   . Edema   . GERD (gastroesophageal reflux disease)   . Anemia B twelve deficiency   . Dizziness - giddy   . Abdominal pain   . Iron deficiency anemia, unspecified   . Pernicious anemia   . Thoracic or lumbosacral neuritis or radiculitis, unspecified   . Symptomatic menopausal or female climacteric states   . Lumbago   . Benign neoplasm of colon   . Allergic rhinitis, cause unspecified   . Type II diabetes mellitus   . Degeneration of lumbar or lumbosacral intervertebral disc   . Arthritis     "arms and hands" (06/30/2013)   Past Surgical History  Procedure Laterality Date  . Back surgery    . Cholecystectomy    . Appendectomy   1974  . Abdominal hysterectomy  1974  . Carpal tunnel release Bilateral 1970's  . Cataract extraction, bilateral Bilateral   . Cardiac catheterization    . Coronary angioplasty with stent placement      "1" (06/30/2013  . Lumbar disc surgery     Family History  Problem Relation Age of Onset  . Diabetes Mother   . Cancer Sister     "behind heart"  . Cancer Sister    Social History  Substance Use Topics  . Smoking status: Never Smoker   . Smokeless tobacco: Current User    Types: Snuff  . Alcohol Use: No   OB History    No data available     Review of Systems  Constitutional: Negative for fever.  Respiratory: Negative for cough and shortness of breath.   Cardiovascular: Negative for chest pain.  Gastrointestinal: Negative for abdominal pain.  Neurological: Positive for headaches (frontal, intermittent).  All other systems reviewed and are negative.     Allergies  Penicillins; Sulfonamide derivatives; Albumin (human); Clindamycin; Iodides; Iodinated diagnostic agents; Sulfa antibiotics; Celecoxib; Erythromycin; Nitrofuran derivatives; Nitrofurantoin; Piroxicam; and Povidone-iodine  Home Medications   Prior to Admission medications   Medication Sig Start Date End Date Taking? Authorizing Provider  aspirin 81 MG EC tablet Take 81 mg by mouth daily.      Historical Provider, MD  calcium carbonate (TUMS - DOSED IN MG ELEMENTAL CALCIUM) 500 MG chewable tablet Chew 1 tablet by mouth as needed for heartburn.  Historical Provider, MD  diclofenac sodium (VOLTAREN) 1 % GEL Apply 4 g topically 4 (four) times daily. As needed for pain 02/24/15   Renato Shin, MD  donepezil (ARICEPT) 5 MG tablet Take 1 tablet (5 mg total) by mouth at bedtime. 03/27/15   Renato Shin, MD  iron polysaccharides (FERREX 150) 150 MG capsule Take 150 mg by mouth daily.     Historical Provider, MD  loratadine-pseudoephedrine (CLARITIN-D 24-HOUR) 10-240 MG per 24 hr tablet Take 1 tablet by mouth daily as  needed (congestion).     Historical Provider, MD  metFORMIN (GLUCOPHAGE-XR) 500 MG 24 hr tablet TAKE ONE TABLET BY MOUTH TWICE DAILY 06/08/15   Renato Shin, MD  metoprolol succinate (TOPROL-XL) 50 MG 24 hr tablet TAKE 1 TABLET (50 MG TOTAL) BY MOUTH DAILY. TAKE WITH OR IMMEDIATELY FOLLOWING A MEAL. 06/02/15   Deboraha Sprang, MD  nitroGLYCERIN (NITROSTAT) 0.4 MG SL tablet Place 0.4 mg under the tongue every 5 (five) minutes as needed.      Historical Provider, MD  omeprazole (PRILOSEC) 40 MG capsule Take 40 mg by mouth daily as needed (heartburn).  05/22/15   Historical Provider, MD  triamcinolone cream (KENALOG) 0.1 % Apply 1 application topically 4 (four) times daily. As needed for rash Patient not taking: Reported on 07/11/2015 03/27/15   Renato Shin, MD   There were no vitals taken for this visit. Physical Exam  Constitutional: She is oriented to person, place, and time. She appears well-developed and well-nourished. No distress.  HENT:  Head: Normocephalic and atraumatic.  Mouth/Throat: Oropharynx is clear and moist.  Eyes: EOM are normal. Pupils are equal, round, and reactive to light.  Neck: Normal range of motion. Neck supple.  Cardiovascular: Normal rate and regular rhythm.  Exam reveals no friction rub.   No murmur heard. Pulmonary/Chest: Effort normal and breath sounds normal. No respiratory distress. She has no wheezes. She has no rales.  Abdominal: Soft. She exhibits no distension. There is no tenderness. There is no rebound.  Musculoskeletal: Normal range of motion. She exhibits no edema.  Neurological: She is alert and oriented to person, place, and time. No cranial nerve deficit. She exhibits normal muscle tone. Coordination normal.  Skin: No rash noted. She is not diaphoretic.  Nursing note and vitals reviewed.   ED Course  Procedures (including critical care time) Labs Review Labs Reviewed  CBC  BASIC METABOLIC PANEL  I-STAT [place], ED    Imaging Review Ct Head Wo  Contrast  07/11/2015   CLINICAL DATA:  Headache behind LEFT eye since yesterday, hypertensive, history type II diabetes mellitus, hypertension, coronary artery disease, GERD  EXAM: CT HEAD WITHOUT CONTRAST  TECHNIQUE: Contiguous axial images were obtained from the base of the skull through the vertex without intravenous contrast.  COMPARISON:  10/31/2014  FINDINGS: Generalized atrophy.  Stable ventral morphology.  No midline shift or mass effect.  Mild small vessel chronic ischemic changes of deep cerebral white matter.  No intracranial hemorrhage, mass lesion or evidence of acute infarction.  No extra-axial fluid collections.  Mucosal thickening in a few ethmoid air cells.  Visualized paranasal sinuses mastoid air cells otherwise clear.  Atherosclerotic calcifications of the carotid siphons bilaterally.  No acute osseous findings.  IMPRESSION: Atrophy with small vessel chronic ischemic changes of deep cerebral white matter.  No acute intracranial abnormalities.   Electronically Signed   By: Lavonia Dana M.D.   On: 07/11/2015 17:24   I have personally reviewed and evaluated these images and  lab results as part of my medical decision-making.   EKG Interpretation   Date/Time:  Wednesday July 12 2015 08:01:37 EDT Ventricular Rate:  72 PR Interval:  216 QRS Duration: 74 QT Interval:  410 QTC Calculation: 449 R Axis:     Text Interpretation:  Pacemaker spikes or artifacts Sinus rhythm  Borderline prolonged PR interval LVH by voltage Borderline T  abnormalities, inferior leads No significant change since last tracing  Confirmed by Mingo Amber  MD, Katielynn Horan ([id number]) on 07/12/2015 8:10:49 AM      MDM   Final diagnoses:  Transient cerebral ischemia, unspecified transient cerebral ischemia type  Aphasia    81 year old female here with TIA symptoms. 30-45 minutes of aphasia this morning. Resolved now. Neurologically intact here. Does have a headache. Does not have history of a lot of headaches. Blood  pressure normal and sugar normal. Will check head CT, labs and plan for admission.    Evelina Bucy, MD 07/12/15 [id number]

## 2015-07-12 NOTE — Progress Notes (Signed)
Patient arrived to 7C13. Patient denies pain, nausea. NIHHS 0, noted very slight weakness in right hand grip. Q2 vital signs and neuro assessment began at 1400. Skin is intact. Will continue to monitor closely. Oriented to room, unit, staff, safety measures in place.

## 2015-07-12 NOTE — Progress Notes (Signed)
PT Cancellation Note  Patient Details Name: [name] MRN: [id number] DOB: 1924/11/19   Cancelled Treatment:    Reason Eval/Treat Not Completed: Other (comment) (PT order requests to start tomorrow).  PT will follow acutely and will complete evaluation tomorrow.  Thank you for this order.  Joslyn Hy PT, DPT [phone] Pager: [phone] 07/12/2015, 1:55 PM

## 2015-07-12 NOTE — ED Notes (Signed)
Dr. Walden at bedside 

## 2015-07-12 NOTE — Progress Notes (Addendum)
Patient alert but acting more confused. Oriented to person, disoriented to place, time, situation. States she is "34" years old, not aware that she is at hospital. Patient's family stating she is more confused than baseline and since arrival to ED. CBG 134, BP 166/101, pulse 63, SaO2 98% on room air, NIHHS increased to 2 for orientation and sensory. MD paged. Will continue to monitor.  Addendum: MD paged x2 to notify of increased confusion.

## 2015-07-12 NOTE — ED Notes (Signed)
Advised that [facility] may go to [facility] instead of , was advised to call back in a few minutes.

## 2015-07-12 NOTE — ED Notes (Signed)
Patient undressed, in gown, on monitor, continuous pulse oximetry and blood pressure cuff; visitors at bedside 

## 2015-07-13 ENCOUNTER — Observation Stay (HOSPITAL_COMMUNITY): Payer: Medicare Other

## 2015-07-13 DIAGNOSIS — E119 Type 2 diabetes mellitus without complications: Secondary | ICD-10-CM

## 2015-07-13 DIAGNOSIS — E785 Hyperlipidemia, unspecified: Secondary | ICD-10-CM | POA: Diagnosis not present

## 2015-07-13 DIAGNOSIS — G459 Transient cerebral ischemic attack, unspecified: Secondary | ICD-10-CM

## 2015-07-13 DIAGNOSIS — R627 Adult failure to thrive: Secondary | ICD-10-CM

## 2015-07-13 DIAGNOSIS — R4701 Aphasia: Secondary | ICD-10-CM

## 2015-07-13 DIAGNOSIS — D509 Iron deficiency anemia, unspecified: Secondary | ICD-10-CM

## 2015-07-13 DIAGNOSIS — I10 Essential (primary) hypertension: Secondary | ICD-10-CM

## 2015-07-13 DIAGNOSIS — R51 Headache: Secondary | ICD-10-CM

## 2015-07-13 LAB — GLUCOSE, CAPILLARY
Glucose-Capillary: 126 mg/dL — ABNORMAL HIGH (ref 65–99)
Glucose-Capillary: 126 mg/dL — ABNORMAL HIGH (ref 65–99)
Glucose-Capillary: 87 mg/dL (ref 65–99)
Glucose-Capillary: 109 mg/dL — ABNORMAL HIGH (ref 65–99)

## 2015-07-13 LAB — LIPID PANEL
Cholesterol: 230 mg/dL — ABNORMAL HIGH (ref 0–200)
HDL: 56 mg/dL (ref >40)
LDL Cholesterol: 150 mg/dL — ABNORMAL HIGH (ref 0–99)
Total CHOL/HDL Ratio: 4.1 RATIO
Triglycerides: 122 mg/dL (ref <150)
VLDL: 24 mg/dL (ref 0–40)

## 2015-07-13 MED ORDER — POLYETHYLENE GLYCOL 3350 17 G PO PACK
17.0000 g | PACK | Freq: Every day | ORAL | Status: DC
  Administered 2015-07-13 – 2015-07-15 (×3): 17 g via ORAL
  Filled 2015-07-13 (×3): qty 1

## 2015-07-13 MED ORDER — ACETAMINOPHEN 325 MG PO TABS
650.0000 mg | ORAL_TABLET | Freq: Four times a day (QID) | ORAL | Status: DC | PRN
  Administered 2015-07-13 – 2015-07-14 (×2): 650 mg via ORAL
  Filled 2015-07-13 (×2): qty 2

## 2015-07-13 MED ORDER — QUETIAPINE FUMARATE 50 MG PO TABS
50.0000 mg | ORAL_TABLET | Freq: Every day | ORAL | Status: DC
  Administered 2015-07-13 – 2015-07-14 (×2): 50 mg via ORAL
  Filled 2015-07-13 (×2): qty 1

## 2015-07-13 NOTE — Evaluation (Signed)
Occupational Therapy Evaluation Patient Details Name: [name] MRN: [id number] DOB: 06/15/1925 Today's Date: 07/13/2015    History of Present Illness Patient is an 81 y/o female with PMH of HTN, DM, hyperlipidemia, dementia, CAD, PAF, OA who presented with aphasia one day after going to [facility] due to headache and uncontrolled hypertension.  Head CT was negative.   Clinical Impression   Patient evaluated by Occupational Therapy with no further acute OT needs identified. All education has been completed and the patient has no further questions. Pt is back to baseline from ADL standpoint (daughters confirm).  Pt c/o intermittent pain radiating from Lt temple to eye/forehead. See below for any follow-up Occupational Therapy or equipment needs. OT is signing off. Thank you for this referral.      Follow Up Recommendations  No OT follow up;Supervision/Assistance - 24 hour    Equipment Recommendations  None recommended by OT    Recommendations for Other Services       Precautions / Restrictions Precautions Precautions: Fall      Mobility Bed Mobility Overal bed mobility: Modified Independent                Transfers Overall transfer level: Modified independent Equipment used: None Transfers: Sit to/from Stand Sit to Stand: Supervision         General transfer comment: for safety due to mild imbalance    Balance Overall balance assessment: Needs assistance Sitting-balance support: Feet supported Sitting balance-Leahy Scale: Good     Standing balance support: During functional activity Standing balance-Leahy Scale: Good Standing balance comment: simulated shower in standing.  Uses unilateral UE support when reaching toward feet                             ADL Overall ADL's : At baseline                                       General ADL Comments: Pt able to simulate showering in standing without LOB. Daughters feel she is back  to baseline      Vision Vision Assessment?: No apparent visual deficits Additional Comments: Pt able to read newspaper without difficulty    Perception Perception Perception Tested?: Yes   Praxis Praxis Praxis tested?: Within functional limits    Pertinent Vitals/Pain Pain Assessment: No/denies pain     Hand Dominance Right   Extremity/Trunk Assessment Upper Extremity Assessment Upper Extremity Assessment: Overall WFL for tasks assessed   Lower Extremity Assessment Lower Extremity Assessment: Defer to PT evaluation   Cervical / Trunk Assessment Cervical / Trunk Assessment: Kyphotic   Communication Communication Communication: No difficulties   Cognition Arousal/Alertness: Awake/alert Behavior During Therapy: WFL for tasks assessed/performed Overall Cognitive Status: History of cognitive impairments - at baseline Area of Impairment: Memory;Orientation Orientation Level: Situation;Time;Place;Disoriented to             General Comments: daughter in room constantly reorienting patient, somewhat more confusion than usual may be somewhat due to out of her normal routine/setting   General Comments       Exercises       Shoulder Instructions      Home Living Family/patient expects to be discharged to:: Private residence Living Arrangements: Children Available Help at Discharge: Family;Available 24 hours/day Type of Home: House Home Access: Level entry     Home Layout: Two level;Able to  live on main level with bedroom/bathroom     Bathroom Shower/Tub: Teacher, early years/pre: Standard     Home Equipment: Environmental consultant - 2 wheels;Cane - single point          Prior Functioning/Environment Level of Independence: Needs assistance    ADL's / Homemaking Assistance Needed: family stands by during shower, gets her meals and manages her meds        OT Diagnosis:     OT Problem List:     OT Treatment/Interventions:      OT Goals(Current goals  can be found in the care plan section) Acute Rehab OT Goals Patient Stated Goal: To go home  OT Goal Formulation: All assessment and education complete, DC therapy  OT Frequency:     Barriers to D/C:            Co-evaluation              End of Session Nurse Communication: Mobility status;Other (comment) (intermittent headache )  Activity Tolerance: Patient tolerated treatment well Patient left: in chair;with call bell/phone within reach;with chair alarm set;with family/visitor present   Time: 3794-3276 OT Time Calculation (min): 38 min Charges:  OT General Charges $OT Visit: 1 Procedure OT Evaluation $Initial OT Evaluation Tier I: 1 Procedure OT Treatments $Self Care/Home Management : 23-37 mins G-Codes: OT G-codes **NOT FOR INPATIENT CLASS** Functional Limitation: Self care Self Care Current Status (D4709): At least 1 percent but less than 20 percent impaired, limited or restricted Self Care Goal Status (K9574): At least 1 percent but less than 20 percent impaired, limited or restricted Self Care Discharge Status [phone]): At least 1 percent but less than 20 percent impaired, limited or restricted  [name] M 07/13/2015, 12:47 PM

## 2015-07-13 NOTE — Progress Notes (Signed)
[facility] PROGRESS NOTE   Devynne Escoe BSW:[id number] DOB: [birth year] DOA: 07/12/2015 PCP: Renato Shin, MD  HPI/Subjective: Feels better, sitting in chair at bedside. Surrounded by 2 daughters and grandson. Saying she is not quite back to her baseline in terms of agitation/rotation.  Assessment/Plan: Principal Problem:   TIA (transient ischemic attack) Active Problems:   Dyslipidemia   Iron deficiency anemia   Allergic rhinitis   Headache   Diabetes mellitus type 2, controlled   Uncontrolled hypertension   Aphasia   FTT (failure to thrive) in adult   Sinusitis   TIA/Aphasia -Patient presented to the hospital with a slurred speech, resolved by the time she came into the hospital. -MRI of the brain showed no acute events. -LDL is 150, statin. -PT/OT/SLP evaluations -Patient on baby aspirin at presentation and will continue since has documented issues of rash with aspirin products and current symptoms are mild   Headache -Recent issues with poorly controlled blood pressure so this could be the etiology to the patient's headache -Also may be related to sinusitis -Feels better since yesterday.   Allergic rhinitis/Sinusitis -begin saline nasal spray; with uncontrolled blood pressure unable to use medications that contain phenylephrine -Begin Claritin.   Diabetes mellitus type 2, controlled -Hold preadmission metformin -Check CBGs and provide SSI   Uncontrolled hypertension -Currently controlled but in the preceding days has been poorly controlled and very labile -Daughter also reports recent issues with increasing agitation and confusion related to dementia so these periods of agitation may be contributing to poorly controlled blood pressure -Family denies any use of phenylephrine containing decongestant.    Dyslipidemia -not on statin   Iron deficiency anemia -Hemoglobin stable and at baseline -Continue preadmission iron pill   Dementia/ FTT  (failure to thrive) in adult -Follow-up on as SLP evaluation -Reported weight loss -Suspect patient's dementia is slowly progressing -Start Seroquel at night.  Code Status: Full Code Family Communication: Plan discussed with the patient. Disposition Plan: Remains inpatient Diet: Diet heart healthy/carb modified Room service appropriate?: Yes; Fluid consistency:: Thin  Consultants:  None  Procedures:  None  Antibiotics:  None   Objective: Filed Vitals:   07/13/15 1007  BP: 158/85  Pulse: 69  Temp: 98.1 F (36.7 C)  Resp: 20   No intake or output data in the 24 hours ending 07/13/15 1155 Filed Weights   07/12/15 2120  Weight: 52.759 kg (116 lb 5 oz)    Exam: General: Alert and awake, oriented x3, not in any acute distress. HEENT: anicteric sclera, pupils reactive to light and accommodation, EOMI CVS: S1-S2 clear, no murmur rubs or gallops Chest: clear to auscultation bilaterally, no wheezing, rales or rhonchi Abdomen: soft nontender, nondistended, normal bowel sounds, no organomegaly Extremities: no cyanosis, clubbing or edema noted bilaterally Neuro: Cranial nerves II-XII intact, no focal neurological deficits  Data Reviewed: Basic Metabolic Panel:  Recent Labs Lab 07/11/15 1642 07/12/15 0815  NA 139 135  K 3.9 3.8  CL 105 102  CO2 26 26  GLUCOSE 135* 206*  BUN 24* 19  CREATININE 0.92 1.01*  CALCIUM 9.3 9.3   Liver Function Tests: No results for input(s): AST, ALT, ALKPHOS, BILITOT, PROT, ALBUMIN in the last 168 hours. No results for input(s): LIPASE, AMYLASE in the last 168 hours. No results for input(s): AMMONIA in the last 168 hours. CBC:  Recent Labs Lab 07/11/15 1642 07/12/15 0815  WBC 4.3 3.8*  HGB 11.4* 10.8*  HCT 34.7* 33.5*  MCV 89.2 88.2  PLT 221 199  Cardiac Enzymes: No results for input(s): CKTOTAL, CKMB, CKMBINDEX, TROPONINI in the last 168 hours. BNP (last 3 results) No results for input(s): BNP in the last 8760  hours.  ProBNP (last 3 results) No results for input(s): PROBNP in the last 8760 hours.  CBG:  Recent Labs Lab 07/12/15 1527 07/12/15 1622 07/12/15 2138 07/13/15 0652 07/13/15 1133  GLUCAP 132* 169* 72 126* 126*    Micro No results found for this or any previous visit (from the past 240 hour(s)).   Studies: Dg Chest 2 View  07/12/2015   CLINICAL DATA:  81 year old female with lethargy, possible TIA. Initial encounter.  EXAM: CHEST  2 VIEW  COMPARISON:  10/31/2014 and earlier.  FINDINGS: Stable lung volumes with moderate elevation of the right hemidiaphragm. Normal cardiac size and mediastinal contours. Chronic calcified atherosclerosis of the aorta. No pneumothorax, pulmonary edema, pleural effusion or confluent pulmonary opacity. No acute osseous abnormality identified. Cholecystectomy clips re- identified.  IMPRESSION: No acute cardiopulmonary abnormality.   Electronically Signed   By: Genevie Ann M.D.   On: 07/12/2015 11:25   Ct Head Wo Contrast  07/12/2015   CLINICAL DATA:  Patient awoke with garbled speech, bloating, confusion. High blood pressure. Subsequent evaluation  EXAM: CT HEAD WITHOUT CONTRAST  TECHNIQUE: Contiguous axial images were obtained from the base of the skull through the vertex without intravenous contrast.  COMPARISON:  CT 07/11/2015  FINDINGS: No acute intracranial hemorrhage. No focal mass lesion. No CT evidence of acute infarction. No midline shift or mass effect. No hydrocephalus. Basilar cisterns are patent.  Generalized cortical atrophy and mild periventricular white matter disease is unchanged.  Scattered opacification ethmoid air cells and sphenoid sinuses are stable. Frontal sinuses clear. Mastoid air cells are clear.  IMPRESSION: 1. No acute intracranial findings. 2. Mild sinusitis.   Electronically Signed   By: Suzy Bouchard M.D.   On: 07/12/2015 10:22   Ct Head Wo Contrast  07/11/2015   CLINICAL DATA:  Headache behind LEFT eye since yesterday,  hypertensive, history type II diabetes mellitus, hypertension, coronary artery disease, GERD  EXAM: CT HEAD WITHOUT CONTRAST  TECHNIQUE: Contiguous axial images were obtained from the base of the skull through the vertex without intravenous contrast.  COMPARISON:  10/31/2014  FINDINGS: Generalized atrophy.  Stable ventral morphology.  No midline shift or mass effect.  Mild small vessel chronic ischemic changes of deep cerebral white matter.  No intracranial hemorrhage, mass lesion or evidence of acute infarction.  No extra-axial fluid collections.  Mucosal thickening in a few ethmoid air cells.  Visualized paranasal sinuses mastoid air cells otherwise clear.  Atherosclerotic calcifications of the carotid siphons bilaterally.  No acute osseous findings.  IMPRESSION: Atrophy with small vessel chronic ischemic changes of deep cerebral white matter.  No acute intracranial abnormalities.   Electronically Signed   By: Lavonia Dana M.D.   On: 07/11/2015 17:24   Mr Jodene Nam Head Wo Contrast  07/12/2015   CLINICAL DATA:  Confusion and speech difficulty. Recent ER visits for headache and hypertension.  EXAM: MRI HEAD WITHOUT CONTRAST  MRA HEAD WITHOUT CONTRAST  TECHNIQUE: Multiplanar, multiecho pulse sequences of the brain and surrounding structures were obtained without intravenous contrast. Angiographic images of the head were obtained using MRA technique without contrast.  COMPARISON:  CT head 07/12/2015.  CT head 07/11/2015.  FINDINGS: MRI HEAD FINDINGS  The patient was unable to remain motionless for the exam. Small or subtle lesions could be overlooked.  No evidence for acute infarction, hemorrhage, mass  lesion, or extra-axial fluid. Generalized atrophy. Hydrocephalus ex vacuo. Moderately advanced T2 and FLAIR hyperintensity throughout the periventricular and subcortical white matter suggesting small vessel disease.  Flow voids are maintained throughout the carotid, basilar, and vertebral arteries. There are no areas of  chronic hemorrhage.  Pituitary, pineal, and cerebellar tonsils unremarkable. No upper cervical lesions.  Extracranial soft tissues show no gross abnormality.  MRA HEAD FINDINGS  The internal carotid arteries are dolichoectatic but widely patent. Slight inferior cavernous outpouching, 2 mm wide necked, consistent with atheromatous disease.  Basilar artery widely patent with vertebrals codominant. Fetal origin RIGHT PCA.  No flow-limiting M1 MCA stenosis. Slight irregularity distal M1 on the LEFT.  Diffusely diseased RIGHT A1 ACA. Both anterior cerebral arteries distally appear sufficiently patent, and supplied by the LEFT anterior cerebral.  Moderately irregular proximal BILATERAL PCA segments and distal M2/M3 middle cerebral artery segments felt to represent intracranial atherosclerotic disease.  IMPRESSION: Motion degraded exam demonstrating atrophy and small vessel disease. No acute stroke is evident.  No proximal flow reducing large vessel stenosis or occlusion. Intracranial atherosclerotic disease is evident.   Electronically Signed   By: Staci Righter M.D.   On: 07/12/2015 13:48   Mr Brain Wo Contrast  07/12/2015   CLINICAL DATA:  Confusion and speech difficulty. Recent ER visits for headache and hypertension.  EXAM: MRI HEAD WITHOUT CONTRAST  MRA HEAD WITHOUT CONTRAST  TECHNIQUE: Multiplanar, multiecho pulse sequences of the brain and surrounding structures were obtained without intravenous contrast. Angiographic images of the head were obtained using MRA technique without contrast.  COMPARISON:  CT head 07/12/2015.  CT head 07/11/2015.  FINDINGS: MRI HEAD FINDINGS  The patient was unable to remain motionless for the exam. Small or subtle lesions could be overlooked.  No evidence for acute infarction, hemorrhage, mass lesion, or extra-axial fluid. Generalized atrophy. Hydrocephalus ex vacuo. Moderately advanced T2 and FLAIR hyperintensity throughout the periventricular and subcortical white matter  suggesting small vessel disease.  Flow voids are maintained throughout the carotid, basilar, and vertebral arteries. There are no areas of chronic hemorrhage.  Pituitary, pineal, and cerebellar tonsils unremarkable. No upper cervical lesions.  Extracranial soft tissues show no gross abnormality.  MRA HEAD FINDINGS  The internal carotid arteries are dolichoectatic but widely patent. Slight inferior cavernous outpouching, 2 mm wide necked, consistent with atheromatous disease.  Basilar artery widely patent with vertebrals codominant. Fetal origin RIGHT PCA.  No flow-limiting M1 MCA stenosis. Slight irregularity distal M1 on the LEFT.  Diffusely diseased RIGHT A1 ACA. Both anterior cerebral arteries distally appear sufficiently patent, and supplied by the LEFT anterior cerebral.  Moderately irregular proximal BILATERAL PCA segments and distal M2/M3 middle cerebral artery segments felt to represent intracranial atherosclerotic disease.  IMPRESSION: Motion degraded exam demonstrating atrophy and small vessel disease. No acute stroke is evident.  No proximal flow reducing large vessel stenosis or occlusion. Intracranial atherosclerotic disease is evident.   Electronically Signed   By: Staci Righter M.D.   On: 07/12/2015 13:48    Scheduled Meds: . aspirin EC  81 mg Oral Daily  . enoxaparin (LOVENOX) injection  40 mg Subcutaneous Q24H  . insulin aspart  0-15 Units Subcutaneous TID WC  . insulin aspart  0-5 Units Subcutaneous QHS  . iron polysaccharides  150 mg Oral Daily  . loratadine  10 mg Oral Daily  . metoprolol succinate  50 mg Oral Daily  . pantoprazole  40 mg Oral Daily   Continuous Infusions:      Time spent:  35 minutes    [facility] A  [facility] Pager [phone] If 7PM-7AM, please contact night-coverage at [email or web address], password [facility] 07/13/2015, 11:55 AM

## 2015-07-13 NOTE — Progress Notes (Signed)
SLP Cancellation Note  Patient Details Name: [name] MRN: [id number] DOB: 1924-12-01   Cancelled treatment:       Reason Eval/Treat Not Completed: SLP screened, no needs identified, MRI revealed small vessel disease and no acute stroke is evident. Daughter reports that patient is at baseline level of functioning with her memory. SLP will sign off at this time.   Gunnar Fusi, M.A., CCC-SLP [phone]  [place] 07/13/2015, 3:54 PM

## 2015-07-13 NOTE — Evaluation (Addendum)
Physical Therapy Evaluation Patient Details Name: [name] MRN: [id number] DOB: [birth year] Today's Date: 07/13/2015   History of Present Illness  Patient is an 81 y/o female with PMH of HTN, DM, hyperlipidemia, dementia, CAD, PAF, OA who presented with aphasia one day after going to [facility] due to headache and uncontrolled hypertension.  Head CT was negative.  Clinical Impression  Patient presents with decreased balance and safety with ambulation with risk for falls.  Did review fall prevention information with patient and daughter.  Daughter feels she is close to baseline, but feel she will benefit from skilled PT in the acute setting to allow maximized safety and independence prior to d/c home.    Follow Up Recommendations No PT follow up    Equipment Recommendations  None recommended by PT    Recommendations for Other Services       Precautions / Restrictions Precautions Precautions: Fall Restrictions Weight Bearing Restrictions: No      Mobility  Bed Mobility Overal bed mobility: Modified Independent                Transfers Overall transfer level: Needs assistance Equipment used: None Transfers: Sit to/from Stand Sit to Stand: Supervision         General transfer comment: for safety due to mild imbalance  Ambulation/Gait Ambulation/Gait assistance: Min guard;Supervision Ambulation Distance (Feet): 200 Feet Assistive device: None Gait Pattern/deviations: Step-through pattern;Decreased stride length     General Gait Details: veers to right or towards closest wall; reports often holding onto wall at home especially if she gets too close  Stairs Stairs: Yes Stairs assistance: Min guard Stair Management: One rail Right;Step to pattern;Alternating pattern;Forwards Number of Stairs: 4 General stair comments: occasional step to versus step through self selected pattern, cues for safety and assist for stabilizing  Wheelchair Mobility    Modified  Rankin (Stroke Patients Only) Modified Rankin (Stroke Patients Only) Pre-Morbid Rankin Score: Moderate disability Modified Rankin: Moderately severe disability     Balance Overall balance assessment: Needs assistance         Standing balance support: No upper extremity supported Standing balance-Leahy Scale: Fair Standing balance comment: static stance without UE support, felt like I needed to have hands on frequently for safety while walking in hallway                             Pertinent Vitals/Pain Pain Assessment: No/denies pain    Home Living Family/patient expects to be discharged to:: Private residence Living Arrangements: Children Available Help at Discharge: Family;Available 24 hours/day Type of Home: House Home Access: Level entry     Home Layout: Two level;Able to live on main level with bedroom/bathroom Home Equipment: Gilford Rile - 2 wheels;Cane - single point      Prior Function Level of Independence: Needs assistance      ADL's / Homemaking Assistance Needed: family stands by during shower, gets her meals and manages her meds        Hand Dominance   Dominant Hand: Right    Extremity/Trunk Assessment   Upper Extremity Assessment: Overall WFL for tasks assessed           Lower Extremity Assessment: Overall WFL for tasks assessed         Communication   Communication: No difficulties  Cognition Arousal/Alertness: Awake/alert Behavior During Therapy: WFL for tasks assessed/performed Overall Cognitive Status: Impaired/Different from baseline Area of Impairment: Memory;Orientation Orientation Level: Situation;Time;Place;Disoriented to  General Comments: daughter in room constantly reorienting patient, somewhat more confusion than usual may be somewhat due to out of her normal routine/setting    General Comments General comments (skin integrity, edema, etc.): daughter in room and reports patient close to baseline  for ambulaiton, reports she does walk independent in the home, but they check on her if she leaves the room and gone more than 5-6 minutes    Exercises        Assessment/Plan    PT Assessment Patient needs continued PT services  PT Diagnosis Abnormality of gait   PT Problem List Decreased balance;Decreased knowledge of use of DME;Decreased safety awareness;Decreased cognition  PT Treatment Interventions DME instruction;Balance training;Gait training;Neuromuscular re-education;Functional mobility training;Patient/family education;Therapeutic activities;Therapeutic exercise   PT Goals (Current goals can be found in the Care Plan section) Acute Rehab PT Goals Patient Stated Goal: To go home  PT Goal Formulation: With patient/family Time For Goal Achievement: 07/20/15 Potential to Achieve Goals: Good    Frequency Min 3X/week   Barriers to discharge        Co-evaluation               End of Session Equipment Utilized During Treatment: Gait belt Activity Tolerance: Patient tolerated treatment well Patient left: in chair;with family/visitor present;with chair alarm set      Functional Assessment Tool Used: Clinical Judgement Functional Limitation: Mobility: Walking and moving around Mobility: Walking and Moving Around Current Status [phone]): At least 1 percent but less than 20 percent impaired, limited or restricted Mobility: Walking and Moving Around Goal Status [phone]): At least 1 percent but less than 20 percent impaired, limited or restricted    Time: 0908-0939 PT Time Calculation (min) (ACUTE ONLY): 31 min   Charges:   PT Evaluation $Initial PT Evaluation Tier I: 1 Procedure PT Treatments $Gait Training: 8-22 mins   PT G Codes:   PT G-Codes **NOT FOR INPATIENT CLASS** Functional Assessment Tool Used: Clinical Judgement Functional Limitation: Mobility: Walking and moving around Mobility: Walking and Moving Around Current Status (M5465): At least 1 percent but  less than 20 percent impaired, limited or restricted Mobility: Walking and Moving Around Goal Status [phone]): At least 1 percent but less than 20 percent impaired, limited or restricted    [facility] 07/13/2015, 10:12 AM  Magda Kiel, PT [phone] 07/13/2015

## 2015-07-14 ENCOUNTER — Observation Stay (HOSPITAL_COMMUNITY): Payer: Medicare Other

## 2015-07-14 ENCOUNTER — Observation Stay (HOSPITAL_BASED_OUTPATIENT_CLINIC_OR_DEPARTMENT_OTHER): Payer: Medicare Other

## 2015-07-14 DIAGNOSIS — E119 Type 2 diabetes mellitus without complications: Secondary | ICD-10-CM | POA: Diagnosis not present

## 2015-07-14 DIAGNOSIS — G459 Transient cerebral ischemic attack, unspecified: Secondary | ICD-10-CM | POA: Diagnosis not present

## 2015-07-14 DIAGNOSIS — R569 Unspecified convulsions: Secondary | ICD-10-CM

## 2015-07-14 DIAGNOSIS — R55 Syncope and collapse: Secondary | ICD-10-CM | POA: Diagnosis not present

## 2015-07-14 DIAGNOSIS — E785 Hyperlipidemia, unspecified: Secondary | ICD-10-CM | POA: Diagnosis not present

## 2015-07-14 DIAGNOSIS — R4701 Aphasia: Secondary | ICD-10-CM | POA: Diagnosis not present

## 2015-07-14 LAB — BASIC METABOLIC PANEL
Anion gap: 7 (ref 5–15)
BUN: 20 mg/dL (ref 6–20)
CO2: 24 mmol/L (ref 22–32)
Calcium: 9.1 mg/dL (ref 8.9–10.3)
Chloride: 105 mmol/L (ref 101–111)
Creatinine, Ser: 0.96 mg/dL (ref 0.44–1.00)
GFR calc Af Amer: 59 mL/min — ABNORMAL LOW (ref >60)
GFR calc non Af Amer: 51 mL/min — ABNORMAL LOW (ref >60)
Glucose, Bld: 116 mg/dL — ABNORMAL HIGH (ref 65–99)
Potassium: 4 mmol/L (ref 3.5–5.1)
Sodium: 136 mmol/L (ref 135–145)

## 2015-07-14 LAB — GLUCOSE, CAPILLARY
Glucose-Capillary: 147 mg/dL — ABNORMAL HIGH (ref 65–99)
Glucose-Capillary: 146 mg/dL — ABNORMAL HIGH (ref 65–99)
Glucose-Capillary: 177 mg/dL — ABNORMAL HIGH (ref 65–99)
Glucose-Capillary: 173 mg/dL — ABNORMAL HIGH (ref 65–99)
Glucose-Capillary: 115 mg/dL — ABNORMAL HIGH (ref 65–99)

## 2015-07-14 LAB — HEMOGLOBIN A1C
Hgb A1c MFr Bld: 7.9 % — ABNORMAL HIGH (ref 4.8–5.6)
Mean Plasma Glucose: 180 mg/dL

## 2015-07-14 LAB — CBC
HCT: 32.3 % — ABNORMAL LOW (ref 36.0–46.0)
Hemoglobin: 10.5 g/dL — ABNORMAL LOW (ref 12.0–15.0)
MCH: 28.8 pg (ref 26.0–34.0)
MCHC: 32.5 g/dL (ref 30.0–36.0)
MCV: 88.5 fL (ref 78.0–100.0)
Platelets: 192 10*3/uL (ref 150–400)
RBC: 3.65 MIL/uL — ABNORMAL LOW (ref 3.87–5.11)
RDW: 13.6 % (ref 11.5–15.5)
WBC: 3.9 10*3/uL — ABNORMAL LOW (ref 4.0–10.5)

## 2015-07-14 MED ORDER — DEXTROSE 50 % IV SOLN
INTRAVENOUS | Status: AC
  Filled 2015-07-14: qty 50

## 2015-07-14 MED ORDER — INFLUENZA VAC SPLIT QUAD 0.5 ML IM SUSY
0.5000 mL | PREFILLED_SYRINGE | INTRAMUSCULAR | Status: AC
  Administered 2015-07-15: 0.5 mL via INTRAMUSCULAR
  Filled 2015-07-14: qty 0.5

## 2015-07-14 NOTE — Progress Notes (Signed)
Physical Therapy Treatment & Discharge Patient Details Name: [name] MRN: [id number] DOB: Jun 07, 1925 Today's Date: 07/14/2015    History of Present Illness Patient is an 81 y/o female with PMH of HTN, DM, hyperlipidemia, dementia, CAD, PAF, OA who presented with aphasia one day after going to [facility] due to headache and uncontrolled hypertension.  Head CT was negative.    PT Comments    Patient demonstrating improved balance and safety this session.  Family remains in the room and pt more calm though still wanting to go home and get telemetry off.  Will d/c PT as patient likely at functional baseline.  Follow Up Recommendations  No PT follow up     Equipment Recommendations  None recommended by PT    Recommendations for Other Services       Precautions / Restrictions Precautions Precautions: Fall    Mobility  Bed Mobility               General bed mobility comments: up in chair  Transfers Overall transfer level: Modified independent Equipment used: None Transfers: Sit to/from Stand Sit to Stand: Modified independent (Device/Increase time)            Ambulation/Gait Ambulation/Gait assistance: Supervision;Independent Ambulation Distance (Feet): 250 Feet Assistive device: None Gait Pattern/deviations: Step-through pattern;Decreased stride length     General Gait Details: walking and talking and looking around without loss of balance   Stairs Stairs: Yes Stairs assistance: Supervision Stair Management: One rail Right;Alternating pattern;Forwards Number of Stairs: 10 General stair comments: step through pattern consistently  Wheelchair Mobility    Modified Rankin (Stroke Patients Only)       Balance           Standing balance support: No upper extremity supported Standing balance-Leahy Scale: Good                      Cognition Arousal/Alertness: Awake/alert Behavior During Therapy: WFL for tasks assessed/performed Overall  Cognitive Status: History of cognitive impairments - at baseline                      Exercises      General Comments        Pertinent Vitals/Pain Pain Assessment: No/denies pain    Home Living                      Prior Function            PT Goals (current goals can now be found in the care plan section) Progress towards PT goals: Goals met/education completed, patient discharged from PT    Frequency  Min 3X/week    PT Plan Current plan remains appropriate    Co-evaluation             End of Session Equipment Utilized During Treatment: Gait belt Activity Tolerance: Patient tolerated treatment well Patient left: in chair;with family/visitor present     Time: 1545-1600 PT Time Calculation (min) (ACUTE ONLY): 15 min  Charges:  $Gait Training: 8-22 mins                    G Codes:  Functional Assessment Tool Used: Clinical Judgement Functional Limitation: Mobility: Walking and moving around Mobility: Walking and Moving Around Current Status ([id number]): At least 1 percent but less than 20 percent impaired, limited or restricted Mobility: Walking and Moving Around Goal Status [phone]): At least 1 percent but less than 20 percent impaired,  limited or restricted Mobility: Walking and Moving Around Discharge Status [phone]): At least 1 percent but less than 20 percent impaired, limited or restricted   [facility] 07/14/2015, 4:35 PM  Magda Kiel, [place] 07/14/2015

## 2015-07-14 NOTE — Progress Notes (Signed)
VASCULAR LAB PRELIMINARY  PRELIMINARY  PRELIMINARY  PRELIMINARY  Carotid duplex  completed.    Preliminary report:  Bilateral:  1-39% ICA stenosis.  Vertebral artery flow is antegrade.      Kylea Berrong, RVT 07/14/2015, 12:48 PM

## 2015-07-14 NOTE — Consult Note (Signed)
NEURO HOSPITALIST CONSULT NOTE   Referring physician: Dr Hartford Poli Reason for Consult: seizure versus syncope  HPI:                                                                                                                                          [name] is an 81 y.o. female with a past medical history that is pertinent for dementia, hyperlipidemia, CAD, PAF, HTN, DM type II, and GERD, admitted to the hospital 07/11/15 due to language disturbance and this morning sustained an episode concerning for seizure. Daughter is at the bedside and stated that such episode was witnessed by her sister, who said that around 5:30 am her mother was sitting in a chair when suddenly her whole body became stiff and she as unresponsive for 1 or 2 minutes. As per nurse note documented in the chart " She was lower to the ground and revived with a couple of sternum rubs. CBG was done - 147, v/s 153/46, O2- 100 -RA, pulse- 67". Afterwards, there is not reported confusion, tongue biting, bladder or bowel incontinence. According to her daughter, patient had had 2 prior episodes of transient LOC, the last one approximately one year ago " she was standing and suddenly collapsed in my arms and lost control of her bladder". [name] denies HA, vertigo, double vision, focal weakness or numbness, slurred speech, language or vision impairment. Patient had MRI brain done 8/31 which I personally reviewed and showed no acute abnormality. CT brain this morning was independently reviewed and is unremarkable for acute abnormality or other lesion that could explain this event.  Recent labs were also reviewed and are unrevealing.  Past Medical History  Diagnosis Date  . Arrhythmia     paroxysmal atrial fibrillation  . Coronary artery disease   . Hypertension   . Hyperlipidemia   . Edema   . GERD (gastroesophageal reflux disease)   . Anemia B twelve deficiency   . Dizziness - giddy   .  Abdominal pain   . Iron deficiency anemia, unspecified   . Pernicious anemia   . Thoracic or lumbosacral neuritis or radiculitis, unspecified   . Symptomatic menopausal or female climacteric states   . Lumbago   . Benign neoplasm of colon   . Allergic rhinitis, cause unspecified   . Type II diabetes mellitus   . Degeneration of lumbar or lumbosacral intervertebral disc   . Arthritis     "arms and hands" (06/30/2013)    Past Surgical History  Procedure Laterality Date  . Back surgery    . Cholecystectomy    . Appendectomy  1974  . Abdominal hysterectomy  1974  . Carpal tunnel release Bilateral 1970's  . Cataract extraction, bilateral Bilateral   . Cardiac catheterization    .  Coronary angioplasty with stent placement      "1" (06/30/2013  . Lumbar disc surgery      Family History  Problem Relation Age of Onset  . Diabetes Mother   . Cancer Sister     "behind heart"  . Cancer Sister     Family History: no brain tumor, MS, or brain aneurysm.   Social History:  reports that she has never smoked. Her smokeless tobacco use includes Snuff. She reports that she does not drink alcohol or use illicit drugs.  Allergies  Allergen Reactions  . Penicillins Swelling    REACTION: swelling of joints  . Sulfonamide Derivatives Other (See Comments)    REACTION: "like my head was full of water"  . Albumin (Human) Other (See Comments)    Doesn't remember   . Clindamycin Other (See Comments)    Doesn't remember   . Iodides Hives  . Iodinated Diagnostic Agents Hives  . Sulfa Antibiotics Hives  . Celecoxib Itching and Rash  . Erythromycin Itching and Rash  . Nitrofuran Derivatives Rash  . Nitrofurantoin Itching and Rash  . Piroxicam Other (See Comments)    unknown  . Povidone-Iodine Itching and Rash    MEDICATIONS:                                                                                                                     Scheduled: . aspirin EC  81 mg Oral Daily  .  dextrose      . enoxaparin (LOVENOX) injection  40 mg Subcutaneous Q24H  . insulin aspart  0-15 Units Subcutaneous TID WC  . insulin aspart  0-5 Units Subcutaneous QHS  . iron polysaccharides  150 mg Oral Daily  . loratadine  10 mg Oral Daily  . metoprolol succinate  50 mg Oral Daily  . pantoprazole  40 mg Oral Daily  . polyethylene glycol  17 g Oral Daily  . QUEtiapine  50 mg Oral QHS     ROS:                                                                                                                                       History obtained fromchart review and the patient, and family.  General ROS: negative for - chills, fatigue, fever, night sweats, weight gain or weight loss Psychological ROS: negative for - hallucinations, mood swings or suicidal ideation Ophthalmic ROS: negative for -  blurry vision, double vision, eye pain or loss of vision ENT ROS: negative for - epistaxis, nasal discharge, oral lesions, sore throat, tinnitus or vertigo Allergy and Immunology ROS: negative for - hives or itchy/watery eyes Hematological and Lymphatic ROS: negative for - bleeding problems, bruising or swollen lymph nodes Endocrine ROS: negative for - galactorrhea, hair pattern changes, polydipsia/polyuria or temperature intolerance Respiratory ROS: negative for - cough, hemoptysis, shortness of breath or wheezing Cardiovascular ROS: negative for - chest pain, dyspnea on exertion, edema or irregular heartbeat Gastrointestinal ROS: negative for - abdominal pain, diarrhea, hematemesis, nausea/vomiting or stool incontinence Genito-Urinary ROS: negative for - dysuria, hematuria, incontinence or urinary frequency/urgency Musculoskeletal ROS: negative for - joint swelling or muscular weakness Neurological ROS: as noted in HPI Dermatological ROS: negative for rash and skin lesion changes   Physical exam:  Constitutional: well developed, pleasant female in no apparent distress. Blood pressure 174/64,  pulse 72, temperature 98 F (36.7 C), temperature source Oral, resp. rate 18, height '5\' 2"'  (1.575 m), weight 52.759 kg (116 lb 5 oz), SpO2 99 %. Eyes: no jaundice or exophthalmos.  Head: normocephalic. Neck: supple, no bruits, no JVD. Cardiac: no murmurs. Lungs: clear. Abdomen: soft, no tender, no mass. Extremities: no edema, clubbing, or cyanosis.  Skin: no rash  Neurologic Examination:                                                                                                      General: Mental Status: Alert, oriented to place only. Speech fluent without evidence of aphasia.  Able to follow 3 step commands without difficulty. Cranial Nerves: II: Discs flat bilaterally; Visual fields grossly normal, pupils equal, round, reactive to light and accommodation III,IV, VI: ptosis not present, extra-ocular motions intact bilaterally V,VII: smile symmetric, facial light touch sensation normal bilaterally VIII: hearing normal bilaterally IX,X: uvula rises symmetrically XI: bilateral shoulder shrug XII: midline tongue extension without atrophy or fasciculations  Motor: Moves all limbs symmetrically Tone and bulk:normal tone throughout; no atrophy noted Sensory: Pinprick and light touch intact throughout, bilaterally Deep Tendon Reflexes:  1+ all over Plantars: Right: downgoing   Left: downgoing Cerebellar: normal finger-to-nose,  normal heel-to-shin test Gait:  Cautious gait but no ataxia, apraxia of gait, or bradykinesia    Lab Results  Component Value Date/Time   CHOL 230* 07/13/2015 04:22 AM    Results for orders placed or performed during the hospital encounter of 07/12/15 (from the past 48 hour(s))  Glucose, capillary     Status: Abnormal   Collection Time: 07/12/15  3:27 PM  Result Value Ref Range   Glucose-Capillary 132 (H) 65 - 99 mg/dL  Glucose, capillary     Status: Abnormal   Collection Time: 07/12/15  4:22 PM  Result Value Ref Range   Glucose-Capillary 169  (H) 65 - 99 mg/dL  Glucose, capillary     Status: None   Collection Time: 07/12/15  9:38 PM  Result Value Ref Range   Glucose-Capillary 72 65 - 99 mg/dL   Comment 1 Notify RN    Comment 2 Document in Chart  Hemoglobin A1c     Status: Abnormal   Collection Time: 07/13/15  4:22 AM  Result Value Ref Range   Hgb A1c MFr Bld 7.9 (H) 4.8 - 5.6 %    Comment: (NOTE)         Pre-diabetes: 5.7 - 6.4         Diabetes: >6.4         Glycemic control for adults with diabetes: <7.0    Mean Plasma Glucose 180 mg/dL    Comment: (NOTE) Performed At: [facility] [place], [place] [id number] Lindon Romp MD GE:[id number]   Lipid panel     Status: Abnormal   Collection Time: 07/13/15  4:22 AM  Result Value Ref Range   Cholesterol 230 (H) 0 - 200 mg/dL   Triglycerides 122 <150 mg/dL   HDL 56 >40 mg/dL   Total CHOL/HDL Ratio 4.1 RATIO   VLDL 24 0 - 40 mg/dL   LDL Cholesterol 150 (H) 0 - 99 mg/dL    Comment:        Total Cholesterol/HDL:CHD Risk Coronary Heart Disease Risk Table                     Men   Women  1/2 Average Risk   3.4   3.3  Average Risk       5.0   4.4  2 X Average Risk   9.6   7.1  3 X Average Risk  23.4   11.0        Use the calculated Patient Ratio above and the CHD Risk Table to determine the patient's CHD Risk.        ATP III CLASSIFICATION (LDL):  <100     mg/dL   Optimal  100-129  mg/dL   Near or Above                    Optimal  130-159  mg/dL   Borderline  160-189  mg/dL   High  >190     mg/dL   Very High   Glucose, capillary     Status: Abnormal   Collection Time: 07/13/15  6:52 AM  Result Value Ref Range   Glucose-Capillary 126 (H) 65 - 99 mg/dL   Comment 1 Notify RN    Comment 2 Document in Chart   Glucose, capillary     Status: Abnormal   Collection Time: 07/13/15 11:33 AM  Result Value Ref Range   Glucose-Capillary 126 (H) 65 - 99 mg/dL   Comment 1 Notify RN    Comment 2 Document in Chart   Glucose, capillary      Status: None   Collection Time: 07/13/15  4:03 PM  Result Value Ref Range   Glucose-Capillary 87 65 - 99 mg/dL   Comment 1 Notify RN    Comment 2 Document in Chart   Glucose, capillary     Status: Abnormal   Collection Time: 07/13/15 10:46 PM  Result Value Ref Range   Glucose-Capillary 109 (H) 65 - 99 mg/dL   Comment 1 Notify RN    Comment 2 Document in Chart   Basic metabolic panel     Status: Abnormal   Collection Time: 07/14/15  4:04 AM  Result Value Ref Range   Sodium 136 135 - 145 mmol/L   Potassium 4.0 3.5 - 5.1 mmol/L   Chloride 105 101 - 111 mmol/L   CO2 24 22 - 32 mmol/L   Glucose,  Bld 116 (H) 65 - 99 mg/dL   BUN 20 6 - 20 mg/dL   Creatinine, Ser 0.96 0.44 - 1.00 mg/dL   Calcium 9.1 8.9 - 10.3 mg/dL   GFR calc non Af Amer 51 (L) >60 mL/min   GFR calc Af Amer 59 (L) >60 mL/min    Comment: (NOTE) The eGFR has been calculated using the CKD EPI equation. This calculation has not been validated in all clinical situations. eGFR's persistently <60 mL/min signify possible Chronic Kidney Disease.    Anion gap 7 5 - 15  CBC     Status: Abnormal   Collection Time: 07/14/15  4:04 AM  Result Value Ref Range   WBC 3.9 (L) 4.0 - 10.5 K/uL   RBC 3.65 (L) 3.87 - 5.11 MIL/uL   Hemoglobin 10.5 (L) 12.0 - 15.0 g/dL   HCT 32.3 (L) 36.0 - 46.0 %   MCV 88.5 78.0 - 100.0 fL   MCH 28.8 26.0 - 34.0 pg   MCHC 32.5 30.0 - 36.0 g/dL   RDW 13.6 11.5 - 15.5 %   Platelets 192 150 - 400 K/uL  Glucose, capillary     Status: Abnormal   Collection Time: 07/14/15  5:50 AM  Result Value Ref Range   Glucose-Capillary 147 (H) 65 - 99 mg/dL  Glucose, capillary     Status: Abnormal   Collection Time: 07/14/15  6:43 AM  Result Value Ref Range   Glucose-Capillary 146 (H) 65 - 99 mg/dL   Comment 1 Notify RN    Comment 2 Document in Chart   Glucose, capillary     Status: Abnormal   Collection Time: 07/14/15 11:20 AM  Result Value Ref Range   Glucose-Capillary 177 (H) 65 - 99 mg/dL   Comment 1  Notify RN    Comment 2 Document in Chart     Ct Head Wo Contrast  07/14/2015   CLINICAL DATA:  Syncope, poor historian ; history coronary artery disease, hypertension, hyperlipidemia, anemia, type II diabetes mellitus  EXAM: CT HEAD WITHOUT CONTRAST  TECHNIQUE: Contiguous axial images were obtained from the base of the skull through the vertex without intravenous contrast.  COMPARISON:  07/12/2015 CT head and MR brain exams  FINDINGS: Mild atrophy.  Normal ventricular morphology.  No midline shift or mass effect.  Small vessel chronic ischemic changes of deep cerebral white matter.  No intracranial hemorrhage, mass lesion or evidence acute infarction.  No extra-axial fluid collections.  Mucosal thickening throughout ethmoid air cells and sphenoid sinus.  Extensive atherosclerotic calcifications of the carotid siphons bilaterally.  Osseous structures unremarkable.  IMPRESSION: Small vessel chronic ischemic changes of deep cerebral white matter.  No acute intracranial abnormalities.  Mild sinus disease changes.   Electronically Signed   By: Lavonia Dana M.D.   On: 07/14/2015 08:16   Mr Jodene Nam Head Wo Contrast  07/12/2015   CLINICAL DATA:  Confusion and speech difficulty. Recent ER visits for headache and hypertension.  EXAM: MRI HEAD WITHOUT CONTRAST  MRA HEAD WITHOUT CONTRAST  TECHNIQUE: Multiplanar, multiecho pulse sequences of the brain and surrounding structures were obtained without intravenous contrast. Angiographic images of the head were obtained using MRA technique without contrast.  COMPARISON:  CT head 07/12/2015.  CT head 07/11/2015.  FINDINGS: MRI HEAD FINDINGS  The patient was unable to remain motionless for the exam. Small or subtle lesions could be overlooked.  No evidence for acute infarction, hemorrhage, mass lesion, or extra-axial fluid. Generalized atrophy. Hydrocephalus ex vacuo. Moderately advanced T2  and FLAIR hyperintensity throughout the periventricular and subcortical white matter  suggesting small vessel disease.  Flow voids are maintained throughout the carotid, basilar, and vertebral arteries. There are no areas of chronic hemorrhage.  Pituitary, pineal, and cerebellar tonsils unremarkable. No upper cervical lesions.  Extracranial soft tissues show no gross abnormality.  MRA HEAD FINDINGS  The internal carotid arteries are dolichoectatic but widely patent. Slight inferior cavernous outpouching, 2 mm wide necked, consistent with atheromatous disease.  Basilar artery widely patent with vertebrals codominant. Fetal origin RIGHT PCA.  No flow-limiting M1 MCA stenosis. Slight irregularity distal M1 on the LEFT.  Diffusely diseased RIGHT A1 ACA. Both anterior cerebral arteries distally appear sufficiently patent, and supplied by the LEFT anterior cerebral.  Moderately irregular proximal BILATERAL PCA segments and distal M2/M3 middle cerebral artery segments felt to represent intracranial atherosclerotic disease.  IMPRESSION: Motion degraded exam demonstrating atrophy and small vessel disease. No acute stroke is evident.  No proximal flow reducing large vessel stenosis or occlusion. Intracranial atherosclerotic disease is evident.   Electronically Signed   By: Staci Righter M.D.   On: 07/12/2015 13:48   Mr Brain Wo Contrast  07/12/2015   CLINICAL DATA:  Confusion and speech difficulty. Recent ER visits for headache and hypertension.  EXAM: MRI HEAD WITHOUT CONTRAST  MRA HEAD WITHOUT CONTRAST  TECHNIQUE: Multiplanar, multiecho pulse sequences of the brain and surrounding structures were obtained without intravenous contrast. Angiographic images of the head were obtained using MRA technique without contrast.  COMPARISON:  CT head 07/12/2015.  CT head 07/11/2015.  FINDINGS: MRI HEAD FINDINGS  The patient was unable to remain motionless for the exam. Small or subtle lesions could be overlooked.  No evidence for acute infarction, hemorrhage, mass lesion, or extra-axial fluid. Generalized atrophy.  Hydrocephalus ex vacuo. Moderately advanced T2 and FLAIR hyperintensity throughout the periventricular and subcortical white matter suggesting small vessel disease.  Flow voids are maintained throughout the carotid, basilar, and vertebral arteries. There are no areas of chronic hemorrhage.  Pituitary, pineal, and cerebellar tonsils unremarkable. No upper cervical lesions.  Extracranial soft tissues show no gross abnormality.  MRA HEAD FINDINGS  The internal carotid arteries are dolichoectatic but widely patent. Slight inferior cavernous outpouching, 2 mm wide necked, consistent with atheromatous disease.  Basilar artery widely patent with vertebrals codominant. Fetal origin RIGHT PCA.  No flow-limiting M1 MCA stenosis. Slight irregularity distal M1 on the LEFT.  Diffusely diseased RIGHT A1 ACA. Both anterior cerebral arteries distally appear sufficiently patent, and supplied by the LEFT anterior cerebral.  Moderately irregular proximal BILATERAL PCA segments and distal M2/M3 middle cerebral artery segments felt to represent intracranial atherosclerotic disease.  IMPRESSION: Motion degraded exam demonstrating atrophy and small vessel disease. No acute stroke is evident.  No proximal flow reducing large vessel stenosis or occlusion. Intracranial atherosclerotic disease is evident.   Electronically Signed   By: Staci Righter M.D.   On: 07/12/2015 13:48   Assessment/Plan: 81 y/o with dementia,hyperlipidemia, CAD, PAF, HTN, DM type II, who this morning sustained an isolated episode of generalized body stiffness with impairment of consciousness that lasted approximately 1 or 2 minutes. First episode in life with such characteristics, although daughter report prior episodes of brief LOC while standing with concomitant incontinence of bladder. Syncope versus tonic seizure. Recent MRI brain and CT brain this morning are unremarkable for acute abnormality or other lesion that could explain this event. EEG ordered,  results pending.  No AED at this moment. Will follow up.  [facility],  MD 07/14/2015, 11:35 AM  [facility]

## 2015-07-14 NOTE — Progress Notes (Signed)
Patient sitting up in chair with family at bedside no complaints voiced

## 2015-07-14 NOTE — Procedures (Signed)
ELECTROENCEPHALOGRAM REPORT  Date of Study: 07/14/2015  Patient's Name: [name] MRN: [id number] Date of Birth: [birth year]  Referring Provider: Dr. Domenic Polite  Clinical History: This is an 81 year old woman with an isolated episode of generalized body stiffness with impairment of consciousness that lasted approximately 1 or 2 minutes. Daughter reports prior episodes of brief LOC while standing with concomitant incontinence of bladder.  Medications: acetaminophen (TYLENOL) tablet 650 mg aspirin EC tablet 81 mg calcium carbonate (TUMS - dosed in mg elemental calcium) chewable tablet 200 mg of elemental calcium loratadine (CLARITIN) tablet 10 mg metoprolol succinate (TOPROL-XL) 24 hr tablet 50 mg pantoprazole (PROTONIX) EC tablet 40 mg QUEtiapine (SEROQUEL) tablet 50 mg  Technical Summary: A multichannel digital EEG recording measured by the international 10-20 system with electrodes applied with paste and impedances below 5000 ohms performed in our laboratory with EKG monitoring in an awake and drowsy patient.  Hyperventilation and photic stimulation were not performed.  The digital EEG was referentially recorded, reformatted, and digitally filtered in a variety of bipolar and referential montages for optimal display.    Description: The patient is awake and drowsy during the recording.  During maximal wakefulness, there is a symmetric, medium voltage 9.5-10 Hz posterior dominant rhythm that attenuates with eye opening.  The record is symmetric.  During drowsiness, there is an increase in theta slowing of the background, with shifting asymmetry seen over the bilateral temporal regions. Deeper stages of sleep were not seen. Hyperventilation and photic stimulation were not performed.  There were no epileptiform discharges or electrographic seizures seen.    EKG lead was unremarkable.  Impression: This awake and drowsy EEG is normal.    Clinical Correlation: A normal EEG does  not exclude a clinical diagnosis of epilepsy. Clinical correlation is advised.   Ellouise Newer, M.D.

## 2015-07-14 NOTE — Progress Notes (Signed)
[facility] PROGRESS NOTE   [name] IPJ:[id number] DOB: [birth year] DOA: 07/12/2015 PCP: Renato Shin, MD  HPI/Subjective: Patient has seizure-like activity earlier today. According to the family members she was sitting on bed for about an hour, has episode of stiffness and unresponsiveness. CT and EEG ordered.  Assessment/Plan: Principal Problem:   TIA (transient ischemic attack) Active Problems:   Dyslipidemia   Iron deficiency anemia   Allergic rhinitis   Headache   Diabetes mellitus type 2, controlled   Uncontrolled hypertension   Aphasia   FTT (failure to thrive) in adult   Sinusitis   TIA/Aphasia -Patient presented to the hospital with a slurred speech, resolved by the time she came into the hospital. -MRI of the brain showed no acute events. -LDL is 150, statin. -PT/OT/SLP evaluations -Patient on baby aspirin at presentation and will continue since has documented issues of rash with aspirin products and current symptoms are mild.  Unresponsive episode -Family mentioned seizure-like activity with stiffening and unresponsiveness. -Likely to be syncopal episode as patient was sitting on the chair for more than an hour. -CT scan, EEG and neurology consult.   Headache -Recent issues with poorly controlled blood pressure so this could be the etiology to the patient's headache -Also may be related to sinusitis -Feels better since yesterday.   Allergic rhinitis/Sinusitis -begin saline nasal spray; with uncontrolled blood pressure unable to use medications that contain phenylephrine -Begin Claritin.   Diabetes mellitus type 2, controlled -Hold preadmission metformin -Check CBGs and provide SSI   Uncontrolled hypertension -Currently controlled but in the preceding days has been poorly controlled and very labile -Daughter also reports recent issues with increasing agitation and confusion related to dementia so these periods of agitation may be  contributing to poorly controlled blood pressure -Family denies any use of phenylephrine containing decongestant.    Dyslipidemia -not on statin   Iron deficiency anemia -Hemoglobin stable and at baseline -Continue preadmission iron pill   Dementia/ FTT (failure to thrive) in adult -Follow-up on as SLP evaluation -Reported weight loss -Suspect patient's dementia is slowly progressing -Start Seroquel at night.  Code Status: Full Code Family Communication: Plan discussed with the patient. Disposition Plan: Remains inpatient Diet: Diet heart healthy/carb modified Room service appropriate?: Yes; Fluid consistency:: Thin  Consultants:  None  Procedures:  None  Antibiotics:  None   Objective: Filed Vitals:   07/14/15 0957  BP: 174/64  Pulse: 72  Temp: 98 F (36.7 C)  Resp: 18    Intake/Output Summary (Last 24 hours) at 07/14/15 1102 Last data filed at 07/13/15 1304  Gross per 24 hour  Intake    480 ml  Output      0 ml  Net    480 ml   Filed Weights   07/12/15 2120  Weight: 52.759 kg (116 lb 5 oz)    Exam: General: Alert and awake, oriented x3, not in any acute distress. HEENT: anicteric sclera, pupils reactive to light and accommodation, EOMI CVS: S1-S2 clear, no murmur rubs or gallops Chest: clear to auscultation bilaterally, no wheezing, rales or rhonchi Abdomen: soft nontender, nondistended, normal bowel sounds, no organomegaly Extremities: no cyanosis, clubbing or edema noted bilaterally Neuro: Cranial nerves II-XII intact, no focal neurological deficits  Data Reviewed: Basic Metabolic Panel:  Recent Labs Lab 07/11/15 1642 07/12/15 0815 07/14/15 0404  NA 139 135 136  K 3.9 3.8 4.0  CL 105 102 105  CO2 26 26 24   GLUCOSE 135* 206* 116*  BUN 24* 19 20  CREATININE 0.92 1.01* 0.96  CALCIUM 9.3 9.3 9.1   Liver Function Tests: No results for input(s): AST, ALT, ALKPHOS, BILITOT, PROT, ALBUMIN in the last 168 hours. No results for  input(s): LIPASE, AMYLASE in the last 168 hours. No results for input(s): AMMONIA in the last 168 hours. CBC:  Recent Labs Lab 07/11/15 1642 07/12/15 0815 07/14/15 0404  WBC 4.3 3.8* 3.9*  HGB 11.4* 10.8* 10.5*  HCT 34.7* 33.5* 32.3*  MCV 89.2 88.2 88.5  PLT 221 199 192   Cardiac Enzymes: No results for input(s): CKTOTAL, CKMB, CKMBINDEX, TROPONINI in the last 168 hours. BNP (last 3 results) No results for input(s): BNP in the last 8760 hours.  ProBNP (last 3 results) No results for input(s): PROBNP in the last 8760 hours.  CBG:  Recent Labs Lab 07/13/15 1133 07/13/15 1603 07/13/15 2246 07/14/15 0550 07/14/15 0643  GLUCAP 126* 87 109* 147* 146*    Micro No results found for this or any previous visit (from the past 240 hour(s)).   Studies: Dg Chest 2 View  07/12/2015   CLINICAL DATA:  81 year old female with lethargy, possible TIA. Initial encounter.  EXAM: CHEST  2 VIEW  COMPARISON:  10/31/2014 and earlier.  FINDINGS: Stable lung volumes with moderate elevation of the right hemidiaphragm. Normal cardiac size and mediastinal contours. Chronic calcified atherosclerosis of the aorta. No pneumothorax, pulmonary edema, pleural effusion or confluent pulmonary opacity. No acute osseous abnormality identified. Cholecystectomy clips re- identified.  IMPRESSION: No acute cardiopulmonary abnormality.   Electronically Signed   By: Genevie Ann M.D.   On: 07/12/2015 11:25   Ct Head Wo Contrast  07/14/2015   CLINICAL DATA:  Syncope, poor historian ; history coronary artery disease, hypertension, hyperlipidemia, anemia, type II diabetes mellitus  EXAM: CT HEAD WITHOUT CONTRAST  TECHNIQUE: Contiguous axial images were obtained from the base of the skull through the vertex without intravenous contrast.  COMPARISON:  07/12/2015 CT head and MR brain exams  FINDINGS: Mild atrophy.  Normal ventricular morphology.  No midline shift or mass effect.  Small vessel chronic ischemic changes of deep  cerebral white matter.  No intracranial hemorrhage, mass lesion or evidence acute infarction.  No extra-axial fluid collections.  Mucosal thickening throughout ethmoid air cells and sphenoid sinus.  Extensive atherosclerotic calcifications of the carotid siphons bilaterally.  Osseous structures unremarkable.  IMPRESSION: Small vessel chronic ischemic changes of deep cerebral white matter.  No acute intracranial abnormalities.  Mild sinus disease changes.   Electronically Signed   By: Lavonia Dana M.D.   On: 07/14/2015 08:16   Mr Jodene Nam Head Wo Contrast  07/12/2015   CLINICAL DATA:  Confusion and speech difficulty. Recent ER visits for headache and hypertension.  EXAM: MRI HEAD WITHOUT CONTRAST  MRA HEAD WITHOUT CONTRAST  TECHNIQUE: Multiplanar, multiecho pulse sequences of the brain and surrounding structures were obtained without intravenous contrast. Angiographic images of the head were obtained using MRA technique without contrast.  COMPARISON:  CT head 07/12/2015.  CT head 07/11/2015.  FINDINGS: MRI HEAD FINDINGS  The patient was unable to remain motionless for the exam. Small or subtle lesions could be overlooked.  No evidence for acute infarction, hemorrhage, mass lesion, or extra-axial fluid. Generalized atrophy. Hydrocephalus ex vacuo. Moderately advanced T2 and FLAIR hyperintensity throughout the periventricular and subcortical white matter suggesting small vessel disease.  Flow voids are maintained throughout the carotid, basilar, and vertebral arteries. There are no areas of chronic hemorrhage.  Pituitary, pineal, and cerebellar tonsils unremarkable. No upper cervical  lesions.  Extracranial soft tissues show no gross abnormality.  MRA HEAD FINDINGS  The internal carotid arteries are dolichoectatic but widely patent. Slight inferior cavernous outpouching, 2 mm wide necked, consistent with atheromatous disease.  Basilar artery widely patent with vertebrals codominant. Fetal origin RIGHT PCA.  No  flow-limiting M1 MCA stenosis. Slight irregularity distal M1 on the LEFT.  Diffusely diseased RIGHT A1 ACA. Both anterior cerebral arteries distally appear sufficiently patent, and supplied by the LEFT anterior cerebral.  Moderately irregular proximal BILATERAL PCA segments and distal M2/M3 middle cerebral artery segments felt to represent intracranial atherosclerotic disease.  IMPRESSION: Motion degraded exam demonstrating atrophy and small vessel disease. No acute stroke is evident.  No proximal flow reducing large vessel stenosis or occlusion. Intracranial atherosclerotic disease is evident.   Electronically Signed   By: Staci Righter M.D.   On: 07/12/2015 13:48   Mr Brain Wo Contrast  07/12/2015   CLINICAL DATA:  Confusion and speech difficulty. Recent ER visits for headache and hypertension.  EXAM: MRI HEAD WITHOUT CONTRAST  MRA HEAD WITHOUT CONTRAST  TECHNIQUE: Multiplanar, multiecho pulse sequences of the brain and surrounding structures were obtained without intravenous contrast. Angiographic images of the head were obtained using MRA technique without contrast.  COMPARISON:  CT head 07/12/2015.  CT head 07/11/2015.  FINDINGS: MRI HEAD FINDINGS  The patient was unable to remain motionless for the exam. Small or subtle lesions could be overlooked.  No evidence for acute infarction, hemorrhage, mass lesion, or extra-axial fluid. Generalized atrophy. Hydrocephalus ex vacuo. Moderately advanced T2 and FLAIR hyperintensity throughout the periventricular and subcortical white matter suggesting small vessel disease.  Flow voids are maintained throughout the carotid, basilar, and vertebral arteries. There are no areas of chronic hemorrhage.  Pituitary, pineal, and cerebellar tonsils unremarkable. No upper cervical lesions.  Extracranial soft tissues show no gross abnormality.  MRA HEAD FINDINGS  The internal carotid arteries are dolichoectatic but widely patent. Slight inferior cavernous outpouching, 2 mm wide  necked, consistent with atheromatous disease.  Basilar artery widely patent with vertebrals codominant. Fetal origin RIGHT PCA.  No flow-limiting M1 MCA stenosis. Slight irregularity distal M1 on the LEFT.  Diffusely diseased RIGHT A1 ACA. Both anterior cerebral arteries distally appear sufficiently patent, and supplied by the LEFT anterior cerebral.  Moderately irregular proximal BILATERAL PCA segments and distal M2/M3 middle cerebral artery segments felt to represent intracranial atherosclerotic disease.  IMPRESSION: Motion degraded exam demonstrating atrophy and small vessel disease. No acute stroke is evident.  No proximal flow reducing large vessel stenosis or occlusion. Intracranial atherosclerotic disease is evident.   Electronically Signed   By: Staci Righter M.D.   On: 07/12/2015 13:48    Scheduled Meds: . aspirin EC  81 mg Oral Daily  . dextrose      . enoxaparin (LOVENOX) injection  40 mg Subcutaneous Q24H  . insulin aspart  0-15 Units Subcutaneous TID WC  . insulin aspart  0-5 Units Subcutaneous QHS  . iron polysaccharides  150 mg Oral Daily  . loratadine  10 mg Oral Daily  . metoprolol succinate  50 mg Oral Daily  . pantoprazole  40 mg Oral Daily  . polyethylene glycol  17 g Oral Daily  . QUEtiapine  50 mg Oral QHS   Continuous Infusions:      Time spent: 35 minutes    [facility] A  [facility] Pager [phone] If 7PM-7AM, please contact night-coverage at [email or web address], password [facility] 07/14/2015, 11:02 AM

## 2015-07-14 NOTE — Progress Notes (Signed)
Pt was found unresponsive around 0530 while siting on a chair in 5C13 with daughter present. She was lower to the ground and revived with a couple of sternum rubs. CBG was done - 147, v/s 153/46, O2- 100 -RA, pulse- 67. Rapid response nurse was called, who came up and saw pt. Dr Broadus John was also notified, and orders were given for EEG and CT head. Pt is resting without any problems at the moment with family members at bed side. Will cont to monitor

## 2015-07-14 NOTE — Progress Notes (Signed)
Bedside EEG completed, results pending. 

## 2015-07-15 DIAGNOSIS — J309 Allergic rhinitis, unspecified: Secondary | ICD-10-CM | POA: Diagnosis not present

## 2015-07-15 DIAGNOSIS — R4701 Aphasia: Secondary | ICD-10-CM | POA: Diagnosis not present

## 2015-07-15 DIAGNOSIS — G459 Transient cerebral ischemic attack, unspecified: Secondary | ICD-10-CM | POA: Diagnosis not present

## 2015-07-15 DIAGNOSIS — R569 Unspecified convulsions: Secondary | ICD-10-CM | POA: Diagnosis not present

## 2015-07-15 DIAGNOSIS — E119 Type 2 diabetes mellitus without complications: Secondary | ICD-10-CM | POA: Diagnosis not present

## 2015-07-15 LAB — GLUCOSE, CAPILLARY: Glucose-Capillary: 123 mg/dL — ABNORMAL HIGH (ref 65–99)

## 2015-07-15 MED ORDER — QUETIAPINE FUMARATE 50 MG PO TABS: 50.0000 mg | ORAL_TABLET | Freq: Every day | ORAL | Status: DC

## 2015-07-15 NOTE — Progress Notes (Signed)
Pt discharged to home with family at 1215. Left floor via wheelchair with CNA. Wendee Copp

## 2015-07-15 NOTE — Progress Notes (Signed)
[facility]UR[facility] HOSPITALIST PROGRESS NOTE   SUBJECTIVE:                                                                                                                        No further events concerning for seizures. Family is at the bedside. EEG normal.  OBJECTIVE:                                                                                                                           Vital signs in last 24 hours: Temp:  [97.4 F (36.3 C)-98.8 F (37.1 C)] 97.4 F (36.3 C) (09/03 4010) Pulse Rate:  [66-72] 69 (09/03 0613) Resp:  [18] 18 (09/03 0613) BP: (145-178)/(53-64) 154/58 mmHg (09/03 0613) SpO2:  [96 %-100 %] 98 % (09/03 2725)  Intake/Output from previous day: 09/02 0701 - 09/03 0700 In: 300 [P.O.:300] Out: -  Intake/Output this shift:   Nutritional status: Diet heart healthy/carb modified Room service appropriate?: Yes; Fluid consistency:: Thin  Past Medical History  Diagnosis Date  . Arrhythmia     paroxysmal atrial fibrillation  . Coronary artery disease   . Hypertension   . Hyperlipidemia   . Edema   . GERD (gastroesophageal reflux disease)   . Anemia B twelve deficiency   . Dizziness - giddy   . Abdominal pain   . Iron deficiency anemia, unspecified   . Pernicious anemia   . Thoracic or lumbosacral neuritis or radiculitis, unspecified   . Symptomatic menopausal or female climacteric states   . Lumbago   . Benign neoplasm of colon   . Allergic rhinitis, cause unspecified   . Type II diabetes mellitus   . Degeneration of lumbar or lumbosacral intervertebral disc   . Arthritis     "arms and hands" (06/30/2013)   Physical exam:  Constitutional: well developed, pleasant female in no apparent distress Eyes: no jaundice or exophthalmos.  Head: normocephalic. Neck: supple, no bruits, no JVD. Cardiac: no murmurs. Lungs: clear. Abdomen: soft, no tender, no mass. Extremities: no edema, clubbing, or cyanosis.  Skin: no  rash  Neurologic Exam:  General: Mental Status: Alert, oriented to place only. Speech fluent without evidence of aphasia. Able to follow 3 step commands without difficulty. Cranial Nerves: II: Discs  flat bilaterally; Visual fields grossly normal, pupils equal, round, reactive to light and accommodation III,IV, VI: ptosis not present, extra-ocular motions intact bilaterally V,VII: smile symmetric, facial light touch sensation normal bilaterally VIII: hearing normal bilaterally IX,X: uvula rises symmetrically XI: bilateral shoulder shrug XII: midline tongue extension without atrophy or fasciculations  Motor: Moves all limbs symmetrically Tone and bulk:normal tone throughout; no atrophy noted Sensory: Pinprick and light touch intact throughout, bilaterally Deep Tendon Reflexes:  1+ all over Plantars: Right: downgoingLeft: downgoing Cerebellar: normal finger-to-nose, normal heel-to-shin test Gait:  Cautious gait but no ataxia, apraxia of gait, or bradykinesia  Lab Results: Lab Results  Component Value Date/Time   CHOL 230* 07/13/2015 04:22 AM   Lipid Panel  Recent Labs  07/13/15 0422  CHOL 230*  TRIG 122  HDL 56  CHOLHDL 4.1  VLDL 24  LDLCALC 150*    Studies/Results: Ct Head Wo Contrast  07/14/2015   CLINICAL DATA:  Syncope, poor historian ; history coronary artery disease, hypertension, hyperlipidemia, anemia, type II diabetes mellitus  EXAM: CT HEAD WITHOUT CONTRAST  TECHNIQUE: Contiguous axial images were obtained from the base of the skull through the vertex without intravenous contrast.  COMPARISON:  07/12/2015 CT head and MR brain exams  FINDINGS: Mild atrophy.  Normal ventricular morphology.  No midline shift or mass effect.  Small vessel chronic ischemic changes of deep cerebral white matter.  No intracranial hemorrhage, mass lesion or evidence acute infarction.  No extra-axial fluid collections.  Mucosal thickening throughout  ethmoid air cells and sphenoid sinus.  Extensive atherosclerotic calcifications of the carotid siphons bilaterally.  Osseous structures unremarkable.  IMPRESSION: Small vessel chronic ischemic changes of deep cerebral white matter.  No acute intracranial abnormalities.  Mild sinus disease changes.   Electronically Signed   By: Lavonia Dana M.D.   On: 07/14/2015 08:16    MEDICATIONS                                                                                                                        Scheduled: . aspirin EC  81 mg Oral Daily  . enoxaparin (LOVENOX) injection  40 mg Subcutaneous Q24H  . Influenza vac split quadrivalent PF  0.5 mL Intramuscular Tomorrow-1000  . insulin aspart  0-15 Units Subcutaneous TID WC  . insulin aspart  0-5 Units Subcutaneous QHS  . iron polysaccharides  150 mg Oral Daily  . loratadine  10 mg Oral Daily  . metoprolol succinate  50 mg Oral Daily  . pantoprazole  40 mg Oral Daily  . polyethylene glycol  17 g Oral Daily  . QUEtiapine  50 mg Oral QHS    ASSESSMENT/PLAN:  81 y/o with dementia,hyperlipidemia, CAD, PAF, HTN, DM type II, who this morning sustained an isolated episode of generalized body stiffness with impairment of consciousness that lasted approximately 1 or 2 minutes. First episode in life with such characteristics, although daughter report prior episodes of brief LOC while standing with concomitant incontinence of bladder. Syncope versus tonic seizure. Recent MRI brain and CT brain are unremarkable for acute abnormality or other lesion that could explain this event. EEG ordered negative. Certainly challenging to establish whether or not she had a syncope with tonic posturing or frankly a tonic seizure. Her underlying dementia obviously predisposes her to seizures, but in this particular scenario I am not quite sure AED treatment should be  initiated. Patient had had prior, very sporadic spells of transient LOC with bladder incontinence and I discussed with family and patient prons and cons of starting anticonvulsant treatment at this moment and we agreed on a more conservative approach and and delaying treatment unless those events become more frequent. Neurology will sign off. Recommend neurology follow up in 4 to 6 weeks.  [name], MD [facility] [phone]  07/15/2015, 8:53 AM

## 2015-07-15 NOTE — Discharge Summary (Signed)
Physician Discharge Summary  [name] IHW:[id number] DOB: 1925-10-29 DOA: 07/12/2015  PCP: Renato Shin, MD  Admit date: 07/12/2015 Discharge date: 07/15/2015  Time spent: 40 minutes  Recommendations for Outpatient Follow-up:  1. Follow-up with primary care physician within one week.  2. Follow-up with neurology in 2-3 weeks  Discharge Diagnoses:  Principal Problem:   TIA (transient ischemic attack) Active Problems:   Dyslipidemia   Iron deficiency anemia   Allergic rhinitis   Headache   Diabetes mellitus type 2, controlled   Uncontrolled hypertension   Aphasia   FTT (failure to thrive) in adult   Sinusitis   Discharge Condition: Stable  Diet recommendation: Heart healthy  Filed Weights   07/12/15 2120  Weight: 52.759 kg (116 lb 5 oz)    History of present illness:  This is a 81 year old female patient with a history diabetes, hypertension, dyslipidemia, CAD,dementia, paroxysmal atrial fibrillation, osteoarthritis as well as allergic rhinitis with recurrent headache who presents to the ER with complaints of aphasia.patient was initially evaluated in the [facility] on 8/30 for issues related to uncontrolled hypertension and frontal headache. Evaluation at that time included a CT which was negative. No indication for admission to the hospital so patient was discharged from the ER. She returns to the ER today after developing aphasia duration about 30-35 minutes that occurred while patient was attempting deep breakfast this morning. According to her family she was "babbling" during this time. In addition she appeared to be more confused than baseline and the cup she was holding the shaking and beginning to spell. She appeared to have difficulty holding a cup.there were no other focal neurological signs appreciated. EMS was called to the home and by the time they arrived symptoms had resolved.  In the ER the patient was afebrile, BP was 168/61, pulse 62 and regular,  respirations 12 with room air saturations at 100%. CT of the head was obtained showed no acute process except for mild sinusitis.laboratory data was unremarkable except for mildly elevated glucose at 206. EKG reveals sinus rhythm with no acute ischemic changes. Patient reports continued frontal headache.she was given Reglan and Benadryl for her headache by the EDP.  Hospital Course:   TIA/Aphasia -Patient presented to the hospital with a slurred speech, resolved by the time she came into the hospital. -MRI of the brain showed no acute events. -LDL is 150, statin. -PT/OT/SLP evaluations -Patient is on baby aspirin, that is continued.  Unresponsive episode -Family mentioned seizure-like activity with stiffening and unresponsiveness. -Likely to be syncopal episode as patient was sitting on the chair for more than an hour. -Neurology evaluated the patient, both repeated CT scan and EEG did not show acute findings. -Neurology recommended no AED for now follow-up with neurology in 2-3 weeks as outpatient.   Headache -Recent issues with poorly controlled blood pressure so this could be the etiology to the patient's headache -Patient also is getting Claritin-D as outpatient which contains phenylephrine, counseled not to take decongest ounces. -Feels better since yesterday.   Allergic rhinitis/Sinusitis -begin saline nasal spray; with uncontrolled blood pressure unable to use medications that contain phenylephrine -Begin Claritin.   Diabetes mellitus type 2, controlled -Hold preadmission metformin -Check CBGs and provide SSI, on discharge home medications restarted.   Uncontrolled hypertension -Currently controlled but in the preceding days has been poorly controlled and very labile -Daughter also reports recent issues with increasing agitation and confusion related to dementia so these periods of agitation associated with refusal of medications. -Patient was  taken Claritin-D, which  containing phenylephrine, likely this is contributed to high blood pressure.   Dyslipidemia -not on statin   Iron deficiency anemia -Hemoglobin stable and at baseline -Continue preadmission iron pill   Dementia/ FTT (failure to thrive) in adult -Follow-up on as SLP evaluation -Reported weight loss -Suspect patient's dementia is slowly progressing -Start Seroquel at night, for agitation.   Procedures:  None  Consultations:  Neurology  Discharge Exam: Filed Vitals:   07/15/15 0952  BP: 159/69  Pulse: 76  Temp: 98.3 F (36.8 C)  Resp: 18   General: Alert and awake, oriented x3, not in any acute distress. HEENT: anicteric sclera, pupils reactive to light and accommodation, EOMI CVS: S1-S2 clear, no murmur rubs or gallops Chest: clear to auscultation bilaterally, no wheezing, rales or rhonchi Abdomen: soft nontender, nondistended, normal bowel sounds, no organomegaly Extremities: no cyanosis, clubbing or edema noted bilaterally Neuro: Cranial nerves II-XII intact, no focal neurological deficits  Discharge Instructions   Discharge Instructions    Diet - low sodium heart healthy    Complete by:  As directed      Increase activity slowly    Complete by:  As directed           Current Discharge Medication List    START taking these medications   Details  QUEtiapine (SEROQUEL) 50 MG tablet Take 1 tablet (50 mg total) by mouth at bedtime. Qty: 30 tablet, Refills: 0      CONTINUE these medications which have NOT CHANGED   Details  aspirin 81 MG EC tablet Take 81 mg by mouth daily.      calcium carbonate (TUMS - DOSED IN MG ELEMENTAL CALCIUM) 500 MG chewable tablet Chew 1 tablet by mouth as needed for heartburn.    diclofenac sodium (VOLTAREN) 1 % GEL Apply 4 g topically 4 (four) times daily. As needed for pain Qty: 100 g, Refills: 5    donepezil (ARICEPT) 5 MG tablet Take 1 tablet (5 mg total) by mouth at bedtime. Qty: 30 tablet, Refills: 11    iron  polysaccharides (FERREX 150) 150 MG capsule Take 150 mg by mouth daily.     metFORMIN (GLUCOPHAGE-XR) 500 MG 24 hr tablet TAKE ONE TABLET BY MOUTH TWICE DAILY Qty: 60 tablet, Refills: 0    metoprolol succinate (TOPROL-XL) 50 MG 24 hr tablet TAKE 1 TABLET (50 MG TOTAL) BY MOUTH DAILY. TAKE WITH OR IMMEDIATELY FOLLOWING A MEAL. Qty: 30 tablet, Refills: 8    omeprazole (PRILOSEC) 40 MG capsule Take 40 mg by mouth daily as needed (heartburn).  Refills: 11    triamcinolone cream (KENALOG) 0.1 % Apply 1 application topically 4 (four) times daily. As needed for rash Qty: 80 g, Refills: 3    nitroGLYCERIN (NITROSTAT) 0.4 MG SL tablet Place 0.4 mg under the tongue every 5 (five) minutes as needed.        STOP taking these medications     loratadine-pseudoephedrine (CLARITIN-D 24-HOUR) 10-240 MG per 24 hr tablet        Allergies  Allergen Reactions  . Penicillins Swelling    REACTION: swelling of joints  . Sulfonamide Derivatives Other (See Comments)    REACTION: "like my head was full of water"  . Albumin (Human) Other (See Comments)    Doesn't remember   . Clindamycin Other (See Comments)    Doesn't remember   . Iodides Hives  . Iodinated Diagnostic Agents Hives  . Sulfa Antibiotics Hives  . Celecoxib Itching and Rash  .  Erythromycin Itching and Rash  . Nitrofuran Derivatives Rash  . Nitrofurantoin Itching and Rash  . Piroxicam Other (See Comments)    unknown  . Povidone-Iodine Itching and Rash   Follow-up Information    Follow up with Renato Shin, MD In 1 week.   Specialty:  Endocrinology   Contact information:   [address] [place] [address] [phone]       Follow up with Cameron Sprang, MD In 2 weeks.   Specialty:  Neurology   Contact information:   [place] [address] [phone]        The results of significant diagnostics from this hospitalization (including imaging, microbiology, ancillary and  laboratory) are listed below for reference.    Significant Diagnostic Studies: Dg Chest 2 View  07/12/2015   CLINICAL DATA:  81 year old female with lethargy, possible TIA. Initial encounter.  EXAM: CHEST  2 VIEW  COMPARISON:  10/31/2014 and earlier.  FINDINGS: Stable lung volumes with moderate elevation of the right hemidiaphragm. Normal cardiac size and mediastinal contours. Chronic calcified atherosclerosis of the aorta. No pneumothorax, pulmonary edema, pleural effusion or confluent pulmonary opacity. No acute osseous abnormality identified. Cholecystectomy clips re- identified.  IMPRESSION: No acute cardiopulmonary abnormality.   Electronically Signed   By: Genevie Ann M.D.   On: 07/12/2015 11:25   Ct Head Wo Contrast  07/14/2015   CLINICAL DATA:  Syncope, poor historian ; history coronary artery disease, hypertension, hyperlipidemia, anemia, type II diabetes mellitus  EXAM: CT HEAD WITHOUT CONTRAST  TECHNIQUE: Contiguous axial images were obtained from the base of the skull through the vertex without intravenous contrast.  COMPARISON:  07/12/2015 CT head and MR brain exams  FINDINGS: Mild atrophy.  Normal ventricular morphology.  No midline shift or mass effect.  Small vessel chronic ischemic changes of deep cerebral white matter.  No intracranial hemorrhage, mass lesion or evidence acute infarction.  No extra-axial fluid collections.  Mucosal thickening throughout ethmoid air cells and sphenoid sinus.  Extensive atherosclerotic calcifications of the carotid siphons bilaterally.  Osseous structures unremarkable.  IMPRESSION: Small vessel chronic ischemic changes of deep cerebral white matter.  No acute intracranial abnormalities.  Mild sinus disease changes.   Electronically Signed   By: Lavonia Dana M.D.   On: 07/14/2015 08:16   Ct Head Wo Contrast  07/12/2015   CLINICAL DATA:  Patient awoke with garbled speech, bloating, confusion. High blood pressure. Subsequent evaluation  EXAM: CT HEAD WITHOUT  CONTRAST  TECHNIQUE: Contiguous axial images were obtained from the base of the skull through the vertex without intravenous contrast.  COMPARISON:  CT 07/11/2015  FINDINGS: No acute intracranial hemorrhage. No focal mass lesion. No CT evidence of acute infarction. No midline shift or mass effect. No hydrocephalus. Basilar cisterns are patent.  Generalized cortical atrophy and mild periventricular white matter disease is unchanged.  Scattered opacification ethmoid air cells and sphenoid sinuses are stable. Frontal sinuses clear. Mastoid air cells are clear.  IMPRESSION: 1. No acute intracranial findings. 2. Mild sinusitis.   Electronically Signed   By: Suzy Bouchard M.D.   On: 07/12/2015 10:22   Ct Head Wo Contrast  07/11/2015   CLINICAL DATA:  Headache behind LEFT eye since yesterday, hypertensive, history type II diabetes mellitus, hypertension, coronary artery disease, GERD  EXAM: CT HEAD WITHOUT CONTRAST  TECHNIQUE: Contiguous axial images were obtained from the base of the skull through the vertex without intravenous contrast.  COMPARISON:  10/31/2014  FINDINGS:  Generalized atrophy.  Stable ventral morphology.  No midline shift or mass effect.  Mild small vessel chronic ischemic changes of deep cerebral white matter.  No intracranial hemorrhage, mass lesion or evidence of acute infarction.  No extra-axial fluid collections.  Mucosal thickening in a few ethmoid air cells.  Visualized paranasal sinuses mastoid air cells otherwise clear.  Atherosclerotic calcifications of the carotid siphons bilaterally.  No acute osseous findings.  IMPRESSION: Atrophy with small vessel chronic ischemic changes of deep cerebral white matter.  No acute intracranial abnormalities.   Electronically Signed   By: Lavonia Dana M.D.   On: 07/11/2015 17:24   Mr Jodene Nam Head Wo Contrast  07/12/2015   CLINICAL DATA:  Confusion and speech difficulty. Recent ER visits for headache and hypertension.  EXAM: MRI HEAD WITHOUT CONTRAST  MRA  HEAD WITHOUT CONTRAST  TECHNIQUE: Multiplanar, multiecho pulse sequences of the brain and surrounding structures were obtained without intravenous contrast. Angiographic images of the head were obtained using MRA technique without contrast.  COMPARISON:  CT head 07/12/2015.  CT head 07/11/2015.  FINDINGS: MRI HEAD FINDINGS  The patient was unable to remain motionless for the exam. Small or subtle lesions could be overlooked.  No evidence for acute infarction, hemorrhage, mass lesion, or extra-axial fluid. Generalized atrophy. Hydrocephalus ex vacuo. Moderately advanced T2 and FLAIR hyperintensity throughout the periventricular and subcortical white matter suggesting small vessel disease.  Flow voids are maintained throughout the carotid, basilar, and vertebral arteries. There are no areas of chronic hemorrhage.  Pituitary, pineal, and cerebellar tonsils unremarkable. No upper cervical lesions.  Extracranial soft tissues show no gross abnormality.  MRA HEAD FINDINGS  The internal carotid arteries are dolichoectatic but widely patent. Slight inferior cavernous outpouching, 2 mm wide necked, consistent with atheromatous disease.  Basilar artery widely patent with vertebrals codominant. Fetal origin RIGHT PCA.  No flow-limiting M1 MCA stenosis. Slight irregularity distal M1 on the LEFT.  Diffusely diseased RIGHT A1 ACA. Both anterior cerebral arteries distally appear sufficiently patent, and supplied by the LEFT anterior cerebral.  Moderately irregular proximal BILATERAL PCA segments and distal M2/M3 middle cerebral artery segments felt to represent intracranial atherosclerotic disease.  IMPRESSION: Motion degraded exam demonstrating atrophy and small vessel disease. No acute stroke is evident.  No proximal flow reducing large vessel stenosis or occlusion. Intracranial atherosclerotic disease is evident.   Electronically Signed   By: Staci Righter M.D.   On: 07/12/2015 13:48   Mr Brain Wo Contrast  07/12/2015    CLINICAL DATA:  Confusion and speech difficulty. Recent ER visits for headache and hypertension.  EXAM: MRI HEAD WITHOUT CONTRAST  MRA HEAD WITHOUT CONTRAST  TECHNIQUE: Multiplanar, multiecho pulse sequences of the brain and surrounding structures were obtained without intravenous contrast. Angiographic images of the head were obtained using MRA technique without contrast.  COMPARISON:  CT head 07/12/2015.  CT head 07/11/2015.  FINDINGS: MRI HEAD FINDINGS  The patient was unable to remain motionless for the exam. Small or subtle lesions could be overlooked.  No evidence for acute infarction, hemorrhage, mass lesion, or extra-axial fluid. Generalized atrophy. Hydrocephalus ex vacuo. Moderately advanced T2 and FLAIR hyperintensity throughout the periventricular and subcortical white matter suggesting small vessel disease.  Flow voids are maintained throughout the carotid, basilar, and vertebral arteries. There are no areas of chronic hemorrhage.  Pituitary, pineal, and cerebellar tonsils unremarkable. No upper cervical lesions.  Extracranial soft tissues show no gross abnormality.  MRA HEAD FINDINGS  The internal carotid arteries are dolichoectatic but widely  patent. Slight inferior cavernous outpouching, 2 mm wide necked, consistent with atheromatous disease.  Basilar artery widely patent with vertebrals codominant. Fetal origin RIGHT PCA.  No flow-limiting M1 MCA stenosis. Slight irregularity distal M1 on the LEFT.  Diffusely diseased RIGHT A1 ACA. Both anterior cerebral arteries distally appear sufficiently patent, and supplied by the LEFT anterior cerebral.  Moderately irregular proximal BILATERAL PCA segments and distal M2/M3 middle cerebral artery segments felt to represent intracranial atherosclerotic disease.  IMPRESSION: Motion degraded exam demonstrating atrophy and small vessel disease. No acute stroke is evident.  No proximal flow reducing large vessel stenosis or occlusion. Intracranial atherosclerotic  disease is evident.   Electronically Signed   By: Staci Righter M.D.   On: 07/12/2015 13:48    Microbiology: No results found for this or any previous visit (from the past 240 hour(s)).   Labs: Basic Metabolic Panel:  Recent Labs Lab 07/11/15 1642 07/12/15 0815 07/14/15 0404  NA 139 135 136  K 3.9 3.8 4.0  CL 105 102 105  CO2 26 26 24   GLUCOSE 135* 206* 116*  BUN 24* 19 20  CREATININE 0.92 1.01* 0.96  CALCIUM 9.3 9.3 9.1   Liver Function Tests: No results for input(s): AST, ALT, ALKPHOS, BILITOT, PROT, ALBUMIN in the last 168 hours. No results for input(s): LIPASE, AMYLASE in the last 168 hours. No results for input(s): AMMONIA in the last 168 hours. CBC:  Recent Labs Lab 07/11/15 1642 07/12/15 0815 07/14/15 0404  WBC 4.3 3.8* 3.9*  HGB 11.4* 10.8* 10.5*  HCT 34.7* 33.5* 32.3*  MCV 89.2 88.2 88.5  PLT 221 199 192   Cardiac Enzymes: No results for input(s): CKTOTAL, CKMB, CKMBINDEX, TROPONINI in the last 168 hours. BNP: BNP (last 3 results) No results for input(s): BNP in the last 8760 hours.  ProBNP (last 3 results) No results for input(s): PROBNP in the last 8760 hours.  CBG:  Recent Labs Lab 07/14/15 0643 07/14/15 1120 07/14/15 1611 07/14/15 2048 07/15/15 0636  GLUCAP 146* 177* 173* 115* 123*       Signed:  Isaura Schiller A  [facility] 07/15/2015, 9:59 AM

## 2015-07-17 ENCOUNTER — Other Ambulatory Visit: Payer: Self-pay | Admitting: Endocrinology

## 2015-07-20 ENCOUNTER — Ambulatory Visit
Admission: RE | Admit: 2015-07-20 | Discharge: 2015-07-20 | Disposition: A | Discharge status: Home / Self Care | Payer: Medicare Other | Source: Ambulatory Visit | Attending: Obstetrics and Gynecology | Admitting: Obstetrics and Gynecology

## 2015-07-20 DIAGNOSIS — Z1231 Encounter for screening mammogram for malignant neoplasm of breast: Secondary | ICD-10-CM

## 2015-07-22 ENCOUNTER — Emergency Department (HOSPITAL_COMMUNITY): Payer: Medicare Other

## 2015-07-22 ENCOUNTER — Encounter (HOSPITAL_COMMUNITY): Payer: Self-pay | Admitting: Emergency Medicine

## 2015-07-22 ENCOUNTER — Emergency Department (HOSPITAL_COMMUNITY)
Admission: EM | Admit: 2015-07-22 | Discharge: 2015-07-22 | Disposition: A | Discharge status: Home / Self Care | Payer: Medicare Other | Attending: Emergency Medicine | Admitting: Emergency Medicine

## 2015-07-22 DIAGNOSIS — R4781 Slurred speech: Secondary | ICD-10-CM

## 2015-07-22 DIAGNOSIS — R29818 Other symptoms and signs involving the nervous system: Secondary | ICD-10-CM | POA: Diagnosis not present

## 2015-07-22 DIAGNOSIS — E785 Hyperlipidemia, unspecified: Secondary | ICD-10-CM | POA: Diagnosis not present

## 2015-07-22 DIAGNOSIS — E119 Type 2 diabetes mellitus without complications: Secondary | ICD-10-CM | POA: Insufficient documentation

## 2015-07-22 DIAGNOSIS — I10 Essential (primary) hypertension: Secondary | ICD-10-CM | POA: Diagnosis not present

## 2015-07-22 DIAGNOSIS — Z86018 Personal history of other benign neoplasm: Secondary | ICD-10-CM | POA: Diagnosis not present

## 2015-07-22 DIAGNOSIS — R40242 Glasgow coma scale score 9-12: Secondary | ICD-10-CM | POA: Diagnosis not present

## 2015-07-22 DIAGNOSIS — Z862 Personal history of diseases of the blood and blood-forming organs and certain disorders involving the immune mechanism: Secondary | ICD-10-CM | POA: Diagnosis not present

## 2015-07-22 DIAGNOSIS — Z7982 Long term (current) use of aspirin: Secondary | ICD-10-CM | POA: Insufficient documentation

## 2015-07-22 DIAGNOSIS — Z79899 Other long term (current) drug therapy: Secondary | ICD-10-CM | POA: Diagnosis not present

## 2015-07-22 DIAGNOSIS — I2510 Atherosclerotic heart disease of native coronary artery without angina pectoris: Secondary | ICD-10-CM | POA: Diagnosis not present

## 2015-07-22 DIAGNOSIS — Z880 Allergy status to penicillin: Secondary | ICD-10-CM | POA: Diagnosis not present

## 2015-07-22 DIAGNOSIS — K219 Gastro-esophageal reflux disease without esophagitis: Secondary | ICD-10-CM | POA: Insufficient documentation

## 2015-07-22 DIAGNOSIS — M1990 Unspecified osteoarthritis, unspecified site: Secondary | ICD-10-CM | POA: Diagnosis not present

## 2015-07-22 DIAGNOSIS — R51 Headache: Secondary | ICD-10-CM | POA: Diagnosis present

## 2015-07-22 LAB — COMPREHENSIVE METABOLIC PANEL
ALT: 15 U/L (ref 14–54)
AST: 21 U/L (ref 15–41)
Albumin: 3.5 g/dL (ref 3.5–5.0)
Alkaline Phosphatase: 68 U/L (ref 38–126)
Anion gap: 9 (ref 5–15)
BUN: 26 mg/dL — ABNORMAL HIGH (ref 6–20)
CO2: 24 mmol/L (ref 22–32)
Calcium: 9.2 mg/dL (ref 8.9–10.3)
Chloride: 103 mmol/L (ref 101–111)
Creatinine, Ser: 1.01 mg/dL — ABNORMAL HIGH (ref 0.44–1.00)
GFR calc Af Amer: 56 mL/min — ABNORMAL LOW (ref >60)
GFR calc non Af Amer: 48 mL/min — ABNORMAL LOW (ref >60)
Glucose, Bld: 180 mg/dL — ABNORMAL HIGH (ref 65–99)
Potassium: 3.7 mmol/L (ref 3.5–5.1)
Sodium: 136 mmol/L (ref 135–145)
Total Bilirubin: 0.6 mg/dL (ref 0.3–1.2)
Total Protein: 6.3 g/dL — ABNORMAL LOW (ref 6.5–8.1)

## 2015-07-22 LAB — I-STAT TROPONIN, ED: Troponin i, poc: 0.01 ng/mL (ref 0.00–0.08)

## 2015-07-22 LAB — I-STAT CHEM 8, ED
BUN: 31 mg/dL — ABNORMAL HIGH (ref 6–20)
Calcium, Ion: 1.24 mmol/L (ref 1.13–1.30)
Chloride: 103 mmol/L (ref 101–111)
Creatinine, Ser: 1 mg/dL (ref 0.44–1.00)
Glucose, Bld: 180 mg/dL — ABNORMAL HIGH (ref 65–99)
HCT: 32 % — ABNORMAL LOW (ref 36.0–46.0)
Hemoglobin: 10.9 g/dL — ABNORMAL LOW (ref 12.0–15.0)
Potassium: 3.7 mmol/L (ref 3.5–5.1)
Sodium: 139 mmol/L (ref 135–145)
TCO2: 21 mmol/L (ref 0–100)

## 2015-07-22 LAB — CBC
HCT: 29.4 % — ABNORMAL LOW (ref 36.0–46.0)
Hemoglobin: 9.5 g/dL — ABNORMAL LOW (ref 12.0–15.0)
MCH: 28.6 pg (ref 26.0–34.0)
MCHC: 32.3 g/dL (ref 30.0–36.0)
MCV: 88.6 fL (ref 78.0–100.0)
Platelets: 210 10*3/uL (ref 150–400)
RBC: 3.32 MIL/uL — ABNORMAL LOW (ref 3.87–5.11)
RDW: 13.6 % (ref 11.5–15.5)
WBC: 4.3 10*3/uL (ref 4.0–10.5)

## 2015-07-22 LAB — DIFFERENTIAL
Basophils Absolute: 0 10*3/uL (ref 0.0–0.1)
Basophils Relative: 0 % (ref 0–1)
Eosinophils Absolute: 0.2 10*3/uL (ref 0.0–0.7)
Eosinophils Relative: 5 % (ref 0–5)
Lymphocytes Relative: 49 % — ABNORMAL HIGH (ref 12–46)
Lymphs Abs: 2.1 10*3/uL (ref 0.7–4.0)
Monocytes Absolute: 0.4 10*3/uL (ref 0.1–1.0)
Monocytes Relative: 9 % (ref 3–12)
Neutro Abs: 1.6 10*3/uL — ABNORMAL LOW (ref 1.7–7.7)
Neutrophils Relative %: 37 % — ABNORMAL LOW (ref 43–77)

## 2015-07-22 LAB — PROTIME-INR
INR: 1.08 (ref 0.00–1.49)
Prothrombin Time: 14.2 seconds (ref 11.6–15.2)

## 2015-07-22 LAB — APTT: aPTT: 33 seconds (ref 24–37)

## 2015-07-22 LAB — CBG MONITORING, ED: Glucose-Capillary: 157 mg/dL — ABNORMAL HIGH (ref 65–99)

## 2015-07-22 NOTE — Discharge Instructions (Signed)
Aphasia Aphasia is a neurological disorder caused by damage to the parts of the brain that control language. CAUSES  Aphasia is not a disease, but a symptom of brain damage. Aphasia is commonly seen in adults who have suffered a stroke. Aphasia also can result from:  A brain tumor.  Infection.  Head injury.  A rare type of dementia called Primary Progressive Aphasia. Common types of dementia may be associated with aphasia but can also exist without language problems. SYMPTOMS  Primary signs of the disorder include:  Problems expressing oneself when speaking.  Trouble understanding speech.  Difficulty with reading and writing.  Speaking in short or incomplete sentences.  Speaking in sentences that don't make sense.  Speaking unrecognizable words.  Interpreting figurative language literally.  Writing sentences that don't make sense. The type and severity of language problems depend on the precise location and extent of the damaged brain tissue. Aphasia can be divided into four broad categories:  Expressive aphasia - difficulty in conveying thoughts through speech or writing. The patient knows what they want to say, but cannot find the words they need.  Receptive aphasia - difficulty understanding spoken or written language. The patient hears the voice or sees the print but cannot make sense of the words.  Anomic or amnesia aphasia - difficulty in using the correct names for particular objects, people, places, or events. This is the least severe form of aphasia.  Global aphasia results from severe and extensive damage to the language areas of the brain. Patients lose almost all language function, both comprehension (understanding) and expression. They cannot speak, understand speech, read, or write. TREATMENT  Sometimes an individual will completely recover from aphasia without treatment. In most cases, language therapy should begin as soon as possible. Language therapy should  be tailored to the individual needs of the patient. Therapy with a speech pathologist involves exercises in which patients:  Read.  Write.  Follow directions.  Repeat what they hear.  Computer-aided therapy may also be used. PROGNOSIS  The outcome of aphasia is difficult to predict. People who are younger or have less extensive brain damage do better. The location of the injury is also important. The location is a clue to prognosis. In general, patients tend to recover skills in language comprehension (understanding) more completely than those skills involving expression (speaking or writing). Document Released: 07/20/2002 Document Revised: 01/20/2012 Document Reviewed: 01/17/2014 [employer] [facility] Patient Information 2015 [place], [place]. This information is not intended to replace advice given to you by your health care provider. Make sure you discuss any questions you have with your health care provider.

## 2015-07-22 NOTE — Code Documentation (Signed)
[name] arrived to [facility] via [facility] for sudden onset AMS and slurred speech.  Per family the pt c/o HA at 20 and got her night-time meds at 1700.  She was found at 1830 to have AMS and slurred speech.  I saw her on 8/31 for similar symptoms that resolved by her arrival to [facility].  She is slower with speech as compared to then but has no difficulty answering questions.  NIH 1 for mild dysarthria.  Her new nigh-time meds in Seroquel 50mg .

## 2015-07-22 NOTE — ED Provider Notes (Signed)
CSN: [id number]     Arrival date & time 07/22/15  1939 History   First MD Initiated Contact with Patient 07/22/15 1939     Chief Complaint  Patient presents with  . Code Stroke     (Consider location/radiation/quality/duration/timing/severity/associated sxs/prior Treatment) Patient is a 81 y.o. female presenting with neurologic complaint.  Neurologic Problem This is a new problem. The current episode started today. The problem occurs constantly. The problem has been unchanged. Associated symptoms include headaches. Pertinent negatives include no abdominal pain, arthralgias, chest pain, chills, congestion, coughing, fever, myalgias, nausea, rash, sore throat or vomiting. Associated symptoms comments: aphasia. Nothing aggravates the symptoms. She has tried nothing for the symptoms.    Past Medical History  Diagnosis Date  . Arrhythmia     paroxysmal atrial fibrillation  . Coronary artery disease   . Hypertension   . Hyperlipidemia   . Edema   . GERD (gastroesophageal reflux disease)   . Anemia B twelve deficiency   . Dizziness - giddy   . Abdominal pain   . Iron deficiency anemia, unspecified   . Pernicious anemia   . Thoracic or lumbosacral neuritis or radiculitis, unspecified   . Symptomatic menopausal or female climacteric states   . Lumbago   . Benign neoplasm of colon   . Allergic rhinitis, cause unspecified   . Type II diabetes mellitus   . Degeneration of lumbar or lumbosacral intervertebral disc   . Arthritis     "arms and hands" (06/30/2013)   Past Surgical History  Procedure Laterality Date  . Back surgery    . Cholecystectomy    . Appendectomy  1974  . Abdominal hysterectomy  1974  . Carpal tunnel release Bilateral 1970's  . Cataract extraction, bilateral Bilateral   . Cardiac catheterization    . Coronary angioplasty with stent placement      "1" (06/30/2013  . Lumbar disc surgery     Family History  Problem Relation Age of Onset  . Diabetes Mother   .  Cancer Sister     "behind heart"  . Cancer Sister    Social History  Substance Use Topics  . Smoking status: Never Smoker   . Smokeless tobacco: Current User    Types: Snuff  . Alcohol Use: No   OB History    No data available     Review of Systems  Constitutional: Negative for fever and chills.  HENT: Negative for congestion and sore throat.   Eyes: Negative for visual disturbance.  Respiratory: Negative for cough, shortness of breath and wheezing.   Cardiovascular: Negative for chest pain.  Gastrointestinal: Negative for nausea, vomiting, abdominal pain, diarrhea and constipation.  Genitourinary: Negative for dysuria, difficulty urinating and vaginal pain.  Musculoskeletal: Negative for myalgias and arthralgias.  Skin: Negative for rash.  Neurological: Positive for speech difficulty and headaches. Negative for syncope.  Psychiatric/Behavioral: Negative for behavioral problems.  All other systems reviewed and are negative.     Allergies  Penicillins; Sulfonamide derivatives; Albumin (human); Clindamycin; Iodides; Iodinated diagnostic agents; Sulfa antibiotics; Celecoxib; Erythromycin; Nitrofuran derivatives; Nitrofurantoin; Piroxicam; and Povidone-iodine  Home Medications   Prior to Admission medications   Medication Sig Start Date End Date Taking? Authorizing Provider  aspirin 81 MG EC tablet Take 81 mg by mouth daily.      Historical Provider, MD  calcium carbonate (TUMS - DOSED IN MG ELEMENTAL CALCIUM) 500 MG chewable tablet Chew 1 tablet by mouth as needed for heartburn.    Historical Provider, MD  diclofenac sodium (VOLTAREN) 1 % GEL Apply 4 g topically 4 (four) times daily. As needed for pain 02/24/15   Renato Shin, MD  donepezil (ARICEPT) 5 MG tablet Take 1 tablet (5 mg total) by mouth at bedtime. 03/27/15   Renato Shin, MD  FERREX 150 150 MG capsule TAKE 1 CAPSULE (150 MG TOTAL) BY MOUTH 2 (TWO) TIMES DAILY. 07/18/15   Renato Shin, MD  iron polysaccharides (FERREX  150) 150 MG capsule Take 150 mg by mouth daily.     Historical Provider, MD  metFORMIN (GLUCOPHAGE-XR) 500 MG 24 hr tablet TAKE ONE TABLET BY MOUTH TWICE DAILY 07/18/15   Renato Shin, MD  metoprolol succinate (TOPROL-XL) 50 MG 24 hr tablet TAKE 1 TABLET (50 MG TOTAL) BY MOUTH DAILY. TAKE WITH OR IMMEDIATELY FOLLOWING A MEAL. 06/02/15   Deboraha Sprang, MD  nitroGLYCERIN (NITROSTAT) 0.4 MG SL tablet Place 0.4 mg under the tongue every 5 (five) minutes as needed.      Historical Provider, MD  omeprazole (PRILOSEC) 40 MG capsule Take 40 mg by mouth daily as needed (heartburn).  05/22/15   Historical Provider, MD  QUEtiapine (SEROQUEL) 50 MG tablet Take 1 tablet (50 mg total) by mouth at bedtime. 07/15/15   Verlee Monte, MD  triamcinolone cream (KENALOG) 0.1 % Apply 1 application topically 4 (four) times daily. As needed for rash 03/27/15   Renato Shin, MD   BP 149/69 mmHg  Pulse 84  Temp(Src) 97.5 F (36.4 C) (Oral)  Resp 13  Ht 5\' 5"  (1.651 m)  Wt 121 lb (54.885 kg)  BMI 20.14 kg/m2  SpO2 99% Physical Exam  Constitutional: She is oriented to person, place, and time. She appears well-developed and well-nourished. No distress.  HENT:  Head: Normocephalic and atraumatic.  Eyes: EOM are normal.  Neck: Normal range of motion.  Cardiovascular: Normal rate, regular rhythm and normal heart sounds.   No murmur heard. Pulmonary/Chest: Effort normal and breath sounds normal. No respiratory distress. She has no wheezes.  Abdominal: Soft. There is no tenderness.  Musculoskeletal: She exhibits no edema.  Neurological: She is alert and oriented to person, place, and time. She has normal strength. No cranial nerve deficit or sensory deficit. Coordination normal. GCS eye subscore is 4. GCS verbal subscore is 5. GCS motor subscore is 6.  Skin: She is not diaphoretic.  Psychiatric: She has a normal mood and affect. Her behavior is normal. Her speech is slurred.    ED Course  Procedures (including critical  care time) Labs Review Labs Reviewed  CBC - Abnormal; Notable for the following:    RBC 3.32 (*)    Hemoglobin 9.5 (*)    HCT 29.4 (*)    All other components within normal limits  DIFFERENTIAL - Abnormal; Notable for the following:    Neutrophils Relative % 37 (*)    Neutro Abs 1.6 (*)    Lymphocytes Relative 49 (*)    All other components within normal limits  COMPREHENSIVE METABOLIC PANEL - Abnormal; Notable for the following:    Glucose, Bld 180 (*)    BUN 26 (*)    Creatinine, Ser 1.01 (*)    Total Protein 6.3 (*)    GFR calc non Af Amer 48 (*)    GFR calc Af Amer 56 (*)    All other components within normal limits  CBG MONITORING, ED - Abnormal; Notable for the following:    Glucose-Capillary 157 (*)    All other components within normal limits  I-STAT CHEM 8, ED - Abnormal; Notable for the following:    BUN 31 (*)    Glucose, Bld 180 (*)    Hemoglobin 10.9 (*)    HCT 32.0 (*)    All other components within normal limits  PROTIME-INR  APTT  I-STAT TROPOININ, ED    Imaging Review Ct Head Wo Contrast  07/22/2015   CLINICAL DATA:  81 year old female code stroke.  EXAM: CT HEAD WITHOUT CONTRAST  TECHNIQUE: Contiguous axial images were obtained from the base of the skull through the vertex without intravenous contrast.  COMPARISON:  CT dated 07/14/2015  FINDINGS: There is stable prominence of the ventricles and sulci compatible with age-related atrophy. Periventricular and deep white matter hypodensities represent chronic microvascular ischemic changes. There is no intracranial hemorrhage. No mass effect or midline shift identified.  There is mild mucoperiosteal thickening of the paranasal sinuses with partial opacification of the ethmoid air cells. The mastoid air cells are clear. Linear lucency in the posterior occipital calvarium is similar to prior study. The calvarium is intact.  IMPRESSION: No acute intracranial pathology.  Age-related atrophy and chronic microvascular  ischemic disease.  If symptoms persist and there are no contraindications, MRI may provide better evaluation if clinically indicated.  These results were called by telephone at the time of interpretation on 07/22/2015 at 8:00 pm to Dr. Robina Ade, who verbally acknowledged these results.   Electronically Signed   By: Anner Crete M.D.   On: 07/22/2015 20:04   I have personally reviewed and evaluated these images and lab results as part of my medical decision-making.   EKG Interpretation None      MDM   Final diagnoses:  Slurred speech     Patient is an 81 year old female with a history diabetes, hypertension, dyslipidemia, CAD,dementia, paroxysmal atrial fibrillation, osteoarthritis as well as allergic rhinitis with recurrent headache who presents to the ER with complaints of aphasia. Patient presented this way 10 days ago and had full TA/stroke workup was unremarkable with a MRI that showed vascular disease however no acute stroke. On arrival to the ED the patient still had slurred speech and was taken immediately to CT scan as a code stroke. Patient has no other focal neurologic deficits. Patient is complaining of headache at this time.  Neurology evaluated the patient and given recent starting of Seroquel and negative workup it is felt this is likely secondary to medication and is safe for discharge at this time with PCP and neurology follow-up. Family is aware of plan and in agreement.  Renne Musca, MD 07/22/15 2130  Leonard Schwartz, MD 07/30/15 [phone]

## 2015-07-22 NOTE — Consult Note (Signed)
Reason for Consult:Lethargy, slurred speech Referring Physician: Audie Pinto  CC: Lethargy, slurred speech  HPI: [name] is an 81 y.o. female who was recently discharged on 9/3 after being admitted for evaluation of aphasia/slurred speech and possible seizures.  Stroke work up was unremarkable.  Patient was started on an aspirin a day.  EEG was unremarkable and it was determined that the patient would not start on anticonvulsive therapy but be followed on an outpatient basis.  While hospitalized the patient was started on Seroquel 50mg  due to severe agitation.  This has been continued at home.  Patient has not had any agitation at home.    Past Medical History  Diagnosis Date  . Arrhythmia     paroxysmal atrial fibrillation  . Coronary artery disease   . Hypertension   . Hyperlipidemia   . Edema   . GERD (gastroesophageal reflux disease)   . Anemia B twelve deficiency   . Dizziness - giddy   . Abdominal pain   . Iron deficiency anemia, unspecified   . Pernicious anemia   . Thoracic or lumbosacral neuritis or radiculitis, unspecified   . Symptomatic menopausal or female climacteric states   . Lumbago   . Benign neoplasm of colon   . Allergic rhinitis, cause unspecified   . Type II diabetes mellitus   . Degeneration of lumbar or lumbosacral intervertebral disc   . Arthritis     "arms and hands" (06/30/2013)    Past Surgical History  Procedure Laterality Date  . Back surgery    . Cholecystectomy    . Appendectomy  1974  . Abdominal hysterectomy  1974  . Carpal tunnel release Bilateral 1970's  . Cataract extraction, bilateral Bilateral   . Cardiac catheterization    . Coronary angioplasty with stent placement      "1" (06/30/2013  . Lumbar disc surgery      Family History  Problem Relation Age of Onset  . Diabetes Mother   . Cancer Sister     "behind heart"  . Cancer Sister     Social History:  reports that she has never smoked. Her smokeless tobacco use includes  Snuff. She reports that she does not drink alcohol or use illicit drugs.  Allergies  Allergen Reactions  . Penicillins Swelling    REACTION: swelling of joints  . Sulfonamide Derivatives Other (See Comments)    REACTION: "like my head was full of water"  . Albumin (Human) Other (See Comments)    Doesn't remember   . Clindamycin Other (See Comments)    Doesn't remember   . Iodides Hives  . Iodinated Diagnostic Agents Hives  . Sulfa Antibiotics Hives  . Celecoxib Itching and Rash  . Erythromycin Itching and Rash  . Nitrofuran Derivatives Rash  . Nitrofurantoin Itching and Rash  . Piroxicam Other (See Comments)    unknown  . Povidone-Iodine Itching and Rash    Medications: I have reviewed the patient's current medications. Prior to Admission:  Prior to Admission medications   Medication Sig Start Date End Date Taking? Authorizing Provider  aspirin 81 MG EC tablet Take 81 mg by mouth daily.      Historical Provider, MD  calcium carbonate (TUMS - DOSED IN MG ELEMENTAL CALCIUM) 500 MG chewable tablet Chew 1 tablet by mouth as needed for heartburn.    Historical Provider, MD  diclofenac sodium (VOLTAREN) 1 % GEL Apply 4 g topically 4 (four) times daily. As needed for pain 02/24/15   Renato Shin, MD  donepezil (ARICEPT) 5 MG tablet Take 1 tablet (5 mg total) by mouth at bedtime. 03/27/15   Renato Shin, MD  FERREX 150 150 MG capsule TAKE 1 CAPSULE (150 MG TOTAL) BY MOUTH 2 (TWO) TIMES DAILY. 07/18/15   Renato Shin, MD  iron polysaccharides (FERREX 150) 150 MG capsule Take 150 mg by mouth daily.     Historical Provider, MD  metFORMIN (GLUCOPHAGE-XR) 500 MG 24 hr tablet TAKE ONE TABLET BY MOUTH TWICE DAILY 07/18/15   Renato Shin, MD  metoprolol succinate (TOPROL-XL) 50 MG 24 hr tablet TAKE 1 TABLET (50 MG TOTAL) BY MOUTH DAILY. TAKE WITH OR IMMEDIATELY FOLLOWING A MEAL. 06/02/15   Deboraha Sprang, MD  nitroGLYCERIN (NITROSTAT) 0.4 MG SL tablet Place 0.4 mg under the tongue every 5 (five) minutes  as needed.      Historical Provider, MD  omeprazole (PRILOSEC) 40 MG capsule Take 40 mg by mouth daily as needed (heartburn).  05/22/15   Historical Provider, MD  QUEtiapine (SEROQUEL) 50 MG tablet Take 1 tablet (50 mg total) by mouth at bedtime. 07/15/15   Verlee Monte, MD  triamcinolone cream (KENALOG) 0.1 % Apply 1 application topically 4 (four) times daily. As needed for rash 03/27/15   Renato Shin, MD    ROS: History obtained from the patient  General ROS: negative for - chills, fatigue, fever, night sweats, weight gain or weight loss Psychological ROS: negative for - behavioral disorder, hallucinations, memory difficulties, mood swings or suicidal ideation Ophthalmic ROS: negative for - blurry vision, double vision, eye pain or loss of vision ENT ROS: negative for - epistaxis, nasal discharge, oral lesions, sore throat, tinnitus or vertigo Allergy and Immunology ROS: negative for - hives or itchy/watery eyes Hematological and Lymphatic ROS: negative for - bleeding problems, bruising or swollen lymph nodes Endocrine ROS: negative for - galactorrhea, hair pattern changes, polydipsia/polyuria or temperature intolerance Respiratory ROS: negative for - cough, hemoptysis, shortness of breath or wheezing Cardiovascular ROS: negative for - chest pain, dyspnea on exertion, edema or irregular heartbeat Gastrointestinal ROS: negative for - abdominal pain, diarrhea, hematemesis, nausea/vomiting or stool incontinence Genito-Urinary ROS: negative for - dysuria, hematuria, incontinence or urinary frequency/urgency Musculoskeletal ROS: negative for - joint swelling or muscular weakness Neurological ROS: as noted in HPI, intermittent headache Dermatological ROS: negative for rash and skin lesion changes  Physical Examination: Blood pressure 184/67, pulse 87, temperature 97.5 F (36.4 C), temperature source Oral, resp. rate 14, height 5\' 5"  (1.651 m), weight 54.885 kg (121 lb), SpO2 100 %.  HEENT-   Normocephalic, no lesions, without obvious abnormality.  Normal external eye and conjunctiva.  Normal TM's bilaterally.  Normal auditory canals and external ears. Normal external nose, mucus membranes and septum.  Normal pharynx. Cardiovascular- S1, S2 normal, pulses palpable throughout   Lungs- chest clear, no wheezing, rales, normal symmetric air entry Abdomen- soft, non-tender; bowel sounds normal; no masses,  no organomegaly Extremities- no edema Lymph-no adenopathy palpable Musculoskeletal-no joint tenderness, deformity or swelling Skin-warm and dry, no hyperpigmentation, vitiligo, or suspicious lesions  Neurological Examination Mental Status: Alert, oriented, thought content appropriate.  Speech fluent without evidence of aphasia.  Able to follow 3 step commands without difficulty. Cranial Nerves: II: Discs flat bilaterally; Visual fields grossly normal, pupils equal, round, reactive to light and accommodation III,IV, VI: ptosis not present, extra-ocular motions intact bilaterally V,VII: smile symmetric, facial light touch sensation normal bilaterally VIII: hearing normal bilaterally IX,X: gag reflex present XI: bilateral shoulder shrug XII: midline tongue extension Motor: Right :  Upper extremity   5/5    Left:     Upper extremity   5/5  Lower extremity   5/5     Lower extremity   5/5 Tone and bulk:normal tone throughout; no atrophy noted Sensory: Pinprick and light touch intact throughout, bilaterally Deep Tendon Reflexes: 1+ and symmetric throughout Plantars: Right: downgoing   Left: downgoing Cerebellar: normal finger-to-nose and normal heel-to-shin testing bilaterally   Laboratory Studies:   Basic Metabolic Panel:  Recent Labs Lab 07/22/15 1944 07/22/15 1950  NA 136 139  K 3.7 3.7  CL 103 103  CO2 24  --   GLUCOSE 180* 180*  BUN 26* 31*  CREATININE 1.01* 1.00  CALCIUM 9.2  --     Liver Function Tests:  Recent Labs Lab 07/22/15 1944  AST 21  ALT 15   ALKPHOS 68  BILITOT 0.6  PROT 6.3*  ALBUMIN 3.5   No results for input(s): LIPASE, AMYLASE in the last 168 hours. No results for input(s): AMMONIA in the last 168 hours.  CBC:  Recent Labs Lab 07/22/15 1944 07/22/15 1950  WBC 4.3  --   NEUTROABS 1.6*  --   HGB 9.5* 10.9*  HCT 29.4* 32.0*  MCV 88.6  --   PLT 210  --     Cardiac Enzymes: No results for input(s): CKTOTAL, CKMB, CKMBINDEX, TROPONINI in the last 168 hours.  BNP: Invalid input(s): POCBNP  CBG:  Recent Labs Lab 07/22/15 2003  GLUCAP 157*    Microbiology: Results for orders placed or performed during the hospital encounter of 10/31/14  Urine culture     Status: None   Collection Time: 10/31/14  7:59 PM  Result Value Ref Range Status   Specimen Description URINE, CLEAN CATCH  Final   Special Requests Normal  Final   Culture  Setup Time   Final    11/01/2014 04:18 Performed at [facility] Performed at [facility]   Final   Culture NO GROWTH Performed at [facility]   Final   Report Status 11/02/2014 FINAL  Final    Coagulation Studies:  Recent Labs  07/22/15 1944  LABPROT 14.2  INR 1.08    Urinalysis: No results for input(s): COLORURINE, LABSPEC, PHURINE, GLUCOSEU, HGBUR, BILIRUBINUR, KETONESUR, PROTEINUR, UROBILINOGEN, NITRITE, LEUKOCYTESUR in the last 168 hours.  Invalid input(s): APPERANCEUR  Lipid Panel:     Component Value Date/Time   CHOL 230* 07/13/2015 0422   TRIG 122 07/13/2015 0422   TRIG 80 09/04/2006 0837   HDL 56 07/13/2015 0422   CHOLHDL 4.1 07/13/2015 0422   CHOLHDL 3.9 CALC 09/04/2006 0837   VLDL 24 07/13/2015 0422   LDLCALC 150* 07/13/2015 0422    HgbA1C:  Lab Results  Component Value Date   HGBA1C 7.9* 07/13/2015    Urine Drug Screen:  No results found for: LABOPIA, COCAINSCRNUR, LABBENZ, AMPHETMU, THCU, LABBARB  Alcohol Level: No results for input(s): ETH in the last 168 hours.  Other  results: EKG: sinus rhythm at 84 bpm.  Imaging: Ct Head Wo Contrast  07/22/2015   CLINICAL DATA:  81 year old female code stroke.  EXAM: CT HEAD WITHOUT CONTRAST  TECHNIQUE: Contiguous axial images were obtained from the base of the skull through the vertex without intravenous contrast.  COMPARISON:  CT dated 07/14/2015  FINDINGS: There is stable prominence of the ventricles and sulci compatible with age-related atrophy. Periventricular and deep white matter hypodensities represent chronic microvascular ischemic changes. There is  no intracranial hemorrhage. No mass effect or midline shift identified.  There is mild mucoperiosteal thickening of the paranasal sinuses with partial opacification of the ethmoid air cells. The mastoid air cells are clear. Linear lucency in the posterior occipital calvarium is similar to prior study. The calvarium is intact.  IMPRESSION: No acute intracranial pathology.  Age-related atrophy and chronic microvascular ischemic disease.  If symptoms persist and there are no contraindications, MRI may provide better evaluation if clinically indicated.  These results were called by telephone at the time of interpretation on 07/22/2015 at 8:00 pm to Dr. Robina Ade, who verbally acknowledged these results.   Electronically Signed   By: Anner Crete M.D.   On: 07/22/2015 20:04     Assessment/Plan: 81 year old female presenting with slurred speech and lethargy.  Admitted recently with speech difficulties that resolved.  Stroke work up at that time was unremarkable.  Patient on ASA.  Patient now with recurrent slurred speech and lethargy.  Questionably related to newly started Seroquel.  Other than speech deficits no other neurologic abnormalities noted on examination.  Head CT personally reviewed and shows no acute changes.    Recommendations: 1.  D/C Seroquel 2.  Keep scheduled follow up with neurology.  No further stroke work up indicated at this time.     Case discussed with Dr.  Donavan Burnet, MD [facility] [phone] 07/22/2015, 8:36 PM

## 2015-07-22 NOTE — ED Notes (Signed)
Pt in CT.

## 2015-07-22 NOTE — ED Notes (Signed)
Pt started having slurred speech and became altered per family around 34. In route to ED at [facility] pt became more alert and quicker to answer questions. Pt is alert and ox4. Pt seems drowsy and slow to respond but able to follow commands. Pt does have mild slurred speech. No other neuro deficits noted at this time.

## 2015-07-26 ENCOUNTER — Ambulatory Visit (INDEPENDENT_AMBULATORY_CARE_PROVIDER_SITE_OTHER): Payer: Medicare Other | Admitting: Endocrinology

## 2015-07-26 ENCOUNTER — Encounter: Payer: Self-pay | Admitting: Endocrinology

## 2015-07-26 VITALS — BP 136/74 | HR 74 | Temp 97.6°F | Ht 62.0 in | Wt 113.0 lb

## 2015-07-26 DIAGNOSIS — E119 Type 2 diabetes mellitus without complications: Secondary | ICD-10-CM | POA: Diagnosis not present

## 2015-07-26 DIAGNOSIS — I2510 Atherosclerotic heart disease of native coronary artery without angina pectoris: Secondary | ICD-10-CM

## 2015-07-26 DIAGNOSIS — I10 Essential (primary) hypertension: Secondary | ICD-10-CM

## 2015-07-26 DIAGNOSIS — D509 Iron deficiency anemia, unspecified: Secondary | ICD-10-CM

## 2015-07-26 NOTE — Patient Instructions (Addendum)
Please let us know if you want to see a dietician specialist. Please continue the same medications for blood pressure and diabetes Please continue the iron, although it does not completely explain the anemia. In terms of the memory pill, I'll go along with whatever Dr Delice Lesch says.   Please come back for a follow-up appointment in 4 months.

## 2015-07-26 NOTE — Progress Notes (Signed)
Subjective:    Patient ID: [name], female    DOB: 07-02-1925, 81 y.o.   MRN: [id number]  HPI The state of at least three ongoing medical problems is addressed today, with interval history of each noted here: AMS: since hosp d/c, she has had no further episodes Anemia: she denies BRBPR HTN: she denies sob DM: she has lost a few more lbs.  Past Medical History  Diagnosis Date  . Arrhythmia     paroxysmal atrial fibrillation  . Coronary artery disease   . Hypertension   . Hyperlipidemia   . Edema   . GERD (gastroesophageal reflux disease)   . Anemia B twelve deficiency   . Dizziness - giddy   . Abdominal pain   . Iron deficiency anemia, unspecified   . Pernicious anemia   . Thoracic or lumbosacral neuritis or radiculitis, unspecified   . Symptomatic menopausal or female climacteric states   . Lumbago   . Benign neoplasm of colon   . Allergic rhinitis, cause unspecified   . Type II diabetes mellitus   . Degeneration of lumbar or lumbosacral intervertebral disc   . Arthritis     "arms and hands" (06/30/2013)    Past Surgical History  Procedure Laterality Date  . Back surgery    . Cholecystectomy    . Appendectomy  1974  . Abdominal hysterectomy  1974  . Carpal tunnel release Bilateral 1970's  . Cataract extraction, bilateral Bilateral   . Cardiac catheterization    . Coronary angioplasty with stent placement      "1" (06/30/2013  . Lumbar disc surgery      Social History   Social History  . Marital Status: Widowed    Spouse Name: N/A  . Number of Children: N/A  . Years of Education: N/A   Occupational History  . Not on file.   Social History Main Topics  . Smoking status: Never Smoker   . Smokeless tobacco: Current User    Types: Snuff  . Alcohol Use: No  . Drug Use: No  . Sexual Activity: No   Other Topics Concern  . Not on file   Social History Narrative    Current Outpatient Prescriptions on File Prior to Visit  Medication Sig  Dispense Refill  . aspirin 81 MG EC tablet Take 81 mg by mouth daily.      . calcium carbonate (TUMS - DOSED IN MG ELEMENTAL CALCIUM) 500 MG chewable tablet Chew 1 tablet by mouth as needed for heartburn.    . diclofenac sodium (VOLTAREN) 1 % GEL Apply 4 g topically 4 (four) times daily. As needed for pain 100 g 5  . donepezil (ARICEPT) 5 MG tablet Take 1 tablet (5 mg total) by mouth at bedtime. 30 tablet 11  . FERREX 150 150 MG capsule TAKE 1 CAPSULE (150 MG TOTAL) BY MOUTH 2 (TWO) TIMES DAILY. 60 capsule 1  . iron polysaccharides (FERREX 150) 150 MG capsule Take 150 mg by mouth daily.     . metFORMIN (GLUCOPHAGE-XR) 500 MG 24 hr tablet TAKE ONE TABLET BY MOUTH TWICE DAILY 60 tablet 0  . metoprolol succinate (TOPROL-XL) 50 MG 24 hr tablet TAKE 1 TABLET (50 MG TOTAL) BY MOUTH DAILY. TAKE WITH OR IMMEDIATELY FOLLOWING A MEAL. 30 tablet 8  . nitroGLYCERIN (NITROSTAT) 0.4 MG SL tablet Place 0.4 mg under the tongue every 5 (five) minutes as needed.      [name] omeprazole (PRILOSEC) 40 MG capsule Take 40 mg by mouth  daily as needed (heartburn).   11  . triamcinolone cream (KENALOG) 0.1 % Apply 1 application topically 4 (four) times daily. As needed for rash 80 g 3   No current facility-administered medications on file prior to visit.    Allergies  Allergen Reactions  . Penicillins Swelling    REACTION: swelling of joints  . Sulfonamide Derivatives Other (See Comments)    REACTION: "like my head was full of water"  . Albumin (Human) Other (See Comments)    Doesn't remember   . Clindamycin Other (See Comments)    Doesn't remember   . Iodides Hives  . Iodinated Diagnostic Agents Hives  . Sulfa Antibiotics Hives  . Celecoxib Itching and Rash  . Erythromycin Itching and Rash  . Nitrofuran Derivatives Rash  . Nitrofurantoin Itching and Rash  . Piroxicam Other (See Comments)    unknown  . Povidone-Iodine Itching and Rash    Family History  Problem Relation Age of Onset  . Diabetes Mother   .  Cancer Sister     "behind heart"  . Cancer Sister     BP 136/74 mmHg  Pulse 74  Temp(Src) 97.6 F (36.4 C) (Oral)  Ht 5\' 2"  (1.575 m)  Wt 113 lb (51.256 kg)  BMI 20.66 kg/m2  SpO2 98%  Review of Systems Denies hematuria and falls.      Objective:   Physical Exam VITAL SIGNS:  See vs page GENERAL: no distress Gait: slow but steady.     Lab Results  Component Value Date   HGBA1C 7.9* 07/13/2015   Lab Results  Component Value Date   WBC 4.3 07/22/2015   HGB 10.9* 07/22/2015   HCT 32.0* 07/22/2015   MCV 88.6 07/22/2015   PLT 210 07/22/2015   Lab Results  Component Value Date   IRON 64 03/27/2015   TIBC 267 11/14/2014   FERRITIN 309.9* 09/20/2010      Assessment & Plan:  AMS, improved, but cause is still uncertain to me.  It may be part of the natural hx of her dementia. Anemia, stable HTN: well-controlled DM: adequate control for this clinical situation.    Patient is advised the following: Patient Instructions  Please let us know if you want to see a dietician specialist. Please continue the same medications for blood pressure and diabetes Please continue the iron, although it does not completely explain the anemia. In terms of the memory pill, I'll go along with whatever Dr Delice Lesch says.   Please come back for a follow-up appointment in 4 months.

## 2015-08-02 ENCOUNTER — Ambulatory Visit (INDEPENDENT_AMBULATORY_CARE_PROVIDER_SITE_OTHER): Payer: Medicare Other | Admitting: Neurology

## 2015-08-02 ENCOUNTER — Encounter: Payer: Self-pay | Admitting: Neurology

## 2015-08-02 VITALS — BP 142/78 | HR 77 | Temp 98.0°F | Resp 16 | Ht 62.0 in | Wt 114.1 lb

## 2015-08-02 DIAGNOSIS — I2510 Atherosclerotic heart disease of native coronary artery without angina pectoris: Secondary | ICD-10-CM

## 2015-08-02 DIAGNOSIS — R404 Transient alteration of awareness: Secondary | ICD-10-CM | POA: Diagnosis not present

## 2015-08-02 NOTE — Patient Instructions (Addendum)
1. Schedule 24-hour EEG 2. Follow-up after EEG  Seizure Precautions: 1. If medication has been prescribed for you to prevent seizures, take it exactly as directed.  Do not stop taking the medicine without talking to your doctor first, even if you have not had a seizure in a long time.   2. Avoid activities in which a seizure would cause danger to yourself or to others.  Don't operate dangerous machinery, swim alone, or climb in high or dangerous places, such as on ladders, roofs, or girders.  Do not drive unless your doctor says you may.  3. If you have any warning that you may have a seizure, lay down in a safe place where you can't hurt yourself.    4.  No driving for 6 months from last seizure, as per [facility].   Please refer to the following link on the [place] website for more information: http://[email or web address]/[email or web address]/[email or web address]   5.  Maintain good sleep hygiene.  6.  Contact your doctor if you have any problems that may be related to the medicine you are taking.  7.  Call 911 and bring the patient back to the ED if:        A.  The seizure lasts longer than 5 minutes.       B.  The patient doesn't awaken shortly after the seizure  C.  The patient has new problems such as difficulty seeing, speaking or moving  D.  The patient was injured during the seizure  E.  The patient has a temperature over 102 F (39C)  F.  The patient vomited and now is having trouble breathing

## 2015-08-02 NOTE — Progress Notes (Signed)
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 Renato Shin Primary care provider: Dr. Renato Shin  Reason for consult:  Seizure, TIA  Dear Dr Loanne Drilling:  Thank you for your kind referral of [name] for consultation of the above symptoms. Although her history is well known to you, please allow me to reiterate it for the purpose of our medical record. The patient was accompanied to the clinic by her 2 daughters who also provide collateral information. Records and images were personally reviewed where available.  HISTORY OF PRESENT ILLNESS: This is a pleasant 81 year old right-handed woman with a history of diabetes, hypertension, CAD, paroxysmal atrial fibrillation, dementia, presenting for recurrent episodes of loss of consciousness and headaches. She was first brought to the ER on 07/11/15 for severe retro-orbital headaches, BP was noted to be 195/79. She was discharged home with improvement in headache and BP. She was brought back by family the next day for aphasia lasting 30-35 minutes. She was attempting to eat breakfast when she started "babbling" and appeared more confused. The cup she was holding was shaking and beginning to spill. This lasted a few minutes, and as she was improving, she said "why am I talking like this?" No focal weakness noted by family, she was arguing about what she wanted to wear when EMS arrived. She has no recollection of this. Per records, symptoms resolved upon EMS arrival. She was admitted for stroke workup. CBC, BMP were unremarkable except for glucose of 206. I personally reviewed MRI brain without contrast which did not show acute infarct, there was generalized atrophy and hydrocephalus ex vacuo, moderately advanced chronic microvascular disease. MRA showed intracranial atherosclerosis, no significant stenosis. Echo showed EF of 84-66%, grade 1 diastolic dysfunction, normal left atrium. On her third  hospital day, she had an episode while sitting on a chair, when her whole body became stiff and she was unresponsive for 1-2 minutes. She was lowered to the ground and revived within a couple of sternal rubs. Vital signs were normal. Head CT was unremarkable. On further questioning, she has had previous episodes of transient loss of consciousness where she would suddenly collapse with loss of bladder control. Her wake and drowsy EEG was normal. After conversation with family, it was agreed to hold off on starting seizure medication and continue clinical observation for now. She was very confused during her hospital stay, "she tried to escape," and was started on Seroquel. She was discharged home back to baseline per family. She was brought back to the ER on 07/22/15 for slurred speech and lethargy, felt to be due to Seroquel, this has since been discontinued with no further similar episodes.   Her daughter reports that the episodes of passing out started 5-6 years ago, she has had a total of 5 or 6. Once or twice, her daughter has seen her stare, then go out. It appeared she would just go to sleep. She had urinary incontinence at least twice with these. No convulsive activity noted. Episodes would last 5-10 minutes, then she would come to a little confused. Prior to recent events, the last passing out episode was in January 2015. She denies any further headaches, dizziness, diplopia, dysarthria, dysphagia, focal numbness/tingling/weakness, neck pain, bowel/bladder dysfunction. She denies any falls. She feels her memory is pretty good. She lives with her daughter. Family started to notice memory changes several years ago, they administer her medications. They deny any hallucinations. She does not drive. She is  taking Aricept 5mg  daily with no side effects. She denies any olfactory/gustatory hallucinations, deja vu, rising epigastric sensation, focal numbness/tingling/weakness, myoclonic jerks. There is a family  history of seizures in her older sister, cousin, and grandson. Otherwise, she had a normal birth and early development.  There is no history of febrile convulsions, CNS infections such as meningitis/encephalitis, significant traumatic brain injury, neurosurgical procedures,  PAST MEDICAL HISTORY: Past Medical History  Diagnosis Date  . Arrhythmia     paroxysmal atrial fibrillation  . Coronary artery disease   . Hypertension   . Hyperlipidemia   . Edema   . GERD (gastroesophageal reflux disease)   . Anemia B twelve deficiency   . Dizziness - giddy   . Abdominal pain   . Iron deficiency anemia, unspecified   . Pernicious anemia   . Thoracic or lumbosacral neuritis or radiculitis, unspecified   . Symptomatic menopausal or female climacteric states   . Lumbago   . Benign neoplasm of colon   . Allergic rhinitis, cause unspecified   . Type II diabetes mellitus   . Degeneration of lumbar or lumbosacral intervertebral disc   . Arthritis     "arms and hands" (06/30/2013)    PAST SURGICAL HISTORY: Past Surgical History  Procedure Laterality Date  . Back surgery    . Cholecystectomy    . Appendectomy  1974  . Abdominal hysterectomy  1974  . Carpal tunnel release Bilateral 1970's  . Cataract extraction, bilateral Bilateral   . Cardiac catheterization    . Coronary angioplasty with stent placement      "1" (06/30/2013  . Lumbar disc surgery      MEDICATIONS: Current Outpatient Prescriptions on File Prior to Visit  Medication Sig Dispense Refill  . aspirin 81 MG EC tablet Take 81 mg by mouth daily.      . calcium carbonate (TUMS - DOSED IN MG ELEMENTAL CALCIUM) 500 MG chewable tablet Chew 1 tablet by mouth as needed for heartburn.    . diclofenac sodium (VOLTAREN) 1 % GEL Apply 4 g topically 4 (four) times daily. As needed for pain 100 g 5  . donepezil (ARICEPT) 5 MG tablet Take 1 tablet (5 mg total) by mouth at bedtime. 30 tablet 11  . FERREX 150 150 MG capsule TAKE 1 CAPSULE (150  MG TOTAL) BY MOUTH 2 (TWO) TIMES DAILY. 60 capsule 1  . iron polysaccharides (FERREX 150) 150 MG capsule Take 150 mg by mouth daily.     . metFORMIN (GLUCOPHAGE-XR) 500 MG 24 hr tablet TAKE ONE TABLET BY MOUTH TWICE DAILY 60 tablet 0  . metoprolol succinate (TOPROL-XL) 50 MG 24 hr tablet TAKE 1 TABLET (50 MG TOTAL) BY MOUTH DAILY. TAKE WITH OR IMMEDIATELY FOLLOWING A MEAL. 30 tablet 8  . nitroGLYCERIN (NITROSTAT) 0.4 MG SL tablet Place 0.4 mg under the tongue every 5 (five) minutes as needed.      [name] omeprazole (PRILOSEC) 40 MG capsule Take 40 mg by mouth daily as needed (heartburn).   11  . triamcinolone cream (KENALOG) 0.1 % Apply 1 application topically 4 (four) times daily. As needed for rash 80 g 3   No current facility-administered medications on file prior to visit.    ALLERGIES: Allergies  Allergen Reactions  . Penicillins Swelling    REACTION: swelling of joints  . Sulfonamide Derivatives Other (See Comments)    REACTION: "like my head was full of water"  . Albumin (Human) Other (See Comments)    Doesn't remember   .  Clindamycin Other (See Comments)    Doesn't remember   . Iodides Hives  . Iodinated Diagnostic Agents Hives  . Sulfa Antibiotics Hives  . Celecoxib Itching and Rash  . Erythromycin Itching and Rash  . Nitrofuran Derivatives Rash  . Nitrofurantoin Itching and Rash  . Piroxicam Other (See Comments)    unknown  . Povidone-Iodine Itching and Rash    FAMILY HISTORY: Family History  Problem Relation Age of Onset  . Breast cancer Sister     "behind heart"  . Colon cancer Sister   . Colon cancer Sister     SOCIAL HISTORY: Social History   Social History  . Marital Status: Widowed    Spouse Name: N/A  . Number of Children: 8  . Years of Education: 10th Grade   Occupational History  . Not on file.   Social History Main Topics  . Smoking status: Never Smoker   . Smokeless tobacco: Current User    Types: Snuff  . Alcohol Use: No  . Drug Use: No  .  Sexual Activity: No   Other Topics Concern  . Not on file   Social History Narrative   Patient lives with daughter in a two story home.   Patient has a 10th grade education.   Patient is retired.   Patient is right handed.    REVIEW OF SYSTEMS: Constitutional: No fevers, chills, or sweats, no generalized fatigue, change in appetite Eyes: No visual changes, double vision, eye pain Ear, nose and throat: No hearing loss, ear pain, nasal congestion, sore throat Cardiovascular: No chest pain, palpitations Respiratory:  No shortness of breath at rest or with exertion, wheezes GastrointestinaI: No nausea, vomiting, diarrhea, abdominal pain, fecal incontinence Genitourinary:  No dysuria, urinary retention or frequency Musculoskeletal:  No neck pain, back pain Integumentary: No rash, pruritus, skin lesions Neurological: as above Psychiatric: No depression, insomnia, anxiety Endocrine: No palpitations, fatigue, diaphoresis, mood swings, change in appetite, change in weight, increased thirst Hematologic/Lymphatic:  No anemia, purpura, petechiae. Allergic/Immunologic: no itchy/runny eyes, nasal congestion, recent allergic reactions, rashes  PHYSICAL EXAM: Filed Vitals:   08/02/15 1251  BP: 142/78  Pulse: 77  Temp: 98 F (36.7 C)  Resp: 16   General: No acute distress Head:  Normocephalic/atraumatic Eyes: Fundoscopic exam shows bilateral sharp discs, no vessel changes, exudates, or hemorrhages Neck: supple, no paraspinal tenderness, full range of motion Back: No paraspinal tenderness Heart: regular rate and rhythm Lungs: Clear to auscultation bilaterally. Vascular: No carotid bruits. Skin/Extremities: No rash, no edema Neurological Exam: Mental status: alert and oriented to person, place, month, day of week. No dysarthria or aphasia, Fund of knowledge is appropriate.  Remote memory intact.  Attention and concentration are normal.    Able to name objects and repeat phrases. Clock  drawing test 5/5. MMSE - Mini Mental State Exam 08/02/2015  Orientation to time 3  Orientation to Place 5  Registration 3  Attention/ Calculation 0  Recall 0  Language- name 2 objects 2  Language- repeat 1  Language- follow 3 step command 2  Language- read & follow direction 1  Write a sentence 1  Copy design 1  Total score 19   Cranial nerves: CN I: not tested CN II: pupils equal, round and reactive to light, visual fields intact, fundi unremarkable. CN III, IV, VI:  full range of motion, no nystagmus, no ptosis CN V: facial sensation intact CN VII: upper and lower face symmetric CN VIII: hearing intact to finger rub CN IX,  X: gag intact, uvula midline CN XI: sternocleidomastoid and trapezius muscles intact CN XII: tongue midline Bulk & Tone: normal, no fasciculations. Motor: 5/5 throughout with no pronator drift. Sensation: decreased vibration to ankles bilaterally, otherwise intact to light touch, cold, pin, and joint position sense.  No extinction to double simultaneous stimulation.  Romberg test negative Deep Tendon Reflexes: +1 throughout except for absent ankle jerks bilaterally, no ankle clonus Plantar responses: downgoing bilaterally Cerebellar: no incoordination on finger to nose, heel to shin. No dysdiadochokinesia Gait: slow and cautious, difficulty with tandem walk Tremor: none  IMPRESSION: This is a pleasant 81 year old right-handed woman with a history of hypertension, diabetes, CAD, mild to moderate dementia, presenting for evaluation of possible seizures. She has been having recurrent episodes of loss of consciousness over the past 5-6 years, recently admitted for transient aphasia, and during her hospital stay had an episode of generalized stiffening and unresponsiveness. She does have risk factors for seizures with dementia and family history of seizures. Convulsive syncope is also a consideration. MRI brain no acute changes, routine EEG unremarkable. A 24-hour  EEG will be ordered to further classify her symptoms. No medication changes for now, continue control of vascular risk factors and daily aspirin. She will follow-up after the EEG.   Thank you for allowing me to participate in the care of this patient. Please do not hesitate to call for any questions or concerns.   [name], M.D.  CC: Dr. Loanne Drilling

## 2015-08-08 ENCOUNTER — Telehealth: Payer: Self-pay | Admitting: *Deleted

## 2015-08-08 NOTE — Telephone Encounter (Signed)
Pt /needs to rescheduled upcoming EEG/ call back @ [phone] or [phone]

## 2015-08-09 ENCOUNTER — Ambulatory Visit (INDEPENDENT_AMBULATORY_CARE_PROVIDER_SITE_OTHER): Payer: Medicare Other | Admitting: Endocrinology

## 2015-08-09 ENCOUNTER — Telehealth: Payer: Self-pay | Admitting: Endocrinology

## 2015-08-09 VITALS — BP 178/80 | HR 78 | Temp 98.7°F | Ht 62.0 in | Wt 112.0 lb

## 2015-08-09 DIAGNOSIS — I10 Essential (primary) hypertension: Secondary | ICD-10-CM | POA: Diagnosis not present

## 2015-08-09 DIAGNOSIS — D509 Iron deficiency anemia, unspecified: Secondary | ICD-10-CM

## 2015-08-09 DIAGNOSIS — I2510 Atherosclerotic heart disease of native coronary artery without angina pectoris: Secondary | ICD-10-CM

## 2015-08-09 MED ORDER — CYANOCOBALAMIN 1000 MCG/ML IJ SOLN
1000.0000 ug | Freq: Once | INTRAMUSCULAR | Status: AC
  Administered 2015-08-09: 1000 ug via INTRAMUSCULAR

## 2015-08-09 MED ORDER — TRIAMTERENE-HCTZ 37.5-25 MG PO TABS: ORAL_TABLET | ORAL | Status: DC

## 2015-08-09 NOTE — Progress Notes (Signed)
Subjective:    Patient ID: [name], female    DOB: 1925-02-02, 81 y.o.   MRN: [id number]  HPI Pt is here with several relatives, who have been checking BP at home, and have noted it to be high.  pt states she feels well in general, except for a slight headache, but no assoc visual loss.  I asked pt's dtr, who says pt takes meds as rx'ed.   Past Medical History  Diagnosis Date  . Arrhythmia     paroxysmal atrial fibrillation  . Coronary artery disease   . Hypertension   . Hyperlipidemia   . Edema   . GERD (gastroesophageal reflux disease)   . Anemia B twelve deficiency   . Dizziness - giddy   . Abdominal pain   . Iron deficiency anemia, unspecified   . Pernicious anemia   . Thoracic or lumbosacral neuritis or radiculitis, unspecified   . Symptomatic menopausal or female climacteric states   . Lumbago   . Benign neoplasm of colon   . Allergic rhinitis, cause unspecified   . Type II diabetes mellitus   . Degeneration of lumbar or lumbosacral intervertebral disc   . Arthritis     "arms and hands" (06/30/2013)    Past Surgical History  Procedure Laterality Date  . Back surgery    . Cholecystectomy    . Appendectomy  1974  . Abdominal hysterectomy  1974  . Carpal tunnel release Bilateral 1970's  . Cataract extraction, bilateral Bilateral   . Cardiac catheterization    . Coronary angioplasty with stent placement      "1" (06/30/2013  . Lumbar disc surgery      Social History   Social History  . Marital Status: Widowed    Spouse Name: N/A  . Number of Children: 8  . Years of Education: 10th Grade   Occupational History  . Not on file.   Social History Main Topics  . Smoking status: Never Smoker   . Smokeless tobacco: Current User    Types: Snuff  . Alcohol Use: No  . Drug Use: No  . Sexual Activity: No   Other Topics Concern  . Not on file   Social History Narrative   Patient lives with daughter in a two story home.   Patient has a 10th grade  education.   Patient is retired.   Patient is right handed.    Current Outpatient Prescriptions on File Prior to Visit  Medication Sig Dispense Refill  . aspirin 81 MG EC tablet Take 81 mg by mouth daily.      . calcium carbonate (TUMS - DOSED IN MG ELEMENTAL CALCIUM) 500 MG chewable tablet Chew 1 tablet by mouth as needed for heartburn.    . diclofenac sodium (VOLTAREN) 1 % GEL Apply 4 g topically 4 (four) times daily. As needed for pain 100 g 5  . donepezil (ARICEPT) 5 MG tablet Take 1 tablet (5 mg total) by mouth at bedtime. 30 tablet 11  . FERREX 150 150 MG capsule TAKE 1 CAPSULE (150 MG TOTAL) BY MOUTH 2 (TWO) TIMES DAILY. 60 capsule 1  . metFORMIN (GLUCOPHAGE-XR) 500 MG 24 hr tablet TAKE ONE TABLET BY MOUTH TWICE DAILY 60 tablet 0  . metoprolol succinate (TOPROL-XL) 50 MG 24 hr tablet TAKE 1 TABLET (50 MG TOTAL) BY MOUTH DAILY. TAKE WITH OR IMMEDIATELY FOLLOWING A MEAL. 30 tablet 8  . nitroGLYCERIN (NITROSTAT) 0.4 MG SL tablet Place 0.4 mg under the tongue every 5 (five)  minutes as needed.      [name] omeprazole (PRILOSEC) 40 MG capsule Take 40 mg by mouth daily as needed (heartburn).   11  . triamcinolone cream (KENALOG) 0.1 % Apply 1 application topically 4 (four) times daily. As needed for rash 80 g 3   No current facility-administered medications on file prior to visit.    Allergies  Allergen Reactions  . Penicillins Swelling    REACTION: swelling of joints  . Sulfonamide Derivatives Other (See Comments)    REACTION: "like my head was full of water"  . Albumin (Human) Other (See Comments)    Doesn't remember   . Clindamycin Other (See Comments)    Doesn't remember   . Iodides Hives  . Iodinated Diagnostic Agents Hives  . Sulfa Antibiotics Hives  . Celecoxib Itching and Rash  . Erythromycin Itching and Rash  . Nitrofuran Derivatives Rash  . Nitrofurantoin Itching and Rash  . Piroxicam Other (See Comments)    unknown  . Povidone-Iodine Itching and Rash    Family History    Problem Relation Age of Onset  . Breast cancer Sister     "behind heart"  . Colon cancer Sister   . Colon cancer Sister     BP 178/80 mmHg  Pulse 78  Temp(Src) 98.7 F (37.1 C) (Oral)  Ht 5\' 2"  (1.575 m)  Wt 112 lb (50.803 kg)  BMI 20.48 kg/m2  SpO2 97%  Review of Systems Denies chest pain and sob.    Objective:   Physical Exam VITAL SIGNS:  See vs page GENERAL: no distress LUNGS:  Clear to auscultation HEART:  Regular rate and rhythm without murmurs noted. Normal S1,S2.   Ext: no edema.   i personally reviewed electrocardiogram tracing (07/22/15): Indication: HTN Impression: QS complexes anteriorly Lab Results  Component Value Date   CREATININE 1.00 07/22/2015   BUN 31* 07/22/2015   NA 139 07/22/2015   K 3.7 07/22/2015   CL 103 07/22/2015   CO2 24 07/22/2015      Assessment & Plan:  HTN: worse for uncertain reason.     Patient is advised the following: Patient Instructions  i have sent a prescription to your pharmacy, for an additional blood pressure pill. Please come back for a follow-up appointment in 2 weeks.   Your blood pressure may get better soon, so we may be able to stop this pill at some point.

## 2015-08-09 NOTE — Telephone Encounter (Signed)
Patients daughter called stating that the medication Dr. Loanne Drilling prescribed her will interfere with her allergie to medications per the Pharmacist   Rx: Triamterene   Please advise patient

## 2015-08-09 NOTE — Patient Instructions (Addendum)
i have sent a prescription to your pharmacy, for an additional blood pressure pill. Please come back for a follow-up appointment in 2 weeks.   Your blood pressure may get better soon, so we may be able to stop this pill at some point.

## 2015-08-10 NOTE — Telephone Encounter (Signed)
Pt's daughter advised Sales promotion account executive Enid Derry) of note below and voiced understanding.

## 2015-08-10 NOTE — Telephone Encounter (Signed)
No, you don't need to worry about this.

## 2015-08-10 NOTE — Telephone Encounter (Signed)
See note below and please advise, Thanks! 

## 2015-08-16 ENCOUNTER — Ambulatory Visit (INDEPENDENT_AMBULATORY_CARE_PROVIDER_SITE_OTHER): Payer: Medicare Other | Admitting: Neurology

## 2015-08-16 ENCOUNTER — Other Ambulatory Visit: Payer: Medicare Other

## 2015-08-16 DIAGNOSIS — R404 Transient alteration of awareness: Secondary | ICD-10-CM | POA: Diagnosis not present

## 2015-08-16 DIAGNOSIS — R569 Unspecified convulsions: Secondary | ICD-10-CM

## 2015-08-19 ENCOUNTER — Encounter (HOSPITAL_COMMUNITY): Payer: Self-pay

## 2015-08-19 ENCOUNTER — Emergency Department (HOSPITAL_COMMUNITY): Payer: Medicare Other

## 2015-08-19 ENCOUNTER — Emergency Department (HOSPITAL_COMMUNITY)
Admission: EM | Admit: 2015-08-19 | Discharge: 2015-08-19 | Disposition: A | Discharge status: Home / Self Care | Payer: Medicare Other | Source: Home / Self Care | Attending: Emergency Medicine | Admitting: Emergency Medicine

## 2015-08-19 DIAGNOSIS — R4781 Slurred speech: Secondary | ICD-10-CM | POA: Diagnosis not present

## 2015-08-19 DIAGNOSIS — Z720 Tobacco use: Secondary | ICD-10-CM | POA: Diagnosis not present

## 2015-08-19 DIAGNOSIS — R1319 Other dysphagia: Secondary | ICD-10-CM | POA: Diagnosis not present

## 2015-08-19 DIAGNOSIS — G451 Carotid artery syndrome (hemispheric): Secondary | ICD-10-CM

## 2015-08-19 DIAGNOSIS — I471 Supraventricular tachycardia: Secondary | ICD-10-CM | POA: Diagnosis not present

## 2015-08-19 DIAGNOSIS — R4701 Aphasia: Secondary | ICD-10-CM | POA: Diagnosis not present

## 2015-08-19 DIAGNOSIS — I480 Paroxysmal atrial fibrillation: Secondary | ICD-10-CM | POA: Diagnosis not present

## 2015-08-19 DIAGNOSIS — G459 Transient cerebral ischemic attack, unspecified: Secondary | ICD-10-CM

## 2015-08-19 DIAGNOSIS — R531 Weakness: Secondary | ICD-10-CM | POA: Diagnosis not present

## 2015-08-19 DIAGNOSIS — G8194 Hemiplegia, unspecified affecting left nondominant side: Secondary | ICD-10-CM | POA: Diagnosis not present

## 2015-08-19 DIAGNOSIS — I639 Cerebral infarction, unspecified: Secondary | ICD-10-CM | POA: Diagnosis not present

## 2015-08-19 DIAGNOSIS — M79605 Pain in left leg: Secondary | ICD-10-CM | POA: Diagnosis not present

## 2015-08-19 DIAGNOSIS — I6789 Other cerebrovascular disease: Secondary | ICD-10-CM | POA: Diagnosis not present

## 2015-08-19 LAB — I-STAT CHEM 8, ED
BUN: 31 mg/dL — ABNORMAL HIGH (ref 6–20)
Calcium, Ion: 1.32 mmol/L — ABNORMAL HIGH (ref 1.13–1.30)
Chloride: 105 mmol/L (ref 101–111)
Creatinine, Ser: 1.1 mg/dL — ABNORMAL HIGH (ref 0.44–1.00)
Glucose, Bld: 156 mg/dL — ABNORMAL HIGH (ref 65–99)
HCT: 37 % (ref 36.0–46.0)
Hemoglobin: 12.6 g/dL (ref 12.0–15.0)
Potassium: 4.5 mmol/L (ref 3.5–5.1)
Sodium: 139 mmol/L (ref 135–145)
TCO2: 22 mmol/L (ref 0–100)

## 2015-08-19 NOTE — Consult Note (Signed)
Referring Physician: ED    Chief Complaint: dysarthria, generalized weakness, difficulty walking (resolved)  HPI:                                                                                                                                         [name] is an 81 y.o. female well known to our service, with a past medical history significant for HTN, hyperlipidemia, DM type 2, CAD s/p stenting, PAF off anticoagulation, TIA (dysarthria, dysphasia), brought in by family for evaluation of the aforementioned symptoms. Daughters are at the bedside and said that [name] was in her usual state of health until yesterday evening when they noticed that she was not speaking clearly, " kind of slurred, mumbling" , very weak all over, tired, and also no walking as she usually does. They report no confusion or trouble with comprehension of language. [name] denies associated HA, vertigo, double vision, difficulty swallowing, focal weakness or numbness, unsteadiness, falls, or vision impairment. No recent fever, infection, or new medications. She has been taking aspirin 81 mg religiously. CT brain performed this morning was independently reviewed and showed no acute abnormality. Available serologies are unrevealing. Importantly, patient had comprehensive but unremarkable TIA work up this past August, and was last seen by neurology 07/22/15 due to complains of speech changes and lethargy.  Furthermore, she had EEG as outpatient just completed and they are awaiting for the results.  Date last known well: 08/18/15 Time last known well: unable to determine tPA Given: no, symptoms resolved   Past Medical History  Diagnosis Date  . Arrhythmia     paroxysmal atrial fibrillation  . Coronary artery disease   . Hypertension   . Hyperlipidemia   . Edema   . GERD (gastroesophageal reflux disease)   . Anemia B twelve deficiency   . Dizziness - giddy   . Abdominal pain   . Iron deficiency  anemia, unspecified   . Pernicious anemia   . Thoracic or lumbosacral neuritis or radiculitis, unspecified   . Symptomatic menopausal or female climacteric states   . Lumbago   . Benign neoplasm of colon   . Allergic rhinitis, cause unspecified   . Type II diabetes mellitus ([place])   . Degeneration of lumbar or lumbosacral intervertebral disc   . Arthritis     "arms and hands" (06/30/2013)    Past Surgical History  Procedure Laterality Date  . Back surgery    . Cholecystectomy    . Appendectomy  1974  . Abdominal hysterectomy  1974  . Carpal tunnel release Bilateral 1970's  . Cataract extraction, bilateral Bilateral   . Cardiac catheterization    . Coronary angioplasty with stent placement      "1" (06/30/2013  . Lumbar disc surgery      Family History  Problem Relation Age of Onset  . Breast cancer Sister     "behind heart"  .  Colon cancer Sister   . Colon cancer Sister    Social History:  reports that she has never smoked. Her smokeless tobacco use includes Snuff. She reports that she does not drink alcohol or use illicit drugs. Family history: no MS, epilepsy, brain tumor, or brain aneurysm Allergies:  Allergies  Allergen Reactions  . Penicillins Swelling    REACTION: swelling of joints  . Sulfonamide Derivatives Other (See Comments)    REACTION: "like my head was full of water"  . Albumin (Human) Other (See Comments)    Doesn't remember   . Clindamycin Other (See Comments)    Doesn't remember   . Iodides Hives  . Iodinated Diagnostic Agents Hives  . Sulfa Antibiotics Hives  . Celecoxib Itching and Rash  . Erythromycin Itching and Rash  . Nitrofuran Derivatives Rash  . Nitrofurantoin Itching and Rash  . Piroxicam Other (See Comments)    unknown  . Povidone-Iodine Itching and Rash    Medications:                                                                                                                           I have reviewed the patient's current  medications.  ROS:                                                                                                                                       History obtained from chart review and the patient  General ROS: negative for - chills, fatigue, fever, night sweats, weight gain or weight loss Psychological ROS: negative for - behavioral disorder, hallucinations, memory difficulties, mood swings or suicidal ideation Ophthalmic ROS: negative for - blurry vision, double vision, eye pain or loss of vision ENT ROS: negative for - epistaxis, nasal discharge, oral lesions, sore throat, tinnitus or vertigo Allergy and Immunology ROS: negative for - hives or itchy/watery eyes Hematological and Lymphatic ROS: negative for - bleeding problems, bruising or swollen lymph nodes Endocrine ROS: negative for - galactorrhea, hair pattern changes, polydipsia/polyuria or temperature intolerance Respiratory ROS: negative for - cough, hemoptysis, shortness of breath or wheezing Cardiovascular ROS: negative for - chest pain, dyspnea on exertion, edema or irregular heartbeat Gastrointestinal ROS: negative for - abdominal pain, diarrhea, hematemesis, nausea/vomiting or stool incontinence Genito-Urinary ROS: negative for - dysuria, hematuria, incontinence or urinary frequency/urgency Musculoskeletal ROS: negative for - joint swelling or muscular weakness  Neurological ROS: as noted in HPI Dermatological ROS: negative for rash and skin lesion changes   Physical exam:  Constitutional: well developed, pleasant female in no apparent distress. Blood pressure 174/62, pulse 62, temperature 97.8 F (36.6 C), temperature source Oral, resp. rate 15, height 5\' 2"  (1.575 m), weight 50.803 kg (112 lb), SpO2 100 %. Eyes: no jaundice or exophthalmos.  Head: normocephalic. Neck: supple, no bruits, no JVD. Cardiac: no murmurs. Lungs: clear. Abdomen: soft, no tender, no mass. Extremities: no edema, clubbing, or cyanosis.   Skin: no rash  Neurologic Examination:                                                                                                      General: NAD Mental Status: Alert, oriented, thought content appropriate.  Speech fluent without evidence of aphasia.  Able to follow 3 step commands without difficulty. Cranial Nerves: II: Discs flat bilaterally; Visual fields grossly normal, pupils equal, round, reactive to light and accommodation III,IV, VI: ptosis not present, extra-ocular motions intact bilaterally V,VII: smile symmetric, facial light touch sensation normal bilaterally VIII: hearing normal bilaterally IX,X: uvula rises symmetrically XI: bilateral shoulder shrug XII: midline tongue extension without atrophy or fasciculations  Motor: Right : Upper extremity   5/5    Left:     Upper extremity   5/5  Lower extremity   5/5     Lower extremity   5/5 Tone and bulk:normal tone throughout; no atrophy noted Sensory: Pinprick and light touch intact throughout, bilaterally Deep Tendon Reflexes:  1+ all over Plantars: Right: downgoing   Left: downgoing Cerebellar: normal finger-to-nose,  normal heel-to-shin test Gait:  No tested due to multiple leads    Results for orders placed or performed during the hospital encounter of 08/19/15 (from the past 48 hour(s))  I-stat chem 8, ed     Status: Abnormal   Collection Time: 08/19/15  8:13 AM  Result Value Ref Range   Sodium 139 135 - 145 mmol/L   Potassium 4.5 3.5 - 5.1 mmol/L   Chloride 105 101 - 111 mmol/L   BUN 31 (H) 6 - 20 mg/dL   Creatinine, Ser 1.10 (H) 0.44 - 1.00 mg/dL   Glucose, Bld 156 (H) 65 - 99 mg/dL   Calcium, Ion 1.32 (H) 1.13 - 1.30 mmol/L   TCO2 22 0 - 100 mmol/L   Hemoglobin 12.6 12.0 - 15.0 g/dL   HCT 37.0 36.0 - 46.0 %   Ct Head Wo Contrast  08/19/2015   CLINICAL DATA:  Slurred speech. Gait disturbance. Symptoms began 1,700 yesterday.  EXAM: CT HEAD WITHOUT CONTRAST  TECHNIQUE: Contiguous axial images were  obtained from the base of the skull through the vertex without intravenous contrast.  COMPARISON:  07/22/2015 CT.  MRI 07/12/2015.  FINDINGS: The brain shows mild generalized atrophy. There are mild chronic small-vessel ischemic changes of the hemispheric deep white matter. There is focal encephalomalacia at the right temporal tip. No CT evidence of acute infarction. No mass lesion, hemorrhage, hydrocephalus or extra-axial collection. The calvarium is unremarkable. Sinuses are clear.  IMPRESSION:  No acute finding by CT. Atrophy and mild chronic small-vessel ischemic change. Chronic encephalomalacia at the right temporal tip. Atherosclerosis of the major vessels at the base of the brain.   Electronically Signed   By: Nelson Chimes M.D.   On: 08/19/2015 09:24    Assessment: 81 y.o. female with multiple risk factors for stroke, presents with recurrent transient episode of speech impairment, generalized weakness, trouble walking. Her neuro-exam is unremarkable, CT brain showed no acute abnormality, and had recent comprehensive TIA work up that was unrevealing. She is currently asymptomatic from a neurological  Standpoint. Although I can not conclusively say that she did not have a TIA, this seems to be unlikely. In any case, don't recommend further imaging or admission to the hospital as her TIA work up was recently done. Agree that patient can be discharge home on aspirin 81 mg daily. Advised to call her neurologist for follow up.  Stroke Risk Factors - age, HTN, hyperlipidemia, DM, CAD, PAF,TIA   [name], MD [facility] [phone]  08/19/2015, 10:39 AM

## 2015-08-19 NOTE — ED Provider Notes (Signed)
CSN: [id number]     Arrival date & time 08/19/15  0728 History   First MD Initiated Contact with Patient 08/19/15 [phone]     Chief Complaint  Patient presents with  . Aphasia      HPI Pt. s family reports that pt. Developed slurred speech ,yesterday approximately at 17:00. Pt. Also having generalized weakness. Pt. Is voiding appropriately. Pt. Spoke about nausea this morning only, no v/d . Pt. Having difficulty walking, weak . Pt. Is alert and oriented x4 Past Medical History  Diagnosis Date  . Arrhythmia     paroxysmal atrial fibrillation  . Coronary artery disease   . Hypertension   . Hyperlipidemia   . Edema   . GERD (gastroesophageal reflux disease)   . Anemia B twelve deficiency   . Dizziness - giddy   . Abdominal pain   . Iron deficiency anemia, unspecified   . Pernicious anemia   . Thoracic or lumbosacral neuritis or radiculitis, unspecified   . Symptomatic menopausal or female climacteric states   . Lumbago   . Benign neoplasm of colon   . Allergic rhinitis, cause unspecified   . Type II diabetes mellitus ([place])   . Degeneration of lumbar or lumbosacral intervertebral disc   . Arthritis     "arms and hands" (06/30/2013)   Past Surgical History  Procedure Laterality Date  . Back surgery    . Cholecystectomy    . Appendectomy  1974  . Abdominal hysterectomy  1974  . Carpal tunnel release Bilateral 1970's  . Cataract extraction, bilateral Bilateral   . Cardiac catheterization    . Coronary angioplasty with stent placement      "1" (06/30/2013  . Lumbar disc surgery     Family History  Problem Relation Age of Onset  . Breast cancer Sister     "behind heart"  . Colon cancer Sister   . Colon cancer Sister    Social History  Substance Use Topics  . Smoking status: Never Smoker   . Smokeless tobacco: Current User    Types: Snuff  . Alcohol Use: No   OB History    No data available     Review of Systems All other systems reviewed and are  negative   Allergies  Penicillins; Sulfonamide derivatives; Albumin (human); Clindamycin; Iodides; Iodinated diagnostic agents; Sulfa antibiotics; Celecoxib; Erythromycin; Nitrofuran derivatives; Nitrofurantoin; Piroxicam; and Povidone-iodine  Home Medications   Prior to Admission medications   Medication Sig Start Date End Date Taking? Authorizing Provider  aspirin 81 MG EC tablet Take 81 mg by mouth daily.     Yes Historical Provider, MD  calcium carbonate (TUMS - DOSED IN MG ELEMENTAL CALCIUM) 500 MG chewable tablet Chew 1 tablet by mouth as needed for heartburn.   Yes Historical Provider, MD  donepezil (ARICEPT) 5 MG tablet Take 1 tablet (5 mg total) by mouth at bedtime. 03/27/15  Yes Renato Shin, MD  FERREX 150 150 MG capsule TAKE 1 CAPSULE (150 MG TOTAL) BY MOUTH 2 (TWO) TIMES DAILY. Patient taking differently: TAKE 1 CAPSULE (150 MG TOTAL) BY MOUTH DAILY. 07/18/15  Yes Renato Shin, MD  metFORMIN (GLUCOPHAGE-XR) 500 MG 24 hr tablet TAKE ONE TABLET BY MOUTH TWICE DAILY 07/18/15  Yes Renato Shin, MD  metoprolol succinate (TOPROL-XL) 50 MG 24 hr tablet TAKE 1 TABLET (50 MG TOTAL) BY MOUTH DAILY. TAKE WITH OR IMMEDIATELY FOLLOWING A MEAL. 06/02/15  Yes Deboraha Sprang, MD  triamterene-hydrochlorothiazide [facility]) 37.5-25 MG tablet 1/2 tab daily Patient taking  differently: Take 0.5 tablets by mouth daily. 1/2 tab daily 08/09/15  Yes Renato Shin, MD  diclofenac sodium (VOLTAREN) 1 % GEL Apply 4 g topically 4 (four) times daily. As needed for pain 02/24/15   Renato Shin, MD  nitroGLYCERIN (NITROSTAT) 0.4 MG SL tablet Place 0.4 mg under the tongue every 5 (five) minutes as needed.      Historical Provider, MD  omeprazole (PRILOSEC) 40 MG capsule Take 40 mg by mouth daily as needed (heartburn).  05/22/15   Historical Provider, MD  triamcinolone cream (KENALOG) 0.1 % Apply 1 application topically 4 (four) times daily. As needed for rash 03/27/15   Renato Shin, MD   BP 174/62 mmHg  Pulse 62   Temp(Src) 97.8 F (36.6 C) (Oral)  Resp 15  Ht 5\' 2"  (1.575 m)  Wt 112 lb (50.803 kg)  BMI 20.48 kg/m2  SpO2 100% Physical Exam Physical Exam  Nursing note and vitals reviewed. Constitutional: She is oriented to person, place, and time. She appears well-developed and well-nourished. No distress.  HENT:  Head: Normocephalic and atraumatic.  Eyes: Pupils are equal, round, and reactive to light.  Neck: Normal range of motion.  Cardiovascular: Normal rate and intact distal pulses.   Pulmonary/Chest: No respiratory distress.  Abdominal: Normal appearance. She exhibits no distension.  Musculoskeletal: Normal range of motion.  Neurological: She is alert and oriented to person, place, and time. No cranial nerve deficit.  No lateralizing weakness.   Skin: Skin is warm and dry. No rash noted.  Psychiatric: She has a normal mood and affect. Her behavior is normal.   ED Course  Procedures (including critical care time) Labs Review Labs Reviewed  I-STAT CHEM 8, ED - Abnormal; Notable for the following:    BUN 31 (*)    Creatinine, Ser 1.10 (*)    Glucose, Bld 156 (*)    Calcium, Ion 1.32 (*)    All other components within normal limits    Imaging Review Ct Head Wo Contrast  08/19/2015   CLINICAL DATA:  Slurred speech. Gait disturbance. Symptoms began 1,700 yesterday.  EXAM: CT HEAD WITHOUT CONTRAST  TECHNIQUE: Contiguous axial images were obtained from the base of the skull through the vertex without intravenous contrast.  COMPARISON:  07/22/2015 CT.  MRI 07/12/2015.  FINDINGS: The brain shows mild generalized atrophy. There are mild chronic small-vessel ischemic changes of the hemispheric deep white matter. There is focal encephalomalacia at the right temporal tip. No CT evidence of acute infarction. No mass lesion, hemorrhage, hydrocephalus or extra-axial collection. The calvarium is unremarkable. Sinuses are clear.  IMPRESSION: No acute finding by CT. Atrophy and mild chronic small-vessel  ischemic change. Chronic encephalomalacia at the right temporal tip. Atherosclerosis of the major vessels at the base of the brain.   Electronically Signed   By: Nelson Chimes M.D.   On: 08/19/2015 09:24   I have personally reviewed and evaluated these images and lab results as part of my medical decision-making.   EKG Interpretation   Date/Time:  Saturday August 19 2015 08:12:18 EDT Ventricular Rate:  61 PR Interval:  232 QRS Duration: 75 QT Interval:  417 QTC Calculation: 420 R Axis:   12 Text Interpretation:  Sinus rhythm Prolonged PR interval Anterior infarct,  old Confirmed by Jessamy Torosyan  MD, Holly Pring ([id number]) on 08/19/2015 8:15:30 AM     Patient was seen and evaluated by neurology.  At this time recommendation are to continue with aspirin.   MDM   Final diagnoses:  Transient  cerebral ischemia, unspecified transient cerebral ischemia type        Leonard Schwartz, MD 08/19/15 1040

## 2015-08-19 NOTE — Discharge Instructions (Signed)
Transient Ischemic Attack °A transient ischemic attack (TIA) is a "warning stroke" that causes stroke-like symptoms. Unlike a stroke, a TIA does not cause permanent damage to the brain. The symptoms of a TIA can happen very fast and do not last long. It is important to know the symptoms of a TIA and what to do. This can help prevent a major stroke or death. °CAUSES  °A TIA is caused by a temporary blockage in an artery in the brain or neck (carotid artery). The blockage does not allow the brain to get the blood supply it needs and can cause different symptoms. The blockage can be caused by either: °· A blood clot. °· Fatty buildup (plaque) in a neck or brain artery. °RISK FACTORS °· High blood pressure (hypertension). °· High cholesterol. °· Diabetes mellitus. °· Heart disease. °· The buildup of plaque in the blood vessels (peripheral artery disease or atherosclerosis). °· The buildup of plaque in the blood vessels that provide blood and oxygen to the brain (carotid artery stenosis). °· An abnormal heart rhythm (atrial fibrillation). °· Obesity. °· Using any tobacco products, including cigarettes, chewing tobacco, or electronic cigarettes. °· Taking oral contraceptives, especially in combination with using tobacco. °· Physical inactivity. °· A diet high in fats, salt (sodium), and calories. °· Excessive alcohol use. °· Use of illegal drugs (especially cocaine and methamphetamine). °· Being female. °· Being African American. °· Being over the age of 55 years. °· Family history of stroke. °· Previous history of blood clots, stroke, TIA, or heart attack. °· Sickle cell disease. °SIGNS AND SYMPTOMS  °TIA symptoms are the same as a stroke but are temporary. These symptoms usually develop suddenly, or may be newly present upon waking from sleep: °· Sudden weakness or numbness of the face, arm, or leg, especially on one side of the body. °· Sudden trouble walking or difficulty moving arms or legs. °· Sudden  confusion. °· Sudden personality changes. °· Trouble speaking (aphasia) or understanding. °· Difficulty swallowing. °· Sudden trouble seeing in one or both eyes. °· Double vision. °· Dizziness. °· Loss of balance or coordination. °· Sudden severe headache with no known cause. °· Trouble reading or writing. °· Loss of bowel or bladder control. °· Loss of consciousness. °DIAGNOSIS  °Your health care provider may be able to determine the presence or absence of a TIA based on your symptoms, history, and physical exam. CT scan of the brain is usually performed to help identify a TIA. Other tests may include: °· Electrocardiography (ECG). °· Continuous heart monitoring. °· Echocardiography. °· Carotid ultrasonography. °· MRI. °· A scan of the brain circulation. °· Blood tests. °TREATMENT  °Since the symptoms of TIA are the same as a stroke, it is important to seek treatment as soon as possible. You may need a medicine to dissolve a blood clot (thrombolytic) if that is the cause of the TIA. This medicine cannot be given if too much time has passed. Treatment may also include:  °· Rest, oxygen, fluids through an IV tube, and medicines to thin the blood (anticoagulants). °· Measures will be taken to prevent short-term and long-term complications, including infection from breathing foreign material into the lungs (aspiration pneumonia), blood clots in the legs, and falls. °· Procedures to either remove plaque in the carotid arteries or dilate carotid arteries that have narrowed due to plaque. Those procedures are: °¨ Carotid endarterectomy. °¨ Carotid angioplasty and stenting. °· Medicines and diet may be used to address diabetes, high blood pressure, and   other underlying risk factors. °HOME CARE INSTRUCTIONS  °· Take medicines only as directed by your health care provider. Follow the directions carefully. Medicines may be used to control risk factors for a stroke. Be sure you understand all your medicine instructions. °· You  may be told to take aspirin or the anticoagulant warfarin. Warfarin needs to be taken exactly as instructed. °¨ Taking too much or too little warfarin is dangerous. Too much warfarin increases the risk of bleeding. Too little warfarin continues to allow the risk for blood clots. While taking warfarin, you will need to have regular blood tests to measure your blood clotting time. A PT blood test measures how long it takes for blood to clot. Your PT is used to calculate another value called an INR. Your PT and INR help your health care provider to adjust your dose of warfarin. The dose can change for many reasons. It is critically important that you take warfarin exactly as prescribed. °¨ Many foods, especially foods high in vitamin K can interfere with warfarin and affect the PT and INR. Foods high in vitamin K include spinach, kale, broccoli, cabbage, collard and turnip greens, Brussels sprouts, peas, cauliflower, seaweed, and parsley, as well as beef and pork liver, green tea, and soybean oil. You should eat a consistent amount of foods high in vitamin K. Avoid major changes in your diet, or notify your health care provider before changing your diet. Arrange a visit with a dietitian to answer your questions. °¨ Many medicines can interfere with warfarin and affect the PT and INR. You must tell your health care provider about any and all medicines you take; this includes all vitamins and supplements. Be especially cautious with aspirin and anti-inflammatory medicines. Do not take or discontinue any prescribed or over-the-counter medicine except on the advice of your health care provider or pharmacist. °¨ Warfarin can have side effects, such as excessive bruising or bleeding. You will need to hold pressure over cuts for longer than usual. Your health care provider or pharmacist will discuss other potential side effects. °¨ Avoid sports or activities that may cause injury or bleeding. °¨ Be careful when shaving,  flossing your teeth, or handling sharp objects. °¨ Alcohol can change the body's ability to handle warfarin. It is best to avoid alcoholic drinks or consume only very small amounts while taking warfarin. Notify your health care provider if you change your alcohol intake. °¨ Notify your dentist or other health care providers before procedures. °· Eat a diet that includes 5 or more servings of fruits and vegetables each day. This may reduce the risk of stroke. Certain diets may be prescribed to address high blood pressure, high cholesterol, diabetes, or obesity. °¨ A diet low in sodium, saturated fat, trans fat, and cholesterol is recommended to manage high blood pressure. °¨ A diet low in saturated fat, trans fat, and cholesterol, and high in fiber may control cholesterol levels. °¨ A controlled-carbohydrate, controlled-sugar diet is recommended to manage diabetes. °¨ A reduced-calorie diet that is low in sodium, saturated fat, trans fat, and cholesterol is recommended to manage obesity. °· Maintain a healthy weight. °· Stay physically active. It is recommended that you get at least 30 minutes of activity on most or all days. °· Do not use any tobacco products, including cigarettes, chewing tobacco, or electronic cigarettes. If you need help quitting, ask your health care provider. °· Limit alcohol intake to no more than 1 drink per day for nonpregnant women and 2 drinks   per day for men. One drink equals 12 ounces of beer, 5 ounces of wine, or 1½ ounces of hard liquor. °· Do not abuse drugs. °· A safe home environment is important to reduce the risk of falls. Your health care provider may arrange for specialists to evaluate your home. Having grab bars in the bedroom and bathroom is often important. Your health care provider may arrange for equipment to be used at home, such as raised toilets and a seat for the shower. °· Follow all instructions for follow-up with your health care provider. This is very important.  This includes any referrals and lab tests. Proper follow-up can prevent a stroke or another TIA from occurring. °PREVENTION  °The risk of a TIA can be decreased by appropriately treating high blood pressure, high cholesterol, diabetes, heart disease, and obesity, and by quitting smoking, limiting alcohol, and staying physically active. °SEEK MEDICAL CARE IF: °· You have personality changes. °· You have difficulty swallowing. °· You are seeing double. °· You have dizziness. °· You have a fever. °SEEK IMMEDIATE MEDICAL CARE IF:  °Any of the following symptoms may represent a serious problem that is an emergency. Do not wait to see if the symptoms will go away. Get medical help right away. Call your local emergency services (911 in [place] Do not drive yourself to the hospital. °· You have sudden weakness or numbness of the face, arm, or leg, especially on one side of the body. °· You have sudden trouble walking or difficulty moving arms or legs. °· You have sudden confusion. °· You have trouble speaking (aphasia) or understanding. °· You have sudden trouble seeing in one or both eyes. °· You have a loss of balance or coordination. °· You have a sudden, severe headache with no known cause. °· You have new chest pain or an irregular heartbeat. °· You have a partial or total loss of consciousness. °MAKE SURE YOU:  °· Understand these instructions. °· Will watch your condition. °· Will get help right away if you are not doing well or get worse. °  °This information is not intended to replace advice given to you by your health care provider. Make sure you discuss any questions you have with your health care provider. °  °Document Released: 08/07/2005 Document Revised: 11/18/2014 Document Reviewed: 02/02/2014 °Elsevier Interactive Patient Education ©2016 [employer] ° °

## 2015-08-19 NOTE — ED Notes (Signed)
Pt. s family reports that pt. Developed slurred speech ,yesterday approximately at 17:00.  Pt. Also having generalized weakness.  Pt. Is voiding appropriately.  Pt. Spoke about nausea this morning only, no v/d . Pt. Having difficulty walking, weak  . Pt. Is alert and oriented x4

## 2015-08-20 ENCOUNTER — Emergency Department (HOSPITAL_COMMUNITY): Payer: Medicare Other

## 2015-08-20 ENCOUNTER — Inpatient Hospital Stay (HOSPITAL_COMMUNITY)
Admission: EM | Admit: 2015-08-20 | Discharge: 2015-08-24 | DRG: 065 | Disposition: A | Discharge status: Skilled Nursing Facility | Payer: Medicare Other | Attending: Internal Medicine | Admitting: Internal Medicine

## 2015-08-20 ENCOUNTER — Encounter (HOSPITAL_COMMUNITY): Payer: Self-pay | Admitting: *Deleted

## 2015-08-20 DIAGNOSIS — Z8673 Personal history of transient ischemic attack (TIA), and cerebral infarction without residual deficits: Secondary | ICD-10-CM

## 2015-08-20 DIAGNOSIS — I471 Supraventricular tachycardia, unspecified: Secondary | ICD-10-CM | POA: Insufficient documentation

## 2015-08-20 DIAGNOSIS — I10 Essential (primary) hypertension: Secondary | ICD-10-CM | POA: Diagnosis not present

## 2015-08-20 DIAGNOSIS — W19XXXA Unspecified fall, initial encounter: Secondary | ICD-10-CM

## 2015-08-20 DIAGNOSIS — Z7982 Long term (current) use of aspirin: Secondary | ICD-10-CM

## 2015-08-20 DIAGNOSIS — I639 Cerebral infarction, unspecified: Principal | ICD-10-CM | POA: Diagnosis present

## 2015-08-20 DIAGNOSIS — I480 Paroxysmal atrial fibrillation: Secondary | ICD-10-CM | POA: Insufficient documentation

## 2015-08-20 DIAGNOSIS — R2990 Unspecified symptoms and signs involving the nervous system: Secondary | ICD-10-CM

## 2015-08-20 DIAGNOSIS — Z794 Long term (current) use of insulin: Secondary | ICD-10-CM

## 2015-08-20 DIAGNOSIS — F0390 Unspecified dementia without behavioral disturbance: Secondary | ICD-10-CM | POA: Diagnosis present

## 2015-08-20 DIAGNOSIS — Z888 Allergy status to other drugs, medicaments and biological substances status: Secondary | ICD-10-CM

## 2015-08-20 DIAGNOSIS — Z882 Allergy status to sulfonamides status: Secondary | ICD-10-CM

## 2015-08-20 DIAGNOSIS — Z79899 Other long term (current) drug therapy: Secondary | ICD-10-CM

## 2015-08-20 DIAGNOSIS — R4781 Slurred speech: Secondary | ICD-10-CM | POA: Diagnosis not present

## 2015-08-20 DIAGNOSIS — Z91048 Other nonmedicinal substance allergy status: Secondary | ICD-10-CM

## 2015-08-20 DIAGNOSIS — R1319 Other dysphagia: Secondary | ICD-10-CM | POA: Diagnosis present

## 2015-08-20 DIAGNOSIS — R4701 Aphasia: Secondary | ICD-10-CM | POA: Diagnosis present

## 2015-08-20 DIAGNOSIS — I6339 Cerebral infarction due to thrombosis of other cerebral artery: Secondary | ICD-10-CM

## 2015-08-20 DIAGNOSIS — R531 Weakness: Secondary | ICD-10-CM | POA: Diagnosis not present

## 2015-08-20 DIAGNOSIS — Z880 Allergy status to penicillin: Secondary | ICD-10-CM

## 2015-08-20 DIAGNOSIS — I2510 Atherosclerotic heart disease of native coronary artery without angina pectoris: Secondary | ICD-10-CM | POA: Diagnosis present

## 2015-08-20 DIAGNOSIS — Z90710 Acquired absence of both cervix and uterus: Secondary | ICD-10-CM

## 2015-08-20 DIAGNOSIS — E119 Type 2 diabetes mellitus without complications: Secondary | ICD-10-CM

## 2015-08-20 DIAGNOSIS — I672 Cerebral atherosclerosis: Secondary | ICD-10-CM | POA: Diagnosis present

## 2015-08-20 DIAGNOSIS — Z955 Presence of coronary angioplasty implant and graft: Secondary | ICD-10-CM

## 2015-08-20 DIAGNOSIS — Z7984 Long term (current) use of oral hypoglycemic drugs: Secondary | ICD-10-CM

## 2015-08-20 DIAGNOSIS — Z9842 Cataract extraction status, left eye: Secondary | ICD-10-CM

## 2015-08-20 DIAGNOSIS — Z720 Tobacco use: Secondary | ICD-10-CM | POA: Diagnosis not present

## 2015-08-20 DIAGNOSIS — M79605 Pain in left leg: Secondary | ICD-10-CM | POA: Diagnosis not present

## 2015-08-20 DIAGNOSIS — Z9841 Cataract extraction status, right eye: Secondary | ICD-10-CM

## 2015-08-20 DIAGNOSIS — G8194 Hemiplegia, unspecified affecting left nondominant side: Secondary | ICD-10-CM | POA: Diagnosis present

## 2015-08-20 DIAGNOSIS — E785 Hyperlipidemia, unspecified: Secondary | ICD-10-CM | POA: Diagnosis present

## 2015-08-20 DIAGNOSIS — K219 Gastro-esophageal reflux disease without esophagitis: Secondary | ICD-10-CM | POA: Diagnosis present

## 2015-08-20 LAB — CBC WITH DIFFERENTIAL/PLATELET
Basophils Absolute: 0 10*3/uL (ref 0.0–0.1)
Basophils Relative: 0 %
Eosinophils Absolute: 0.2 10*3/uL (ref 0.0–0.7)
Eosinophils Relative: 3 %
HCT: 33.7 % — ABNORMAL LOW (ref 36.0–46.0)
Hemoglobin: 11.2 g/dL — ABNORMAL LOW (ref 12.0–15.0)
Lymphocytes Relative: 33 %
Lymphs Abs: 2 10*3/uL (ref 0.7–4.0)
MCH: 29.6 pg (ref 26.0–34.0)
MCHC: 33.2 g/dL (ref 30.0–36.0)
MCV: 89.2 fL (ref 78.0–100.0)
Monocytes Absolute: 0.7 10*3/uL (ref 0.1–1.0)
Monocytes Relative: 11 %
Neutro Abs: 3.2 10*3/uL (ref 1.7–7.7)
Neutrophils Relative %: 53 %
Platelets: 235 10*3/uL (ref 150–400)
RBC: 3.78 MIL/uL — ABNORMAL LOW (ref 3.87–5.11)
RDW: 13.9 % (ref 11.5–15.5)
WBC: 6.1 10*3/uL (ref 4.0–10.5)

## 2015-08-20 LAB — URINE MICROSCOPIC-ADD ON

## 2015-08-20 LAB — BASIC METABOLIC PANEL
Anion gap: 13 (ref 5–15)
BUN: 26 mg/dL — ABNORMAL HIGH (ref 6–20)
CO2: 23 mmol/L (ref 22–32)
Calcium: 9.6 mg/dL (ref 8.9–10.3)
Chloride: 101 mmol/L (ref 101–111)
Creatinine, Ser: 1.14 mg/dL — ABNORMAL HIGH (ref 0.44–1.00)
GFR calc Af Amer: 48 mL/min — ABNORMAL LOW (ref >60)
GFR calc non Af Amer: 42 mL/min — ABNORMAL LOW (ref >60)
Glucose, Bld: 186 mg/dL — ABNORMAL HIGH (ref 65–99)
Potassium: 4.2 mmol/L (ref 3.5–5.1)
Sodium: 137 mmol/L (ref 135–145)

## 2015-08-20 LAB — URINALYSIS, ROUTINE W REFLEX MICROSCOPIC
Bilirubin Urine: NEGATIVE
Glucose, UA: NEGATIVE mg/dL
Ketones, ur: NEGATIVE mg/dL
Nitrite: NEGATIVE
Protein, ur: NEGATIVE mg/dL
Specific Gravity, Urine: 1.02 (ref 1.005–1.030)
Urobilinogen, UA: 0.2 mg/dL (ref 0.0–1.0)
pH: 6 (ref 5.0–8.0)

## 2015-08-20 NOTE — ED Notes (Signed)
NIHSS 4, please see NIHSS documentation on this

## 2015-08-20 NOTE — ED Notes (Signed)
Patient with noted decline since Friday.  She was seen here on yesterday because of weakness and slurred speech.  Patient was sent home.  Today she continues to have weakness and unable to walk.  Patient fell early this morning as well.  Family concerned that she may have injured herself.  Patient family also reports her left side seems weaker today than yesterday.

## 2015-08-20 NOTE — Consult Note (Signed)
Admission H&P    Chief Complaint: New onset left-sided weakness.  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 Onset of weakness was on the afternoon of 08/18/2015. She has new onset dysarthria and mild left facial droop. She's had no swallowing difficulty. She was seen in emergency room yesterday. A CT scan of her head was obtained which was unremarkable. She underwent workup for TIA one month ago which was unremarkable. She has been on aspirin daily. MRI tonight showed acute nonhemorrhagic infarction involving the posterior right lentiform nucleus extending superiorly to the centrum semiovale. NIH stroke score was 3.  LSN: 08/18/2015 tPA Given: No: Beyond time under for treatment consideration mRankin:  Past Medical History  Diagnosis Date  . Arrhythmia     paroxysmal atrial fibrillation  . Coronary artery disease   . Hypertension   . Hyperlipidemia   . Edema   . GERD (gastroesophageal reflux disease)   . Anemia B twelve deficiency   . Dizziness - giddy   . Abdominal pain   . Iron deficiency anemia, unspecified   . Pernicious anemia   . Thoracic or lumbosacral neuritis or radiculitis, unspecified   . Symptomatic menopausal or female climacteric states   . Lumbago   . Benign neoplasm of colon   . Allergic rhinitis, cause unspecified   . Type II diabetes mellitus ([place])   . Degeneration of lumbar or lumbosacral intervertebral disc   . Arthritis     "arms and hands" (06/30/2013)    Past Surgical History  Procedure Laterality Date  . Back surgery    . Cholecystectomy    . Appendectomy  1974  . Abdominal hysterectomy  1974  . Carpal tunnel release Bilateral 1970's  . Cataract extraction, bilateral Bilateral   . Cardiac catheterization    . Coronary angioplasty with stent placement      "1"  (06/30/2013  . Lumbar disc surgery      Family History  Problem Relation Age of Onset  . Breast cancer Sister     "behind heart"  . Colon cancer Sister   . Colon cancer Sister    Social History:  reports that she has never smoked. Her smokeless tobacco use includes Snuff. She reports that she does not drink alcohol or use illicit drugs.  Allergies:  Allergies  Allergen Reactions  . Penicillins Swelling    REACTION: swelling of joints  . Sulfonamide Derivatives Other (See Comments)    REACTION: "like my head was full of water"  . Albumin (Human) Other (See Comments)    Doesn't remember   . Clindamycin Other (See Comments)    Doesn't remember   . Iodides Hives  . Iodinated Diagnostic Agents Hives  . Sulfa Antibiotics Hives  . Celecoxib Itching and Rash  . Erythromycin Itching and Rash  . Nitrofuran Derivatives Rash  . Nitrofurantoin Itching and Rash  . Piroxicam Other (See Comments)    unknown  . Povidone-Iodine Itching and Rash    Medications: Patient's preadmission medications were reviewed by me.  ROS: History obtained from patient and patient's daughters.  General ROS: negative for - chills, fatigue, fever, night sweats, weight gain or weight loss Psychological ROS: Mild dementia Ophthalmic ROS: negative for - blurry vision, double vision, eye pain or loss of vision ENT ROS: negative for - epistaxis, nasal discharge, oral lesions, sore throat, tinnitus or vertigo  Allergy and Immunology ROS: negative for - hives or itchy/watery eyes Hematological and Lymphatic ROS: negative for - bleeding problems, bruising or swollen lymph nodes Endocrine ROS: negative for - galactorrhea, hair pattern changes, polydipsia/polyuria or temperature intolerance Respiratory ROS: negative for - cough, hemoptysis, shortness of breath or wheezing Cardiovascular ROS: negative for - chest pain, dyspnea on exertion, edema or irregular heartbeat Gastrointestinal ROS: negative for - abdominal  pain, diarrhea, hematemesis, nausea/vomiting or stool incontinence Genito-Urinary ROS: negative for - dysuria, hematuria, incontinence or urinary frequency/urgency Musculoskeletal ROS: negative for - joint swelling or muscular weakness Neurological ROS: as noted in HPI Dermatological ROS: negative for rash and skin lesion changes  Physical Examination: Blood pressure 150/46, pulse 71, temperature 97.7 F (36.5 C), temperature source Oral, resp. rate 16, weight 50.803 kg (112 lb), SpO2 100 %.  HEENT-  Normocephalic, no lesions, without obvious abnormality.  Normal external eye and conjunctiva.  Normal TM's bilaterally.  Normal auditory canals and external ears. Normal external nose, mucus membranes and septum.  Normal pharynx. Neck supple with no masses, nodes, nodules or enlargement. Cardiovascular - regular rate and rhythm, S1, S2 normal, no murmur, click, rub or gallop Lungs - chest clear, no wheezing, rales, normal symmetric air entry Abdomen - soft, non-tender; bowel sounds normal; no masses,  no organomegaly Extremities - no joint deformities, effusion, or inflammation and no edema  Neurologic Examination: Mental Status: Alert, oriented, thought content appropriate.  Speech slightly slurred without evidence of aphasia. Able to follow commands without difficulty. Cranial Nerves: II-Visual fields were normal. III/IV/VI-Pupils were equal and reacted normally to light. Extraocular movements were full and conjugate.    V/VII-no facial numbness; mild left lower facial weakness. VIII-normal. X-mild dysarthria. XI: trapezius strength/neck flexion strength normal bilaterally XII-midline tongue extension with normal strength. Motor: Mild drift of left upper extremity; motor exam otherwise unremarkable Sensory: Normal throughout. Deep Tendon Reflexes: 1+ and symmetric. Plantars: Mute on the left and flexor on the right Cerebellar: Normal finger-to-nose testing. Carotid auscultation:  Normal  Results for orders placed or performed during the hospital encounter of 08/20/15 (from the past 48 hour(s))  Basic metabolic panel     Status: Abnormal   Collection Time: 08/20/15  4:25 PM  Result Value Ref Range   Sodium 137 135 - 145 mmol/L   Potassium 4.2 3.5 - 5.1 mmol/L   Chloride 101 101 - 111 mmol/L   CO2 23 22 - 32 mmol/L   Glucose, Bld 186 (H) 65 - 99 mg/dL   BUN 26 (H) 6 - 20 mg/dL   Creatinine, Ser 1.14 (H) 0.44 - 1.00 mg/dL   Calcium 9.6 8.9 - 10.3 mg/dL   GFR calc non Af Amer 42 (L) >60 mL/min   GFR calc Af Amer 48 (L) >60 mL/min    Comment: (NOTE) The eGFR has been calculated using the CKD EPI equation. This calculation has not been validated in all clinical situations. eGFR's persistently <60 mL/min signify possible Chronic Kidney Disease.    Anion gap 13 5 - 15  CBC with Differential     Status: Abnormal   Collection Time: 08/20/15  4:25 PM  Result Value Ref Range   WBC 6.1 4.0 - 10.5 K/uL   RBC 3.78 (L) 3.87 - 5.11 MIL/uL   Hemoglobin 11.2 (L) 12.0 - 15.0 g/dL   HCT 33.7 (L) 36.0 - 46.0 %   MCV 89.2 78.0 - 100.0 fL   MCH 29.6 26.0 - 34.0 pg   MCHC 33.2 30.0 - 36.0 g/dL  RDW 13.9 11.5 - 15.5 %   Platelets 235 150 - 400 K/uL   Neutrophils Relative % 53 %   Neutro Abs 3.2 1.7 - 7.7 K/uL   Lymphocytes Relative 33 %   Lymphs Abs 2.0 0.7 - 4.0 K/uL   Monocytes Relative 11 %   Monocytes Absolute 0.7 0.1 - 1.0 K/uL   Eosinophils Relative 3 %   Eosinophils Absolute 0.2 0.0 - 0.7 K/uL   Basophils Relative 0 %   Basophils Absolute 0.0 0.0 - 0.1 K/uL  Urinalysis, Routine w reflex microscopic     Status: Abnormal   Collection Time: 08/20/15  5:17 PM  Result Value Ref Range   Color, Urine YELLOW YELLOW   APPearance CLOUDY (A) CLEAR   Specific Gravity, Urine 1.020 1.005 - 1.030   pH 6.0 5.0 - 8.0   Glucose, UA NEGATIVE NEGATIVE mg/dL   Hgb urine dipstick TRACE (A) NEGATIVE   Bilirubin Urine NEGATIVE NEGATIVE   Ketones, ur NEGATIVE NEGATIVE mg/dL    Protein, ur NEGATIVE NEGATIVE mg/dL   Urobilinogen, UA 0.2 0.0 - 1.0 mg/dL   Nitrite NEGATIVE NEGATIVE   Leukocytes, UA SMALL (A) NEGATIVE  Urine microscopic-add on     Status: None   Collection Time: 08/20/15  5:17 PM  Result Value Ref Range   WBC, UA 11-20 <3 WBC/hpf   Ct Head Wo Contrast  08/19/2015   CLINICAL DATA:  Slurred speech. Gait disturbance. Symptoms began 1,700 yesterday.  EXAM: CT HEAD WITHOUT CONTRAST  TECHNIQUE: Contiguous axial images were obtained from the base of the skull through the vertex without intravenous contrast.  COMPARISON:  07/22/2015 CT.  MRI 07/12/2015.  FINDINGS: The brain shows mild generalized atrophy. There are mild chronic small-vessel ischemic changes of the hemispheric deep white matter. There is focal encephalomalacia at the right temporal tip. No CT evidence of acute infarction. No mass lesion, hemorrhage, hydrocephalus or extra-axial collection. The calvarium is unremarkable. Sinuses are clear.  IMPRESSION: No acute finding by CT. Atrophy and mild chronic small-vessel ischemic change. Chronic encephalomalacia at the right temporal tip. Atherosclerosis of the major vessels at the base of the brain.   Electronically Signed   By: Nelson Chimes M.D.   On: 08/19/2015 09:24   Mr Brain Wo Contrast (neuro Protocol)  08/20/2015   CLINICAL DATA:  Left-sided weakness. Slurred speech. Abnormal balance.  EXAM: MRI HEAD WITHOUT CONTRAST  TECHNIQUE: Multiplanar, multiecho pulse sequences of the brain and surrounding structures were obtained without intravenous contrast.  COMPARISON:  CT head 08/19/2015.  MRI brain 07/12/2015.  FINDINGS: Diffusion-weighted images demonstrate an acute nonhemorrhagic infarct involving the posterior right lentiform nucleus extending superiorly into the centrum semi bowel I. T2 changes are associated with this infarct. Mild periventricular T2 changes are again seen bilaterally. The ventricles are proportionate to the degree of atrophy. No  significant extra-axial fluid collection is present.  The inner ear structures are normally formed. T2 changes are again noted in the anterior pons and medulla. Flow is present in the major intracranial arteries. Bilateral lens replacements are noted. The globes and orbits are otherwise intact.  Mild mucosal thickening is present posterior ethmoid air cells bilaterally. There is some mucosal thickening in the lateral sphenoid sinuses bilaterally. The mastoid air cells are clear.  IMPRESSION: 1. Acute nonhemorrhagic infarct involving the posterior right lentiform nucleus extending superiorly to the centrum semi ovale. 2. Otherwise stable periventricular T2 changes bilaterally a moderate atrophy. 3. Mild sinus disease. These results were called by telephone at the  time of interpretation on 08/20/2015 at 8:49 pm to Dr. Apolonio Schneiders LITTLE , who verbally acknowledged these results.   Electronically Signed   By: San Morelle M.D.   On: 08/20/2015 20:50   Dg Hip Unilat With Pelvis 2-3 Views Left  08/20/2015   CLINICAL DATA:  81 year old female with history of fall complaining of pain in the left leg for the past week.  EXAM: DG HIP (WITH OR WITHOUT PELVIS) 2-3V LEFT  COMPARISON:  No priors.  FINDINGS: AP view of the pelvis demonstrates no acute displaced fracture of the bony pelvic ring. AP and lateral views of the left hip demonstrate no definite acute displaced fracture. Left femoral head is properly located in the acetabulum. There is joint space narrowing, subchondral sclerosis, subchondral cyst formation and osteophyte formation in the hip joints bilaterally (left greater than right), compatible with osteoarthritis.  IMPRESSION: 1. No acute radiographic abnormality of the bony pelvis or the left hip. 2. Degenerative changes of osteoarthritis in the hip joints bilaterally, as above.   Electronically Signed   By: Vinnie Langton M.D.   On: 08/20/2015 18:48    Assessment: 81 y.o. female with multiple risk  factors for stroke presenting with acute right subcortical small vessel ischemic stroke.  Stroke Risk Factors - diabetes mellitus, family history, hyperlipidemia and hypertension  Plan: 1. HgbA1c, fasting lipid panel 2. MRA  of the brain without contrast 3. PT consult, OT consult, Speech consult 4. Carotid dopplers 5. Prophylactic therapy-Antiplatelet med: Aspirin 6. Risk factor modification 7. Telemetry monitoring  C.R. Nicole Kindred, MD [facility] [phone]  08/20/2015, 11:03 PM

## 2015-08-20 NOTE — ED Provider Notes (Signed)
CSN: [id number]     Arrival date & time 08/20/15  1406 History   First MD Initiated Contact with Patient 08/20/15 1459     Chief Complaint  Patient presents with  . Fatigue  . Weakness     (Consider location/radiation/quality/duration/timing/severity/associated sxs/prior Treatment) HPI Comments: 81 year old female with past medical history including CAD, paroxysmal A. fib, type 2 diabetes mellitus, hypertension, hyperlipidemia, pernicious anemia who presents with weakness. History obtained with the assistance of the patient's daughter. Patient was brought here yesterday for evaluation of generalized weakness and slurred speech that began 2 days ago. During her evaluation yesterday, the patient had a negative head CT and neurology was contacted. Patient was discharged home with instructions to continue aspirin for stroke prevention. Daughter reports that her symptoms have persisted since yesterday and she seems to have worsening generalized weakness. Her speech continues to be slurred. She seems to have problems walking because of the weakness. The patient denies any complaints. She denies any visual changes, headache, extremity weakness or numbness, chest pain, shortness of breath, abdominal pain, or urinary symptoms. No recent illness.  The history is provided by the patient and a relative.    Past Medical History  Diagnosis Date  . Arrhythmia     paroxysmal atrial fibrillation  . Coronary artery disease   . Hypertension   . Hyperlipidemia   . Edema   . GERD (gastroesophageal reflux disease)   . Anemia B twelve deficiency   . Dizziness - giddy   . Abdominal pain   . Iron deficiency anemia, unspecified   . Pernicious anemia   . Thoracic or lumbosacral neuritis or radiculitis, unspecified   . Symptomatic menopausal or female climacteric states   . Lumbago   . Benign neoplasm of colon   . Allergic rhinitis, cause unspecified   . Type II diabetes mellitus ([place])   . Degeneration of  lumbar or lumbosacral intervertebral disc   . Arthritis     "arms and hands" (06/30/2013)   Past Surgical History  Procedure Laterality Date  . Back surgery    . Cholecystectomy    . Appendectomy  1974  . Abdominal hysterectomy  1974  . Carpal tunnel release Bilateral 1970's  . Cataract extraction, bilateral Bilateral   . Cardiac catheterization    . Coronary angioplasty with stent placement      "1" (06/30/2013  . Lumbar disc surgery     Family History  Problem Relation Age of Onset  . Breast cancer Sister     "behind heart"  . Colon cancer Sister   . Colon cancer Sister    Social History  Substance Use Topics  . Smoking status: Never Smoker   . Smokeless tobacco: Current User    Types: Snuff  . Alcohol Use: No   OB History    No data available     Review of Systems  10 Systems reviewed and are negative for acute change except as noted in the HPI.   Allergies  Penicillins; Sulfonamide derivatives; Albumin (human); Clindamycin; Iodides; Iodinated diagnostic agents; Sulfa antibiotics; Celecoxib; Erythromycin; Nitrofuran derivatives; Nitrofurantoin; Piroxicam; and Povidone-iodine  Home Medications   Prior to Admission medications   Medication Sig Start Date End Date Taking? Authorizing Provider  aspirin 81 MG EC tablet Take 81 mg by mouth daily.     Yes Historical Provider, MD  calcium carbonate (TUMS - DOSED IN MG ELEMENTAL CALCIUM) 500 MG chewable tablet Chew 1 tablet by mouth as needed for heartburn.   Yes Historical  Provider, MD  donepezil (ARICEPT) 5 MG tablet Take 1 tablet (5 mg total) by mouth at bedtime. 03/27/15  Yes Renato Shin, MD  FERREX 150 150 MG capsule TAKE 1 CAPSULE (150 MG TOTAL) BY MOUTH 2 (TWO) TIMES DAILY. Patient taking differently: TAKE 1 CAPSULE (150 MG TOTAL) BY MOUTH DAILY. 07/18/15  Yes Renato Shin, MD  metFORMIN (GLUCOPHAGE-XR) 500 MG 24 hr tablet TAKE ONE TABLET BY MOUTH TWICE DAILY 07/18/15  Yes Renato Shin, MD  metoprolol succinate  (TOPROL-XL) 50 MG 24 hr tablet TAKE 1 TABLET (50 MG TOTAL) BY MOUTH DAILY. TAKE WITH OR IMMEDIATELY FOLLOWING A MEAL. 06/02/15  Yes Deboraha Sprang, MD  triamcinolone cream (KENALOG) 0.1 % Apply 1 application topically 4 (four) times daily. As needed for rash 03/27/15  Yes Renato Shin, MD  triamterene-hydrochlorothiazide (MAXZIDE-25) 37.5-25 MG tablet 1/2 tab daily Patient taking differently: Take 0.5 tablets by mouth daily. 1/2 tab daily 08/09/15  Yes Renato Shin, MD  diclofenac sodium (VOLTAREN) 1 % GEL Apply 4 g topically 4 (four) times daily. As needed for pain 02/24/15   Renato Shin, MD  nitroGLYCERIN (NITROSTAT) 0.4 MG SL tablet Place 0.4 mg under the tongue every 5 (five) minutes as needed.      Historical Provider, MD  omeprazole (PRILOSEC) 40 MG capsule Take 40 mg by mouth daily as needed (heartburn).  05/22/15   Historical Provider, MD   BP 139/55 mmHg  Pulse 65  Temp(Src) 97.7 F (36.5 C) (Oral)  Resp 16  Wt 112 lb (50.803 kg)  SpO2 98% Physical Exam  Constitutional: She is oriented to person, place, and time. No distress.  Thin, frail appearing elderly woman, Awake, alert  HENT:  Head: Normocephalic and atraumatic.  Moist mucous membranes  Eyes: Conjunctivae and EOM are normal. Pupils are equal, round, and reactive to light.  Neck: Neck supple.  Cardiovascular: Normal rate, regular rhythm and normal heart sounds.   No murmur heard. Pulmonary/Chest: Effort normal and breath sounds normal. No respiratory distress.  Abdominal: Soft. Bowel sounds are normal. She exhibits no distension. There is no tenderness.  Musculoskeletal: She exhibits no edema.  Neurological: She is alert and oriented to person, place, and time. She has normal reflexes. No cranial nerve deficit. She exhibits normal muscle tone.  Slurred speech but no word finding difficulty; 5/5 strength and normal sensation throughout; abnormal finger to nose testing b/l; no pronator drift  Skin: Skin is warm and dry.   Psychiatric: She has a normal mood and affect. Judgment and thought content normal.  Nursing note and vitals reviewed.   ED Course  Procedures (including critical care time) Labs Review Labs Reviewed  BASIC METABOLIC PANEL - Abnormal; Notable for the following:    Glucose, Bld 186 (*)    BUN 26 (*)    Creatinine, Ser 1.14 (*)    GFR calc non Af Amer 42 (*)    GFR calc Af Amer 48 (*)    All other components within normal limits  CBC WITH DIFFERENTIAL/PLATELET - Abnormal; Notable for the following:    RBC 3.78 (*)    Hemoglobin 11.2 (*)    HCT 33.7 (*)    All other components within normal limits  URINALYSIS, ROUTINE W REFLEX MICROSCOPIC (NOT AT [facility]) - Abnormal; Notable for the following:    APPearance CLOUDY (*)    Hgb urine dipstick TRACE (*)    Leukocytes, UA SMALL (*)    All other components within normal limits  URINE CULTURE  URINE MICROSCOPIC-ADD  ON    Imaging Review Ct Head Wo Contrast  08/19/2015   CLINICAL DATA:  Slurred speech. Gait disturbance. Symptoms began 1,700 yesterday.  EXAM: CT HEAD WITHOUT CONTRAST  TECHNIQUE: Contiguous axial images were obtained from the base of the skull through the vertex without intravenous contrast.  COMPARISON:  07/22/2015 CT.  MRI 07/12/2015.  FINDINGS: The brain shows mild generalized atrophy. There are mild chronic small-vessel ischemic changes of the hemispheric deep white matter. There is focal encephalomalacia at the right temporal tip. No CT evidence of acute infarction. No mass lesion, hemorrhage, hydrocephalus or extra-axial collection. The calvarium is unremarkable. Sinuses are clear.  IMPRESSION: No acute finding by CT. Atrophy and mild chronic small-vessel ischemic change. Chronic encephalomalacia at the right temporal tip. Atherosclerosis of the major vessels at the base of the brain.   Electronically Signed   By: Nelson Chimes M.D.   On: 08/19/2015 09:24   Mr Brain Wo Contrast (neuro Protocol)  08/20/2015   CLINICAL DATA:   Left-sided weakness. Slurred speech. Abnormal balance.  EXAM: MRI HEAD WITHOUT CONTRAST  TECHNIQUE: Multiplanar, multiecho pulse sequences of the brain and surrounding structures were obtained without intravenous contrast.  COMPARISON:  CT head 08/19/2015.  MRI brain 07/12/2015.  FINDINGS: Diffusion-weighted images demonstrate an acute nonhemorrhagic infarct involving the posterior right lentiform nucleus extending superiorly into the centrum semi bowel I. T2 changes are associated with this infarct. Mild periventricular T2 changes are again seen bilaterally. The ventricles are proportionate to the degree of atrophy. No significant extra-axial fluid collection is present.  The inner ear structures are normally formed. T2 changes are again noted in the anterior pons and medulla. Flow is present in the major intracranial arteries. Bilateral lens replacements are noted. The globes and orbits are otherwise intact.  Mild mucosal thickening is present posterior ethmoid air cells bilaterally. There is some mucosal thickening in the lateral sphenoid sinuses bilaterally. The mastoid air cells are clear.  IMPRESSION: 1. Acute nonhemorrhagic infarct involving the posterior right lentiform nucleus extending superiorly to the centrum semi ovale. 2. Otherwise stable periventricular T2 changes bilaterally a moderate atrophy. 3. Mild sinus disease. These results were called by telephone at the time of interpretation on 08/20/2015 at 8:49 pm to Dr. Theotis Burrow , who verbally acknowledged these results.   Electronically Signed   By: San Morelle M.D.   On: 08/20/2015 20:50   Dg Hip Unilat With Pelvis 2-3 Views Left  08/20/2015   CLINICAL DATA:  81 year old female with history of fall complaining of pain in the left leg for the past week.  EXAM: DG HIP (WITH OR WITHOUT PELVIS) 2-3V LEFT  COMPARISON:  No priors.  FINDINGS: AP view of the pelvis demonstrates no acute displaced fracture of the bony pelvic ring. AP and lateral  views of the left hip demonstrate no definite acute displaced fracture. Left femoral head is properly located in the acetabulum. There is joint space narrowing, subchondral sclerosis, subchondral cyst formation and osteophyte formation in the hip joints bilaterally (left greater than right), compatible with osteoarthritis.  IMPRESSION: 1. No acute radiographic abnormality of the bony pelvis or the left hip. 2. Degenerative changes of osteoarthritis in the hip joints bilaterally, as above.   Electronically Signed   By: Vinnie Langton M.D.   On: 08/20/2015 18:48   I have personally reviewed and evaluated these lab results as part of my medical decision-making.   EKG Interpretation   Date/Time:  Sunday August 20 2015 14:22:48 EDT Ventricular Rate:  82 PR Interval:  200 QRS Duration: 78 QT Interval:  348 QTC Calculation: 406 R Axis:   52 Text Interpretation:  Normal sinus rhythm with sinus arrhythmia  Anteroseptal infarct , age undetermined Abnormal ECG No significant change  since last tracing Confirmed by Reino Lybbert MD, Cloy Cozzens [phone]) on 08/20/2015  5:15:51 PM      MDM   Final diagnoses:  Cerebral infarction due to unspecified mechanism   81 year old female who presents with several days of ongoing generalized weakness as well as slurred speech noted by family. She was evaluated yesterday and workup was reassuring including normal head CT. On arrival, patient was awake, alert, and in no acute distress. Vital signs stable. She had slurred speech and abnormal finger to nose testing bilaterally on exam. No asymmetric strength. Given length of time of her symptoms, we did not call a stroke alert. Obtained above labs, EKG and MRI given ongoing neurologic complaints. Also obtained pelvis and left hip x-ray given her fall.  EKG was unremarkable. Pelvis x-ray negative. Labs showed stable creatinine at 1.14. MRI showed acute ischemic infarct and posterior right basal ganglia. No associated hemorrhage.  I spoke with neurology, Dr. Nicole Kindred, and appreciate his assistance. He has recommended admission to general medicine for further stroke workup with neurology consultation. I discussed the findings and recommendations with the patient and her family. They voiced understanding and agreed with plan. Patient admitted to general medicine for further care.  Sharlett Iles, MD 08/20/15 [phone]

## 2015-08-20 NOTE — ED Notes (Signed)
Spoke with Dr Audie Pinto regarding patient.  No need to call a code stroke at this time.  Patient was seen by neurology on yesterday.

## 2015-08-21 ENCOUNTER — Encounter (HOSPITAL_COMMUNITY): Payer: Self-pay | Admitting: *Deleted

## 2015-08-21 ENCOUNTER — Inpatient Hospital Stay (HOSPITAL_COMMUNITY): Payer: Medicare Other

## 2015-08-21 DIAGNOSIS — I6330 Cerebral infarction due to thrombosis of unspecified cerebral artery: Secondary | ICD-10-CM | POA: Diagnosis not present

## 2015-08-21 DIAGNOSIS — Z90710 Acquired absence of both cervix and uterus: Secondary | ICD-10-CM | POA: Diagnosis not present

## 2015-08-21 DIAGNOSIS — Z79899 Other long term (current) drug therapy: Secondary | ICD-10-CM | POA: Diagnosis not present

## 2015-08-21 DIAGNOSIS — E119 Type 2 diabetes mellitus without complications: Secondary | ICD-10-CM

## 2015-08-21 DIAGNOSIS — E1159 Type 2 diabetes mellitus with other circulatory complications: Secondary | ICD-10-CM

## 2015-08-21 DIAGNOSIS — Z882 Allergy status to sulfonamides status: Secondary | ICD-10-CM | POA: Diagnosis not present

## 2015-08-21 DIAGNOSIS — E1121 Type 2 diabetes mellitus with diabetic nephropathy: Secondary | ICD-10-CM | POA: Diagnosis not present

## 2015-08-21 DIAGNOSIS — I10 Essential (primary) hypertension: Secondary | ICD-10-CM | POA: Diagnosis not present

## 2015-08-21 DIAGNOSIS — Z9842 Cataract extraction status, left eye: Secondary | ICD-10-CM | POA: Diagnosis not present

## 2015-08-21 DIAGNOSIS — Z888 Allergy status to other drugs, medicaments and biological substances status: Secondary | ICD-10-CM | POA: Diagnosis not present

## 2015-08-21 DIAGNOSIS — F419 Anxiety disorder, unspecified: Secondary | ICD-10-CM | POA: Diagnosis not present

## 2015-08-21 DIAGNOSIS — M545 Low back pain: Secondary | ICD-10-CM | POA: Diagnosis not present

## 2015-08-21 DIAGNOSIS — R262 Difficulty in walking, not elsewhere classified: Secondary | ICD-10-CM | POA: Diagnosis not present

## 2015-08-21 DIAGNOSIS — G8194 Hemiplegia, unspecified affecting left nondominant side: Secondary | ICD-10-CM | POA: Diagnosis present

## 2015-08-21 DIAGNOSIS — I480 Paroxysmal atrial fibrillation: Secondary | ICD-10-CM | POA: Diagnosis not present

## 2015-08-21 DIAGNOSIS — M6281 Muscle weakness (generalized): Secondary | ICD-10-CM | POA: Diagnosis not present

## 2015-08-21 DIAGNOSIS — W19XXXA Unspecified fall, initial encounter: Secondary | ICD-10-CM

## 2015-08-21 DIAGNOSIS — Z7984 Long term (current) use of oral hypoglycemic drugs: Secondary | ICD-10-CM | POA: Diagnosis not present

## 2015-08-21 DIAGNOSIS — I69291 Dysphagia following other nontraumatic intracranial hemorrhage: Secondary | ICD-10-CM | POA: Diagnosis not present

## 2015-08-21 DIAGNOSIS — E785 Hyperlipidemia, unspecified: Secondary | ICD-10-CM | POA: Diagnosis not present

## 2015-08-21 DIAGNOSIS — Z91048 Other nonmedicinal substance allergy status: Secondary | ICD-10-CM | POA: Diagnosis not present

## 2015-08-21 DIAGNOSIS — I63411 Cerebral infarction due to embolism of right middle cerebral artery: Secondary | ICD-10-CM | POA: Diagnosis not present

## 2015-08-21 DIAGNOSIS — I639 Cerebral infarction, unspecified: Secondary | ICD-10-CM | POA: Diagnosis present

## 2015-08-21 DIAGNOSIS — E1122 Type 2 diabetes mellitus with diabetic chronic kidney disease: Secondary | ICD-10-CM | POA: Diagnosis not present

## 2015-08-21 DIAGNOSIS — Z955 Presence of coronary angioplasty implant and graft: Secondary | ICD-10-CM | POA: Diagnosis not present

## 2015-08-21 DIAGNOSIS — Z9181 History of falling: Secondary | ICD-10-CM | POA: Diagnosis not present

## 2015-08-21 DIAGNOSIS — Z7982 Long term (current) use of aspirin: Secondary | ICD-10-CM | POA: Diagnosis not present

## 2015-08-21 DIAGNOSIS — R531 Weakness: Secondary | ICD-10-CM | POA: Diagnosis not present

## 2015-08-21 DIAGNOSIS — F0390 Unspecified dementia without behavioral disturbance: Secondary | ICD-10-CM | POA: Diagnosis present

## 2015-08-21 DIAGNOSIS — I6789 Other cerebrovascular disease: Secondary | ICD-10-CM | POA: Diagnosis not present

## 2015-08-21 DIAGNOSIS — R4701 Aphasia: Secondary | ICD-10-CM | POA: Diagnosis present

## 2015-08-21 DIAGNOSIS — K219 Gastro-esophageal reflux disease without esophagitis: Secondary | ICD-10-CM | POA: Diagnosis present

## 2015-08-21 DIAGNOSIS — I672 Cerebral atherosclerosis: Secondary | ICD-10-CM | POA: Diagnosis present

## 2015-08-21 DIAGNOSIS — I6329 Cerebral infarction due to unspecified occlusion or stenosis of other precerebral arteries: Secondary | ICD-10-CM | POA: Diagnosis not present

## 2015-08-21 DIAGNOSIS — R41841 Cognitive communication deficit: Secondary | ICD-10-CM | POA: Diagnosis not present

## 2015-08-21 DIAGNOSIS — Z880 Allergy status to penicillin: Secondary | ICD-10-CM | POA: Diagnosis not present

## 2015-08-21 DIAGNOSIS — I69352 Hemiplegia and hemiparesis following cerebral infarction affecting left dominant side: Secondary | ICD-10-CM | POA: Diagnosis not present

## 2015-08-21 DIAGNOSIS — I471 Supraventricular tachycardia: Secondary | ICD-10-CM | POA: Diagnosis not present

## 2015-08-21 DIAGNOSIS — R1319 Other dysphagia: Secondary | ICD-10-CM | POA: Diagnosis present

## 2015-08-21 DIAGNOSIS — I2510 Atherosclerotic heart disease of native coronary artery without angina pectoris: Secondary | ICD-10-CM | POA: Diagnosis present

## 2015-08-21 DIAGNOSIS — Z8673 Personal history of transient ischemic attack (TIA), and cerebral infarction without residual deficits: Secondary | ICD-10-CM | POA: Diagnosis not present

## 2015-08-21 DIAGNOSIS — Z9841 Cataract extraction status, right eye: Secondary | ICD-10-CM | POA: Diagnosis not present

## 2015-08-21 DIAGNOSIS — M6289 Other specified disorders of muscle: Secondary | ICD-10-CM | POA: Diagnosis not present

## 2015-08-21 DIAGNOSIS — D519 Vitamin B12 deficiency anemia, unspecified: Secondary | ICD-10-CM | POA: Diagnosis not present

## 2015-08-21 LAB — GLUCOSE, CAPILLARY
Glucose-Capillary: 137 mg/dL — ABNORMAL HIGH (ref 65–99)
Glucose-Capillary: 123 mg/dL — ABNORMAL HIGH (ref 65–99)
Glucose-Capillary: 90 mg/dL (ref 65–99)
Glucose-Capillary: 121 mg/dL — ABNORMAL HIGH (ref 65–99)

## 2015-08-21 LAB — BASIC METABOLIC PANEL
Anion gap: 14 (ref 5–15)
BUN: 21 mg/dL — ABNORMAL HIGH (ref 6–20)
CO2: 18 mmol/L — ABNORMAL LOW (ref 22–32)
Calcium: 9.2 mg/dL (ref 8.9–10.3)
Chloride: 104 mmol/L (ref 101–111)
Creatinine, Ser: 0.95 mg/dL (ref 0.44–1.00)
GFR calc Af Amer: >60 mL/min (ref >60)
GFR calc non Af Amer: 52 mL/min — ABNORMAL LOW (ref >60)
Glucose, Bld: 147 mg/dL — ABNORMAL HIGH (ref 65–99)
Potassium: 4.3 mmol/L (ref 3.5–5.1)
Sodium: 136 mmol/L (ref 135–145)

## 2015-08-21 LAB — LIPID PANEL
Cholesterol: 227 mg/dL — ABNORMAL HIGH (ref 0–200)
HDL: 55 mg/dL (ref >40)
LDL Cholesterol: 150 mg/dL — ABNORMAL HIGH (ref 0–99)
Total CHOL/HDL Ratio: 4.1 RATIO
Triglycerides: 108 mg/dL (ref <150)
VLDL: 22 mg/dL (ref 0–40)

## 2015-08-21 LAB — URINE CULTURE: Special Requests: NORMAL

## 2015-08-21 LAB — CBC
HCT: 32.6 % — ABNORMAL LOW (ref 36.0–46.0)
Hemoglobin: 10.6 g/dL — ABNORMAL LOW (ref 12.0–15.0)
MCH: 28.8 pg (ref 26.0–34.0)
MCHC: 32.5 g/dL (ref 30.0–36.0)
MCV: 88.6 fL (ref 78.0–100.0)
Platelets: 214 10*3/uL (ref 150–400)
RBC: 3.68 MIL/uL — ABNORMAL LOW (ref 3.87–5.11)
RDW: 13.8 % (ref 11.5–15.5)
WBC: 4.5 10*3/uL (ref 4.0–10.5)

## 2015-08-21 MED ORDER — ACETAMINOPHEN 325 MG PO TABS
650.0000 mg | ORAL_TABLET | ORAL | Status: DC | PRN
  Administered 2015-08-21: 650 mg via ORAL
  Filled 2015-08-21: qty 2

## 2015-08-21 MED ORDER — ATORVASTATIN CALCIUM 40 MG PO TABS
40.0000 mg | ORAL_TABLET | Freq: Every day | ORAL | Status: DC
  Administered 2015-08-21 – 2015-08-23 (×3): 40 mg via ORAL
  Filled 2015-08-21 (×3): qty 1

## 2015-08-21 MED ORDER — LORAZEPAM 2 MG/ML IJ SOLN
0.5000 mg | INTRAMUSCULAR | Status: DC | PRN
  Filled 2015-08-21: qty 1

## 2015-08-21 MED ORDER — ACETAMINOPHEN 650 MG RE SUPP: 650.0000 mg | RECTAL | Status: DC | PRN

## 2015-08-21 MED ORDER — DONEPEZIL HCL 5 MG PO TABS
5.0000 mg | ORAL_TABLET | Freq: Every day | ORAL | Status: DC
  Administered 2015-08-21 – 2015-08-23 (×4): 5 mg via ORAL
  Filled 2015-08-21 (×4): qty 1

## 2015-08-21 MED ORDER — ASPIRIN EC 325 MG PO TBEC
325.0000 mg | DELAYED_RELEASE_TABLET | Freq: Every day | ORAL | Status: DC
Start: 2015-08-21 — End: 2015-08-22
  Administered 2015-08-21 – 2015-08-22 (×2): 325 mg via ORAL
  Filled 2015-08-21 (×2): qty 1

## 2015-08-21 MED ORDER — ASPIRIN EC 81 MG PO TBEC: 81.0000 mg | DELAYED_RELEASE_TABLET | Freq: Every day | ORAL | Status: DC

## 2015-08-21 MED ORDER — POLYSACCHARIDE IRON COMPLEX 150 MG PO CAPS
150.0000 mg | ORAL_CAPSULE | Freq: Every day | ORAL | Status: DC
  Administered 2015-08-21 – 2015-08-23 (×3): 150 mg via ORAL
  Filled 2015-08-21 (×5): qty 1

## 2015-08-21 MED ORDER — INSULIN ASPART 100 UNIT/ML ~~LOC~~ SOLN
0.0000 [IU] | Freq: Three times a day (TID) | SUBCUTANEOUS | Status: DC
  Administered 2015-08-21 – 2015-08-22 (×3): 1 [IU] via SUBCUTANEOUS
  Administered 2015-08-22 – 2015-08-23 (×3): 2 [IU] via SUBCUTANEOUS
  Administered 2015-08-23 (×2): 1 [IU] via SUBCUTANEOUS
  Administered 2015-08-24: 2 [IU] via SUBCUTANEOUS
  Administered 2015-08-24: 1 [IU] via SUBCUTANEOUS

## 2015-08-21 MED ORDER — ENOXAPARIN SODIUM 40 MG/0.4ML ~~LOC~~ SOLN
40.0000 mg | SUBCUTANEOUS | Status: DC
  Administered 2015-08-21: 40 mg via SUBCUTANEOUS
  Filled 2015-08-21 (×2): qty 0.4

## 2015-08-21 MED ORDER — RISPERIDONE 0.5 MG PO TABS
0.5000 mg | ORAL_TABLET | Freq: Every day | ORAL | Status: DC
  Administered 2015-08-21 – 2015-08-23 (×3): 0.5 mg via ORAL
  Filled 2015-08-21 (×3): qty 1

## 2015-08-21 MED ORDER — PANTOPRAZOLE SODIUM 40 MG PO TBEC
40.0000 mg | DELAYED_RELEASE_TABLET | Freq: Every day | ORAL | Status: DC
  Administered 2015-08-21 – 2015-08-24 (×4): 40 mg via ORAL
  Filled 2015-08-21 (×4): qty 1

## 2015-08-21 MED ORDER — SODIUM CHLORIDE 0.9 % IV SOLN
INTRAVENOUS | Status: DC
  Administered 2015-08-21: 75 mL/h via INTRAVENOUS

## 2015-08-21 MED ORDER — ASPIRIN 81 MG PO TBEC: 81.0000 mg | DELAYED_RELEASE_TABLET | Freq: Every day | ORAL | Status: DC

## 2015-08-21 MED ORDER — STROKE: EARLY STAGES OF RECOVERY BOOK
Freq: Once | Status: AC
  Administered 2015-08-21: 1

## 2015-08-21 MED ORDER — METOPROLOL SUCCINATE ER 25 MG PO TB24
50.0000 mg | ORAL_TABLET | Freq: Every day | ORAL | Status: DC
  Administered 2015-08-21 – 2015-08-23 (×3): 50 mg via ORAL
  Filled 2015-08-21 (×3): qty 2

## 2015-08-21 NOTE — Progress Notes (Signed)
PATIENT DETAILS Name: [name] Age: 81 y.o. Sex: female Date of Birth: 11/11/1925 Admit Date: 08/20/2015 Admitting Physician Lily Kocher, MD BTD:[id number], Hilliard Clark, MD  Subjective: Awake-mostly alert-daughter at bedside. Speech seems to have improved somewhat compared to admission.  Assessment/Plan: Principal Problem: Acute right basal ganglia CVA: Nonfocal exam, speech slowly improving.  Recent 2-D echocardiogram (07/12/15) negative for significant embolic source, recent carotid Doppler (07/14/15) negative for significant stenosis. Does have history of atrial fibrillation-reviewed outpatient cardiology note on October 2015-deemed not to be a candidate for anticoagulation. LDL 150 (goal <70), A1c pending. Continue aspirin-dose increased to 325, continue Lipitor-dose increased to 40 mg. Will await further recommendations from neurology.   Active Problems: Dyslipidemia: See above  Diabetes mellitus type 2, controlled: CBGs stable, await A1c. Continue SSI-resume metformin on discharge  History of paroxysmal atrial fibrillation: Sinus rhythm-difficult situation-now with acute CVA-reviewed prior outpatient cardiology note-not deemed to be a candidate for anticoagulation due to fall risk. Await PT eval. Continue aspirin for now.  Hypertension: Controlled-continue metoprolol  Dementia: Pleasantly confused-suspect at baseline. Continue receptive.  History of CAD: Last PCI in 2011-continue aspirin, statin and beta blocker. No chest pain or shortness of breath.    Disposition: Remain inpatient-await recommendations from neurology and PT  Antimicrobial agents  See below  Anti-infectives    None      DVT Prophylaxis: Prophylactic Lovenox   Code Status: Full code  Family Communication Daughter at bedside  Procedures: None  CONSULTS:  neurology  Time spent 25 minutes-Greater than 50% of this time was spent in counseling, explanation of diagnosis, planning  of further management, and coordination of care.  MEDICATIONS: Scheduled Meds: . aspirin EC  325 mg Oral Daily  . atorvastatin  40 mg Oral q1800  . donepezil  5 mg Oral QHS  . insulin aspart  0-9 Units Subcutaneous TID WC  . iron polysaccharides  150 mg Oral Daily  . metoprolol succinate  50 mg Oral Daily  . pantoprazole  40 mg Oral Daily   Continuous Infusions:  PRN Meds:.acetaminophen **OR** acetaminophen    PHYSICAL EXAM: Vital signs in last 24 hours: Filed Vitals:   08/21/15 0400 08/21/15 0600 08/21/15 0800 08/21/15 1000  BP: 157/62 149/61 141/49 142/53  Pulse:      Temp: 98.1 F (36.7 C) 97.7 F (36.5 C) 98.6 F (37 C) 98.6 F (37 C)  TempSrc: Oral Oral Oral Oral  Resp: 19 20 18    Height:      Weight:      SpO2:  99% 99% 100%    Weight change:  Filed Weights   08/20/15 1424 08/20/15 2329  Weight: 50.803 kg (112 lb) 50.077 kg (110 lb 6.4 oz)   Body mass index is 20.19 kg/(m^2).   Gen Exam: Awake and alert with clear speech. Neck: Supple, No JVD.   Chest: B/L Clear.   CVS: S1 S2 Regular, no murmurs.  Abdomen: soft, BS +, non tender, non distended.  Extremities: no edema, lower extremities warm to touch. Neurologic: Non Focal.   Skin: No Rash.   Wounds: N/A.   Intake/Output from previous day: No intake or output data in the 24 hours ending 08/21/15 1120   LAB RESULTS: CBC  Recent Labs Lab 08/19/15 0813 08/20/15 1625 08/21/15 0544  WBC  --  6.1 4.5  HGB 12.6 11.2* 10.6*  HCT 37.0 33.7* 32.6*  PLT  --  235 214  MCV  --  89.2 88.6  MCH  --  29.6 28.8  MCHC  --  33.2 32.5  RDW  --  13.9 13.8  LYMPHSABS  --  2.0  --   MONOABS  --  0.7  --   EOSABS  --  0.2  --   BASOSABS  --  0.0  --     Chemistries   Recent Labs Lab 08/19/15 0813 08/20/15 1625 08/21/15 0544  NA 139 137 136  K 4.5 4.2 4.3  CL 105 101 104  CO2  --  23 18*  GLUCOSE 156* 186* 147*  BUN 31* 26* 21*  CREATININE 1.10* 1.14* 0.95  CALCIUM  --  9.6 9.2     CBG:  Recent Labs Lab 08/21/15 0706  GLUCAP 137*    GFR Estimated Creatinine Clearance: 32.4 mL/min (by C-G formula based on Cr of 0.95).  Coagulation profile No results for input(s): INR, PROTIME in the last 168 hours.  Cardiac Enzymes No results for input(s): CKMB, TROPONINI, MYOGLOBIN in the last 168 hours.  Invalid input(s): CK  Invalid input(s): POCBNP No results for input(s): DDIMER in the last 72 hours. No results for input(s): HGBA1C in the last 72 hours.  Recent Labs  08/21/15 0544  CHOL 227*  HDL 55  LDLCALC 150*  TRIG 108  CHOLHDL 4.1   No results for input(s): TSH, T4TOTAL, T3FREE, THYROIDAB in the last 72 hours.  Invalid input(s): FREET3 No results for input(s): VITAMINB12, FOLATE, FERRITIN, TIBC, IRON, RETICCTPCT in the last 72 hours. No results for input(s): LIPASE, AMYLASE in the last 72 hours.  Urine Studies No results for input(s): UHGB, CRYS in the last 72 hours.  Invalid input(s): UACOL, UAPR, USPG, UPH, UTP, UGL, UKET, UBIL, UNIT, UROB, ULEU, UEPI, UWBC, URBC, UBAC, CAST, UCOM, BILUA  MICROBIOLOGY: Recent Results (from the past 240 hour(s))  Urine culture     Status: None (Preliminary result)   Collection Time: 08/20/15  5:17 PM  Result Value Ref Range Status   Specimen Description URINE, CLEAN CATCH  Final   Special Requests Normal  Final   Culture CULTURE REINCUBATED FOR BETTER GROWTH  Final   Report Status PENDING  Incomplete    RADIOLOGY STUDIES/RESULTS: Ct Head Wo Contrast  08/19/2015   CLINICAL DATA:  Slurred speech. Gait disturbance. Symptoms began 1,700 yesterday.  EXAM: CT HEAD WITHOUT CONTRAST  TECHNIQUE: Contiguous axial images were obtained from the base of the skull through the vertex without intravenous contrast.  COMPARISON:  07/22/2015 CT.  MRI 07/12/2015.  FINDINGS: The brain shows mild generalized atrophy. There are mild chronic small-vessel ischemic changes of the hemispheric deep white matter. There is focal  encephalomalacia at the right temporal tip. No CT evidence of acute infarction. No mass lesion, hemorrhage, hydrocephalus or extra-axial collection. The calvarium is unremarkable. Sinuses are clear.  IMPRESSION: No acute finding by CT. Atrophy and mild chronic small-vessel ischemic change. Chronic encephalomalacia at the right temporal tip. Atherosclerosis of the major vessels at the base of the brain.   Electronically Signed   By: Nelson Chimes M.D.   On: 08/19/2015 09:24   Ct Head Wo Contrast  07/22/2015   CLINICAL DATA:  81 year old female code stroke.  EXAM: CT HEAD WITHOUT CONTRAST  TECHNIQUE: Contiguous axial images were obtained from the base of the skull through the vertex without intravenous contrast.  COMPARISON:  CT dated 07/14/2015  FINDINGS: There is stable prominence of the ventricles and sulci compatible with age-related atrophy. Periventricular and deep white matter hypodensities  represent chronic microvascular ischemic changes. There is no intracranial hemorrhage. No mass effect or midline shift identified.  There is mild mucoperiosteal thickening of the paranasal sinuses with partial opacification of the ethmoid air cells. The mastoid air cells are clear. Linear lucency in the posterior occipital calvarium is similar to prior study. The calvarium is intact.  IMPRESSION: No acute intracranial pathology.  Age-related atrophy and chronic microvascular ischemic disease.  If symptoms persist and there are no contraindications, MRI may provide better evaluation if clinically indicated.  These results were called by telephone at the time of interpretation on 07/22/2015 at 8:00 pm to Dr. Robina Ade, who verbally acknowledged these results.   Electronically Signed   By: Anner Crete M.D.   On: 07/22/2015 20:04   Mr Brain Wo Contrast (neuro Protocol)  08/20/2015   CLINICAL DATA:  Left-sided weakness. Slurred speech. Abnormal balance.  EXAM: MRI HEAD WITHOUT CONTRAST  TECHNIQUE: Multiplanar, multiecho pulse  sequences of the brain and surrounding structures were obtained without intravenous contrast.  COMPARISON:  CT head 08/19/2015.  MRI brain 07/12/2015.  FINDINGS: Diffusion-weighted images demonstrate an acute nonhemorrhagic infarct involving the posterior right lentiform nucleus extending superiorly into the centrum semi bowel I. T2 changes are associated with this infarct. Mild periventricular T2 changes are again seen bilaterally. The ventricles are proportionate to the degree of atrophy. No significant extra-axial fluid collection is present.  The inner ear structures are normally formed. T2 changes are again noted in the anterior pons and medulla. Flow is present in the major intracranial arteries. Bilateral lens replacements are noted. The globes and orbits are otherwise intact.  Mild mucosal thickening is present posterior ethmoid air cells bilaterally. There is some mucosal thickening in the lateral sphenoid sinuses bilaterally. The mastoid air cells are clear.  IMPRESSION: 1. Acute nonhemorrhagic infarct involving the posterior right lentiform nucleus extending superiorly to the centrum semi ovale. 2. Otherwise stable periventricular T2 changes bilaterally a moderate atrophy. 3. Mild sinus disease. These results were called by telephone at the time of interpretation on 08/20/2015 at 8:49 pm to Dr. Theotis Burrow , who verbally acknowledged these results.   Electronically Signed   By: San Morelle M.D.   On: 08/20/2015 20:50   Mr Jodene Nam Head/brain Wo Cm  08/21/2015   CLINICAL DATA:  Initial evaluation for stroke.  EXAM: MRA HEAD WITHOUT CONTRAST  TECHNIQUE: Angiographic images of the Circle of Willis were obtained using MRA technique without intravenous contrast.  COMPARISON:  Prior MRI from 08/20/2015.  FINDINGS: ANTERIOR CIRCULATION:  Motion degraded study.  The distal cervical segments of the internal carotid arteries are patent with antegrade flow. Petrous cavernous, and supraclinoid segments are  patent without focal stenosis. Right A1 segment is hypoplastic. Moderate focal narrowing at the origin of the left A1 segment. Anterior communicating artery grossly normal. Anterior cerebral arteries patent bilaterally.  M1 segments patent without stenosis or occlusion. MCA bifurcations within normal limits. MCA branches symmetric bilaterally. Atheromatous irregularity within the proximal MCA branches. Distal MCA branches not well evaluated on this exam secondary to motion.  POSTERIOR CIRCULATION:  Vertebral arteries are patent to the vertebrobasilar junction. Posterior inferior cerebral arteries not well evaluated on this exam. Basilar artery widely patent. Superior cerebellar arteries patent proximally. There is an apparent focal outpouching arising in the region of the right superior cerebellar artery measuring approximately 3 mm, suspicious for small aneurysm. Left P1 segment widely patent. Right P1 segment is hypoplastic. Prominent right posterior communicating artery. Multi focal irregularity seen within the  posterior cerebral arteries, likely related underlying atheromatous disease. No focal high-grade stenosis. Posterior cerebral arteries are opacified to their distal aspect.  IMPRESSION: 1. Motion degraded study. 2. No focal severe or correctable stenosis identified within the intracranial circulation. 3. Atheromatous irregularity within the MCA and PCA branches bilaterally. 4. 3 mm focal outpouching in the region of the right superior cerebellar artery, suspicious for small aneurysm.   Electronically Signed   By: Jeannine Boga M.D.   On: 08/21/2015 03:55   Dg Hip Unilat With Pelvis 2-3 Views Left  08/20/2015   CLINICAL DATA:  81 year old female with history of fall complaining of pain in the left leg for the past week.  EXAM: DG HIP (WITH OR WITHOUT PELVIS) 2-3V LEFT  COMPARISON:  No priors.  FINDINGS: AP view of the pelvis demonstrates no acute displaced fracture of the bony pelvic ring. AP and  lateral views of the left hip demonstrate no definite acute displaced fracture. Left femoral head is properly located in the acetabulum. There is joint space narrowing, subchondral sclerosis, subchondral cyst formation and osteophyte formation in the hip joints bilaterally (left greater than right), compatible with osteoarthritis.  IMPRESSION: 1. No acute radiographic abnormality of the bony pelvis or the left hip. 2. Degenerative changes of osteoarthritis in the hip joints bilaterally, as above.   Electronically Signed   By: Vinnie Langton M.D.   On: 08/20/2015 18:48    Oren Binet, MD  [facility] Pager:[phone]  If 7PM-7AM, please contact night-coverage [email or web address] Password [email or web address] 08/21/2015, 11:20 AM   LOS: 0 days

## 2015-08-21 NOTE — Progress Notes (Signed)
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 Onset of weakness was on the afternoon of 08/18/2015, time unclear. She has new onset dysarthria and mild left facial droop. She's had no swallowing difficulty. She was seen in emergency room yesterday, 08/19/2015 where a CT scan of her head was obtained which was unremarkable. She was seen by neuro in the ED and was discharged. Of note, she underwent workup for TIA one month ago which was unremarkable. She has been on aspirin daily. MRI 08/20/2015 showed acute nonhemorrhagic infarction involving the posterior right lentiform nucleus extending superiorly to the centrum semiovale. NIH stroke score was 3. Patient was not administered TPA secondary to being beyond time under for treatment consideration. She was admitted for further evaluation and treatment.   SUBJECTIVE (INTERVAL HISTORY) Multiple family members are at the bedside.  Overall they feel her condition is stable this am. They are concerned with her ongoing "passing out spells" with one time stiffness and recurrent hospitalizations. Followed with Dr. Delice Lesch as out pt for 24 hour EEG. She has hx of pAfib not on AC.    OBJECTIVE Temp:  [97.7 F (36.5 C)-98.6 F (37 C)] 98.6 F (37 C) (10/10 1000) Pulse Rate:  [65-84] 71 (10/09 2203) Cardiac Rhythm:  [-] Normal sinus rhythm (10/10 0743) Resp:  [11-26] 18 (10/10 0800) BP: (117-176)/(44-93) 142/53 mmHg (10/10 1000) SpO2:  [95 %-100 %] 100 % (10/10 1000) Weight:  [50.077 kg (110 lb 6.4 oz)-50.803 kg (112 lb)] 50.077 kg (110 lb 6.4 oz) (10/09 2329)  CBC:   Recent Labs Lab 08/20/15 1625 08/21/15 0544  WBC 6.1 4.5  NEUTROABS 3.2  --   HGB 11.2* 10.6*  HCT 33.7* 32.6*  MCV 89.2 88.6  PLT 235 202    Basic Metabolic  Panel:   Recent Labs Lab 08/20/15 1625 08/21/15 0544  NA 137 136  K 4.2 4.3  CL 101 104  CO2 23 18*  GLUCOSE 186* 147*  BUN 26* 21*  CREATININE 1.14* 0.95  CALCIUM 9.6 9.2    Lipid Panel:     Component Value Date/Time   CHOL 227* 08/21/2015 0544   TRIG 108 08/21/2015 0544   TRIG 80 09/04/2006 0837   HDL 55 08/21/2015 0544   CHOLHDL 4.1 08/21/2015 0544   CHOLHDL 3.9 CALC 09/04/2006 0837   VLDL 22 08/21/2015 0544   LDLCALC 150* 08/21/2015 0544   HgbA1c:  Lab Results  Component Value Date   HGBA1C 7.9* 07/13/2015   Urine Drug Screen: No results found for: LABOPIA, COCAINSCRNUR, LABBENZ, AMPHETMU, THCU, LABBARB    IMAGING  Ct Head Wo Contrast 08/19/2015    No acute finding by CT. Atrophy and mild chronic small-vessel ischemic change. Chronic encephalomalacia at the right temporal tip. Atherosclerosis of the major vessels at the base of the brain.     Mr Brain Wo Contrast (neuro Protocol) 08/20/2015   1. Acute nonhemorrhagic infarct involving the posterior right lentiform nucleus extending superiorly to the centrum semi ovale. 2. Otherwise stable periventricular T2 changes bilaterally a moderate atrophy. 3. Mild sinus disease.   Mr Jodene Nam Head/brain Wo Cm 08/21/2015    1. Motion degraded study. 2. No focal severe or correctable stenosis identified within the intracranial circulation. 3. Atheromatous irregularity within the MCA and PCA branches bilaterally. 4. 3 mm focal outpouching in the  region of the right superior cerebellar artery, suspicious for small aneurysm.     CUS 07/14/15 - Bilateral: 1-39% ICA stenosis. Vertebral artery flow is antegrade.  2D echo 07/12/15 -  - Left ventricle: The cavity size was normal. Wall thickness was normal. Systolic function was normal. The estimated ejection fraction was in the range of 55% to 60%. Wall motion was normal; there were no regional wall motion abnormalities. Doppler parameters are consistent with abnormal left  ventricular relaxation (grade 1 diastolic dysfunction).   PHYSICAL EXAM  Temp:  [97.7 F (36.5 C)-98.6 F (37 C)] 97.7 F (36.5 C) (10/10 2108) Pulse Rate:  [57-70] 57 (10/10 2108) Resp:  [16-20] 18 (10/10 2108) BP: (139-166)/(49-116) 166/52 mmHg (10/10 2108) SpO2:  [98 %-100 %] 100 % (10/10 2108)  General - thin built female, well developed, in no apparent distress.  Ophthalmologic - Fundi not visualized due to eye movement.  Cardiovascular - Regular rate and rhythm, no afib rhythm.  Mental Status -  Level of arousal and orientation to place, and person were intact, but not orientated to time. Language including expression, repetition, comprehension was assessed and found intact, but naming 3/4 Fund of Knowledge was assessed and was impaired.  Cranial Nerves II - XII - II - Visual field intact OU. III, IV, VI - Extraocular movements intact. V - Facial sensation intact bilaterally. VII - left facial droop. VIII - Hearing & vestibular intact bilaterally. X - Palate elevates symmetrically. XI - Chin turning & shoulder shrug intact bilaterally. XII - Tongue protrusion intact.  Motor Strength - The patient's strength was normal in all extremities except LUE 3+/5 proximal and 4+/5 distally and pronator drift was present on the left.  Bulk was normal and fasciculations were absent.   Motor Tone - Muscle tone was assessed at the neck and appendages and was normal.  Reflexes - The patient's reflexes were 1+ in all extremities and she had no pathological reflexes.  Sensory - Light touch, temperature/pinprick were assessed and were symmetrical.    Coordination - The patient had normal movements in the hands and feet with no ataxia or dysmetria.  Tremor was absent.  Gait and Station - not tested due to safety concerns   ASSESSMENT/PLAN [name] is a 81 y.o. female with history of history pAfib not on AC, type 2 diabetes mellitus, hypertension, hyperlipidemia,  coronary artery disease, and dementia  presenting with left sided weakness. She did not receive IV t-PA due to delay in arrival.   Stroke:  Right posterior lentiform nucleus infarct either embolic secondary to known atrial fibrillation without AC vs due to small vessel disease.  Resultant  Left facial, LUE monoparesis 3+/5 prox, 4+/5 distal  MRI  Right posterior lentiform nucleus infarct  MRA  Intracranial atherosclerosis, ? small R SCA aneurysm  Carotid Doppler  07/14/2015 No significant stenosis   2D Echo  07/12/2015 No source of embolus, EF 55-60%   LDL 150  HgbA1c 7.9 Sept 2016  Lovenox 40 mg sq daily  for VTE prophylaxis Diet Heart Room service appropriate?: Yes; Fluid consistency:: Thin  aspirin 81 mg orally every day prior to admission, now on aspirin 325 mg orally every day .   Ongoing aggressive stroke risk factor management  Therapy recommendations:  pending   Disposition: pending   Paroxysmal Atrial Fibrillation  Noted in medical record, dx in 1995 per note March 2011 without recurrence  Home anticoagulation:  None  Not an anticoagulation in the past due to fall risk  per cardiology note 03/2015 - family stated that she only had one fall.   Will ask case management about NOAC coverage  Prefer eliquis 2.5 mg bid   Hypertension  Stable  Permissive hypertension (OK if < 220/120) but gradually normalize in 5-7 days  Hyperlipidemia  Home meds:  Not on statin  LDL 150, goal < 70  New lipitor 40 mg daily added  Continue statin at discharge  Diabetes, type II  HgbA1c 7.9 Sept 2016, goal < 7.0  Uncontrolled  Possible seizxure  Followed by Dr. Delice Lesch who will determine dx  adm 8/31-07/15/2015 for aphasia/slurred speech and possible seizures. EEG neg ([facility])  07/22/2015 slurred speech and lethargy, stopped seroquel Doy Mince)  Not on AED yet  Other Stroke Risk Factors  Advanced age  Hx TIAs -   Coronary artery disease  Other Active  Problems  Dementia on aricept  Hospital day # 0  Andrea Hawking, MD PhD Stroke Neurology 08/21/2015 11:50 PM    To contact Stroke Continuity provider, please refer to [email or web address] After hours, contact General Neurology

## 2015-08-21 NOTE — H&P (Signed)
[facility] History and Physical  [name] FHQ:[id number] DOB: Feb 24, 1925 DOA: 08/20/2015  PCP: Renato Shin, MD  Specialists: She has a cardiologist.  Chief Complaint: Slurred speech, gait instability, fall, lethargy  HPI: [name] is a 81 y.o. woman with a history of CAD, HTN, HLD, TIA, and Type 2 DM who was in her baseline state of health until 48 hours ago.  The patient lives with one of her daughters who reports that she developed slurred speech and increased weakness with gait instability, prompting presentation to the ED 08/19/2015.  The patient did not have evidence of acute stroke on head CT at that time.  She was evaluated by neurology, who recommended outpatient followup.  The patient went home and fell in the restroom that same evening.  She did not hit her head, but since then, the daughter reports that she has been sleeping most of the time.  Speech remained slurred.  The patient complained of headache.  The patient was brought back to the ED tonight 08/20/2015, where repeat assessment now shows acute nonhemorrhagic infarct of the right basal ganglia.  Neurology has reassessed the patient.  Hospitalist asked to admit.   Review of Systems: 12 systems reviewed and negative except as stated in HPI  Past Medical History  Diagnosis Date  . Arrhythmia     paroxysmal atrial fibrillation  . Coronary artery disease   . Hypertension   . Hyperlipidemia   . Edema   . GERD (gastroesophageal reflux disease)   . Anemia B twelve deficiency   . Dizziness - giddy   . Abdominal pain   . Iron deficiency anemia, unspecified   . Pernicious anemia   . Thoracic or lumbosacral neuritis or radiculitis, unspecified   . Symptomatic menopausal or female climacteric states   . Lumbago   . Benign neoplasm of colon   . Allergic rhinitis, cause unspecified   . Type II diabetes mellitus ([place])   . Degeneration of lumbar or lumbosacral intervertebral disc   . Arthritis     "arms and  hands" (06/30/2013)   Past Surgical History  Procedure Laterality Date  . Back surgery    . Cholecystectomy    . Appendectomy  1974  . Abdominal hysterectomy  1974  . Carpal tunnel release Bilateral 1970's  . Cataract extraction, bilateral Bilateral   . Cardiac catheterization    . Coronary angioplasty with stent placement      "1" (06/30/2013  . Lumbar disc surgery     Social History:  Social History   Social History Narrative   Patient lives with daughter in a two story home.   Patient has a 10th grade education.   Patient is retired.   Patient is right handed.  No tobacco, EtOH, or illicit drug use.  Allergies  Allergen Reactions  . Penicillins Swelling    REACTION: swelling of joints  . Sulfonamide Derivatives Other (See Comments)    REACTION: "like my head was full of water"  . Albumin (Human) Other (See Comments)    Doesn't remember   . Clindamycin Other (See Comments)    Doesn't remember   . Iodides Hives  . Iodinated Diagnostic Agents Hives  . Sulfa Antibiotics Hives  . Celecoxib Itching and Rash  . Erythromycin Itching and Rash  . Nitrofuran Derivatives Rash  . Nitrofurantoin Itching and Rash  . Piroxicam Other (See Comments)    unknown  . Povidone-Iodine Itching and Rash    Family History  Problem Relation Age of Onset  .  Breast cancer Sister     "behind heart"  . Colon cancer Sister   . Colon cancer Sister   One grandparent, one brother also had CVA  Prior to Admission medications   Medication Sig Start Date End Date Taking? Authorizing Provider  aspirin 81 MG EC tablet Take 81 mg by mouth daily.     Yes Historical Provider, MD  calcium carbonate (TUMS - DOSED IN MG ELEMENTAL CALCIUM) 500 MG chewable tablet Chew 1 tablet by mouth as needed for heartburn.   Yes Historical Provider, MD  donepezil (ARICEPT) 5 MG tablet Take 1 tablet (5 mg total) by mouth at bedtime. 03/27/15  Yes Renato Shin, MD  FERREX 150 150 MG capsule TAKE 1 CAPSULE (150 MG TOTAL)  BY MOUTH 2 (TWO) TIMES DAILY. Patient taking differently: TAKE 1 CAPSULE (150 MG TOTAL) BY MOUTH DAILY. 07/18/15  Yes Renato Shin, MD  metFORMIN (GLUCOPHAGE-XR) 500 MG 24 hr tablet TAKE ONE TABLET BY MOUTH TWICE DAILY 07/18/15  Yes Renato Shin, MD  metoprolol succinate (TOPROL-XL) 50 MG 24 hr tablet TAKE 1 TABLET (50 MG TOTAL) BY MOUTH DAILY. TAKE WITH OR IMMEDIATELY FOLLOWING A MEAL. 06/02/15  Yes Deboraha Sprang, MD  triamcinolone cream (KENALOG) 0.1 % Apply 1 application topically 4 (four) times daily. As needed for rash 03/27/15  Yes Renato Shin, MD  triamterene-hydrochlorothiazide (MAXZIDE-25) 37.5-25 MG tablet 1/2 tab daily Patient taking differently: Take 0.5 tablets by mouth daily. 1/2 tab daily 08/09/15  Yes Renato Shin, MD  diclofenac sodium (VOLTAREN) 1 % GEL Apply 4 g topically 4 (four) times daily. As needed for pain 02/24/15   Renato Shin, MD  nitroGLYCERIN (NITROSTAT) 0.4 MG SL tablet Place 0.4 mg under the tongue every 5 (five) minutes as needed.      Historical Provider, MD  omeprazole (PRILOSEC) 40 MG capsule Take 40 mg by mouth daily as needed (heartburn).  05/22/15   Historical Provider, MD   Physical Exam: Filed Vitals:   08/20/15 2115 08/20/15 2145 08/20/15 2203 08/20/15 2329  BP: 139/55 138/87 150/46 157/44  Pulse: 65 81 71   Temp:    98.5 F (36.9 C)  TempSrc:    Oral  Resp: 16 16  18   Height:    5\' 2"  (1.575 m)  Weight:    50.077 kg (110 lb 6.4 oz)  SpO2: 98% 100% 100% 100%     General:  Sleeping but arousable and following commands  Eyes: PERRL bilaterally  ENT: no nasal drainage  Neck: supple, no carotid bruit  Cardiovascular: NR/RR  Respiratory: Diminished bilaterally  Abdomen: Abdomen soft nontender nondistended  Skin: Pale, dry  Musculoskeletal: Moves all four extremities spontaneously  Psychiatric: Flat affect  Neurologic: Left upper extremity weaker than right, CN grossly intact  Labs on Admission:  Basic Metabolic Panel:  Recent Labs Lab  08/19/15 0813 08/20/15 1625  NA 139 137  K 4.5 4.2  CL 105 101  CO2  --  23  GLUCOSE 156* 186*  BUN 31* 26*  CREATININE 1.10* 1.14*  CALCIUM  --  9.6   CBC:  Recent Labs Lab 08/19/15 0813 08/20/15 1625  WBC  --  6.1  NEUTROABS  --  3.2  HGB 12.6 11.2*  HCT 37.0 33.7*  MCV  --  89.2  PLT  --  235   Radiological Exams on Admission: Ct Head Wo Contrast  08/19/2015   CLINICAL DATA:  Slurred speech. Gait disturbance. Symptoms began 1,700 yesterday.  EXAM: CT HEAD WITHOUT CONTRAST  TECHNIQUE: Contiguous axial images were obtained from the base of the skull through the vertex without intravenous contrast.  COMPARISON:  07/22/2015 CT.  MRI 07/12/2015.  FINDINGS: The brain shows mild generalized atrophy. There are mild chronic small-vessel ischemic changes of the hemispheric deep white matter. There is focal encephalomalacia at the right temporal tip. No CT evidence of acute infarction. No mass lesion, hemorrhage, hydrocephalus or extra-axial collection. The calvarium is unremarkable. Sinuses are clear.  IMPRESSION: No acute finding by CT. Atrophy and mild chronic small-vessel ischemic change. Chronic encephalomalacia at the right temporal tip. Atherosclerosis of the major vessels at the base of the brain.   Electronically Signed   By: Nelson Chimes M.D.   On: 08/19/2015 09:24   Mr Brain Wo Contrast (neuro Protocol)  08/20/2015   CLINICAL DATA:  Left-sided weakness. Slurred speech. Abnormal balance.  EXAM: MRI HEAD WITHOUT CONTRAST  TECHNIQUE: Multiplanar, multiecho pulse sequences of the brain and surrounding structures were obtained without intravenous contrast.  COMPARISON:  CT head 08/19/2015.  MRI brain 07/12/2015.  FINDINGS: Diffusion-weighted images demonstrate an acute nonhemorrhagic infarct involving the posterior right lentiform nucleus extending superiorly into the centrum semi bowel I. T2 changes are associated with this infarct. Mild periventricular T2 changes are again seen  bilaterally. The ventricles are proportionate to the degree of atrophy. No significant extra-axial fluid collection is present.  The inner ear structures are normally formed. T2 changes are again noted in the anterior pons and medulla. Flow is present in the major intracranial arteries. Bilateral lens replacements are noted. The globes and orbits are otherwise intact.  Mild mucosal thickening is present posterior ethmoid air cells bilaterally. There is some mucosal thickening in the lateral sphenoid sinuses bilaterally. The mastoid air cells are clear.  IMPRESSION: 1. Acute nonhemorrhagic infarct involving the posterior right lentiform nucleus extending superiorly to the centrum semi ovale. 2. Otherwise stable periventricular T2 changes bilaterally a moderate atrophy. 3. Mild sinus disease. These results were called by telephone at the time of interpretation on 08/20/2015 at 8:49 pm to Dr. Theotis Burrow , who verbally acknowledged these results.   Electronically Signed   By: San Morelle M.D.   On: 08/20/2015 20:50   Dg Hip Unilat With Pelvis 2-3 Views Left  08/20/2015   CLINICAL DATA:  81 year old female with history of fall complaining of pain in the left leg for the past week.  EXAM: DG HIP (WITH OR WITHOUT PELVIS) 2-3V LEFT  COMPARISON:  No priors.  FINDINGS: AP view of the pelvis demonstrates no acute displaced fracture of the bony pelvic ring. AP and lateral views of the left hip demonstrate no definite acute displaced fracture. Left femoral head is properly located in the acetabulum. There is joint space narrowing, subchondral sclerosis, subchondral cyst formation and osteophyte formation in the hip joints bilaterally (left greater than right), compatible with osteoarthritis.  IMPRESSION: 1. No acute radiographic abnormality of the bony pelvis or the left hip. 2. Degenerative changes of osteoarthritis in the hip joints bilaterally, as above.   Electronically Signed   By: Vinnie Langton M.D.   On:  08/20/2015 18:48    EKG: Independently reviewed. NSR, no acute ST segment changes  Assessment/Plan Principal Problem:   Stroke (cerebrum) (HCC) Active Problems:   Dyslipidemia   Diabetes mellitus type 2, controlled ([place])   Uncontrolled hypertension   Fall   CVA (cerebral infarction)   1. Admit to telemetry  2.  Acute CVA --Neurology consult appreciated --Add MRA and carotid dopplers to  work-up.  Echo done at the end of August, will not repeat at this time. --Fasting lipid panel, A1c --PT/OT/Speech eval and treat, will likely need SNF  3. DM --Hold metformin, SSI coverage  4.  HTN --Hold diuretic for now --Continue metoprolol  5.  History of paroxysmal atrial fibrillation noted in EMR but family does not seem familiar with this diagnosis.  Will need to assess candidacy for long term anticoagulation given stroke and multiple risk factors.  Monitor on telemetry while in the hospital.  May need outpatient Holter monitor.  Code Status: FULL  Family Communication: Three daughters at bedside Disposition Plan: Expect her to be here at least two midnights, may need SNF  Time spent: 60 minutes  [employer] [facility]  08/21/2015, 1:13 AM

## 2015-08-21 NOTE — Progress Notes (Signed)
Utilization review completed. Legrande Hao, RN, BSN. 

## 2015-08-22 ENCOUNTER — Inpatient Hospital Stay (HOSPITAL_COMMUNITY): Payer: Medicare Other

## 2015-08-22 DIAGNOSIS — M6289 Other specified disorders of muscle: Secondary | ICD-10-CM

## 2015-08-22 DIAGNOSIS — Z794 Long term (current) use of insulin: Secondary | ICD-10-CM

## 2015-08-22 DIAGNOSIS — I471 Supraventricular tachycardia, unspecified: Secondary | ICD-10-CM | POA: Insufficient documentation

## 2015-08-22 LAB — HEMOGLOBIN A1C
Hgb A1c MFr Bld: 8 % — ABNORMAL HIGH (ref 4.8–5.6)
Mean Plasma Glucose: 183 mg/dL

## 2015-08-22 LAB — GLUCOSE, CAPILLARY
Glucose-Capillary: 151 mg/dL — ABNORMAL HIGH (ref 65–99)
Glucose-Capillary: 160 mg/dL — ABNORMAL HIGH (ref 65–99)
Glucose-Capillary: 95 mg/dL (ref 65–99)

## 2015-08-22 MED ORDER — APIXABAN 2.5 MG PO TABS
2.5000 mg | ORAL_TABLET | Freq: Two times a day (BID) | ORAL | Status: DC
  Administered 2015-08-22 – 2015-08-24 (×5): 2.5 mg via ORAL
  Filled 2015-08-22 (×5): qty 1

## 2015-08-22 NOTE — Care Management Note (Addendum)
Case Management Note  Patient Details  Name: [name] MRN: [id number] Date of Birth: Oct 03, 1925  Subjective/Objective:                    Action/Plan: Patient admitted with CVA. Pt lives at home with family. PT/OT recommending SNF. MD requesting a benefits check for the cost of Eliquis for the patient. The cost per the benefits check was $7.40/ month. CM will continue to follow for discharge needs.   Expected Discharge Date:                  Expected Discharge Plan:  [place]  In-House Referral:     Discharge planning Services     Post Acute Care Choice:    Choice offered to:     DME Arranged:    DME Agency:     HH Arranged:    [place] Agency:     Status of Service:  In process, will continue to follow  Medicare Important Message Given:    Date Medicare IM Given:    Medicare IM give by:    Date Additional Medicare IM Given:    Additional Medicare Important Message give by:     If discussed at [place] of Stay Meetings, dates discussed:    Additional Comments:  Pollie Friar, RN 08/22/2015, 3:27 PM

## 2015-08-22 NOTE — Evaluation (Signed)
Physical Therapy Evaluation Patient Details Name: [name] MRN: [id number] DOB: 1925/01/19 Today's Date: 08/22/2015   History of Present Illness  Pt is an 81 y/o female with PMH of CAD, HTN, HLD, TIA, and Type 2 DM. She presented to the ED with slurred speech, increased weakness, gait instability, and headache.  She also had a fall. Imaging revealed acute nonhemorrhagic infarct of the R basal ganglia.  Clinical Impression  Pt presents with the impairments indicated below, requiring further acute PT.  Pt with L>R weakness and communication difficulties.   Further acute PT is necessary to ensure improved safety and independence with functional mobility.  At this point, PT recommends SNF upon discharge.  Discussed this with daughter and she understands and is open to this.     Follow Up Recommendations SNF    Equipment Recommendations  Rolling walker with 5" wheels    Recommendations for Other Services OT consult     Precautions / Restrictions Precautions Precautions: Fall Restrictions Weight Bearing Restrictions: No      Mobility  Bed Mobility Overal bed mobility: Needs Assistance Bed Mobility: Rolling;Sidelying to Sit Rolling: Min assist (to bring L LE across body and initiate trunk rolling) Sidelying to sit: Mod assist (for trunk support and LE management)          Transfers Overall transfer level: Needs assistance Equipment used: Rolling walker (2 wheeled) Transfers: Sit to/from Stand Sit to Stand: Min assist         General transfer comment: to initiate standing, bring hips through, and for safety due to unsteadiness  Ambulation/Gait Ambulation/Gait assistance: Mod assist Ambulation Distance (Feet): 10 Feet Assistive device: Rolling walker (2 wheeled) Gait Pattern/deviations: Step-to pattern;Decreased step length - right;Decreased step length - left;Decreased dorsiflexion - left;Decreased weight shift to left;Decreased weight shift to right;Decreased  stride length;Shuffle Gait velocity: slow Gait velocity interpretation: Below normal speed for age/gender General Gait Details: Pt requiring significant verbal and tactile cues to facilitate weight shifting and step, to look up, keep trunk neutral, and to keep walker closer to body.  L swing through limited by L hip flexion and dorsiflexion weakness.  Pt relatively unsteady with gait, with tendency to lean to L.  Pt needing assistance to bring L UE to RW but once there is able to grip.  2 person HHA may be beneficial to facilitate better weight shifting and more normalized gait pattern.  Stairs            Wheelchair Mobility    Modified Rankin (Stroke Patients Only) Modified Rankin (Stroke Patients Only) Pre-Morbid Rankin Score: Moderate disability Modified Rankin: Moderately severe disability     Balance Overall balance assessment: History of Falls;Needs assistance Sitting-balance support: No upper extremity supported;Feet supported Sitting balance-Leahy Scale: Poor Sitting balance - Comments: Pt able to maintain sitting balance for short periods but then tends to lean posteriorly, though is able to correct with VCs to lean forward. Postural control: Posterior lean Standing balance support: Bilateral upper extremity supported Standing balance-Leahy Scale: Poor Standing balance comment: Requires bilateral UE support and min assist to maintain standing, pt with tendency for trunk flexion and L lateral lean in standing                             Pertinent Vitals/Pain Pain Assessment: Faces Faces Pain Scale: Hurts little more Pain Location: stomach Pain Descriptors / Indicators: Aching Pain Intervention(s): Monitored during session    Home Living Family/patient expects to  be discharged to:: Private residence Living Arrangements: Children Available Help at Discharge: Family;Available 24 hours/day Type of Home: House Home Access: Level entry     Home Layout: Two  level;Able to live on main level with bedroom/bathroom Home Equipment: Other (comment) (had a RW, not sure if they still have it)      Prior Function Level of Independence: Needs assistance      ADL's / Homemaking Assistance Needed: family stands by during shower, gets her meals and manages her meds        Hand Dominance   Dominant Hand: Right    Extremity/Trunk Assessment   Upper Extremity Assessment: Generalized weakness;LUE deficits/detail       LUE Deficits / Details: noted decreased strength L UE   Lower Extremity Assessment: Generalized weakness;LLE deficits/detail   LLE Deficits / Details: decreased strength L LE hip flexion and dorsiflexion     Communication   Communication: Expressive difficulties (slurred speech, very quiet)  Cognition Arousal/Alertness: Awake/alert Behavior During Therapy: Flat affect Overall Cognitive Status: History of cognitive impairments - at baseline Area of Impairment: Safety/judgement;Problem solving         Safety/Judgement: Decreased awareness of safety;Decreased awareness of deficits   Problem Solving: Slow processing;Difficulty sequencing;Requires verbal cues;Requires tactile cues      General Comments General comments (skin integrity, edema, etc.): Daughter present and supportive.  States that there is typically someone available 24 hr at home.    Exercises        Assessment/Plan    PT Assessment Patient needs continued PT services  PT Diagnosis Abnormality of gait;Generalized weakness;Hemiplegia non-dominant side   PT Problem List Decreased strength;Decreased range of motion;Decreased activity tolerance;Decreased balance;Decreased mobility;Decreased coordination;Decreased knowledge of use of DME;Decreased safety awareness  PT Treatment Interventions DME instruction;Gait training;Functional mobility training;Therapeutic activities;Therapeutic exercise;Balance training;Neuromuscular re-education;Patient/family  education   PT Goals (Current goals can be found in the Care Plan section) Acute Rehab PT Goals Patient Stated Goal: none stated PT Goal Formulation: With patient/family Time For Goal Achievement: 09/05/15 Potential to Achieve Goals: Good    Frequency Min 3X/week   Barriers to discharge        Co-evaluation               End of Session Equipment Utilized During Treatment: Gait belt Activity Tolerance: Patient tolerated treatment well Patient left: in chair;with family/visitor present Nurse Communication: Mobility status         Time: 0802-0822 PT Time Calculation (min) (ACUTE ONLY): 20 min   Charges:   PT Evaluation $Initial PT Evaluation Tier I: 1 Procedure     PT G CodesEstill Bamberg Caterina Racine 2015/09/12, 9:17 AM  Lorita Officer, SPT

## 2015-08-22 NOTE — Evaluation (Signed)
Speech Language Pathology Evaluation Patient Details Name: [name] MRN: [id number] DOB: 1925-05-28 Today's Date: 08/22/2015 Time: 7793-9030 SLP Time Calculation (min) (ACUTE ONLY): 15 min  Problem List:  Patient Active Problem List   Diagnosis Date Noted  . SVT (supraventricular tachycardia) ([place])   . Fall 08/21/2015  . CVA (cerebral infarction) 08/21/2015  . PAF (paroxysmal atrial fibrillation) ([place])   . Stroke (cerebrum) ([place]) 08/20/2015  . Awareness alteration, transient 08/02/2015  . Sinusitis 07/12/2015  . Aphasia   . TIA (transient ischemic attack)   . FTT (failure to thrive) in adult   . Memory loss 02/24/2015  . Syncope 10/31/2014  . Diabetes mellitus type 2, controlled ([place]) 10/31/2014  . Uncontrolled hypertension 10/31/2014  . Headache 11/26/2013  . Encounter for long-term (current) use of other medications 11/20/2011  . Menopausal state 11/20/2011  . CAD, NATIVE VESSEL 02/14/2010  . PAROXYSMAL ATRIAL FIBRILLATION 02/14/2010  . GERD 02/14/2010  . ANEMIA-B12 DEFICIENCY 01/01/2010  . OSTEOARTHRITIS 01/01/2010  . PERSONAL HX COLONIC POLYPS 01/01/2010  . Iron deficiency anemia 12/28/2009  . DIZZINESS AND GIDDINESS 12/28/2009  . LUMBOSACRAL RADICULOPATHY 08/11/2008  . KNEE PAIN, RIGHT 02/11/2008  . Dyslipidemia 04/23/2007  . Allergic rhinitis 04/23/2007  . GALLBLADDER DISEASE 04/23/2007  . ARTHRITIS 04/23/2007  . [place] DISEASE, LUMBAR 04/23/2007  . DIVERTICULITIS, HX OF 04/23/2007   Past Medical History:  Past Medical History  Diagnosis Date  . Arrhythmia     paroxysmal atrial fibrillation  . Coronary artery disease   . Hypertension   . Hyperlipidemia   . Edema   . GERD (gastroesophageal reflux disease)   . Anemia B twelve deficiency   . Dizziness - giddy   . Abdominal pain   . Iron deficiency anemia, unspecified   . Pernicious anemia   . Thoracic or lumbosacral neuritis or radiculitis, unspecified   . Symptomatic menopausal or female  climacteric states   . Lumbago   . Benign neoplasm of colon   . Allergic rhinitis, cause unspecified   . Type II diabetes mellitus ([place])   . Degeneration of lumbar or lumbosacral intervertebral disc   . Arthritis     "arms and hands" (06/30/2013)   Past Surgical History:  Past Surgical History  Procedure Laterality Date  . Back surgery    . Cholecystectomy    . Appendectomy  1974  . Abdominal hysterectomy  1974  . Carpal tunnel release Bilateral 1970's  . Cataract extraction, bilateral Bilateral   . Cardiac catheterization    . Coronary angioplasty with stent placement      "1" (06/30/2013  . Lumbar disc surgery     HPI:  Pt is an 81 y/o female with PMH of CAD, HTN, HLD, TIA, and Type 2 DM. She presented to the ED with slurred speech, increased weakness, gait instability, and headache. She also had a fall. Imaging revealed acute nonhemorrhagic infarct of the R basal ganglia.   Assessment / Plan / Recommendation Clinical Impression   Cognitive-linguistic evaluation completed.  Patient history of baseline deficits; lives with daughter who helps with medication and financial management and was not consistently oriented to date.  Patient now with cognitive deficits deficits in sustained attention, intellectual awareness and basic problem solving.  Patient also demonstrates left inattention resulting in impaired ability to safely complete basic self-care tasks.  Speech also impaired and is characterized by low vocal intensity and some imprecise productions; vocal intensity appears to be greatest factor to overall intelligibility. As a result, skilled SLP services are warranted  during this hospitalization and at the next level of care.  It is anticipated that patient will require 24/7 assist upon discharge.     SLP Assessment  Patient needs continued Speech Lanaguage Pathology Services    Follow Up Recommendations  24 hour supervision/assistance;Home health SLP    Frequency and  Duration min 2x/week  2 weeks   Pertinent Vitals/Pain Pain Assessment: No/denies pain   SLP Goals  Progression toward goals:  (Eval ) Patient/Family Stated Goal: to get something to eat  Potential to Achieve Goals (ACUTE ONLY): Fair Potential Considerations (ACUTE ONLY): Ability to learn/carryover information;Previous level of function;Severity of impairments  SLP Evaluation Prior Functioning  Cognitive/Linguistic Baseline: Baseline deficits Baseline deficit details: patient not consistently able to state date PTA lives with daughter  Type of Home: House  Lives With: Daughter Available Help at Discharge: Family;Available 24 hours/day Vocation: Retired   Associate Professor  Overall Cognitive Status: Impaired/Different from baseline Arousal/Alertness: Lethargic Orientation Level: Oriented to person;Disoriented to place;Disoriented to time Attention: Sustained Sustained Attention: Impaired Sustained Attention Impairment: Functional basic Memory: Impaired Memory Impairment: Decreased recall of new information Awareness: Impaired Awareness Impairment: Intellectual impairment Problem Solving: Impaired Problem Solving Impairment: Functional basic Executive Function:  (all impaired due to lower level deficits) Safety/Judgment: Impaired    Comprehension  Auditory Comprehension Overall Auditory Comprehension: Impaired Yes/No Questions: Impaired Basic Immediate Environment Questions: 50-74% accurate Commands: Impaired One Step Basic Commands: 50-74% accurate Interfering Components: Attention;Visual impairments;Working Field seismologist: Physicist, medical: Not tested Reading Comprehension Reading Status: Not tested    Expression Expression Primary Mode of Expression: Verbal Verbal Expression Overall Verbal Expression: Appears within functional limits for tasks assessed Written Expression Dominant Hand:  Right Written Expression: Not tested   Oral / Motor Oral Motor/Sensory Function Overall Oral Motor/Sensory Function: Impaired (left deficits greather than right) Labial ROM: Reduced right;Reduced left Labial Strength: Reduced Labial Sensation: Reduced Lingual ROM: Reduced right;Reduced left Lingual Symmetry: Abnormal symmetry left Lingual Strength: Reduced Lingual Sensation: Reduced Facial ROM: Reduced right;Reduced left Facial Symmetry: Right droop;Left droop Facial Strength: Reduced Motor Speech Overall Motor Speech: Impaired Respiration: Impaired Level of Impairment: Word Phonation: Low vocal intensity Resonance: Within functional limits Articulation: Impaired Level of Impairment: Word Intelligibility: Intelligibility reduced Word: 25-49% accurate Motor Planning: Witnin functional limits Interfering Components: Inadequate dentition Effective Techniques: Increased vocal intensity   GO    Carmelia Roller., CCC-SLP [phone]  Nash Bolls 08/22/2015, 3:55 PM

## 2015-08-22 NOTE — Progress Notes (Signed)
PATIENT DETAILS Name: [name] Age: 81 y.o. Sex: female Date of Birth: September 14, 1925 Admit Date: 08/20/2015 Admitting Physician Lily Kocher, MD HBZ:JIRCVEL, Hilliard Clark, MD  Subjective: Awake alert. Speech slow but clear. Daughter at bedside. Events from earlier this morning noted.  Assessment/Plan: Principal Problem: Acute right basal ganglia CVA: Mild left upper extremity weakness. Recent 2-D echocardiogram (07/12/15) negative for significant embolic source, recent carotid Doppler (07/14/15) negative for significant stenosis. Does have history of atrial fibrillation-reviewed outpatient cardiology note on October 2015-deemed not to be a candidate for anticoagulation. Have discussed starting eliquis.with daughter at bedside this morning, she is getting in touch with numerous family members before we decide to start anticoagulation. Family fully aware of fall risk/bleeding risk. In the meantime continue with current dosing of aspirin. Continue statin- LDL 150 (goal <70). A1c 8.0. See below.Will await further recommendations from neurology.   Active Problems: Dyslipidemia: See above  Diabetes mellitus type 2, controlled: CBGs stable,  A1c as above. Continue SSI-resume metformin on discharge. Will need more aggressive glycemic control-but frail/elderly-we'll defer to the outpatient setting  History of paroxysmal atrial fibrillation: See above  SVT: Brief run of SVT last night-continue metoprolol  Hypertension: Controlled-continue metoprolol  Dementia: Pleasantly confused-suspect at baseline. Continue receptive.  History of CAD: Last PCI in 2011-continue aspirin, statin and beta blocker. No chest pain or shortness of breath.    Disposition: Remain inpatient-await recommendations from neurology and PT  Antimicrobial agents  See below  Anti-infectives    None      DVT Prophylaxis: Prophylactic Lovenox   Code Status: Full code  Family Communication Daughter at  bedside  Procedures: None  CONSULTS:  neurology  Time spent 25 minutes-Greater than 50% of this time was spent in counseling, explanation of diagnosis, planning of further management, and coordination of care.  MEDICATIONS: Scheduled Meds: . aspirin EC  325 mg Oral Daily  . atorvastatin  40 mg Oral q1800  . donepezil  5 mg Oral QHS  . enoxaparin (LOVENOX) injection  40 mg Subcutaneous Q24H  . insulin aspart  0-9 Units Subcutaneous TID WC  . iron polysaccharides  150 mg Oral Daily  . metoprolol succinate  50 mg Oral Daily  . pantoprazole  40 mg Oral Daily  . risperiDONE  0.5 mg Oral QHS   Continuous Infusions:  PRN Meds:.acetaminophen **OR** acetaminophen, LORazepam    PHYSICAL EXAM: Vital signs in last 24 hours: Filed Vitals:   08/21/15 2108 08/22/15 0134 08/22/15 0548 08/22/15 0929  BP: 166/52 133/60 132/54 118/54  Pulse: 57 71 57 78  Temp: 97.7 F (36.5 C) 97.8 F (36.6 C) 97.7 F (36.5 C) 98.7 F (37.1 C)  TempSrc: Oral Oral Oral Oral  Resp: 18 18 18 18   Height:      Weight:      SpO2: 100% 100% 98%     Weight change:  Filed Weights   08/20/15 1424 08/20/15 2329  Weight: 50.803 kg (112 lb) 50.077 kg (110 lb 6.4 oz)   Body mass index is 20.19 kg/(m^2).   Gen Exam: Awake and alert with clear speech. Neck: Supple, No JVD.   Chest: B/L Clear.   CVS: S1 S2 Regular, no murmurs.  Abdomen: soft, BS +, non tender, non distended.  Extremities: no edema, lower extremities warm to touch. Neurologic: Left upper extremity 4/5 Skin: No Rash.   Wounds: N/A.   Intake/Output from previous day:  Intake/Output Summary (Last 24  hours) at 08/22/15 0939 Last data filed at 08/21/15 1731  Gross per 24 hour  Intake     20 ml  Output      0 ml  Net     20 ml     LAB RESULTS: CBC  Recent Labs Lab 08/19/15 0813 08/20/15 1625 08/21/15 0544  WBC  --  6.1 4.5  HGB 12.6 11.2* 10.6*  HCT 37.0 33.7* 32.6*  PLT  --  235 214  MCV  --  89.2 88.6  MCH  --  29.6 28.8   MCHC  --  33.2 32.5  RDW  --  13.9 13.8  LYMPHSABS  --  2.0  --   MONOABS  --  0.7  --   EOSABS  --  0.2  --   BASOSABS  --  0.0  --     Chemistries   Recent Labs Lab 08/19/15 0813 08/20/15 1625 08/21/15 0544  NA 139 137 136  K 4.5 4.2 4.3  CL 105 101 104  CO2  --  23 18*  GLUCOSE 156* 186* 147*  BUN 31* 26* 21*  CREATININE 1.10* 1.14* 0.95  CALCIUM  --  9.6 9.2    CBG:  Recent Labs Lab 08/21/15 0706 08/21/15 1137 08/21/15 1625 08/21/15 2052 08/22/15 0629  GLUCAP 137* 123* 90 121* 151*    GFR Estimated Creatinine Clearance: 32.4 mL/min (by C-G formula based on Cr of 0.95).  Coagulation profile No results for input(s): INR, PROTIME in the last 168 hours.  Cardiac Enzymes No results for input(s): CKMB, TROPONINI, MYOGLOBIN in the last 168 hours.  Invalid input(s): CK  Invalid input(s): POCBNP No results for input(s): DDIMER in the last 72 hours.  Recent Labs  08/21/15 0544  HGBA1C 8.0*    Recent Labs  08/21/15 0544  CHOL 227*  HDL 55  LDLCALC 150*  TRIG 108  CHOLHDL 4.1   No results for input(s): TSH, T4TOTAL, T3FREE, THYROIDAB in the last 72 hours.  Invalid input(s): FREET3 No results for input(s): VITAMINB12, FOLATE, FERRITIN, TIBC, IRON, RETICCTPCT in the last 72 hours. No results for input(s): LIPASE, AMYLASE in the last 72 hours.  Urine Studies No results for input(s): UHGB, CRYS in the last 72 hours.  Invalid input(s): UACOL, UAPR, USPG, UPH, UTP, UGL, UKET, UBIL, UNIT, UROB, ULEU, UEPI, UWBC, URBC, UBAC, CAST, UCOM, BILUA  MICROBIOLOGY: Recent Results (from the past 240 hour(s))  Urine culture     Status: None   Collection Time: 08/20/15  5:17 PM  Result Value Ref Range Status   Specimen Description URINE, CLEAN CATCH  Final   Special Requests Normal  Final   Culture MULTIPLE SPECIES PRESENT, SUGGEST RECOLLECTION  Final   Report Status 08/21/2015 FINAL  Final    RADIOLOGY STUDIES/RESULTS: Ct Head Wo Contrast  08/22/2015    CLINICAL DATA:  Code stroke.  Left-sided weakness.  EXAM: CT HEAD WITHOUT CONTRAST  TECHNIQUE: Contiguous axial images were obtained from the base of the skull through the vertex without intravenous contrast.  COMPARISON:  CT head 08/19/2015 MRI 08/20/2015.  FINDINGS: Mild diffuse cerebral atrophy. Ventricular dilatation consistent with central atrophy. Cavum septum pellucidum. Low-attenuation changes in the deep white matter likely representing small vessel ischemia. Low-attenuation changes in the right lentiform nucleus corresponding to changes on prior MRI. This is more prominent on previous CT scan. Increased prominence may be due to normal interval evolution of existing infarct. No mass effect or midline shift. No abnormal extra-axial fluid collections. Gray-white matter  junctions are distinct. Basal cisterns are not effaced. No evidence of acute intracranial hemorrhage. No depressed skull fractures. Mucosal thickening in the paranasal sinuses. Mastoid air cells are not opacified. Vascular calcifications.  IMPRESSION: Low-attenuation changes in the deep white matter bilaterally consistent with small vessel ischemia. Developing increase low-attenuation changes in the right lentiform nucleus corresponding to changes on previous MRI. This is more prominent on previous CT scan, possibly due to normal interval evolution of existing infarct. No evidence of acute intracranial hemorrhage or mass effect.  These results were called by telephone at the time of interpretation on 08/22/2015 at 6:46 am to Dr. Doy Mince , who verbally acknowledged these results.   Electronically Signed   By: Lucienne Capers M.D.   On: 08/22/2015 06:50   Ct Head Wo Contrast  08/19/2015   CLINICAL DATA:  Slurred speech. Gait disturbance. Symptoms began 1,700 yesterday.  EXAM: CT HEAD WITHOUT CONTRAST  TECHNIQUE: Contiguous axial images were obtained from the base of the skull through the vertex without intravenous contrast.  COMPARISON:   07/22/2015 CT.  MRI 07/12/2015.  FINDINGS: The brain shows mild generalized atrophy. There are mild chronic small-vessel ischemic changes of the hemispheric deep white matter. There is focal encephalomalacia at the right temporal tip. No CT evidence of acute infarction. No mass lesion, hemorrhage, hydrocephalus or extra-axial collection. The calvarium is unremarkable. Sinuses are clear.  IMPRESSION: No acute finding by CT. Atrophy and mild chronic small-vessel ischemic change. Chronic encephalomalacia at the right temporal tip. Atherosclerosis of the major vessels at the base of the brain.   Electronically Signed   By: Nelson Chimes M.D.   On: 08/19/2015 09:24   Mr Brain Wo Contrast (neuro Protocol)  08/20/2015   CLINICAL DATA:  Left-sided weakness. Slurred speech. Abnormal balance.  EXAM: MRI HEAD WITHOUT CONTRAST  TECHNIQUE: Multiplanar, multiecho pulse sequences of the brain and surrounding structures were obtained without intravenous contrast.  COMPARISON:  CT head 08/19/2015.  MRI brain 07/12/2015.  FINDINGS: Diffusion-weighted images demonstrate an acute nonhemorrhagic infarct involving the posterior right lentiform nucleus extending superiorly into the centrum semi bowel I. T2 changes are associated with this infarct. Mild periventricular T2 changes are again seen bilaterally. The ventricles are proportionate to the degree of atrophy. No significant extra-axial fluid collection is present.  The inner ear structures are normally formed. T2 changes are again noted in the anterior pons and medulla. Flow is present in the major intracranial arteries. Bilateral lens replacements are noted. The globes and orbits are otherwise intact.  Mild mucosal thickening is present posterior ethmoid air cells bilaterally. There is some mucosal thickening in the lateral sphenoid sinuses bilaterally. The mastoid air cells are clear.  IMPRESSION: 1. Acute nonhemorrhagic infarct involving the posterior right lentiform nucleus  extending superiorly to the centrum semi ovale. 2. Otherwise stable periventricular T2 changes bilaterally a moderate atrophy. 3. Mild sinus disease. These results were called by telephone at the time of interpretation on 08/20/2015 at 8:49 pm to Dr. Theotis Burrow , who verbally acknowledged these results.   Electronically Signed   By: San Morelle M.D.   On: 08/20/2015 20:50   Mr Jodene Nam Head/brain Wo Cm  08/21/2015   CLINICAL DATA:  Initial evaluation for stroke.  EXAM: MRA HEAD WITHOUT CONTRAST  TECHNIQUE: Angiographic images of the Circle of Willis were obtained using MRA technique without intravenous contrast.  COMPARISON:  Prior MRI from 08/20/2015.  FINDINGS: ANTERIOR CIRCULATION:  Motion degraded study.  The distal cervical segments of the internal carotid  arteries are patent with antegrade flow. Petrous cavernous, and supraclinoid segments are patent without focal stenosis. Right A1 segment is hypoplastic. Moderate focal narrowing at the origin of the left A1 segment. Anterior communicating artery grossly normal. Anterior cerebral arteries patent bilaterally.  M1 segments patent without stenosis or occlusion. MCA bifurcations within normal limits. MCA branches symmetric bilaterally. Atheromatous irregularity within the proximal MCA branches. Distal MCA branches not well evaluated on this exam secondary to motion.  POSTERIOR CIRCULATION:  Vertebral arteries are patent to the vertebrobasilar junction. Posterior inferior cerebral arteries not well evaluated on this exam. Basilar artery widely patent. Superior cerebellar arteries patent proximally. There is an apparent focal outpouching arising in the region of the right superior cerebellar artery measuring approximately 3 mm, suspicious for small aneurysm. Left P1 segment widely patent. Right P1 segment is hypoplastic. Prominent right posterior communicating artery. Multi focal irregularity seen within the posterior cerebral arteries, likely related  underlying atheromatous disease. No focal high-grade stenosis. Posterior cerebral arteries are opacified to their distal aspect.  IMPRESSION: 1. Motion degraded study. 2. No focal severe or correctable stenosis identified within the intracranial circulation. 3. Atheromatous irregularity within the MCA and PCA branches bilaterally. 4. 3 mm focal outpouching in the region of the right superior cerebellar artery, suspicious for small aneurysm.   Electronically Signed   By: Jeannine Boga M.D.   On: 08/21/2015 03:55   Dg Hip Unilat With Pelvis 2-3 Views Left  08/20/2015   CLINICAL DATA:  81 year old female with history of fall complaining of pain in the left leg for the past week.  EXAM: DG HIP (WITH OR WITHOUT PELVIS) 2-3V LEFT  COMPARISON:  No priors.  FINDINGS: AP view of the pelvis demonstrates no acute displaced fracture of the bony pelvic ring. AP and lateral views of the left hip demonstrate no definite acute displaced fracture. Left femoral head is properly located in the acetabulum. There is joint space narrowing, subchondral sclerosis, subchondral cyst formation and osteophyte formation in the hip joints bilaterally (left greater than right), compatible with osteoarthritis.  IMPRESSION: 1. No acute radiographic abnormality of the bony pelvis or the left hip. 2. Degenerative changes of osteoarthritis in the hip joints bilaterally, as above.   Electronically Signed   By: Vinnie Langton M.D.   On: 08/20/2015 18:48    Oren Binet, MD  [facility] Pager:[phone]  If 7PM-7AM, please contact night-coverage [email or web address] Password [email or web address] 08/22/2015, 9:39 AM   LOS: 1 day

## 2015-08-22 NOTE — Progress Notes (Signed)
STROKE TEAM PROGRESS NOTE   SUBJECTIVE (INTERVAL HISTORY) Family member at the bedside. Social worker worked on the case and she has to pay $7.40 monthly for eliquis. Pt is sitting in chair with family. Still has left facial and LUE weakness. Mild LLE weakness. PT recommend SNF.    OBJECTIVE Temp:  [97.7 F (36.5 C)-98.6 F (37 C)] 97.7 F (36.5 C) (10/11 0548) Pulse Rate:  [57-71] 57 (10/11 0548) Cardiac Rhythm:  [-] Heart block (10/11 0738) Resp:  [16-18] 18 (10/11 0548) BP: (132-166)/(52-116) 132/54 mmHg (10/11 0548) SpO2:  [98 %-100 %] 98 % (10/11 0548)  CBC:   Recent Labs Lab 08/20/15 1625 08/21/15 0544  WBC 6.1 4.5  NEUTROABS 3.2  --   HGB 11.2* 10.6*  HCT 33.7* 32.6*  MCV 89.2 88.6  PLT 235 086   Basic Metabolic Panel:   Recent Labs Lab 08/20/15 1625 08/21/15 0544  NA 137 136  K 4.2 4.3  CL 101 104  CO2 23 18*  GLUCOSE 186* 147*  BUN 26* 21*  CREATININE 1.14* 0.95  CALCIUM 9.6 9.2   Lipid Panel:     Component Value Date/Time   CHOL 227* 08/21/2015 0544   TRIG 108 08/21/2015 0544   TRIG 80 09/04/2006 0837   HDL 55 08/21/2015 0544   CHOLHDL 4.1 08/21/2015 0544   CHOLHDL 3.9 CALC 09/04/2006 0837   VLDL 22 08/21/2015 0544   LDLCALC 150* 08/21/2015 0544   HgbA1c:  Lab Results  Component Value Date   HGBA1C 8.0* 08/21/2015    IMAGING I have personally reviewed the radiological images below and agree with the radiology interpretations.  Ct Head Wo Contrast 08/19/2015    No acute finding by CT. Atrophy and mild chronic small-vessel ischemic change. Chronic encephalomalacia at the right temporal tip. Atherosclerosis of the major vessels at the base of the brain.     Mr Brain Wo Contrast (neuro Protocol) 08/20/2015   1. Acute nonhemorrhagic infarct involving the posterior right lentiform nucleus extending superiorly to the centrum semi ovale. 2. Otherwise stable periventricular T2 changes bilaterally a moderate atrophy. 3. Mild sinus disease.   Mr  [name] Head/brain Wo Cm 08/21/2015    1. Motion degraded study. 2. No focal severe or correctable stenosis identified within the intracranial circulation. 3. Atheromatous irregularity within the MCA and PCA branches bilaterally. 4. 3 mm focal outpouching in the region of the right superior cerebellar artery, suspicious for small aneurysm.     Carotid UltraSound 07/14/15 - Bilateral: 1-39% ICA stenosis. Vertebral artery flow is antegrade.  2D echo 07/12/15 - Left ventricle: The cavity size was normal. Wall thickness wasnormal. Systolic function was normal. The estimated ejectionfraction was in the range of 55% to 60%. Wall motion was normal;there were no regional wall motion abnormalities. Dopplerparameters are consistent with abnormal left ventricularrelaxation (grade 1 diastolic dysfunction).   PHYSICAL EXAM  General - thin built female, well developed, in no apparent distress.  Ophthalmologic - Fundi not visualized due to eye movement.  Cardiovascular - Regular rate and rhythm, no afib rhythm.  Mental Status -  Level of arousal and orientation to place, and person were intact, but not orientated to time. Language including expression, repetition, comprehension was assessed and found intact, but naming 3/4 Fund of Knowledge was assessed and was impaired.  Cranial Nerves II - XII - II - Visual field intact OU. III, IV, VI - Extraocular movements intact. V - Facial sensation intact bilaterally. VII - left facial droop. VIII - Hearing &  vestibular intact bilaterally. X - Palate elevates symmetrically. XI - Chin turning & shoulder shrug intact bilaterally. XII - Tongue protrusion intact.  Motor Strength - The patient's strength was  LUE 3-/5 proximal and 4/5 distally and pronator drift was present on the left, and LLE 4+/5.  Bulk was normal and fasciculations were absent.   Motor Tone - Muscle tone was assessed at the neck and appendages and was normal.  Reflexes - The patient's  reflexes were 1+ in all extremities and she had no pathological reflexes.  Sensory - Light touch, temperature/pinprick were assessed and were symmetrical.    Coordination - The patient had normal movements in the hands and feet with no ataxia or dysmetria.  Tremor was absent.  Gait and Station - not tested due to safety concerns   ASSESSMENT/PLAN [name] is a 81 y.o. female with history of history pAfib not on AC, type 2 diabetes mellitus, hypertension, hyperlipidemia, coronary artery disease, and dementia  presenting with left sided weakness. She did not receive IV t-PA due to delay in arrival.   Stroke:  Right posterior lentiform nucleus infarct either embolic secondary to known atrial fibrillation without AC vs due to small vessel disease.  Resultant  Left facial droop and left hemiparesis  MRI  Right posterior lentiform nucleus infarct  MRA  Intracranial atherosclerosis  Carotid Doppler  07/14/2015 No significant stenosis   2D Echo  07/12/2015 No source of embolus, EF 55-60%   LDL 150  HgbA1c 7.9 Sept 2016  Lovenox 40 mg sq daily  for VTE prophylaxis Diet Heart Room service appropriate?: Yes; Fluid consistency:: Thin  aspirin 81 mg orally every day prior to admission, now on aspirin 325 mg orally every day .   Ongoing aggressive stroke risk factor management  Therapy recommendations:  SNF  Disposition: pending   Paroxysmal Atrial Fibrillation  Noted in medical record, dx in 1995 per note March 2011 without recurrence  Home anticoagulation:  None  Not an anticoagulation in the past due to fall risk per cardiology note 03/2015 - family stated that she only had one fall.   Await case management response about NOAC coverage  Recommend eliquis 2.5 mg bid   Hypertension  Stable  BP goal gradually normalize  Hyperlipidemia  Home meds:  Not on statin  LDL 150, goal < 70  New lipitor 40 mg daily added  Continue statin at discharge  Diabetes, type  II  HgbA1c 7.9 Sept 2016, goal < 7.0  Uncontrolled  Possible seizxure  Followed by Dr. Delice Lesch who will determine dx  adm 8/31-07/15/2015 for aphasia/slurred speech and possible seizures. EEG neg ([facility])  07/22/2015 slurred speech and lethargy, stopped seroquel Doy Mince)  Not on AED yet  Other Stroke Risk Factors  Advanced age  Hx TIAs -   Coronary artery disease  Other Active Problems  Dementia on aricept  Hospital day # 1  Neurology will sign off. Please call with questions. Pt will follow up with Dr. Delice Lesch on 09/18/15 as scheduled. Thanks for the consult.  Rosalin Hawking, MD PhD Stroke Neurology 08/22/2015 12:44 PM    To contact Stroke Continuity provider, please refer to [email or web address] After hours, contact General Neurology

## 2015-08-22 NOTE — Progress Notes (Addendum)
Subjective: Patient admitted with left sided weakness. This AM on evaluation by nursing was felt to be weaker on the left.  Previous NIHSS of 2, increased per nursing to an NIHSS of 4.  Code stroke was called.  By the time of my arrival the patient had returned to her admission level of weakness.  Patient with a LKW date of 10/7 with a documented stroke on MRI.  She is not a tPA candidate.  Code stroke cancelled.    Objective: Current vital signs: BP 132/54 mmHg  Pulse 57  Temp(Src) 97.7 F (36.5 C) (Oral)  Resp 18  Ht 5\' 2"  (1.575 m)  Wt 50.077 kg (110 lb 6.4 oz)  BMI 20.19 kg/m2  SpO2 98% Vital signs in last 24 hours: Temp:  [97.7 F (36.5 C)-98.6 F (37 C)] 97.7 F (36.5 C) (10/11 0548) Pulse Rate:  [57-71] 57 (10/11 0548) Resp:  [16-18] 18 (10/11 0548) BP: (132-166)/(49-116) 132/54 mmHg (10/11 0548) SpO2:  [98 %-100 %] 98 % (10/11 0548)  Intake/Output from previous day: 10/10 0701 - 10/11 0700 In: 20 [P.O.:20] Out: -  Intake/Output this shift:   Nutritional status: Diet Heart Room service appropriate?: Yes; Fluid consistency:: Thin  Neurologic Exam: Mental Status: Lethargic but alerts when name called.  Speaks little but responds appropriately to questioning.  Able to follow simple commands without difficulty. Cranial Nerves: II: Discs flat bilaterally; Visual fields grossly normal, pupils equal, round, reactive to light and accommodation III,IV, VI: ptosis not present, extra-ocular motions intact bilaterally V,VII: left facial droop VIII: hearing normal bilaterally IX,X: gag reflex reduced XI: shoulder shrug decreased on the left XII: midline tongue extension Motor: Right : Upper extremity   5/5    Left:     Upper extremity   4-/5 Both lower extremities 5-/5 Tone and bulk:normal tone throughout; no atrophy noted Deep Tendon Reflexes: 1+ and symmetric throughout Plantars: Mute on the left and flexor on the right   Lab Results: Basic Metabolic Panel:  Recent  Labs Lab 08/19/15 0813 08/20/15 1625 08/21/15 0544  NA 139 137 136  K 4.5 4.2 4.3  CL 105 101 104  CO2  --  23 18*  GLUCOSE 156* 186* 147*  BUN 31* 26* 21*  CREATININE 1.10* 1.14* 0.95  CALCIUM  --  9.6 9.2    Liver Function Tests: No results for input(s): AST, ALT, ALKPHOS, BILITOT, PROT, ALBUMIN in the last 168 hours. No results for input(s): LIPASE, AMYLASE in the last 168 hours. No results for input(s): AMMONIA in the last 168 hours.  CBC:  Recent Labs Lab 08/19/15 0813 08/20/15 1625 08/21/15 0544  WBC  --  6.1 4.5  NEUTROABS  --  3.2  --   HGB 12.6 11.2* 10.6*  HCT 37.0 33.7* 32.6*  MCV  --  89.2 88.6  PLT  --  235 214    Cardiac Enzymes: No results for input(s): CKTOTAL, CKMB, CKMBINDEX, TROPONINI in the last 168 hours.  Lipid Panel:  Recent Labs Lab 08/21/15 0544  CHOL 227*  TRIG 108  HDL 55  CHOLHDL 4.1  VLDL 22  LDLCALC 150*    CBG:  Recent Labs Lab 08/21/15 0706 08/21/15 1137 08/21/15 1625 08/21/15 2052  GLUCAP 137* 123* 90 121*    Microbiology: Results for orders placed or performed during the hospital encounter of 08/20/15  Urine culture     Status: None   Collection Time: 08/20/15  5:17 PM  Result Value Ref Range Status   Specimen Description URINE,  CLEAN CATCH  Final   Special Requests Normal  Final   Culture MULTIPLE SPECIES PRESENT, SUGGEST RECOLLECTION  Final   Report Status 08/21/2015 FINAL  Final    Coagulation Studies: No results for input(s): LABPROT, INR in the last 72 hours.  Imaging: Mr Brain Wo Contrast (neuro Protocol)  08/20/2015   CLINICAL DATA:  Left-sided weakness. Slurred speech. Abnormal balance.  EXAM: MRI HEAD WITHOUT CONTRAST  TECHNIQUE: Multiplanar, multiecho pulse sequences of the brain and surrounding structures were obtained without intravenous contrast.  COMPARISON:  CT head 08/19/2015.  MRI brain 07/12/2015.  FINDINGS: Diffusion-weighted images demonstrate an acute nonhemorrhagic infarct involving  the posterior right lentiform nucleus extending superiorly into the centrum semi bowel I. T2 changes are associated with this infarct. Mild periventricular T2 changes are again seen bilaterally. The ventricles are proportionate to the degree of atrophy. No significant extra-axial fluid collection is present.  The inner ear structures are normally formed. T2 changes are again noted in the anterior pons and medulla. Flow is present in the major intracranial arteries. Bilateral lens replacements are noted. The globes and orbits are otherwise intact.  Mild mucosal thickening is present posterior ethmoid air cells bilaterally. There is some mucosal thickening in the lateral sphenoid sinuses bilaterally. The mastoid air cells are clear.  IMPRESSION: 1. Acute nonhemorrhagic infarct involving the posterior right lentiform nucleus extending superiorly to the centrum semi ovale. 2. Otherwise stable periventricular T2 changes bilaterally a moderate atrophy. 3. Mild sinus disease. These results were called by telephone at the time of interpretation on 08/20/2015 at 8:49 pm to Dr. Theotis Burrow , who verbally acknowledged these results.   Electronically Signed   By: San Morelle M.D.   On: 08/20/2015 20:50   Mr Jodene Nam Head/brain Wo Cm  08/21/2015   CLINICAL DATA:  Initial evaluation for stroke.  EXAM: MRA HEAD WITHOUT CONTRAST  TECHNIQUE: Angiographic images of the Circle of Willis were obtained using MRA technique without intravenous contrast.  COMPARISON:  Prior MRI from 08/20/2015.  FINDINGS: ANTERIOR CIRCULATION:  Motion degraded study.  The distal cervical segments of the internal carotid arteries are patent with antegrade flow. Petrous cavernous, and supraclinoid segments are patent without focal stenosis. Right A1 segment is hypoplastic. Moderate focal narrowing at the origin of the left A1 segment. Anterior communicating artery grossly normal. Anterior cerebral arteries patent bilaterally.  M1 segments patent  without stenosis or occlusion. MCA bifurcations within normal limits. MCA branches symmetric bilaterally. Atheromatous irregularity within the proximal MCA branches. Distal MCA branches not well evaluated on this exam secondary to motion.  POSTERIOR CIRCULATION:  Vertebral arteries are patent to the vertebrobasilar junction. Posterior inferior cerebral arteries not well evaluated on this exam. Basilar artery widely patent. Superior cerebellar arteries patent proximally. There is an apparent focal outpouching arising in the region of the right superior cerebellar artery measuring approximately 3 mm, suspicious for small aneurysm. Left P1 segment widely patent. Right P1 segment is hypoplastic. Prominent right posterior communicating artery. Multi focal irregularity seen within the posterior cerebral arteries, likely related underlying atheromatous disease. No focal high-grade stenosis. Posterior cerebral arteries are opacified to their distal aspect.  IMPRESSION: 1. Motion degraded study. 2. No focal severe or correctable stenosis identified within the intracranial circulation. 3. Atheromatous irregularity within the MCA and PCA branches bilaterally. 4. 3 mm focal outpouching in the region of the right superior cerebellar artery, suspicious for small aneurysm.   Electronically Signed   By: Jeannine Boga M.D.   On: 08/21/2015 03:55  Dg Hip Unilat With Pelvis 2-3 Views Left  08/20/2015   CLINICAL DATA:  81 year old female with history of fall complaining of pain in the left leg for the past week.  EXAM: DG HIP (WITH OR WITHOUT PELVIS) 2-3V LEFT  COMPARISON:  No priors.  FINDINGS: AP view of the pelvis demonstrates no acute displaced fracture of the bony pelvic ring. AP and lateral views of the left hip demonstrate no definite acute displaced fracture. Left femoral head is properly located in the acetabulum. There is joint space narrowing, subchondral sclerosis, subchondral cyst formation and osteophyte  formation in the hip joints bilaterally (left greater than right), compatible with osteoarthritis.  IMPRESSION: 1. No acute radiographic abnormality of the bony pelvis or the left hip. 2. Degenerative changes of osteoarthritis in the hip joints bilaterally, as above.   Electronically Signed   By: Vinnie Langton M.D.   On: 08/20/2015 18:48    Medications:  I have reviewed the patient's current medications. Scheduled: . aspirin EC  325 mg Oral Daily  . atorvastatin  40 mg Oral q1800  . donepezil  5 mg Oral QHS  . enoxaparin (LOVENOX) injection  40 mg Subcutaneous Q24H  . insulin aspart  0-9 Units Subcutaneous TID WC  . iron polysaccharides  150 mg Oral Daily  . metoprolol succinate  50 mg Oral Daily  . pantoprazole  40 mg Oral Daily  . risperiDONE  0.5 mg Oral QHS    Assessment/Plan: 81 year old female admitted with acute infarct and left sided weakness with worsening weakness noted this AM but nursing.  Patient appears to be back at her presentation level of weakness.  Initial MRI shows a right lentiform nucleus infarct.  Has had a recent stroke work up including echocardiogram and carotid doppler that were unremarkable.  LDL and A1c elevated.  Patient on ASA.  Is being considered for NOAC due to history of atrial fibrillation but has been considered a fall risk in the past.  Head CT personally reviewed and shows no area of hemorrhage.  Area of presenting infarct now more prominent likely representing evolution.    Recommendations: 1.  Continue on ASA 2.  Further decision to be made concerning need of NOAC 3.  Stroke risk factor management   LOS: 1 day   Alexis Goodell, MD [facility] [phone] 08/22/2015  6:37 AM

## 2015-08-22 NOTE — Progress Notes (Signed)
Occupational Therapy Evaluation Patient Details Name: [name] MRN: [id number] DOB: [birth year] Today's Date: 08/22/2015    History of Present Illness Pt is an 81 y/o female with PMH of CAD, HTN, HLD, TIA, and Type 2 DM. She presented to the ED with slurred speech, increased weakness, gait instability, and headache.  She also had a fall. Imaging revealed acute nonhemorrhagic infarct of the R basal ganglia.   Clinical Impression   Pt admitted with the above diagnoses and presents with below problem list. Pt will benefit from continued acute OT to address the below listed deficits and maximize independence with BADLs prior to d/c to venue below. PTA pt was min guard for bathing/dressing and completed functional mobility with no AD. Pt presents this session with significant LUE weakness, generalized weakness, and impaired balance impacting level of assist with ADLs. Pt is currently mod to max A with ADLs and sit<>stand transfers. Functional mobility not assessed this session due to poor standing balance and decreased activity tolerance. Of note, daughter reports LUE was worse during OT session than it had been earlier this morning. Daughter stated, "Just a little while ago she could raise her arm some." Nursing notified. Session details below. OT to continue to follow acutely.      Follow Up Recommendations  SNF    Equipment Recommendations  Other (comment) (defer to next venue)    Recommendations for Other Services       Precautions / Restrictions Precautions Precautions: Fall Restrictions Weight Bearing Restrictions: No      Mobility Bed Mobility Overal bed mobility: Needs Assistance Bed Mobility: Rolling;Sidelying to Sit Rolling: Min assist (to bring L LE across body and initiate trunk rolling) Sidelying to sit: Mod assist (for trunk support and LE management)       General bed mobility comments: up in chair  Transfers Overall transfer level: Needs  assistance Equipment used: Rolling walker (2 wheeled) Transfers: Sit to/from Stand Sit to Stand: Mod assist         General transfer comment: mod A impacted by significant LUE weakness during session. Cues for sequencing to scoot forward prior to standing. Pt unable to place Lt hand on arm rest to facilitate standing.     Balance Overall balance assessment: Needs assistance;History of Falls Sitting-balance support: No upper extremity supported;Feet supported Sitting balance-Leahy Scale: Poor Sitting balance - Comments: fatigues easily Postural control: Posterior lean Standing balance support: Single extremity supported;During functional activity Standing balance-Leahy Scale: Poor Standing balance comment: mod A and RUE on rw. LUE placed on rw by therapist with pt not able to really use functionally to assist. Stood < 1 minute. Cues for more upright posture. Assist to position LLE in standing with pt noted to have decreased knee flexion.                             ADL Overall ADL's : Needs assistance/impaired Eating/Feeding: Set up;Sitting Eating/Feeding Details (indicate cue type and reason): using RUE Grooming: Brushing hair;Set up;Sitting Grooming Details (indicate cue type and reason): used RUE Upper Body Bathing: Sitting;Maximal assistance   Lower Body Bathing: Moderate assistance;Sit to/from stand   Upper Body Dressing : Sitting;Maximal assistance   Lower Body Dressing: Moderate assistance;Sit to/from Health and safety inspector Details (indicate cue type and reason): did not attempt due to poor standing balance   Toileting - Clothing Manipulation Details (indicate cue type and reason): did not attempt due to poor standing balance  Tub/Shower Transfer Details (indicate cue type and reason): did not attempt due to poor standing balance Functional mobility during ADLs:  (did not attempt due to poor standing balance) General ADL Comments: Pt with significant LUE  weakness during session and difficulty positioning LLE during standing. Pt completed grooming sitting in recliner using RUE, could not complete with LUE. Daughters present for session and discussed SNF recommendation.      Vision Additional Comments: Pt able to read therapist name badge. Denied diplopia or bluriness when reading close up and at a distance.    Perception     Praxis      Pertinent Vitals/Pain Pain Assessment: No/denies pain Faces Pain Scale: Hurts little more Pain Location: stomach Pain Descriptors / Indicators: Aching Pain Intervention(s): Monitored during session     Hand Dominance Right   Extremity/Trunk Assessment Upper Extremity Assessment Upper Extremity Assessment: Generalized weakness;LUE deficits/detail LUE Deficits / Details: unable to initiate Lt shoulder flexion with multimodal cues provided; in gravity eliminated position able to complete full range of elbow flexion/extension with no resistance; able to shrug left shoulder and hold against light resistance; grip strength absent LUE Coordination: decreased fine motor;decreased gross motor   Lower Extremity Assessment Lower Extremity Assessment: Defer to PT evaluation LLE Deficits / Details:    Cervical / Trunk Assessment Cervical / Trunk Assessment: Kyphotic   Communication Communication Communication: Other (comment) (slurred speech, very quiet)   Cognition Arousal/Alertness: Awake/alert Behavior During Therapy: Flat affect Overall Cognitive Status: History of cognitive impairments - at baseline Area of Impairment: Safety/judgement;Problem solving         Safety/Judgement: Decreased awareness of safety;Decreased awareness of deficits   Problem Solving: Slow processing;Difficulty sequencing;Requires verbal cues;Requires tactile cues     General Comments       Exercises       Shoulder Instructions      Home Living Family/patient expects to be discharged to:: Private residence Living  Arrangements: Children Available Help at Discharge: Family;Available 24 hours/day Type of Home: House Home Access: Level entry     Home Layout: Two level;Able to live on main level with bedroom/bathroom     Bathroom Shower/Tub: Teacher, early years/pre: Standard     Home Equipment: Other (comment) (had a RW, not sure if they still have it)          Prior Functioning/Environment Level of Independence: Needs assistance    ADL's / Homemaking Assistance Needed: family stands by during shower, gets her meals and manages her meds        OT Diagnosis: Generalized weakness;Hemiplegia non-dominant side   OT Problem List: Decreased strength;Decreased range of motion;Decreased activity tolerance;Impaired balance (sitting and/or standing);Decreased coordination;Decreased safety awareness;Decreased cognition;Decreased knowledge of use of DME or AE;Decreased knowledge of precautions;Impaired sensation;Impaired tone;Impaired UE functional use   OT Treatment/Interventions: Self-care/ADL training;Therapeutic exercise;Neuromuscular education;Energy conservation;DME and/or AE instruction;Therapeutic activities;Patient/family education;Balance training    OT Goals(Current goals can be found in the care plan section) Acute Rehab OT Goals Patient Stated Goal: none stated OT Goal Formulation: With patient/family Time For Goal Achievement: 09/05/15 Potential to Achieve Goals: Good ADL Goals Pt Will Perform Grooming: with modified independence;sitting (incorporating LUE for bilateral tasks) Pt Will Perform Upper Body Bathing: with min assist;sitting Pt Will Perform Lower Body Bathing: with min assist;sit to/from stand;sitting/lateral leans Pt Will Perform Upper Body Dressing: with min assist;sitting Pt Will Perform Lower Body Dressing: with min assist;sitting/lateral leans;sit to/from stand Pt Will Transfer to Toilet: with min assist;stand pivot transfer;bedside commode Pt Will  Perform  Toileting - Clothing Manipulation and hygiene: with min assist;sit to/from stand;sitting/lateral leans Pt/caregiver will Perform Home Exercise Program: Increased ROM;Increased strength;Left upper extremity;With written HEP provided Additional ADL Goal #1: Pt will complete bed mobility at min A level to prepare for OOB ADLs.   OT Frequency: Min 2X/week   Barriers to D/C:            Co-evaluation              End of Session Equipment Utilized During Treatment: Gait belt;Rolling walker Nurse Communication: Other (comment) (LUE weakness)  Activity Tolerance: Patient limited by fatigue;Patient tolerated treatment well Patient left: in chair;with call bell/phone within reach;with chair alarm set;with family/visitor present   Time: 8101-7510 OT Time Calculation (min): 27 min Charges:  OT General Charges $OT Visit: 1 Procedure OT Evaluation $Initial OT Evaluation Tier I: 1 Procedure OT Treatments $Self Care/Home Management : 8-22 mins G-Codes:    Hortencia Pilar 08-27-15, 10:19 AM

## 2015-08-22 NOTE — Progress Notes (Signed)
ANTICOAGULATION CONSULT NOTE - Initial Consult  Pharmacy Consult for Apixaban Indication: atrial fibrillation and stroke  Allergies  Allergen Reactions  . Penicillins Swelling    REACTION: swelling of joints  . Sulfonamide Derivatives Other (See Comments)    REACTION: "like my head was full of water"  . Albumin (Human) Other (See Comments)    Doesn't remember   . Clindamycin Other (See Comments)    Doesn't remember   . Iodides Hives  . Iodinated Diagnostic Agents Hives  . Sulfa Antibiotics Hives  . Celecoxib Itching and Rash  . Erythromycin Itching and Rash  . Nitrofuran Derivatives Rash  . Nitrofurantoin Itching and Rash  . Piroxicam Other (See Comments)    unknown  . Povidone-Iodine Itching and Rash    Patient Measurements: Height: 5\' 2"  (157.5 cm) Weight: 110 lb 6.4 oz (50.077 kg) IBW/kg (Calculated) : 50.1  Vital Signs: Temp: 98.7 F (37.1 C) (10/11 0929) Temp Source: Oral (10/11 0929) BP: 118/54 mmHg (10/11 0929) Pulse Rate: 78 (10/11 0929)  Labs:  Recent Labs  08/20/15 1625 08/21/15 0544  HGB 11.2* 10.6*  HCT 33.7* 32.6*  PLT 235 214  CREATININE 1.14* 0.95    Estimated Creatinine Clearance: 32.4 mL/min (by C-G formula based on Cr of 0.95).   Medical History: Past Medical History  Diagnosis Date  . Arrhythmia     paroxysmal atrial fibrillation  . Coronary artery disease   . Hypertension   . Hyperlipidemia   . Edema   . GERD (gastroesophageal reflux disease)   . Anemia B twelve deficiency   . Dizziness - giddy   . Abdominal pain   . Iron deficiency anemia, unspecified   . Pernicious anemia   . Thoracic or lumbosacral neuritis or radiculitis, unspecified   . Symptomatic menopausal or female climacteric states   . Lumbago   . Benign neoplasm of colon   . Allergic rhinitis, cause unspecified   . Type II diabetes mellitus ([place])   . Degeneration of lumbar or lumbosacral intervertebral disc   . Arthritis     "arms and hands" (06/30/2013)    Assessment:  To begin Apixaban for atrial fibrillation and new stroke.   Not on anticoagulants prior to admission due to fall risk.  Physicians have discussed risk/benefit of anticoagulation with family.   Received Lovenox 40 mg for VTE prophylaxis on 10/10 ~12noon.     81 yrs old, 50 kg.  For low-dose Apixaban.  Aspirin has been discontinued. 325 mg dose given this am.  Goal of Therapy:  appropriate Apixaban dose for renal function and indication Monitor platelets by anticoagulation protocol: Yes   Plan:   Lovenox discontinued. Not given today.  Begin Apixaban 2.5 mg BID.  Intermittent CBC.  Arty Baumgartner, [place] Pager: [phone] 08/22/2015,12:15 PM

## 2015-08-22 NOTE — Evaluation (Signed)
Clinical/Bedside Swallow Evaluation Patient Details  Name: [name] MRN: [id number] Date of Birth: 23-Dec-1924  Today's Date: 08/22/2015 Time: SLP Start Time (ACUTE ONLY): 1450 SLP Stop Time (ACUTE ONLY): 1505 SLP Time Calculation (min) (ACUTE ONLY): 15 min  Past Medical History:  Past Medical History  Diagnosis Date  . Arrhythmia     paroxysmal atrial fibrillation  . Coronary artery disease   . Hypertension   . Hyperlipidemia   . Edema   . GERD (gastroesophageal reflux disease)   . Anemia B twelve deficiency   . Dizziness - giddy   . Abdominal pain   . Iron deficiency anemia, unspecified   . Pernicious anemia   . Thoracic or lumbosacral neuritis or radiculitis, unspecified   . Symptomatic menopausal or female climacteric states   . Lumbago   . Benign neoplasm of colon   . Allergic rhinitis, cause unspecified   . Type II diabetes mellitus ([place])   . Degeneration of lumbar or lumbosacral intervertebral disc   . Arthritis     "arms and hands" (06/30/2013)   Past Surgical History:  Past Surgical History  Procedure Laterality Date  . Back surgery    . Cholecystectomy    . Appendectomy  1974  . Abdominal hysterectomy  1974  . Carpal tunnel release Bilateral 1970's  . Cataract extraction, bilateral Bilateral   . Cardiac catheterization    . Coronary angioplasty with stent placement      "1" (06/30/2013  . Lumbar disc surgery     HPI:  Pt is an 81 y/o female with PMH of CAD, HTN, HLD, TIA, and Type 2 DM. She presented to the ED with slurred speech, increased weakness, gait instability, and headache. She also had a fall. Imaging revealed acute nonhemorrhagic infarct of the R basal ganglia.   Assessment / Plan / Recommendation Clinical Impression  Bedside swallow evaluation complete.  Patient demonstrates a moderate dysphagia characterized by oral motor weakness with left deficits that are greater than right as well as poor awareness of the bolus.  These deficits  result in impaired mastication with oral reside and pocketing, anterior loss of boluses and poor coordination.  Nectar-thick liquids effective at preventing anterior loss and reducing the number of swallows.  As a result, Dys.2 textures with nectar-thick liquids are recommended with full supervision for recall and utilization of strategies to maximize safety with PO intake.  Patient will require skilled SLP services at this and the next level of care.     Aspiration Risk  Mild    Diet Recommendation Dysphagia 2 (Fine chop);Nectar   Medication Administration: Whole meds with puree Compensations: Slow rate;Small sips/bites;Check for pocketing    Other  Recommendations Oral Care Recommendations: Oral care BID Other Recommendations: Order thickener from pharmacy;Prohibited food (jello, ice cream, thin soups);Remove water pitcher;Have oral suction available   Follow Up Recommendations  24/7 with home health versus SNF   Frequency and Duration min 2x/week  2 weeks   Pertinent Vitals/Pain None    SLP Swallow Goals  See care plan    Swallow Study Prior Functional Status  Unknown     General Date of Onset: 08/20/15 Other Pertinent Information: Pt is an 81 y/o female with PMH of CAD, HTN, HLD, TIA, and Type 2 DM. She presented to the ED with slurred speech, increased weakness, gait instability, and headache. She also had a fall. Imaging revealed acute nonhemorrhagic infarct of the R basal ganglia. Type of Study: Bedside swallow evaluation Previous Swallow Assessment: none on  record Diet Prior to this Study: NPO (regular and thin earlier in the day ) Temperature Spikes Noted: No Respiratory Status: Room air History of Recent Intubation: No Behavior/Cognition: Cooperative;Confused;Lethargic/Drowsy;Distractible;Requires cueing Oral Cavity - Dentition: Poor condition;Missing dentition Self-Feeding Abilities: Able to feed self;Needs assist;Needs set up Patient Positioning: Upright in  chair/Tumbleform Baseline Vocal Quality: Low vocal intensity Volitional Cough: Weak Volitional Swallow: Able to elicit    Oral/Motor/Sensory Function Overall Oral Motor/Sensory Function: Impaired (left deficits greather than right) Labial ROM: Reduced right;Reduced left Labial Strength: Reduced Labial Sensation: Reduced Lingual ROM: Reduced right;Reduced left Lingual Symmetry: Abnormal symmetry left Lingual Strength: Reduced Lingual Sensation: Reduced Facial ROM: Reduced right;Reduced left Facial Symmetry: Right droop;Left droop Facial Strength: Reduced   Ice Chips Ice chips: Impaired Presentation: Spoon Oral Phase Impairments: Poor awareness of bolus Pharyngeal Phase Impairments: Other (comments) (multiple swallows)   Thin Liquid Thin Liquid: Impaired Presentation: Cup;Self Fed;Straw Oral Phase Impairments: Reduced labial seal;Poor awareness of bolus Oral Phase Functional Implications: Left anterior spillage Pharyngeal  Phase Impairments: Multiple swallows    Nectar Thick Nectar Thick Liquid: Impaired Presentation: Cup;Self Fed;Straw Oral Phase Impairments: Poor awareness of bolus   Honey Thick Honey Thick Liquid: Not tested   Puree Puree: Impaired Presentation: Self Fed;Spoon Pharyngeal Phase Impairments: Multiple swallows   Solid   GO    Solid: Impaired Oral Phase Impairments: Impaired anterior to posterior transit;Poor awareness of bolus;Impaired mastication Oral Phase Functional Implications: Left anterior spillage;Right lateral sulci pocketing;Left lateral sulci pocketing Pharyngeal Phase Impairments: Multiple swallows Other Comments: puree required to reduce residue      Gunnar Fusi, M.A., CCC-SLP [phone]   Gwenn Teodoro 08/22/2015,3:42 PM

## 2015-08-22 NOTE — Progress Notes (Addendum)
RN paged this NP secondary to pt having acute change in neuro status with worsening movement and strength. (pt with known stroke this admit). Told RN to call code stroke and neuro doc immediately. Paged my colleague, Lamar Blinks, NP who is in house at [facility] to go to bedside. Ordered STAT CT head to f/up and make sure pt has not converted to hemorrhagic stroke.  KJKG, NP [facility] Called RN back to make sure Code stroke called and it was. Neuro knows. Pt going to CT now. NIH stat.  KJKG, NP [facility]

## 2015-08-22 NOTE — Progress Notes (Signed)
Shift event note:  Notified by my colleague Lurlean Leyden, NP-C that page rec'd regarding pt w/ worsening stroke type symptoms. A Code Stroke was called and neurology paged. At bedside noted Dr Doy Mince with neurology service at bedside assessing pt. Per Dr Doy Mince Code Stroke cancelled but will proceed w/ Ct head w/o cm (See neuro note). RR RN has arrived to floor. Pt to be transported to ct in bed.   Jeryl Columbia, NP-C [facility] Pager [phone]

## 2015-08-22 NOTE — Clinical Social Work Placement (Signed)
   CLINICAL SOCIAL WORK PLACEMENT  NOTE  Date:  08/22/2015  Patient Details  Name: [name] MRN: [id number] Date of Birth: May 13, 1925  Clinical Social Work is seeking post-discharge placement for this patient at the [place] level of care (*CSW will initial, date and re-position this form in  chart as items are completed):  Yes   Patient/family provided with [place] Work Department's list of facilities offering this level of care within the geographic area requested by the patient (or if unable, by the patient's family).  Yes   Patient/family informed of their freedom to choose among providers that offer the needed level of care, that participate in Medicare, Medicaid or managed care program needed by the patient, have an available bed and are willing to accept the patient.  Yes   Patient/family informed of [facility]'s ownership interest in [facility] and [facility], as well as of the fact that they are under no obligation to receive care at these facilities.  PASRR submitted to EDS on 08/22/15     PASRR number received on 08/22/15     Existing PASRR number confirmed on       FL2 transmitted to all facilities in geographic area requested by pt/family on 08/22/15     FL2 transmitted to all facilities within larger geographic area on       Patient informed that his/her managed care company has contracts with or will negotiate with certain facilities, including the following:            Patient/family informed of bed offers received.  Patient chooses bed at       Physician recommends and patient chooses bed at      Patient to be transferred to   on  .  Patient to be transferred to facility by       Patient family notified on   of transfer.  Name of family member notified:        PHYSICIAN Please sign FL2     Additional Comment:    _______________________________________________ Leane Call,  Student-SW 08/22/2015, 11:38 AM

## 2015-08-22 NOTE — Progress Notes (Signed)
Pt noted to have an absent left hand grip and left arm was flaccid upon assessment which appears to be new sx. Left foot dragging and wasn't able to lift left leg for ambulation.Code stroke was called.  NP notified and Neuro was notified with order for stat CT.

## 2015-08-22 NOTE — Clinical Social Work Note (Signed)
Clinical Social Work Assessment  Patient Details  Name: [name] MRN: [id number] Date of Birth: 02/15/1925  Date of referral:  08/22/15               Reason for consult:  Facility Placement                Permission sought to share information with:  Family Supports Permission granted to share information::  Yes, Verbal Permission Granted  Name::      (Patty Burt Knack, Publishing rights manager, Bradly Chris)  Agency::   (n/a)  Relationship::   (Daughters)  Sport and exercise psychologist Information:   (Ismay Arnott-[phone], [place])  Housing/Transportation Living arrangements for the past 2 months:  [place] of Information:  Adult Children Patient Interpreter Needed:  None Criminal Activity/Legal Involvement Pertinent to Current Situation/Hospitalization:  No - Comment as needed Significant Relationships:  Adult Children Lives with:  Adult Children Do you feel safe going back to the place where you live?  Yes Need for family participation in patient care:  Yes (Comment)  Care giving concerns:   Patient's family expressed concerns about accurate care given at [facility] facilities.    Social Worker assessment / plan:    BSW Intern met with Patient's daughters at bedside to discuss SNF facility placement recommended by PT. BSW intern explained the process of SNF placement with patient's daughters. Patient's daughter's were understanding of mother's further medical needs. BSW Intern offered family SNF list to review. Patient's family was very understanding and receptive. BSW intern made family aware that patient will only need short-term rehab. Family informed BSW intern that patient does not live alone and therefore will have 24 hour care once discharge from SNF. BSW intern has faxed patient clinicals to all [facility] facilities upon family's approval. Since patient does not have any past history with SNF facilities, family expressed concerns with  accurate care given once patient is placed. BSW intern will remain available until patient is medically stable for discharge.   Employment status:  Retired Forensic scientist:  Medicare PT Recommendations:  [place] / Referral to community resources:  [place]  Patient/Family's Response to care:   Patient was alert x1. Family was very polite and understanding during BSW intern assessment. Patient's family is extremely supportive and was agreeable with PT recommendations.   Patient/Family's Understanding of and Emotional Response to Diagnosis, Current Treatment, and Prognosis:   Patient's family was knowledgeable of mother's further medical care. Family appreciated visit made by [facility] intern. Patient's family remained calm and appropriate during BSW intern assessment.   Emotional Assessment Appearance:  Appears stated age Attitude/Demeanor/Rapport:  Unable to Assess Affect (typically observed):  Unable to Assess Orientation:  Oriented to Self Alcohol / Substance use:  Not Applicable Psych involvement (Current and /or in the community):  No (Comment)  Discharge Needs  Concerns to be addressed:  No discharge needs identified Readmission within the last 30 days:  No Current discharge risk:  None Barriers to Discharge:  No Barriers Identified   Leane Call, Student-SW 08/22/2015, 11:14 AM

## 2015-08-23 ENCOUNTER — Ambulatory Visit: Payer: Medicare Other | Admitting: Endocrinology

## 2015-08-23 DIAGNOSIS — E1121 Type 2 diabetes mellitus with diabetic nephropathy: Secondary | ICD-10-CM

## 2015-08-23 LAB — GLUCOSE, CAPILLARY
Glucose-Capillary: 130 mg/dL — ABNORMAL HIGH (ref 65–99)
Glucose-Capillary: 167 mg/dL — ABNORMAL HIGH (ref 65–99)
Glucose-Capillary: 123 mg/dL — ABNORMAL HIGH (ref 65–99)
Glucose-Capillary: 136 mg/dL — ABNORMAL HIGH (ref 65–99)
Glucose-Capillary: 206 mg/dL — ABNORMAL HIGH (ref 65–99)

## 2015-08-23 MED ORDER — METOPROLOL SUCCINATE ER 25 MG PO TB24
75.0000 mg | ORAL_TABLET | Freq: Every day | ORAL | Status: DC
  Administered 2015-08-24: 75 mg via ORAL
  Filled 2015-08-23: qty 3

## 2015-08-23 NOTE — Progress Notes (Signed)
Occupational Therapy Treatment Patient Details Name: [name] MRN: [id number] DOB: 09/15/1925 Today's Date: 08/23/2015    History of present illness Pt is an 81 y/o female with PMH of CAD, HTN, HLD, TIA, and Type 2 DM. She presented to the ED with slurred speech, increased weakness, gait instability, and headache.  She also had a fall. Imaging revealed acute nonhemorrhagic infarct of the R basal ganglia.   OT comments  Pt progresing towards acute OT goals. Focus of session was SPT toilet transfer, completed with +2 mod A, and LUE HEP and education on LUE positioning. Pt continues to present with significant LUE weakness,difficulty advancing LLE during SPTs, poor sitting balance, and decreased activity tolerance. Left side neglect noted this session along with a right gaze preference. Daughter present during session. Session details below. OT to continue to follow acutely. D/c plan remains appropriate.    Follow Up Recommendations  SNF    Equipment Recommendations  Other (comment)    Recommendations for Other Services      Precautions / Restrictions Precautions Precautions: Fall Restrictions Weight Bearing Restrictions: No       Mobility Bed Mobility Overal bed mobility: Needs Assistance Bed Mobility: Supine to Sit;Sit to Supine     Supine to sit: Mod assist;HOB elevated Sit to supine: Max assist   General bed mobility comments: Assist to power up trunk and for sequencing. Assist to advance BLE up onto bed.  Transfers Overall transfer level: Needs assistance Equipment used: Rolling walker (2 wheeled) Transfers: Sit to/from [facility] Sit to Stand: Mod assist Stand pivot transfers: Mod assist;+2 physical assistance       General transfer comment: Pt moving easier with 2+ HHA. Mod assist for powerup and balance. Tactile, verbal and occasional physical assist to advance LLE. cues for sequencing.    Balance Overall balance assessment: Needs  assistance Sitting-balance support: Single extremity supported;Feet supported Sitting balance-Leahy Scale: Poor Sitting balance - Comments: posterior lean, fatigues easily. corrects posture with cues but unable to maintain for very long Postural control: Posterior lean Standing balance support: Bilateral upper extremity supported;During functional activity Standing balance-Leahy Scale: Poor Standing balance comment: attempted initially with +1 and rw from EOB. LLE/LUE noted. Second attempt with +2 HHA assist and no rw, pt moved easier with this setup.                    ADL Overall ADL's : Needs assistance/impaired                         Toilet Transfer: +2 for physical assistance;Moderate assistance;Stand-pivot;BSC Toilet Transfer Details (indicate cue type and reason): 2 person HHA mod A for powerup and balance. Pt with weakness throughout LUE/LLE. Verbal and tactile cues and at times assist to position and advance LLE. Cues for sequencing. Attempted initially with +1 and rw. Pt unable to raise arm to place onto rw with multimodal cues provided.  Toileting- Clothing Manipulation and Hygiene: Moderate assistance;Sit to/from stand;+2 for safety/equipment Toileting - Clothing Manipulation Details (indicate cue type and reason): assist for balance and pericare with pt initially completely in sitting position       General ADL Comments: Pt with heavy posterior lean sitting EOB mod varbal cues to correct posture. Fatigues easily. SPT to toilet as detailed above. Pt noted to neglect LUE during session. Lt elbow flexion AROM to about 20 degrees. Able to comlete Lt forearm supination/pronation. Able to grossly grasp items placed in hand in gravity  eliminated position and squeeze with multimodal cues provided. Educated pt and daughter on AAROM exercise for LUE and issued therapy ball to work on gross grasp strength. Educated on positioning of LUE and cueing pt to "find your arm." Pt  noted to have right gaze preference.      Vision                 Additional Comments: right gaze preference noted    Perception     Praxis      Cognition   Behavior During Therapy: Flat affect Overall Cognitive Status: Impaired/Different from baseline Area of Impairment: Attention;Safety/judgement;Problem solving          Safety/Judgement: Decreased awareness of safety;Decreased awareness of deficits   Problem Solving: Slow processing;Difficulty sequencing;Requires verbal cues;Requires tactile cues      Extremity/Trunk Assessment               Exercises     Shoulder Instructions       General Comments      Pertinent Vitals/ Pain       Pain Assessment: No/denies pain  Home Living                                          Prior Functioning/Environment              Frequency Min 2X/week     Progress Toward Goals  OT Goals(current goals can now be found in the care plan section)  Progress towards OT goals: Progressing toward goals  Acute Rehab OT Goals Patient Stated Goal: none stated OT Goal Formulation: With patient/family Time For Goal Achievement: 09/05/15 Potential to Achieve Goals: Good ADL Goals Pt Will Perform Grooming: with modified independence;sitting Pt Will Perform Upper Body Bathing: with min assist;sitting Pt Will Perform Lower Body Bathing: with min assist;sit to/from stand;sitting/lateral leans Pt Will Perform Upper Body Dressing: with min assist;sitting Pt Will Perform Lower Body Dressing: with min assist;sitting/lateral leans;sit to/from stand Pt Will Transfer to Toilet: with min assist;stand pivot transfer;bedside commode Pt Will Perform Toileting - Clothing Manipulation and hygiene: with min assist;sit to/from stand;sitting/lateral leans Pt/caregiver will Perform Home Exercise Program: Increased ROM;Increased strength;Left upper extremity;With written HEP provided Additional ADL Goal #1: Pt will  complete bed mobility at min A level to prepare for OOB ADLs.   Plan Discharge plan remains appropriate    Co-evaluation                 End of Session Equipment Utilized During Treatment: Gait belt;Rolling walker   Activity Tolerance Patient limited by fatigue   Patient Left in bed;with call bell/phone within reach;with bed alarm set;with family/visitor present   Nurse Communication Mobility status;Other (comment) (LUE neglect noted)        Time: 3151-7616 OT Time Calculation (min): 37 min  Charges: OT General Charges $OT Visit: 1 Procedure OT Treatments $Self Care/Home Management : 23-37 mins  Hortencia Pilar 08/23/2015, 1:02 PM

## 2015-08-23 NOTE — Progress Notes (Signed)
[facility] PROGRESS NOTE  Venice Debski HQI:[id number] DOB: 24-Sep-1925 DOA: 08/20/2015 PCP: Renato Shin, MD  Subjective: Today the patient is doing well. She remains in bed, but is alert and responsive to questions. Speech remains slow. No complaints.    Assessment/Plan: Principal Problem: Acute right basal ganglia CVA: Mild left upper extremity weakness. Recent 2-D echocardiogram (07/12/15) negative for significant embolic source, recent carotid Doppler (07/14/15) negative for significant stenosis. Does have history of atrial fibrillation-reviewed outpatient cardiology note on October 2015-deemed not to be a candidate for anticoagulation. Discussed starting Eliquis with family, they agreed to proceed with anticoagulation and understand the risks/benefits. Initiated Eliquis 2.5 BID on 13/24 without complications. LDL 150 (goal <70) and A1c 8.0 (see below). Patient is stable today, continue to monitor.   Active Problems: Dyslipidemia: LDL 150 (goal <70), continue statin therapy and monitor as outpatient.   Diabetes mellitus type 2, controlled: CBGs remain stable, A1c as above. Continue SSI-resume metformin on discharge. Will need more aggressive glycemic control-but frail/elderly-we'll defer to the outpatient setting  History of paroxysmal atrial fibrillation: See above  SVT:  Multiple episodes of brief SVT over past 2 days, asymptomatic. Will increase metoprolol to 75 mg, continue to monitor.   Dysphagia: Secondary to CVA, spoke with daughter at bedside-advised of risk of aspiration-daughter understanding and wanting to continue with dysphagia 2 diet.   Hypertension: Remains stable, continue metoprolol and monitor.   Dementia: Pleasantly confused, suspect at baseline. Continue Aricept and supportive therapy.  History of CAD: Last PCI in 2011-continue aspirin, statin and beta blocker. No chest pain or shortness of breath today.  Code Status: Full  Family Communication:  Daughter at bedside  Disposition Plan: SNF tomorrow    Consultants:  Neurology   Procedures:  None  Antibiotics: None     Objective: Filed Vitals:   08/23/15 1009  BP: 106/56  Pulse: 72  Temp: 98.4 F (36.9 C)  Resp: 17   No intake or output data in the 24 hours ending 08/23/15 1140 Filed Weights   08/20/15 1424 08/20/15 2329  Weight: 50.803 kg (112 lb) 50.077 kg (110 lb 6.4 oz)    Exam:   General:  Frail-appearing elderly woman, laying in bed in no acute distress  Cardiovascular: RRR, no m/r/g. No peripheral edema   Respiratory: CTA b/l  Abdomen: Soft, non-tender, non-distended  Neuro: A&Ox3, RUL/RLE (5/5), LUE/LLE (4/5). No other deficits   Data Reviewed: Basic Metabolic Panel:  Recent Labs Lab 08/19/15 0813 08/20/15 1625 08/21/15 0544  NA 139 137 136  K 4.5 4.2 4.3  CL 105 101 104  CO2  --  23 18*  GLUCOSE 156* 186* 147*  BUN 31* 26* 21*  CREATININE 1.10* 1.14* 0.95  CALCIUM  --  9.6 9.2   Liver Function Tests: No results for input(s): AST, ALT, ALKPHOS, BILITOT, PROT, ALBUMIN in the last 168 hours. No results for input(s): LIPASE, AMYLASE in the last 168 hours. No results for input(s): AMMONIA in the last 168 hours. CBC:  Recent Labs Lab 08/19/15 0813 08/20/15 1625 08/21/15 0544  WBC  --  6.1 4.5  NEUTROABS  --  3.2  --   HGB 12.6 11.2* 10.6*  HCT 37.0 33.7* 32.6*  MCV  --  89.2 88.6  PLT  --  235 214   Cardiac Enzymes: No results for input(s): CKTOTAL, CKMB, CKMBINDEX, TROPONINI in the last 168 hours. BNP (last 3 results) No results for input(s): BNP in the last 8760 hours.  ProBNP (last 3 results) No  results for input(s): PROBNP in the last 8760 hours.  CBG:  Recent Labs Lab 08/22/15 0629 08/22/15 1126 08/22/15 1622 08/22/15 2110 08/23/15 0647  GLUCAP 151* 130* 160* 95 167*    Recent Results (from the past 240 hour(s))  Urine culture     Status: None   Collection Time: 08/20/15  5:17 PM  Result Value Ref  Range Status   Specimen Description URINE, CLEAN CATCH  Final   Special Requests Normal  Final   Culture MULTIPLE SPECIES PRESENT, SUGGEST RECOLLECTION  Final   Report Status 08/21/2015 FINAL  Final     Studies: Ct Head Wo Contrast  08/22/2015  CLINICAL DATA:  Code stroke.  Left-sided weakness. EXAM: CT HEAD WITHOUT CONTRAST TECHNIQUE: Contiguous axial images were obtained from the base of the skull through the vertex without intravenous contrast. COMPARISON:  CT head 08/19/2015 MRI 08/20/2015. FINDINGS: Mild diffuse cerebral atrophy. Ventricular dilatation consistent with central atrophy. Cavum septum pellucidum. Low-attenuation changes in the deep white matter likely representing small vessel ischemia. Low-attenuation changes in the right lentiform nucleus corresponding to changes on prior MRI. This is more prominent on previous CT scan. Increased prominence may be due to normal interval evolution of existing infarct. No mass effect or midline shift. No abnormal extra-axial fluid collections. Gray-white matter junctions are distinct. Basal cisterns are not effaced. No evidence of acute intracranial hemorrhage. No depressed skull fractures. Mucosal thickening in the paranasal sinuses. Mastoid air cells are not opacified. Vascular calcifications. IMPRESSION: Low-attenuation changes in the deep white matter bilaterally consistent with small vessel ischemia. Developing increase low-attenuation changes in the right lentiform nucleus corresponding to changes on previous MRI. This is more prominent on previous CT scan, possibly due to normal interval evolution of existing infarct. No evidence of acute intracranial hemorrhage or mass effect. These results were called by telephone at the time of interpretation on 08/22/2015 at 6:46 am to Dr. Doy Mince , who verbally acknowledged these results. Electronically Signed   By: Lucienne Capers M.D.   On: 08/22/2015 06:50    Scheduled Meds: . apixaban  2.5 mg Oral  BID  . atorvastatin  40 mg Oral q1800  . donepezil  5 mg Oral QHS  . insulin aspart  0-9 Units Subcutaneous TID WC  . iron polysaccharides  150 mg Oral Daily  . metoprolol succinate  50 mg Oral Daily  . pantoprazole  40 mg Oral Daily  . risperiDONE  0.5 mg Oral QHS   Continuous Infusions:   Principal Problem:   Stroke (cerebrum) (HCC) Active Problems:   Dyslipidemia   Diabetes mellitus type 2, controlled ([place])   Uncontrolled hypertension   Fall   CVA (cerebral infarction)   PAF (paroxysmal atrial fibrillation) (HCC)   SVT (supraventricular tachycardia) ([place])      Micayla Zeltman, Student-PA  [facility] If 7PM-7AM, please contact night-coverage at [email or web address], password [facility] 08/23/2015, 11:40 AM  LOS: 2 days

## 2015-08-23 NOTE — Progress Notes (Signed)
Speech Language Pathology Treatment: Dysphagia;Cognitive-Linquistic  Patient Details Name: [name] MRN: [id number] DOB: 1925-04-29 Today's Date: 08/23/2015 Time: 0920-0940 SLP Time Calculation (min) (ACUTE ONLY): 20 min  Assessment / Plan / Recommendation Clinical Impression  Skilled treatment session focused on addressing dysphagia and cognition goals. Patient oriented to self this morning; SLP re-oriented patient to location and situation.  Patient required set-up assist of PO trials as well as Max assist multimodal cues to attend to left side of body and environment during self-feeding task.  Patient with some improved oral strength and coordination today resulting in minimal left anterior loss that she was able to attend to andmanage today with Min verbal cues.  Oral holding of boluses still impacted by impaired sustained attention and patient required Max verbal cues for timely swallows.  Trials of thin liquids resulted in no overt s/s of aspiration; however, given continued inattention and oral holding continue to agree that current diet orders remain the safest option at this time.  Prognosis for advancement is based on cognitive recovery.  Daughter present for today's session and SLP initiated education regarding way to increase left attention, to maximize independence and reduce aspiration risk with patient self-feeding.  Continue with current plan of care.    HPI Other Pertinent Information: Pt is an 81 y/o female with PMH of CAD, HTN, HLD, TIA, and Type 2 DM. She presented to the ED with slurred speech, increased weakness, gait instability, and headache. She also had a fall. Imaging revealed acute nonhemorrhagic infarct of the R basal ganglia.   Pertinent Vitals Pain Assessment: No/denies pain  SLP Plan  Continue with current plan of care    Recommendations Diet recommendations: Dysphagia 2 (fine chop);Nectar-thick liquid Liquids provided via: Cup;Straw Medication  Administration: Whole meds with puree Supervision: Patient able to self feed;Full supervision/cueing for compensatory strategies;Trained caregiver to feed patient Compensations: Slow rate;Small sips/bites;Check for pocketing Postural Changes and/or Swallow Maneuvers: Seated upright 90 degrees              Oral Care Recommendations: Oral care BID Follow up Recommendations: 24 hour supervision/assistance;Skilled Nursing facility Plan: Continue with current plan of care    GO    Carmelia Roller., CCC-SLP [phone]  [place] 08/23/2015, 9:44 AM

## 2015-08-23 NOTE — Clinical Social Work Note (Signed)
Clinical Social Worker contacted patient's dtr, Patty and presented bed offers. Family chooses bed at [facility]. Facility notified.   CSW remains available as needed.   Glendon Axe, MSW, LCSWA [phone] 08/23/2015 8:37 AM

## 2015-08-24 DIAGNOSIS — I480 Paroxysmal atrial fibrillation: Secondary | ICD-10-CM | POA: Diagnosis not present

## 2015-08-24 DIAGNOSIS — I69291 Dysphagia following other nontraumatic intracranial hemorrhage: Secondary | ICD-10-CM | POA: Diagnosis not present

## 2015-08-24 DIAGNOSIS — M6281 Muscle weakness (generalized): Secondary | ICD-10-CM | POA: Diagnosis not present

## 2015-08-24 DIAGNOSIS — R41841 Cognitive communication deficit: Secondary | ICD-10-CM | POA: Diagnosis not present

## 2015-08-24 DIAGNOSIS — I6329 Cerebral infarction due to unspecified occlusion or stenosis of other precerebral arteries: Secondary | ICD-10-CM | POA: Diagnosis not present

## 2015-08-24 DIAGNOSIS — I2510 Atherosclerotic heart disease of native coronary artery without angina pectoris: Secondary | ICD-10-CM | POA: Diagnosis not present

## 2015-08-24 DIAGNOSIS — G8194 Hemiplegia, unspecified affecting left nondominant side: Secondary | ICD-10-CM | POA: Diagnosis not present

## 2015-08-24 DIAGNOSIS — K219 Gastro-esophageal reflux disease without esophagitis: Secondary | ICD-10-CM | POA: Diagnosis not present

## 2015-08-24 DIAGNOSIS — R262 Difficulty in walking, not elsewhere classified: Secondary | ICD-10-CM | POA: Diagnosis not present

## 2015-08-24 DIAGNOSIS — I6330 Cerebral infarction due to thrombosis of unspecified cerebral artery: Secondary | ICD-10-CM | POA: Diagnosis not present

## 2015-08-24 DIAGNOSIS — M545 Low back pain: Secondary | ICD-10-CM | POA: Diagnosis not present

## 2015-08-24 DIAGNOSIS — D519 Vitamin B12 deficiency anemia, unspecified: Secondary | ICD-10-CM | POA: Diagnosis not present

## 2015-08-24 DIAGNOSIS — I10 Essential (primary) hypertension: Secondary | ICD-10-CM | POA: Diagnosis not present

## 2015-08-24 DIAGNOSIS — E1122 Type 2 diabetes mellitus with diabetic chronic kidney disease: Secondary | ICD-10-CM

## 2015-08-24 DIAGNOSIS — N181 Chronic kidney disease, stage 1: Secondary | ICD-10-CM

## 2015-08-24 DIAGNOSIS — F419 Anxiety disorder, unspecified: Secondary | ICD-10-CM | POA: Diagnosis not present

## 2015-08-24 DIAGNOSIS — E119 Type 2 diabetes mellitus without complications: Secondary | ICD-10-CM | POA: Diagnosis not present

## 2015-08-24 DIAGNOSIS — R52 Pain, unspecified: Secondary | ICD-10-CM | POA: Diagnosis not present

## 2015-08-24 DIAGNOSIS — E785 Hyperlipidemia, unspecified: Secondary | ICD-10-CM | POA: Diagnosis not present

## 2015-08-24 DIAGNOSIS — Z9181 History of falling: Secondary | ICD-10-CM | POA: Diagnosis not present

## 2015-08-24 DIAGNOSIS — I69352 Hemiplegia and hemiparesis following cerebral infarction affecting left dominant side: Secondary | ICD-10-CM | POA: Diagnosis not present

## 2015-08-24 DIAGNOSIS — F0390 Unspecified dementia without behavioral disturbance: Secondary | ICD-10-CM | POA: Diagnosis not present

## 2015-08-24 DIAGNOSIS — I6789 Other cerebrovascular disease: Secondary | ICD-10-CM | POA: Diagnosis not present

## 2015-08-24 LAB — GLUCOSE, CAPILLARY
Glucose-Capillary: 193 mg/dL — ABNORMAL HIGH (ref 65–99)
Glucose-Capillary: 144 mg/dL — ABNORMAL HIGH (ref 65–99)

## 2015-08-24 MED ORDER — APIXABAN 2.5 MG PO TABS: 2.5000 mg | ORAL_TABLET | Freq: Two times a day (BID) | ORAL | Status: DC

## 2015-08-24 MED ORDER — METOPROLOL SUCCINATE ER 25 MG PO TB24: 50.0000 mg | ORAL_TABLET | Freq: Every day | ORAL | Status: DC

## 2015-08-24 MED ORDER — ATORVASTATIN CALCIUM 40 MG PO TABS: 40.0000 mg | ORAL_TABLET | Freq: Every day | ORAL | Status: DC

## 2015-08-24 NOTE — Discharge Summary (Signed)
PATIENT DETAILS Name: [name] Age: 81 y.o. Sex: female Date of Birth: [birth year] MRN: [id number]. Admitting Physician: Lily Kocher, MD [id number], Hilliard Clark, MD  Admit Date: 08/20/2015 Discharge date: 08/24/2015  Recommendations for Outpatient Follow-up:  1. Please repeat A1c and lipid panel in 3 months 2. Please check CBC and chemistries periodically 3. Please ensure follow-up with speech therapy and physical therapy while at SNF  PRIMARY DISCHARGE DIAGNOSIS:  Principal Problem:   Stroke (cerebrum) ([place]) Active Problems:   Dyslipidemia   Diabetes mellitus type 2, controlled ([place])   Uncontrolled hypertension   Fall   CVA (cerebral infarction)   PAF (paroxysmal atrial fibrillation) ([facility])   SVT (supraventricular tachycardia) ([place])      PAST MEDICAL HISTORY: Past Medical History  Diagnosis Date  . Arrhythmia     paroxysmal atrial fibrillation  . Coronary artery disease   . Hypertension   . Hyperlipidemia   . Edema   . GERD (gastroesophageal reflux disease)   . Anemia B twelve deficiency   . Dizziness - giddy   . Abdominal pain   . Iron deficiency anemia, unspecified   . Pernicious anemia   . Thoracic or lumbosacral neuritis or radiculitis, unspecified   . Symptomatic menopausal or female climacteric states   . Lumbago   . Benign neoplasm of colon   . Allergic rhinitis, cause unspecified   . Type II diabetes mellitus ([place])   . Degeneration of lumbar or lumbosacral intervertebral disc   . Arthritis     "arms and hands" (06/30/2013)    DISCHARGE MEDICATIONS: Current Discharge Medication List    START taking these medications   Details  apixaban (ELIQUIS) 2.5 MG TABS tablet Take 1 tablet (2.5 mg total) by mouth 2 (two) times daily.    atorvastatin (LIPITOR) 40 MG tablet Take 1 tablet (40 mg total) by mouth daily at 6 PM.      CONTINUE these medications which have NOT CHANGED   Details  calcium carbonate (TUMS - DOSED IN MG ELEMENTAL CALCIUM) 500 MG  chewable tablet Chew 1 tablet by mouth as needed for heartburn.    donepezil (ARICEPT) 5 MG tablet Take 1 tablet (5 mg total) by mouth at bedtime. Qty: 30 tablet, Refills: 11    FERREX 150 150 MG capsule TAKE 1 CAPSULE (150 MG TOTAL) BY MOUTH 2 (TWO) TIMES DAILY. Qty: 60 capsule, Refills: 1    metFORMIN (GLUCOPHAGE-XR) 500 MG 24 hr tablet TAKE ONE TABLET BY MOUTH TWICE DAILY Qty: 60 tablet, Refills: 0    metoprolol succinate (TOPROL-XL) 50 MG 24 hr tablet TAKE 1 TABLET (50 MG TOTAL) BY MOUTH DAILY. TAKE WITH OR IMMEDIATELY FOLLOWING A MEAL. Qty: 30 tablet, Refills: 8    triamcinolone cream (KENALOG) 0.1 % Apply 1 application topically 4 (four) times daily. As needed for rash Qty: 80 g, Refills: 3    diclofenac sodium (VOLTAREN) 1 % GEL Apply 4 g topically 4 (four) times daily. As needed for pain Qty: 100 g, Refills: 5    nitroGLYCERIN (NITROSTAT) 0.4 MG SL tablet Place 0.4 mg under the tongue every 5 (five) minutes as needed.      omeprazole (PRILOSEC) 40 MG capsule Take 40 mg by mouth daily as needed (heartburn).  Refills: 11      STOP taking these medications     aspirin 81 MG EC tablet      triamterene-hydrochlorothiazide (MAXZIDE-25) 37.5-25 MG tablet         ALLERGIES:   Allergies  Allergen Reactions  .  Penicillins Swelling    REACTION: swelling of joints  . Sulfonamide Derivatives Other (See Comments)    REACTION: "like my head was full of water"  . Albumin (Human) Other (See Comments)    Doesn't remember   . Clindamycin Other (See Comments)    Doesn't remember   . Iodides Hives  . Iodinated Diagnostic Agents Hives  . Sulfa Antibiotics Hives  . Celecoxib Itching and Rash  . Erythromycin Itching and Rash  . Nitrofuran Derivatives Rash  . Nitrofurantoin Itching and Rash  . Piroxicam Other (See Comments)    unknown  . Povidone-Iodine Itching and Rash    BRIEF HPI:  See H&P, Labs, Consult and Test reports for all details in brief, patient is a 81 year old  female with history of paroxysmal atrial fibrillation not on Coumadin who presented with slurred speech and left upper extremity weakness. Further evaluation demonstrated a acute ischemic CVA.  CONSULTATIONS:   neurology  PERTINENT RADIOLOGIC STUDIES: Ct Head Wo Contrast  08/22/2015  CLINICAL DATA:  Code stroke.  Left-sided weakness. EXAM: CT HEAD WITHOUT CONTRAST TECHNIQUE: Contiguous axial images were obtained from the base of the skull through the vertex without intravenous contrast. COMPARISON:  CT head 08/19/2015 MRI 08/20/2015. FINDINGS: Mild diffuse cerebral atrophy. Ventricular dilatation consistent with central atrophy. Cavum septum pellucidum. Low-attenuation changes in the deep white matter likely representing small vessel ischemia. Low-attenuation changes in the right lentiform nucleus corresponding to changes on prior MRI. This is more prominent on previous CT scan. Increased prominence may be due to normal interval evolution of existing infarct. No mass effect or midline shift. No abnormal extra-axial fluid collections. Gray-white matter junctions are distinct. Basal cisterns are not effaced. No evidence of acute intracranial hemorrhage. No depressed skull fractures. Mucosal thickening in the paranasal sinuses. Mastoid air cells are not opacified. Vascular calcifications. IMPRESSION: Low-attenuation changes in the deep white matter bilaterally consistent with small vessel ischemia. Developing increase low-attenuation changes in the right lentiform nucleus corresponding to changes on previous MRI. This is more prominent on previous CT scan, possibly due to normal interval evolution of existing infarct. No evidence of acute intracranial hemorrhage or mass effect. These results were called by telephone at the time of interpretation on 08/22/2015 at 6:46 am to Dr. Doy Mince , who verbally acknowledged these results. Electronically Signed   By: Lucienne Capers M.D.   On: 08/22/2015 06:50   Ct Head  Wo Contrast  08/19/2015  CLINICAL DATA:  Slurred speech. Gait disturbance. Symptoms began 1,700 yesterday. EXAM: CT HEAD WITHOUT CONTRAST TECHNIQUE: Contiguous axial images were obtained from the base of the skull through the vertex without intravenous contrast. COMPARISON:  07/22/2015 CT.  MRI 07/12/2015. FINDINGS: The brain shows mild generalized atrophy. There are mild chronic small-vessel ischemic changes of the hemispheric deep white matter. There is focal encephalomalacia at the right temporal tip. No CT evidence of acute infarction. No mass lesion, hemorrhage, hydrocephalus or extra-axial collection. The calvarium is unremarkable. Sinuses are clear. IMPRESSION: No acute finding by CT. Atrophy and mild chronic small-vessel ischemic change. Chronic encephalomalacia at the right temporal tip. Atherosclerosis of the major vessels at the base of the brain. Electronically Signed   By: Nelson Chimes M.D.   On: 08/19/2015 09:24   Mr Brain Wo Contrast (neuro Protocol)  08/20/2015  CLINICAL DATA:  Left-sided weakness. Slurred speech. Abnormal balance. EXAM: MRI HEAD WITHOUT CONTRAST TECHNIQUE: Multiplanar, multiecho pulse sequences of the brain and surrounding structures were obtained without intravenous contrast. COMPARISON:  CT head 08/19/2015.  MRI brain 07/12/2015. FINDINGS: Diffusion-weighted images demonstrate an acute nonhemorrhagic infarct involving the posterior right lentiform nucleus extending superiorly into the centrum semi bowel I. T2 changes are associated with this infarct. Mild periventricular T2 changes are again seen bilaterally. The ventricles are proportionate to the degree of atrophy. No significant extra-axial fluid collection is present. The inner ear structures are normally formed. T2 changes are again noted in the anterior pons and medulla. Flow is present in the major intracranial arteries. Bilateral lens replacements are noted. The globes and orbits are otherwise intact. Mild mucosal  thickening is present posterior ethmoid air cells bilaterally. There is some mucosal thickening in the lateral sphenoid sinuses bilaterally. The mastoid air cells are clear. IMPRESSION: 1. Acute nonhemorrhagic infarct involving the posterior right lentiform nucleus extending superiorly to the centrum semi ovale. 2. Otherwise stable periventricular T2 changes bilaterally a moderate atrophy. 3. Mild sinus disease. These results were called by telephone at the time of interpretation on 08/20/2015 at 8:49 pm to Dr. Theotis Burrow , who verbally acknowledged these results. Electronically Signed   By: San Morelle M.D.   On: 08/20/2015 20:50   Mr Jodene Nam Head/brain Wo Cm  08/21/2015  CLINICAL DATA:  Initial evaluation for stroke. EXAM: MRA HEAD WITHOUT CONTRAST TECHNIQUE: Angiographic images of the Circle of Willis were obtained using MRA technique without intravenous contrast. COMPARISON:  Prior MRI from 08/20/2015. FINDINGS: ANTERIOR CIRCULATION: Motion degraded study. The distal cervical segments of the internal carotid arteries are patent with antegrade flow. Petrous cavernous, and supraclinoid segments are patent without focal stenosis. Right A1 segment is hypoplastic. Moderate focal narrowing at the origin of the left A1 segment. Anterior communicating artery grossly normal. Anterior cerebral arteries patent bilaterally. M1 segments patent without stenosis or occlusion. MCA bifurcations within normal limits. MCA branches symmetric bilaterally. Atheromatous irregularity within the proximal MCA branches. Distal MCA branches not well evaluated on this exam secondary to motion. POSTERIOR CIRCULATION: Vertebral arteries are patent to the vertebrobasilar junction. Posterior inferior cerebral arteries not well evaluated on this exam. Basilar artery widely patent. Superior cerebellar arteries patent proximally. There is an apparent focal outpouching arising in the region of the right superior cerebellar artery  measuring approximately 3 mm, suspicious for small aneurysm. Left P1 segment widely patent. Right P1 segment is hypoplastic. Prominent right posterior communicating artery. Multi focal irregularity seen within the posterior cerebral arteries, likely related underlying atheromatous disease. No focal high-grade stenosis. Posterior cerebral arteries are opacified to their distal aspect. IMPRESSION: 1. Motion degraded study. 2. No focal severe or correctable stenosis identified within the intracranial circulation. 3. Atheromatous irregularity within the MCA and PCA branches bilaterally. 4. 3 mm focal outpouching in the region of the right superior cerebellar artery, suspicious for small aneurysm. Electronically Signed   By: Jeannine Boga M.D.   On: 08/21/2015 03:55   Dg Hip Unilat With Pelvis 2-3 Views Left  08/20/2015  CLINICAL DATA:  81 year old female with history of fall complaining of pain in the left leg for the past week. EXAM: DG HIP (WITH OR WITHOUT PELVIS) 2-3V LEFT COMPARISON:  No priors. FINDINGS: AP view of the pelvis demonstrates no acute displaced fracture of the bony pelvic ring. AP and lateral views of the left hip demonstrate no definite acute displaced fracture. Left femoral head is properly located in the acetabulum. There is joint space narrowing, subchondral sclerosis, subchondral cyst formation and osteophyte formation in the hip joints bilaterally (left greater than right), compatible with osteoarthritis. IMPRESSION: 1. No acute radiographic abnormality  of the bony pelvis or the left hip. 2. Degenerative changes of osteoarthritis in the hip joints bilaterally, as above. Electronically Signed   By: Vinnie Langton M.D.   On: 08/20/2015 18:48     PERTINENT LAB RESULTS: CBC: No results for input(s): WBC, HGB, HCT, PLT in the last 72 hours. CMET CMP     Component Value Date/Time   NA 136 08/21/2015 0544   K 4.3 08/21/2015 0544   CL 104 08/21/2015 0544   CO2 18* 08/21/2015  0544   GLUCOSE 147* 08/21/2015 0544   BUN 21* 08/21/2015 0544   CREATININE 0.95 08/21/2015 0544   CREATININE 0.86 11/14/2014 1009   CALCIUM 9.2 08/21/2015 0544   PROT 6.3* 07/22/2015 1944   ALBUMIN 3.5 07/22/2015 1944   AST 21 07/22/2015 1944   ALT 15 07/22/2015 1944   ALKPHOS 68 07/22/2015 1944   BILITOT 0.6 07/22/2015 1944   GFRNONAA 52* 08/21/2015 0544   GFRAA >60 08/21/2015 0544    GFR Estimated Creatinine Clearance: 32.4 mL/min (by C-G formula based on Cr of 0.95). No results for input(s): LIPASE, AMYLASE in the last 72 hours. No results for input(s): CKTOTAL, CKMB, CKMBINDEX, TROPONINI in the last 72 hours. Invalid input(s): POCBNP No results for input(s): DDIMER in the last 72 hours. No results for input(s): HGBA1C in the last 72 hours. No results for input(s): CHOL, HDL, LDLCALC, TRIG, CHOLHDL, LDLDIRECT in the last 72 hours. No results for input(s): TSH, T4TOTAL, T3FREE, THYROIDAB in the last 72 hours.  Invalid input(s): FREET3 No results for input(s): VITAMINB12, FOLATE, FERRITIN, TIBC, IRON, RETICCTPCT in the last 72 hours. Coags: No results for input(s): INR in the last 72 hours.  Invalid input(s): PT Microbiology: Recent Results (from the past 240 hour(s))  Urine culture     Status: None   Collection Time: 08/20/15  5:17 PM  Result Value Ref Range Status   Specimen Description URINE, CLEAN CATCH  Final   Special Requests Normal  Final   Culture MULTIPLE SPECIES PRESENT, SUGGEST RECOLLECTION  Final   Report Status 08/21/2015 FINAL  Final     BRIEF HOSPITAL COURSE:   Principal Problem: Acute right basal ganglia CVA with extension: Presented with dysarthria and mostly left upper extremity weakness 4/5. Unfortunately, during this hospital course-had extension of her underlying stroke-on day of discharge strength in the left upper extremity 0/5. Strength in the left lower extremity 3-4/5. Right upper and right lower extremity with adequate strength. Underwent a  workup which included a MRI which showed a right posterior lentiform nucleus infarct. MRA showed intracranial atherosclerosis. Recent carotid Doppler (07/14/15) negative for significant stenosis. Recent echocardiogram on 07/12/15 negative for embolic source. LDL 150 (goal <70) and A1c 8.0 (see below). Since patient had acute extension of her CVA, repeat CT head on 10/11 showed expected low attenuation changes in the right lentiform nucleus area. After much discussion with the family-regarding risks vs benefits, Eliquis was started. Please note family aware of catastrophic/life-threatening and life disabling bleeding. This M.D. spoke with Dr.Xu today, explained that some worsening of left upper extremity weakness-Dr. Xu-recommended allowing some more permissive hypertension but had no further recommendations. Okay to discharge to SNF. Subsequently spoke with daughter at bedside-explained that patient may have had further extension of CVA-and that neurology does not have any recommendations apart from allowing some permissive hypertension and discharged to SNF. Will decrease metoprolol to 50 mg, will continue to hold diuretics on discharge.   Active Problems: Dyslipidemia: LDL 150 (goal <70), continue statin  therapy and monitor as outpatient.   Diabetes mellitus type 2, controlled: CBGs remain stable, A1c as above. Managed with SSI while inpatient-resume metformin on discharge. Will need more aggressive glycemic control-but frail/elderly-we'll defer to the outpatient setting  History of paroxysmal atrial fibrillation: See above  SVT: Multiple episodes of very brief SVT during this hospitalization, asymptomatic. Although metoprolol was increased to 75 mg daily-given extension of CVA will decrease back to 50 mg.   Dysphagia: Secondary to CVA, this M.D. spoke with daughter at bedside-advised of risk of aspiration-daughter understanding and wanting to continue with dysphagia 2 diet. Please ensure follow-up with  speech therapy while at SNF  Hypertension: Remains stable, continue metoprolol and monitor. Continue to hold diuretics on discharge.  Dementia: Pleasantly confused, suspect at baseline. Continue Aricept and supportive therapy.  History of CAD: Last PCI in 2011-continue statin and beta blocker. 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 rate 15, height 5\' 2"  (1.575 m), weight 50.077 kg (110 lb 6.4 oz), SpO2 97 %.  Intake/Output Summary (Last 24 hours) at 08/24/15 1058 Last data filed at 08/24/15 0800  Gross per 24 hour  Intake     60 ml  Output      0 ml  Net     60 ml   Filed Weights   08/20/15 1424 08/20/15 2329  Weight: 50.803 kg (112 lb) 50.077 kg (110 lb 6.4 oz)    Exam Awake Alert, follows commands. Daughter at bedside Neck supple Chest bilaterally clear to auscultation Cardiovascular heart sounds regular no murmurs. Neurology-left upper extremity 0/5, left lower extremity 3-/5.Right upper and right lower 5/5  DISCHARGE CONDITION: Stable  DISPOSITION: SNF  DISCHARGE INSTRUCTIONS:    Activity:  As tolerated with Full fall precautions use walker/cane & assistance as needed  Get Medicines reviewed and adjusted: Please take all your medications with you for your next visit with your Primary MD  Please request your Primary MD to go over all hospital tests and procedure/radiological results at the follow up, please ask your Primary MD to get all Hospital records sent to his/her office.  If you experience worsening of your admission symptoms, develop shortness of breath, life threatening emergency, suicidal or homicidal thoughts you must seek medical attention  immediately by calling 911 or calling your MD immediately  if symptoms less severe.  You must read complete instructions/literature along with all the possible adverse reactions/side effects for all the Medicines you take and that have been prescribed to you. Take any new Medicines after you have completely understood and accpet all the possible adverse reactions/side effects.   Do not drive when taking Pain medications.   Do not take more than prescribed Pain, Sleep and Anxiety Medications  Special Instructions: If you have smoked or chewed Tobacco  in the last 2 yrs please stop smoking, stop any regular Alcohol  and or any Recreational drug use.  Wear Seat belts while driving.  Please note  You were cared for by a hospitalist during your hospital stay. Once you are discharged, your primary care physician will handle any further medical issues. Please note that NO REFILLS for any discharge medications will be authorized once you are discharged, as it is imperative that you return  to your primary care physician (or establish a relationship with a primary care physician if you do not have one) for your aftercare needs so that they can reassess your need for medications and monitor your lab values.   Diet recommendation: Dysphagia 3 Diet (But Diabetic Diet/Heart Healthy diet) Aspiration precautions:yes See below for further details  Discharge Instructions    Diet - low sodium heart healthy    Complete by:  As directed   Diet recommendations: Dysphagia 2 (fine chop);Nectar-thick liquid Liquids provided via: Cup;Straw Medication Administration: Whole meds with puree Supervision: Patient able to self feed;Full supervision/cueing for compensatory strategies;Trained caregiver to feed patient Compensations: Slow rate;Small sips/bites;Check for pocketing Postural Changes and/or Swallow Maneuvers: Seated upright 90 degrees     Diet Carb Modified    Complete by:  As directed      Increase  activity slowly    Complete by:  As directed            Follow-up Information    Follow up with Cameron Sprang, MD. Go on 09/18/2015.   Specialty:  Neurology   Why:  continue to follow up with [facility] Dr. Delice Lesch as scheduled.   Contact information:   [place] [address] [phone]       Follow up with Renato Shin, MD. Schedule an appointment as soon as possible for a visit in 2 weeks.   Specialty:  Endocrinology   Why:  after discharge from SNF   Contact information:   [address] [place] [address] [phone]       Total Time spent on discharge equals  45 minutes.  SignedOren Binet 08/24/2015 10:58 AM

## 2015-08-24 NOTE — Progress Notes (Signed)
Patient and family made aware of discharge to [facility]. Patient and family verbalized understanding. IV and Tele removed. Vitals stable. [facility] arrived to transport patient. All belongings sent with patient and family.  Aldona Bar, RN

## 2015-08-24 NOTE — Progress Notes (Signed)
Speech Language Pathology Treatment: Dysphagia  Patient Details Name: [name] MRN: [id number] DOB: Mar 20, 1925 Today's Date: 08/24/2015 Time: 1130-1140 SLP Time Calculation (min) (ACUTE ONLY): 10 min  Assessment / Plan / Recommendation Clinical Impression  Pt was seen for skilled ST targeting cognitive-linguistic goals.  Upon arrival, pt was partially reclined in bed, asleep, but awakened for brief periods with voice and light touch.  Pt was repositioned to sitting as upright as possible in bed to further maximize alertness.  SLP completed skilled observations with presentations of pt's currently prescribed diet.  Initially, pt presented with anterior labial loss of nectar thick liquids.  However, with skilled interventions for rate and portion control (total assist faded to mod verbal and tactile cues) pt was able to better contain boluses.  Pt also consumed dys 2 textures with max multimodal cues to clear residual solids from the oral cavity post swallows.  No overt s/s of aspiration were evident with solids and liquids; however, given pt's decreased alertness advanced trials were not attempted at this time.  Pt's daughter was present during today's therapy session and was provided with skilled education regarding rationale behind pt's currently prescribed diet and swallowing precautions.  Particular emphasis was placed on limiting distractions during meals and only allowing pt to eat when she is awake and alert.  Pt's daughter reported that she had been feeding pt during meals; therefore, SLP encouraged pt's daughter to allow pt to feed herself to facilitate neuro recovery and maximize functional independence for self feeding.  Pt is scheduled to be transferred to SNF today where it is recommended that she receive follow up ST services to address dysphagia and cognition.     HPI Other Pertinent Information: Pt is an 81 y/o female with PMH of CAD, HTN, HLD, TIA, and Type 2 DM. She presented to  the ED with slurred speech, increased weakness, gait instability, and headache. She also had a fall. Imaging revealed acute nonhemorrhagic infarct of the R basal ganglia.   Pertinent Vitals Pain Assessment: No/denies pain  SLP Plan  Continue with current plan of care    Recommendations Diet recommendations: Dysphagia 2 (fine chop);Nectar-thick liquid Liquids provided via: Cup;Straw Medication Administration: Whole meds with puree Supervision: Patient able to self feed;Full supervision/cueing for compensatory strategies;Trained caregiver to feed patient Compensations: Slow rate;Small sips/bites;Check for pocketing Postural Changes and/or Swallow Maneuvers: Seated upright 90 degrees             Plan: Continue with current plan of care    GO     Darlin Stenseth, Selinda Orion 08/24/2015, 11:43 AM

## 2015-08-24 NOTE — Care Management Important Message (Signed)
Important Message  Patient Details  Name: [name] MRN: [id number] Date of Birth: July 26, 1925   Medicare Important Message Given:  Yes-second notification given    Nathen May 08/24/2015, 10:59 AM

## 2015-08-24 NOTE — Clinical Social Work Note (Signed)
Patient to be discharged to [facility] today. BSW intern has contacted patient's daughter, Maudry Diego by phone to update her regarding the patient's  discharge plan. BSW intern will contact [facility] to arrange transportation for patient upon patient family request. BSW intern to contact bedside nurse about patient's discharge plan. BSW  Intern is signing off, if any further medical needs arise please consult.  Corgan Mormile-BSW Intern [phone]

## 2015-08-24 NOTE — Clinical Social Work Placement (Signed)
   CLINICAL SOCIAL WORK PLACEMENT  NOTE  Date:  08/24/2015  Patient Details  Name: [name] MRN: [id number] Date of Birth: [birth year]  Clinical Social Work is seeking post-discharge placement for this patient at the [place] level of care (*CSW will initial, date and re-position this form in  chart as items are completed):  Yes   Patient/family provided with [place] Work Department's list of facilities offering this level of care within the geographic area requested by the patient (or if unable, by the patient's family).  Yes   Patient/family informed of their freedom to choose among providers that offer the needed level of care, that participate in Medicare, Medicaid or managed care program needed by the patient, have an available bed and are willing to accept the patient.  Yes   Patient/family informed of [facility]'s ownership interest in [facility] and [facility], as well as of the fact that they are under no obligation to receive care at these facilities.  PASRR submitted to EDS on 08/22/15     PASRR number received on 08/22/15     Existing PASRR number confirmed on       FL2 transmitted to all facilities in geographic area requested by pt/family on 08/22/15     FL2 transmitted to all facilities within larger geographic area on       Patient informed that his/her managed care company has contracts with or will negotiate with certain facilities, including the following:        Yes   Patient/family informed of bed offers received.  Patient chooses bed at  [facility])     Physician recommends and patient chooses bed at  [facility] )    Patient to be transferred to  [facility]) on 08/24/15.  Patient to be transferred to facility by  [facility])     Patient family notified on 08/24/15 of transfer.  Name of family member notified:   Maudry Diego)     PHYSICIAN Please sign FL2      Additional Comment:    _______________________________________________ Leane Call, Student-SW 08/24/2015, 10:15 AM

## 2015-08-24 NOTE — Progress Notes (Signed)
Report called to RN at [employer].  Aldona Bar, RN

## 2015-08-25 DIAGNOSIS — Z9181 History of falling: Secondary | ICD-10-CM | POA: Diagnosis not present

## 2015-08-25 DIAGNOSIS — G8194 Hemiplegia, unspecified affecting left nondominant side: Secondary | ICD-10-CM | POA: Diagnosis not present

## 2015-08-25 DIAGNOSIS — I6330 Cerebral infarction due to thrombosis of unspecified cerebral artery: Secondary | ICD-10-CM | POA: Diagnosis not present

## 2015-08-28 DIAGNOSIS — F419 Anxiety disorder, unspecified: Secondary | ICD-10-CM | POA: Diagnosis not present

## 2015-08-28 DIAGNOSIS — M545 Low back pain: Secondary | ICD-10-CM | POA: Diagnosis not present

## 2015-08-28 DIAGNOSIS — R52 Pain, unspecified: Secondary | ICD-10-CM | POA: Diagnosis not present

## 2015-09-04 ENCOUNTER — Other Ambulatory Visit: Payer: Self-pay | Admitting: Endocrinology

## 2015-09-04 DIAGNOSIS — I6329 Cerebral infarction due to unspecified occlusion or stenosis of other precerebral arteries: Secondary | ICD-10-CM | POA: Diagnosis not present

## 2015-09-04 DIAGNOSIS — I10 Essential (primary) hypertension: Secondary | ICD-10-CM | POA: Diagnosis not present

## 2015-09-04 DIAGNOSIS — I69291 Dysphagia following other nontraumatic intracranial hemorrhage: Secondary | ICD-10-CM | POA: Diagnosis not present

## 2015-09-04 DIAGNOSIS — F419 Anxiety disorder, unspecified: Secondary | ICD-10-CM | POA: Diagnosis not present

## 2015-09-04 DIAGNOSIS — K219 Gastro-esophageal reflux disease without esophagitis: Secondary | ICD-10-CM | POA: Diagnosis not present

## 2015-09-04 DIAGNOSIS — F0390 Unspecified dementia without behavioral disturbance: Secondary | ICD-10-CM | POA: Diagnosis not present

## 2015-09-04 DIAGNOSIS — E119 Type 2 diabetes mellitus without complications: Secondary | ICD-10-CM | POA: Diagnosis not present

## 2015-09-04 DIAGNOSIS — E785 Hyperlipidemia, unspecified: Secondary | ICD-10-CM | POA: Diagnosis not present

## 2015-09-04 DIAGNOSIS — I480 Paroxysmal atrial fibrillation: Secondary | ICD-10-CM | POA: Diagnosis not present

## 2015-09-04 DIAGNOSIS — I2510 Atherosclerotic heart disease of native coronary artery without angina pectoris: Secondary | ICD-10-CM | POA: Diagnosis not present

## 2015-09-06 DIAGNOSIS — M6281 Muscle weakness (generalized): Secondary | ICD-10-CM | POA: Diagnosis not present

## 2015-09-06 DIAGNOSIS — E119 Type 2 diabetes mellitus without complications: Secondary | ICD-10-CM | POA: Diagnosis not present

## 2015-09-06 DIAGNOSIS — I69391 Dysphagia following cerebral infarction: Secondary | ICD-10-CM | POA: Diagnosis not present

## 2015-09-06 DIAGNOSIS — F0390 Unspecified dementia without behavioral disturbance: Secondary | ICD-10-CM | POA: Diagnosis not present

## 2015-09-06 DIAGNOSIS — I69354 Hemiplegia and hemiparesis following cerebral infarction affecting left non-dominant side: Secondary | ICD-10-CM | POA: Diagnosis not present

## 2015-09-06 DIAGNOSIS — R1310 Dysphagia, unspecified: Secondary | ICD-10-CM | POA: Diagnosis not present

## 2015-09-11 DIAGNOSIS — R569 Unspecified convulsions: Secondary | ICD-10-CM | POA: Insufficient documentation

## 2015-09-11 DIAGNOSIS — F0390 Unspecified dementia without behavioral disturbance: Secondary | ICD-10-CM | POA: Diagnosis not present

## 2015-09-11 DIAGNOSIS — R1310 Dysphagia, unspecified: Secondary | ICD-10-CM | POA: Diagnosis not present

## 2015-09-11 DIAGNOSIS — I69391 Dysphagia following cerebral infarction: Secondary | ICD-10-CM | POA: Diagnosis not present

## 2015-09-11 DIAGNOSIS — M6281 Muscle weakness (generalized): Secondary | ICD-10-CM | POA: Diagnosis not present

## 2015-09-11 DIAGNOSIS — E119 Type 2 diabetes mellitus without complications: Secondary | ICD-10-CM | POA: Diagnosis not present

## 2015-09-11 DIAGNOSIS — I69354 Hemiplegia and hemiparesis following cerebral infarction affecting left non-dominant side: Secondary | ICD-10-CM | POA: Diagnosis not present

## 2015-09-11 NOTE — Procedures (Signed)
ELECTROENCEPHALOGRAM REPORT  Dates of Recording: 08/16/2015 to 08/17/2015  Patient's Name: [name] MRN: [id number] Date of Birth: 1925-02-23  Referring Provider: Dr. Ellouise Newer  Procedure: 24-hour ambulatory EEG  History: This is an 81 year old woman with recurrent episodes of loss of consciousness over the past 5-6 years, recently admitted for transient aphasia, and during her hospital stay had an episode of generalized stiffening and unresponsiveness concerning for seizure. EEG for classification.  Medications: Toprol, Glucophage, Aricept, aspirin  Technical Summary: This is a 24-hour multichannel digital EEG recording measured by the international 10-20 system with electrodes applied with paste and impedances below 5000 ohms performed as portable with EKG monitoring.  The digital EEG was referentially recorded, reformatted, and digitally filtered in a variety of bipolar and referential montages for optimal display.    DESCRIPTION OF RECORDING: During maximal wakefulness, the background activity consisted of a symmetric 9.5 Hz posterior dominant rhythm which was reactive to eye opening. There is occasional focal 3-4 Hz slowing seen over the left temporal region. There were no epileptiform discharges seen in wakefulness.  During the recording, the patient progresses through wakefulness, drowsiness, and Stage 2 sleep.  Similar occasional focal delta slowing was seen over the left temporal region. Again, there were no epileptiform discharges seen.  Events: There were no push button events. Patient became upset at 3am and started pulling out frontal and EKG leads.  There were no electrographic seizures seen.  EKG lead was unremarkable.  IMPRESSION: This 24-hour ambulatory EEG study is abnormal due to occasional focal slowing over the left temporal region.  CLINICAL CORRELATION of the above findings indicates focal cerebral dysfunction over the left temporal region suggestive of  underlying structural or physiologic abnormality. The absence of epileptiform discharges does not exclude a clinical diagnosis of epilepsy. Typical events were not captured.  If further clinical questions remain, inpatient video EEG monitoring may be helpful.   Ellouise Newer, M.D.

## 2015-09-12 ENCOUNTER — Telehealth: Payer: Self-pay | Admitting: Endocrinology

## 2015-09-12 ENCOUNTER — Telehealth: Payer: Self-pay | Admitting: Neurology

## 2015-09-12 DIAGNOSIS — I69354 Hemiplegia and hemiparesis following cerebral infarction affecting left non-dominant side: Secondary | ICD-10-CM | POA: Diagnosis not present

## 2015-09-12 DIAGNOSIS — E119 Type 2 diabetes mellitus without complications: Secondary | ICD-10-CM | POA: Diagnosis not present

## 2015-09-12 DIAGNOSIS — F0390 Unspecified dementia without behavioral disturbance: Secondary | ICD-10-CM | POA: Diagnosis not present

## 2015-09-12 DIAGNOSIS — I69391 Dysphagia following cerebral infarction: Secondary | ICD-10-CM | POA: Diagnosis not present

## 2015-09-12 DIAGNOSIS — R1310 Dysphagia, unspecified: Secondary | ICD-10-CM | POA: Diagnosis not present

## 2015-09-12 DIAGNOSIS — M6281 Muscle weakness (generalized): Secondary | ICD-10-CM | POA: Diagnosis not present

## 2015-09-12 NOTE — Telephone Encounter (Signed)
[name] from [place] would like a verbal order regarding [name]    Thank you

## 2015-09-12 NOTE — Telephone Encounter (Signed)
Spoke with patient's daughter/Shirley notified there were no seizure discharges seen.

## 2015-09-12 NOTE — Telephone Encounter (Signed)
We need to sit down together to do this. Please move up next ov to this week--hospital recommended this anyway.

## 2015-09-12 NOTE — Telephone Encounter (Signed)
I contacted Physical Therapist Wes with [name]. He is requesting a verbal physical therapy orders for twice a week for 8 weeks. Also the pt needs a written Rx for a raised toilet seat with arm rests, a rolling walker and a ankle foot orthotic.    Please advise, Thanks!

## 2015-09-12 NOTE — Telephone Encounter (Signed)
PT's daughter Lynn Ito called and wanted to know if the results from her EEG are ready/Dawn CB# [phone] or [phone]

## 2015-09-13 DIAGNOSIS — F0390 Unspecified dementia without behavioral disturbance: Secondary | ICD-10-CM | POA: Diagnosis not present

## 2015-09-13 DIAGNOSIS — E119 Type 2 diabetes mellitus without complications: Secondary | ICD-10-CM | POA: Diagnosis not present

## 2015-09-13 DIAGNOSIS — I69391 Dysphagia following cerebral infarction: Secondary | ICD-10-CM | POA: Diagnosis not present

## 2015-09-13 DIAGNOSIS — R1310 Dysphagia, unspecified: Secondary | ICD-10-CM | POA: Diagnosis not present

## 2015-09-13 DIAGNOSIS — I69354 Hemiplegia and hemiparesis following cerebral infarction affecting left non-dominant side: Secondary | ICD-10-CM | POA: Diagnosis not present

## 2015-09-13 DIAGNOSIS — M6281 Muscle weakness (generalized): Secondary | ICD-10-CM | POA: Diagnosis not present

## 2015-09-13 NOTE — Telephone Encounter (Signed)
Patient scheduled for 09/14/2015 for office visit.

## 2015-09-14 ENCOUNTER — Ambulatory Visit (INDEPENDENT_AMBULATORY_CARE_PROVIDER_SITE_OTHER): Payer: Medicare Other | Admitting: Endocrinology

## 2015-09-14 ENCOUNTER — Encounter: Payer: Self-pay | Admitting: Endocrinology

## 2015-09-14 VITALS — BP 123/64 | HR 72 | Temp 97.5°F | Ht 62.0 in | Wt 109.0 lb

## 2015-09-14 DIAGNOSIS — I639 Cerebral infarction, unspecified: Secondary | ICD-10-CM | POA: Diagnosis not present

## 2015-09-14 DIAGNOSIS — D518 Other vitamin B12 deficiency anemias: Secondary | ICD-10-CM | POA: Diagnosis not present

## 2015-09-14 MED ORDER — CYANOCOBALAMIN 1000 MCG/ML IJ SOLN
1000.0000 ug | Freq: Once | INTRAMUSCULAR | Status: AC
  Administered 2015-09-14: 1000 ug via INTRAMUSCULAR

## 2015-09-14 MED ORDER — LORAZEPAM 0.5 MG PO TABS: 0.5000 mg | ORAL_TABLET | Freq: Three times a day (TID) | ORAL | Status: DC | PRN

## 2015-09-14 NOTE — Patient Instructions (Addendum)
Please see Dr Delice Lesch soon.  Please continue the same metoprolol.  Here is a prescription, for the lorazepam.   it is critically important to prevent falling down (keep floor areas well-lit, dry, and free of loose objects.  If you have a cane, walker, or wheelchair, you should use it, even for short trips around the house.  Also, try not to rush). Please come back for a follow-up appointment in 1 month.

## 2015-09-14 NOTE — Progress Notes (Signed)
Subjective:    Patient ID: [name], female    DOB: Jul 14, 1925, 81 y.o.   MRN: [id number]  HPI  The state of at least three ongoing medical problems is addressed today, with interval history of each noted here: Pt was recently in the hospital with CVA: she says she feels much better.  Main symptom continues to be LUE weakness.   PAF: she denies sob.   Anxiety: ativan helps Past Medical History  Diagnosis Date  . Arrhythmia     paroxysmal atrial fibrillation  . Coronary artery disease   . Hypertension   . Hyperlipidemia   . Edema   . GERD (gastroesophageal reflux disease)   . Anemia B twelve deficiency   . Dizziness - giddy   . Abdominal pain   . Iron deficiency anemia, unspecified   . Pernicious anemia   . Thoracic or lumbosacral neuritis or radiculitis, unspecified   . Symptomatic menopausal or female climacteric states   . Lumbago   . Benign neoplasm of colon   . Allergic rhinitis, cause unspecified   . Type II diabetes mellitus ([place])   . Degeneration of lumbar or lumbosacral intervertebral disc   . Arthritis     "arms and hands" (06/30/2013)    Past Surgical History  Procedure Laterality Date  . Back surgery    . Cholecystectomy    . Appendectomy  1974  . Abdominal hysterectomy  1974  . Carpal tunnel release Bilateral 1970's  . Cataract extraction, bilateral Bilateral   . Cardiac catheterization    . Coronary angioplasty with stent placement      "1" (06/30/2013  . Lumbar disc surgery      Social History   Social History  . Marital Status: Widowed    Spouse Name: N/A  . Number of Children: 8  . Years of Education: 10th Grade   Occupational History  . Not on file.   Social History Main Topics  . Smoking status: Never Smoker   . Smokeless tobacco: Current User    Types: Snuff  . Alcohol Use: No  . Drug Use: No  . Sexual Activity: No   Other Topics Concern  . Not on file   Social History Narrative   Patient lives with daughter in a two  story home.   Patient has a 10th grade education.   Patient is retired.   Patient is right handed.    Current Outpatient Prescriptions on File Prior to Visit  Medication Sig Dispense Refill  . apixaban (ELIQUIS) 2.5 MG TABS tablet Take 1 tablet (2.5 mg total) by mouth 2 (two) times daily.    [name] atorvastatin (LIPITOR) 40 MG tablet Take 1 tablet (40 mg total) by mouth daily at 6 PM.    . calcium carbonate (TUMS - DOSED IN MG ELEMENTAL CALCIUM) 500 MG chewable tablet Chew 1 tablet by mouth as needed for heartburn.    . diclofenac sodium (VOLTAREN) 1 % GEL Apply 4 g topically 4 (four) times daily. As needed for pain 100 g 5  . donepezil (ARICEPT) 5 MG tablet Take 1 tablet (5 mg total) by mouth at bedtime. 30 tablet 11  . FERREX 150 150 MG capsule TAKE 1 CAPSULE (150 MG TOTAL) BY MOUTH 2 (TWO) TIMES DAILY. (Patient taking differently: TAKE 1 CAPSULE (150 MG TOTAL) BY MOUTH DAILY.) 60 capsule 1  . metFORMIN (GLUCOPHAGE-XR) 500 MG 24 hr tablet TAKE ONE TABLET BY MOUTH TWICE DAILY 60 tablet 0  . metoprolol succinate (TOPROL-XL) 50  MG 24 hr tablet TAKE 1 TABLET (50 MG TOTAL) BY MOUTH DAILY. TAKE WITH OR IMMEDIATELY FOLLOWING A MEAL. 30 tablet 8  . nitroGLYCERIN (NITROSTAT) 0.4 MG SL tablet Place 0.4 mg under the tongue every 5 (five) minutes as needed.      [name] omeprazole (PRILOSEC) 40 MG capsule Take 40 mg by mouth daily as needed (heartburn).   11  . triamcinolone cream (KENALOG) 0.1 % Apply 1 application topically 4 (four) times daily. As needed for rash 80 g 3   No current facility-administered medications on file prior to visit.    Allergies  Allergen Reactions  . Penicillins Swelling    REACTION: swelling of joints  . Sulfonamide Derivatives Other (See Comments)    REACTION: "like my head was full of water"  . Albumin (Human) Other (See Comments)    Doesn't remember   . Clindamycin Other (See Comments)    Doesn't remember   . Iodides Hives  . Iodinated Diagnostic Agents Hives  . Sulfa  Antibiotics Hives  . Celecoxib Itching and Rash  . Erythromycin Itching and Rash  . Nitrofuran Derivatives Rash  . Nitrofurantoin Itching and Rash  . Piroxicam Other (See Comments)    unknown  . Povidone-Iodine Itching and Rash    Family History  Problem Relation Age of Onset  . Breast cancer Sister     "behind heart"  . Colon cancer Sister   . Colon cancer Sister     BP 123/64 mmHg  Pulse 72  Temp(Src) 97.5 F (36.4 C) (Oral)  Ht 5\' 2"  (1.575 m)  Wt 109 lb (49.442 kg)  BMI 19.93 kg/m2  SpO2 99%  Review of Systems She has LLE weakness, but this is improving also.  She has depression also (ativan helps)    Objective:   Physical Exam Vital signs: see vs page Gen: elderly, frail, no distress.  In wheelchair.   PSYCH: Alert and well-oriented (except she say 10/01/15).  Does not appear anxious nor depressed.    Lab Results  Component Value Date   HGBA1C 8.0* 08/21/2015   Lab Results  Component Value Date   CREATININE 0.95 08/21/2015   BUN 21* 08/21/2015   NA 136 08/21/2015   K 4.3 08/21/2015   CL 104 08/21/2015   CO2 18* 08/21/2015      Assessment & Plan:  CVA: clinically much better. PAF: clinically well-controlled Anxiety: although the ativan carries some risk, i am refilling to optimize QOL.    Patient is advised the following: Patient Instructions  Please see Dr Delice Lesch soon.  Please continue the same metoprolol.  Here is a prescription, for the lorazepam.   it is critically important to prevent falling down (keep floor areas well-lit, dry, and free of loose objects.  If you have a cane, walker, or wheelchair, you should use it, even for short trips around the house.  Also, try not to rush). Please come back for a follow-up appointment in 1 month.

## 2015-09-15 DIAGNOSIS — M6281 Muscle weakness (generalized): Secondary | ICD-10-CM | POA: Diagnosis not present

## 2015-09-15 DIAGNOSIS — E119 Type 2 diabetes mellitus without complications: Secondary | ICD-10-CM | POA: Diagnosis not present

## 2015-09-15 DIAGNOSIS — I69354 Hemiplegia and hemiparesis following cerebral infarction affecting left non-dominant side: Secondary | ICD-10-CM | POA: Diagnosis not present

## 2015-09-15 DIAGNOSIS — F0390 Unspecified dementia without behavioral disturbance: Secondary | ICD-10-CM | POA: Diagnosis not present

## 2015-09-15 DIAGNOSIS — R1310 Dysphagia, unspecified: Secondary | ICD-10-CM | POA: Diagnosis not present

## 2015-09-15 DIAGNOSIS — I69391 Dysphagia following cerebral infarction: Secondary | ICD-10-CM | POA: Diagnosis not present

## 2015-09-15 NOTE — Telephone Encounter (Signed)
[place] is calling  from [place] needing a verbal or for P/T for 8 weeks. [phone]

## 2015-09-15 NOTE — Telephone Encounter (Signed)
Orders faxed to [facility]. 

## 2015-09-15 NOTE — Telephone Encounter (Signed)
ok 

## 2015-09-15 NOTE — Telephone Encounter (Signed)
See note below and please advise, Thanks! 

## 2015-09-15 NOTE — Telephone Encounter (Signed)
I contacted [place] and gave verbal order for the PT. He also needs a written order for a raised toilet seat with arm rests, a rolling walker and a ankle foot orthotic. Can we provide the orders for these items?

## 2015-09-15 NOTE — Telephone Encounter (Signed)
i printed 

## 2015-09-18 ENCOUNTER — Ambulatory Visit: Payer: Medicare Other | Admitting: Neurology

## 2015-09-18 ENCOUNTER — Telehealth: Payer: Self-pay | Admitting: Endocrinology

## 2015-09-18 DIAGNOSIS — R1310 Dysphagia, unspecified: Secondary | ICD-10-CM | POA: Diagnosis not present

## 2015-09-18 DIAGNOSIS — I69354 Hemiplegia and hemiparesis following cerebral infarction affecting left non-dominant side: Secondary | ICD-10-CM | POA: Diagnosis not present

## 2015-09-18 DIAGNOSIS — F0390 Unspecified dementia without behavioral disturbance: Secondary | ICD-10-CM | POA: Diagnosis not present

## 2015-09-18 DIAGNOSIS — E119 Type 2 diabetes mellitus without complications: Secondary | ICD-10-CM | POA: Diagnosis not present

## 2015-09-18 DIAGNOSIS — I69391 Dysphagia following cerebral infarction: Secondary | ICD-10-CM | POA: Diagnosis not present

## 2015-09-18 DIAGNOSIS — M6281 Muscle weakness (generalized): Secondary | ICD-10-CM | POA: Diagnosis not present

## 2015-09-18 NOTE — Telephone Encounter (Signed)
[name] [phone]

## 2015-09-18 NOTE — Telephone Encounter (Signed)
[name] from [name] need verbal order for occupational Therapy 2 x a week for 7 week, please advise

## 2015-09-18 NOTE — Telephone Encounter (Signed)
Do we have a contact number for Marlowe Kays with [facility]?

## 2015-09-19 DIAGNOSIS — E119 Type 2 diabetes mellitus without complications: Secondary | ICD-10-CM | POA: Diagnosis not present

## 2015-09-19 DIAGNOSIS — F0390 Unspecified dementia without behavioral disturbance: Secondary | ICD-10-CM | POA: Diagnosis not present

## 2015-09-19 DIAGNOSIS — R1310 Dysphagia, unspecified: Secondary | ICD-10-CM | POA: Diagnosis not present

## 2015-09-19 DIAGNOSIS — I69354 Hemiplegia and hemiparesis following cerebral infarction affecting left non-dominant side: Secondary | ICD-10-CM | POA: Diagnosis not present

## 2015-09-19 DIAGNOSIS — I69391 Dysphagia following cerebral infarction: Secondary | ICD-10-CM | POA: Diagnosis not present

## 2015-09-19 DIAGNOSIS — M6281 Muscle weakness (generalized): Secondary | ICD-10-CM | POA: Diagnosis not present

## 2015-09-20 DIAGNOSIS — I69354 Hemiplegia and hemiparesis following cerebral infarction affecting left non-dominant side: Secondary | ICD-10-CM | POA: Diagnosis not present

## 2015-09-20 DIAGNOSIS — I69391 Dysphagia following cerebral infarction: Secondary | ICD-10-CM | POA: Diagnosis not present

## 2015-09-20 DIAGNOSIS — R1310 Dysphagia, unspecified: Secondary | ICD-10-CM | POA: Diagnosis not present

## 2015-09-20 DIAGNOSIS — M6281 Muscle weakness (generalized): Secondary | ICD-10-CM | POA: Diagnosis not present

## 2015-09-20 DIAGNOSIS — E119 Type 2 diabetes mellitus without complications: Secondary | ICD-10-CM | POA: Diagnosis not present

## 2015-09-20 DIAGNOSIS — F0390 Unspecified dementia without behavioral disturbance: Secondary | ICD-10-CM | POA: Diagnosis not present

## 2015-09-21 DIAGNOSIS — I69354 Hemiplegia and hemiparesis following cerebral infarction affecting left non-dominant side: Secondary | ICD-10-CM | POA: Diagnosis not present

## 2015-09-21 DIAGNOSIS — I69391 Dysphagia following cerebral infarction: Secondary | ICD-10-CM | POA: Diagnosis not present

## 2015-09-21 DIAGNOSIS — M6281 Muscle weakness (generalized): Secondary | ICD-10-CM | POA: Diagnosis not present

## 2015-09-21 DIAGNOSIS — R1310 Dysphagia, unspecified: Secondary | ICD-10-CM | POA: Diagnosis not present

## 2015-09-21 DIAGNOSIS — F0390 Unspecified dementia without behavioral disturbance: Secondary | ICD-10-CM | POA: Diagnosis not present

## 2015-09-21 DIAGNOSIS — E119 Type 2 diabetes mellitus without complications: Secondary | ICD-10-CM | POA: Diagnosis not present

## 2015-09-25 DIAGNOSIS — R1310 Dysphagia, unspecified: Secondary | ICD-10-CM | POA: Diagnosis not present

## 2015-09-25 DIAGNOSIS — M6281 Muscle weakness (generalized): Secondary | ICD-10-CM | POA: Diagnosis not present

## 2015-09-25 DIAGNOSIS — F0390 Unspecified dementia without behavioral disturbance: Secondary | ICD-10-CM | POA: Diagnosis not present

## 2015-09-25 DIAGNOSIS — I69354 Hemiplegia and hemiparesis following cerebral infarction affecting left non-dominant side: Secondary | ICD-10-CM | POA: Diagnosis not present

## 2015-09-25 DIAGNOSIS — I69391 Dysphagia following cerebral infarction: Secondary | ICD-10-CM | POA: Diagnosis not present

## 2015-09-25 DIAGNOSIS — E119 Type 2 diabetes mellitus without complications: Secondary | ICD-10-CM | POA: Diagnosis not present

## 2015-09-26 DIAGNOSIS — M6281 Muscle weakness (generalized): Secondary | ICD-10-CM | POA: Diagnosis not present

## 2015-09-26 DIAGNOSIS — R1310 Dysphagia, unspecified: Secondary | ICD-10-CM | POA: Diagnosis not present

## 2015-09-26 DIAGNOSIS — I69354 Hemiplegia and hemiparesis following cerebral infarction affecting left non-dominant side: Secondary | ICD-10-CM | POA: Diagnosis not present

## 2015-09-26 DIAGNOSIS — I69391 Dysphagia following cerebral infarction: Secondary | ICD-10-CM | POA: Diagnosis not present

## 2015-09-26 DIAGNOSIS — F0390 Unspecified dementia without behavioral disturbance: Secondary | ICD-10-CM | POA: Diagnosis not present

## 2015-09-26 DIAGNOSIS — E119 Type 2 diabetes mellitus without complications: Secondary | ICD-10-CM | POA: Diagnosis not present

## 2015-09-27 DIAGNOSIS — E119 Type 2 diabetes mellitus without complications: Secondary | ICD-10-CM | POA: Diagnosis not present

## 2015-09-27 DIAGNOSIS — I69354 Hemiplegia and hemiparesis following cerebral infarction affecting left non-dominant side: Secondary | ICD-10-CM | POA: Diagnosis not present

## 2015-09-27 DIAGNOSIS — R1310 Dysphagia, unspecified: Secondary | ICD-10-CM | POA: Diagnosis not present

## 2015-09-27 DIAGNOSIS — M6281 Muscle weakness (generalized): Secondary | ICD-10-CM | POA: Diagnosis not present

## 2015-09-27 DIAGNOSIS — I69391 Dysphagia following cerebral infarction: Secondary | ICD-10-CM | POA: Diagnosis not present

## 2015-09-27 DIAGNOSIS — F0390 Unspecified dementia without behavioral disturbance: Secondary | ICD-10-CM | POA: Diagnosis not present

## 2015-09-28 DIAGNOSIS — I69391 Dysphagia following cerebral infarction: Secondary | ICD-10-CM | POA: Diagnosis not present

## 2015-09-28 DIAGNOSIS — E119 Type 2 diabetes mellitus without complications: Secondary | ICD-10-CM | POA: Diagnosis not present

## 2015-09-28 DIAGNOSIS — M6281 Muscle weakness (generalized): Secondary | ICD-10-CM | POA: Diagnosis not present

## 2015-09-28 DIAGNOSIS — I69354 Hemiplegia and hemiparesis following cerebral infarction affecting left non-dominant side: Secondary | ICD-10-CM | POA: Diagnosis not present

## 2015-09-28 DIAGNOSIS — R1310 Dysphagia, unspecified: Secondary | ICD-10-CM | POA: Diagnosis not present

## 2015-09-28 DIAGNOSIS — F0390 Unspecified dementia without behavioral disturbance: Secondary | ICD-10-CM | POA: Diagnosis not present

## 2015-10-02 ENCOUNTER — Telehealth: Payer: Self-pay | Admitting: Neurology

## 2015-10-02 ENCOUNTER — Ambulatory Visit: Payer: Medicare Other | Admitting: Endocrinology

## 2015-10-02 ENCOUNTER — Telehealth: Payer: Self-pay | Admitting: Cardiovascular Disease

## 2015-10-02 ENCOUNTER — Telehealth: Payer: Self-pay | Admitting: Endocrinology

## 2015-10-02 DIAGNOSIS — M6281 Muscle weakness (generalized): Secondary | ICD-10-CM | POA: Diagnosis not present

## 2015-10-02 DIAGNOSIS — E119 Type 2 diabetes mellitus without complications: Secondary | ICD-10-CM | POA: Diagnosis not present

## 2015-10-02 DIAGNOSIS — F0390 Unspecified dementia without behavioral disturbance: Secondary | ICD-10-CM | POA: Diagnosis not present

## 2015-10-02 DIAGNOSIS — I69391 Dysphagia following cerebral infarction: Secondary | ICD-10-CM | POA: Diagnosis not present

## 2015-10-02 DIAGNOSIS — I69354 Hemiplegia and hemiparesis following cerebral infarction affecting left non-dominant side: Secondary | ICD-10-CM | POA: Diagnosis not present

## 2015-10-02 DIAGNOSIS — R1310 Dysphagia, unspecified: Secondary | ICD-10-CM | POA: Diagnosis not present

## 2015-10-02 MED ORDER — APIXABAN 2.5 MG PO TABS: 2.5000 mg | ORAL_TABLET | Freq: Two times a day (BID) | ORAL | Status: DC

## 2015-10-02 NOTE — Telephone Encounter (Signed)
Patient daughter called requesting a refill   Rx: Eliquis   Pharmacy: [facility] [place]   Thank you

## 2015-10-02 NOTE — Telephone Encounter (Signed)
Please send request to neurol, as this is outside the scope of my practice

## 2015-10-02 NOTE — Telephone Encounter (Signed)
°*  STAT* If patient is at the pharmacy, call can be transferred to refill team.   1. Which medications need to be refilled? (please list name of each medication and dose if known) Eliquis  2. Which pharmacy/location (including street and city if local pharmacy) is medication to be sent to? [facility] [address] in [place]  3. Do they need a 30 day or 90 day supply? 90 day   Pt's daughter states the pt is down to 2 pills and they would like a call back when this has been done.

## 2015-10-02 NOTE — Telephone Encounter (Addendum)
Noted. Pt's daughter advised.

## 2015-10-02 NOTE — Telephone Encounter (Signed)
Pt's Rx was sent to pt's pharmacy as requested. Confirmation received.  °

## 2015-10-02 NOTE — Telephone Encounter (Signed)
Returned call. Patient's daughter wanted a refill on Eliquis. Patient was started on med when she was in the hospital last month. After speaking with Dr. Delice Lesch, explained to her after Dr. Delice Lesch looked at her hospital records that even though med is a blood thinner it looks like she was out on med for her a-fib and per Dr. Amparo Bristol advisement she needs to get rx from her cardiologist. She verbalized good understanding and will call patient's heart doctor.

## 2015-10-02 NOTE — Telephone Encounter (Signed)
See note below and please advise if ok to refill. Medication is listed under a historical provider. Thanks!

## 2015-10-02 NOTE — Telephone Encounter (Signed)
Pt daughter shirley needs to speak to someone about a medication that was given to the patient at the hospital eliquis she states that she needs a refill please call her at [phone]

## 2015-10-03 ENCOUNTER — Telehealth: Payer: Self-pay | Admitting: Cardiovascular Disease

## 2015-10-03 ENCOUNTER — Other Ambulatory Visit: Payer: Self-pay | Admitting: Endocrinology

## 2015-10-03 DIAGNOSIS — I69354 Hemiplegia and hemiparesis following cerebral infarction affecting left non-dominant side: Secondary | ICD-10-CM | POA: Diagnosis not present

## 2015-10-03 DIAGNOSIS — E119 Type 2 diabetes mellitus without complications: Secondary | ICD-10-CM | POA: Diagnosis not present

## 2015-10-03 DIAGNOSIS — R1310 Dysphagia, unspecified: Secondary | ICD-10-CM | POA: Diagnosis not present

## 2015-10-03 DIAGNOSIS — F0390 Unspecified dementia without behavioral disturbance: Secondary | ICD-10-CM | POA: Diagnosis not present

## 2015-10-03 DIAGNOSIS — I69391 Dysphagia following cerebral infarction: Secondary | ICD-10-CM | POA: Diagnosis not present

## 2015-10-03 DIAGNOSIS — M6281 Muscle weakness (generalized): Secondary | ICD-10-CM | POA: Diagnosis not present

## 2015-10-03 MED ORDER — ATORVASTATIN CALCIUM 40 MG PO TABS: 40.0000 mg | ORAL_TABLET | Freq: Every day | ORAL | Status: DC

## 2015-10-03 MED ORDER — APIXABAN 2.5 MG PO TABS: 2.5000 mg | ORAL_TABLET | Freq: Two times a day (BID) | ORAL | Status: DC

## 2015-10-03 NOTE — Telephone Encounter (Signed)
New message    *STAT* If patient is at the pharmacy, call can be transferred to refill team.   1. Which medications need to be refilled? (please list name of each medication and dose if known) atorvastatin  40 mg or  20 mg  / please clarify dosage /    2. Which pharmacy/location (including street and city if local pharmacy) is medication to be sent to? [facility] [address]   3. Do they need a 30 day or 90 day supply? 90 days supply

## 2015-10-03 NOTE — Telephone Encounter (Signed)
Pt's Rx was sent to pt's pharmacy. Confirmation received.

## 2015-10-04 DIAGNOSIS — I69354 Hemiplegia and hemiparesis following cerebral infarction affecting left non-dominant side: Secondary | ICD-10-CM | POA: Diagnosis not present

## 2015-10-04 DIAGNOSIS — I69391 Dysphagia following cerebral infarction: Secondary | ICD-10-CM | POA: Diagnosis not present

## 2015-10-04 DIAGNOSIS — E119 Type 2 diabetes mellitus without complications: Secondary | ICD-10-CM | POA: Diagnosis not present

## 2015-10-04 DIAGNOSIS — M6281 Muscle weakness (generalized): Secondary | ICD-10-CM | POA: Diagnosis not present

## 2015-10-04 DIAGNOSIS — F0390 Unspecified dementia without behavioral disturbance: Secondary | ICD-10-CM | POA: Diagnosis not present

## 2015-10-04 DIAGNOSIS — R1310 Dysphagia, unspecified: Secondary | ICD-10-CM | POA: Diagnosis not present

## 2015-10-09 DIAGNOSIS — I69354 Hemiplegia and hemiparesis following cerebral infarction affecting left non-dominant side: Secondary | ICD-10-CM | POA: Diagnosis not present

## 2015-10-09 DIAGNOSIS — F0390 Unspecified dementia without behavioral disturbance: Secondary | ICD-10-CM | POA: Diagnosis not present

## 2015-10-09 DIAGNOSIS — E119 Type 2 diabetes mellitus without complications: Secondary | ICD-10-CM | POA: Diagnosis not present

## 2015-10-09 DIAGNOSIS — R1310 Dysphagia, unspecified: Secondary | ICD-10-CM | POA: Diagnosis not present

## 2015-10-09 DIAGNOSIS — M6281 Muscle weakness (generalized): Secondary | ICD-10-CM | POA: Diagnosis not present

## 2015-10-09 DIAGNOSIS — I69391 Dysphagia following cerebral infarction: Secondary | ICD-10-CM | POA: Diagnosis not present

## 2015-10-09 NOTE — Telephone Encounter (Signed)
Please recheck in a few days, and let us know.

## 2015-10-09 NOTE — Telephone Encounter (Signed)
Patient b/p is elevated this morning  180/90, think b/p meds should be adjusted, please advise

## 2015-10-09 NOTE — Telephone Encounter (Signed)
I contacted the pt's daughter and advised of note below. She voiced understanding.  

## 2015-10-09 NOTE — Telephone Encounter (Signed)
See note below and please advise, Thanks! 

## 2015-10-10 DIAGNOSIS — I69391 Dysphagia following cerebral infarction: Secondary | ICD-10-CM | POA: Diagnosis not present

## 2015-10-10 DIAGNOSIS — M6281 Muscle weakness (generalized): Secondary | ICD-10-CM | POA: Diagnosis not present

## 2015-10-10 DIAGNOSIS — I69354 Hemiplegia and hemiparesis following cerebral infarction affecting left non-dominant side: Secondary | ICD-10-CM | POA: Diagnosis not present

## 2015-10-10 DIAGNOSIS — R1310 Dysphagia, unspecified: Secondary | ICD-10-CM | POA: Diagnosis not present

## 2015-10-10 DIAGNOSIS — E119 Type 2 diabetes mellitus without complications: Secondary | ICD-10-CM | POA: Diagnosis not present

## 2015-10-10 DIAGNOSIS — F0390 Unspecified dementia without behavioral disturbance: Secondary | ICD-10-CM | POA: Diagnosis not present

## 2015-10-11 DIAGNOSIS — I69354 Hemiplegia and hemiparesis following cerebral infarction affecting left non-dominant side: Secondary | ICD-10-CM | POA: Diagnosis not present

## 2015-10-11 DIAGNOSIS — E119 Type 2 diabetes mellitus without complications: Secondary | ICD-10-CM | POA: Diagnosis not present

## 2015-10-11 DIAGNOSIS — M6281 Muscle weakness (generalized): Secondary | ICD-10-CM | POA: Diagnosis not present

## 2015-10-11 DIAGNOSIS — R1310 Dysphagia, unspecified: Secondary | ICD-10-CM | POA: Diagnosis not present

## 2015-10-11 DIAGNOSIS — F0390 Unspecified dementia without behavioral disturbance: Secondary | ICD-10-CM | POA: Diagnosis not present

## 2015-10-11 DIAGNOSIS — I69391 Dysphagia following cerebral infarction: Secondary | ICD-10-CM | POA: Diagnosis not present

## 2015-10-12 DIAGNOSIS — E119 Type 2 diabetes mellitus without complications: Secondary | ICD-10-CM | POA: Diagnosis not present

## 2015-10-12 DIAGNOSIS — I69354 Hemiplegia and hemiparesis following cerebral infarction affecting left non-dominant side: Secondary | ICD-10-CM | POA: Diagnosis not present

## 2015-10-12 DIAGNOSIS — M6281 Muscle weakness (generalized): Secondary | ICD-10-CM | POA: Diagnosis not present

## 2015-10-12 DIAGNOSIS — R1310 Dysphagia, unspecified: Secondary | ICD-10-CM | POA: Diagnosis not present

## 2015-10-12 DIAGNOSIS — I69391 Dysphagia following cerebral infarction: Secondary | ICD-10-CM | POA: Diagnosis not present

## 2015-10-12 DIAGNOSIS — F0390 Unspecified dementia without behavioral disturbance: Secondary | ICD-10-CM | POA: Diagnosis not present

## 2015-10-13 ENCOUNTER — Ambulatory Visit (INDEPENDENT_AMBULATORY_CARE_PROVIDER_SITE_OTHER): Payer: Medicare Other | Admitting: Endocrinology

## 2015-10-13 ENCOUNTER — Encounter: Payer: Self-pay | Admitting: Endocrinology

## 2015-10-13 VITALS — BP 126/82 | HR 78 | Temp 97.8°F | Ht 62.0 in | Wt 109.0 lb

## 2015-10-13 DIAGNOSIS — N181 Chronic kidney disease, stage 1: Secondary | ICD-10-CM | POA: Diagnosis not present

## 2015-10-13 DIAGNOSIS — E1122 Type 2 diabetes mellitus with diabetic chronic kidney disease: Secondary | ICD-10-CM

## 2015-10-13 DIAGNOSIS — I639 Cerebral infarction, unspecified: Secondary | ICD-10-CM

## 2015-10-13 NOTE — Progress Notes (Signed)
Subjective:    Patient ID: [name], female    DOB: [birth year], 81 y.o.   MRN: [id number]  HPI I asked dtr, who says pt has insomnia.  She takes ativan an average of < 1 per day.   Pt also returns for f/u of diabetes mellitus: DM type: 2 Dx'ed: [id number] Complications: CAD Therapy: 2 oral meds GDM: never DKA: never Severe hypoglycemia: never Pancreatitis: never Other: she has never taken insulin; she did not tolerate nateglinide or [place].   Interval history: dtr wants for her to check cbg's.   Past Medical History  Diagnosis Date  . Arrhythmia     paroxysmal atrial fibrillation  . Coronary artery disease   . Hypertension   . Hyperlipidemia   . Edema   . GERD (gastroesophageal reflux disease)   . Anemia B twelve deficiency   . Dizziness - giddy   . Abdominal pain   . Iron deficiency anemia, unspecified   . Pernicious anemia   . Thoracic or lumbosacral neuritis or radiculitis, unspecified   . Symptomatic menopausal or female climacteric states   . Lumbago   . Benign neoplasm of colon   . Allergic rhinitis, cause unspecified   . Type II diabetes mellitus ([place])   . Degeneration of lumbar or lumbosacral intervertebral disc   . Arthritis     "arms and hands" (06/30/2013)    Past Surgical History  Procedure Laterality Date  . Back surgery    . Cholecystectomy    . Appendectomy  1974  . Abdominal hysterectomy  1974  . Carpal tunnel release Bilateral 1970's  . Cataract extraction, bilateral Bilateral   . Cardiac catheterization    . Coronary angioplasty with stent placement      "1" (06/30/2013  . Lumbar disc surgery      Social History   Social History  . Marital Status: Widowed    Spouse Name: N/A  . Number of Children: 8  . Years of Education: 10th Grade   Occupational History  . Not on file.   Social History Main Topics  . Smoking status: Never Smoker   . Smokeless tobacco: Current User    Types: Snuff  . Alcohol Use: No  . Drug Use: No  .  Sexual Activity: No   Other Topics Concern  . Not on file   Social History Narrative   Patient lives with daughter in a two story home.   Patient has a 10th grade education.   Patient is retired.   Patient is right handed.    Current Outpatient Prescriptions on File Prior to Visit  Medication Sig Dispense Refill  . apixaban (ELIQUIS) 2.5 MG TABS tablet Take 1 tablet (2.5 mg total) by mouth 2 (two) times daily. 180 tablet 1  . atorvastatin (LIPITOR) 40 MG tablet Take 1 tablet (40 mg total) by mouth daily at 6 PM. 90 tablet 1  . calcium carbonate (TUMS - DOSED IN MG ELEMENTAL CALCIUM) 500 MG chewable tablet Chew 1 tablet by mouth as needed for heartburn.    . Cyanocobalamin 1000 MCG/ML LIQD Take 1,000 mg by mouth every 30 (thirty) days.    . diclofenac sodium (VOLTAREN) 1 % GEL Apply 4 g topically 4 (four) times daily. As needed for pain 100 g 5  . donepezil (ARICEPT) 5 MG tablet Take 1 tablet (5 mg total) by mouth at bedtime. 30 tablet 11  . FERREX 150 150 MG capsule TAKE 1 CAPSULE (150 MG TOTAL) BY MOUTH 2 (TWO) TIMES  DAILY. (Patient taking differently: TAKE 1 CAPSULE (150 MG TOTAL) BY MOUTH DAILY.) 60 capsule 1  . LORazepam (ATIVAN) 0.5 MG tablet Take 1 tablet (0.5 mg total) by mouth every 8 (eight) hours as needed for anxiety. 30 tablet 2  . metFORMIN (GLUCOPHAGE-XR) 500 MG 24 hr tablet TAKE ONE TABLET BY MOUTH TWICE DAILY 60 tablet 0  . metoprolol succinate (TOPROL-XL) 50 MG 24 hr tablet TAKE 1 TABLET (50 MG TOTAL) BY MOUTH DAILY. TAKE WITH OR IMMEDIATELY FOLLOWING A MEAL. 30 tablet 8  . nitroGLYCERIN (NITROSTAT) 0.4 MG SL tablet Place 0.4 mg under the tongue every 5 (five) minutes as needed.      [name] omeprazole (PRILOSEC) 40 MG capsule Take 40 mg by mouth daily as needed (heartburn).   11  . triamcinolone cream (KENALOG) 0.1 % Apply 1 application topically 4 (four) times daily. As needed for rash 80 g 3   No current facility-administered medications on file prior to visit.     Allergies  Allergen Reactions  . Penicillins Swelling    REACTION: swelling of joints  . Sulfonamide Derivatives Other (See Comments)    REACTION: "like my head was full of water"  . Albumin (Human) Other (See Comments)    Doesn't remember   . Clindamycin Other (See Comments)    Doesn't remember   . Iodides Hives  . Iodinated Diagnostic Agents Hives  . Sulfa Antibiotics Hives  . Celecoxib Itching and Rash  . Erythromycin Itching and Rash  . Nitrofuran Derivatives Rash  . Nitrofurantoin Itching and Rash  . Piroxicam Other (See Comments)    unknown  . Povidone-Iodine Itching and Rash    Family History  Problem Relation Age of Onset  . Breast cancer Sister     "behind heart"  . Colon cancer Sister   . Colon cancer Sister     BP 126/82 mmHg  Pulse 78  Temp(Src) 97.8 F (36.6 C) (Oral)  Ht 5\' 2"  (1.575 m)  Wt 109 lb (49.442 kg)  BMI 19.93 kg/m2  SpO2 97%  Review of Systems Denies falls.      Objective:   Physical Exam Vital signs: see vs page Gen: elderly, frail, no distress.  In wheelchair. Alert and oriented, except she says it is 10/31/15.       Assessment & Plan:  HTN: well-controlled.  Please continue the same medication. Insomnia: it is rx'ed in the interest of QOL, in view of advanced age and mult med probs.   DM: I am ok with her checking cbg's.    Patient is advised the following: Patient Instructions  Try taking the lorazepam at bedtime.  If just 1/2 a pill works, that is better. check your blood sugar once a day.  vary the time of day when you check, between before the 3 meals, and at bedtime.  also check if you have symptoms of your blood sugar being too high or too low.  please keep a record of the readings and bring it to your next appointment here (or you can bring the meter itself).  You can write it on any piece of paper.  please call us sooner if your blood sugar goes below 70, or if you have a lot of readings over 200.  Here are some  phone numbers to call, to get a meter and strips.  Please come back for a follow-up appointment in 6 months.       Insomnia Insomnia is a sleep disorder that makes it difficult  to fall asleep or to stay asleep. Insomnia can cause tiredness (fatigue), low energy, difficulty concentrating, mood swings, and poor performance at work or school.  There are three different ways to classify insomnia:  Difficulty falling asleep.  Difficulty staying asleep.  Waking up too early in the morning. Any type of insomnia can be long-term (chronic) or short-term (acute). Both are common. Short-term insomnia usually lasts for three months or less. Chronic insomnia occurs at least three times a week for longer than three months. CAUSES  Insomnia may be caused by another condition, situation, or substance, such as:  Anxiety.  Certain medicines.  Gastroesophageal reflux disease (GERD) or other gastrointestinal conditions.  Asthma or other breathing conditions.  Restless legs syndrome, sleep apnea, or other sleep disorders.  Chronic pain.  Menopause. This may include hot flashes.  Stroke.  Abuse of alcohol, tobacco, or illegal drugs.  Depression.  Caffeine.   Neurological disorders, such as Alzheimer disease.  An overactive thyroid (hyperthyroidism). The cause of insomnia may not be known. RISK FACTORS Risk factors for insomnia include:  Gender. Women are more commonly affected than men.  Age. Insomnia is more common as you get older.  Stress. This may involve your professional or personal life.  Income. Insomnia is more common in people with lower income.  Lack of exercise.   Irregular work schedule or night shifts.  Traveling between different time zones. SIGNS AND SYMPTOMS If you have insomnia, trouble falling asleep or trouble staying asleep is the main symptom. This may lead to other symptoms, such as:  Feeling fatigued.  Feeling nervous about going to sleep.  Not  feeling rested in the morning.  Having trouble concentrating.  Feeling irritable, anxious, or depressed. TREATMENT  Treatment for insomnia depends on the cause. If your insomnia is caused by an underlying condition, treatment will focus on addressing the condition. Treatment may also include:   Medicines to help you sleep.  Counseling or therapy.  Lifestyle adjustments. HOME CARE INSTRUCTIONS   Take medicines only as directed by your health care provider.  Keep regular sleeping and waking hours. Avoid naps.  Keep a sleep diary to help you and your health care provider figure out what could be causing your insomnia. Include:   When you sleep.  When you wake up during the night.  How well you sleep.   How rested you feel the next day.  Any side effects of medicines you are taking.  What you eat and drink.   Make your bedroom a comfortable place where it is easy to fall asleep:  Put up shades or special blackout curtains to block light from outside.  Use a white noise machine to block noise.  Keep the temperature cool.   Exercise regularly as directed by your health care provider. Avoid exercising right before bedtime.  Use relaxation techniques to manage stress. Ask your health care provider to suggest some techniques that may work well for you. These may include:  Breathing exercises.  Routines to release muscle tension.  Visualizing peaceful scenes.  Cut back on alcohol, caffeinated beverages, and cigarettes, especially close to bedtime. These can disrupt your sleep.  Do not overeat or eat spicy foods right before bedtime. This can lead to digestive discomfort that can make it hard for you to sleep.  Limit screen use before bedtime. This includes:  Watching TV.  Using your smartphone, tablet, and computer.  Stick to a routine. This can help you fall asleep faster. Try to do a  quiet activity, brush your teeth, and go to bed at the same time each  night.  Get out of bed if you are still awake after 15 minutes of trying to sleep. Keep the lights down, but try reading or doing a quiet activity. When you feel sleepy, go back to bed.  Make sure that you drive carefully. Avoid driving if you feel very sleepy.  Keep all follow-up appointments as directed by your health care provider. This is important. SEEK MEDICAL CARE IF:   You are tired throughout the day or have trouble in your daily routine due to sleepiness.  You continue to have sleep problems or your sleep problems get worse. SEEK IMMEDIATE MEDICAL CARE IF:   You have serious thoughts about hurting yourself or someone else.   This information is not intended to replace advice given to you by your health care provider. Make sure you discuss any questions you have with your health care provider.   Document Released: 10/25/2000 Document Revised: 07/19/2015 Document Reviewed: 07/29/2014 Elsevier Interactive Patient Education [employer].

## 2015-10-13 NOTE — Patient Instructions (Addendum)
Try taking the lorazepam at bedtime.  If just 1/2 a pill works, that is better. check your blood sugar once a day.  vary the time of day when you check, between before the 3 meals, and at bedtime.  also check if you have symptoms of your blood sugar being too high or too low.  please keep a record of the readings and bring it to your next appointment here (or you can bring the meter itself).  You can write it on any piece of paper.  please call us sooner if your blood sugar goes below 70, or if you have a lot of readings over 200.  Here are some phone numbers to call, to get a meter and strips.  Please come back for a follow-up appointment in 6 months.       Insomnia Insomnia is a sleep disorder that makes it difficult to fall asleep or to stay asleep. Insomnia can cause tiredness (fatigue), low energy, difficulty concentrating, mood swings, and poor performance at work or school.  There are three different ways to classify insomnia:  Difficulty falling asleep.  Difficulty staying asleep.  Waking up too early in the morning. Any type of insomnia can be long-term (chronic) or short-term (acute). Both are common. Short-term insomnia usually lasts for three months or less. Chronic insomnia occurs at least three times a week for longer than three months. CAUSES  Insomnia may be caused by another condition, situation, or substance, such as:  Anxiety.  Certain medicines.  Gastroesophageal reflux disease (GERD) or other gastrointestinal conditions.  Asthma or other breathing conditions.  Restless legs syndrome, sleep apnea, or other sleep disorders.  Chronic pain.  Menopause. This may include hot flashes.  Stroke.  Abuse of alcohol, tobacco, or illegal drugs.  Depression.  Caffeine.   Neurological disorders, such as Alzheimer disease.  An overactive thyroid (hyperthyroidism). The cause of insomnia may not be known. RISK FACTORS Risk factors for insomnia include:  Gender.  Women are more commonly affected than men.  Age. Insomnia is more common as you get older.  Stress. This may involve your professional or personal life.  Income. Insomnia is more common in people with lower income.  Lack of exercise.   Irregular work schedule or night shifts.  Traveling between different time zones. SIGNS AND SYMPTOMS If you have insomnia, trouble falling asleep or trouble staying asleep is the main symptom. This may lead to other symptoms, such as:  Feeling fatigued.  Feeling nervous about going to sleep.  Not feeling rested in the morning.  Having trouble concentrating.  Feeling irritable, anxious, or depressed. TREATMENT  Treatment for insomnia depends on the cause. If your insomnia is caused by an underlying condition, treatment will focus on addressing the condition. Treatment may also include:   Medicines to help you sleep.  Counseling or therapy.  Lifestyle adjustments. HOME CARE INSTRUCTIONS   Take medicines only as directed by your health care provider.  Keep regular sleeping and waking hours. Avoid naps.  Keep a sleep diary to help you and your health care provider figure out what could be causing your insomnia. Include:   When you sleep.  When you wake up during the night.  How well you sleep.   How rested you feel the next day.  Any side effects of medicines you are taking.  What you eat and drink.   Make your bedroom a comfortable place where it is easy to fall asleep:  Put up shades or special  blackout curtains to block light from outside.  Use a white noise machine to block noise.  Keep the temperature cool.   Exercise regularly as directed by your health care provider. Avoid exercising right before bedtime.  Use relaxation techniques to manage stress. Ask your health care provider to suggest some techniques that may work well for you. These may include:  Breathing exercises.  Routines to release muscle  tension.  Visualizing peaceful scenes.  Cut back on alcohol, caffeinated beverages, and cigarettes, especially close to bedtime. These can disrupt your sleep.  Do not overeat or eat spicy foods right before bedtime. This can lead to digestive discomfort that can make it hard for you to sleep.  Limit screen use before bedtime. This includes:  Watching TV.  Using your smartphone, tablet, and computer.  Stick to a routine. This can help you fall asleep faster. Try to do a quiet activity, brush your teeth, and go to bed at the same time each night.  Get out of bed if you are still awake after 15 minutes of trying to sleep. Keep the lights down, but try reading or doing a quiet activity. When you feel sleepy, go back to bed.  Make sure that you drive carefully. Avoid driving if you feel very sleepy.  Keep all follow-up appointments as directed by your health care provider. This is important. SEEK MEDICAL CARE IF:   You are tired throughout the day or have trouble in your daily routine due to sleepiness.  You continue to have sleep problems or your sleep problems get worse. SEEK IMMEDIATE MEDICAL CARE IF:   You have serious thoughts about hurting yourself or someone else.   This information is not intended to replace advice given to you by your health care provider. Make sure you discuss any questions you have with your health care provider.   Document Released: 10/25/2000 Document Revised: 07/19/2015 Document Reviewed: 07/29/2014 Elsevier Interactive Patient Education [employer].

## 2015-10-16 DIAGNOSIS — E119 Type 2 diabetes mellitus without complications: Secondary | ICD-10-CM | POA: Diagnosis not present

## 2015-10-16 DIAGNOSIS — I69391 Dysphagia following cerebral infarction: Secondary | ICD-10-CM | POA: Diagnosis not present

## 2015-10-16 DIAGNOSIS — F0390 Unspecified dementia without behavioral disturbance: Secondary | ICD-10-CM | POA: Diagnosis not present

## 2015-10-16 DIAGNOSIS — M6281 Muscle weakness (generalized): Secondary | ICD-10-CM | POA: Diagnosis not present

## 2015-10-16 DIAGNOSIS — R1310 Dysphagia, unspecified: Secondary | ICD-10-CM | POA: Diagnosis not present

## 2015-10-16 DIAGNOSIS — I69354 Hemiplegia and hemiparesis following cerebral infarction affecting left non-dominant side: Secondary | ICD-10-CM | POA: Diagnosis not present

## 2015-10-17 DIAGNOSIS — R1310 Dysphagia, unspecified: Secondary | ICD-10-CM | POA: Diagnosis not present

## 2015-10-17 DIAGNOSIS — F0390 Unspecified dementia without behavioral disturbance: Secondary | ICD-10-CM | POA: Diagnosis not present

## 2015-10-17 DIAGNOSIS — I69391 Dysphagia following cerebral infarction: Secondary | ICD-10-CM | POA: Diagnosis not present

## 2015-10-17 DIAGNOSIS — M6281 Muscle weakness (generalized): Secondary | ICD-10-CM | POA: Diagnosis not present

## 2015-10-17 DIAGNOSIS — E119 Type 2 diabetes mellitus without complications: Secondary | ICD-10-CM | POA: Diagnosis not present

## 2015-10-17 DIAGNOSIS — I69354 Hemiplegia and hemiparesis following cerebral infarction affecting left non-dominant side: Secondary | ICD-10-CM | POA: Diagnosis not present

## 2015-10-18 DIAGNOSIS — M6281 Muscle weakness (generalized): Secondary | ICD-10-CM | POA: Diagnosis not present

## 2015-10-18 DIAGNOSIS — E119 Type 2 diabetes mellitus without complications: Secondary | ICD-10-CM | POA: Diagnosis not present

## 2015-10-18 DIAGNOSIS — I69391 Dysphagia following cerebral infarction: Secondary | ICD-10-CM | POA: Diagnosis not present

## 2015-10-18 DIAGNOSIS — F0390 Unspecified dementia without behavioral disturbance: Secondary | ICD-10-CM | POA: Diagnosis not present

## 2015-10-18 DIAGNOSIS — R1310 Dysphagia, unspecified: Secondary | ICD-10-CM | POA: Diagnosis not present

## 2015-10-18 DIAGNOSIS — I69354 Hemiplegia and hemiparesis following cerebral infarction affecting left non-dominant side: Secondary | ICD-10-CM | POA: Diagnosis not present

## 2015-10-19 DIAGNOSIS — F0390 Unspecified dementia without behavioral disturbance: Secondary | ICD-10-CM | POA: Diagnosis not present

## 2015-10-19 DIAGNOSIS — I69391 Dysphagia following cerebral infarction: Secondary | ICD-10-CM | POA: Diagnosis not present

## 2015-10-19 DIAGNOSIS — M6281 Muscle weakness (generalized): Secondary | ICD-10-CM | POA: Diagnosis not present

## 2015-10-19 DIAGNOSIS — I69354 Hemiplegia and hemiparesis following cerebral infarction affecting left non-dominant side: Secondary | ICD-10-CM | POA: Diagnosis not present

## 2015-10-19 DIAGNOSIS — R1310 Dysphagia, unspecified: Secondary | ICD-10-CM | POA: Diagnosis not present

## 2015-10-19 DIAGNOSIS — E119 Type 2 diabetes mellitus without complications: Secondary | ICD-10-CM | POA: Diagnosis not present

## 2015-10-23 DIAGNOSIS — R1310 Dysphagia, unspecified: Secondary | ICD-10-CM | POA: Diagnosis not present

## 2015-10-23 DIAGNOSIS — E119 Type 2 diabetes mellitus without complications: Secondary | ICD-10-CM | POA: Diagnosis not present

## 2015-10-23 DIAGNOSIS — I69354 Hemiplegia and hemiparesis following cerebral infarction affecting left non-dominant side: Secondary | ICD-10-CM | POA: Diagnosis not present

## 2015-10-23 DIAGNOSIS — I69391 Dysphagia following cerebral infarction: Secondary | ICD-10-CM | POA: Diagnosis not present

## 2015-10-23 DIAGNOSIS — F0390 Unspecified dementia without behavioral disturbance: Secondary | ICD-10-CM | POA: Diagnosis not present

## 2015-10-23 DIAGNOSIS — M6281 Muscle weakness (generalized): Secondary | ICD-10-CM | POA: Diagnosis not present

## 2015-10-24 DIAGNOSIS — F0390 Unspecified dementia without behavioral disturbance: Secondary | ICD-10-CM | POA: Diagnosis not present

## 2015-10-24 DIAGNOSIS — I69391 Dysphagia following cerebral infarction: Secondary | ICD-10-CM | POA: Diagnosis not present

## 2015-10-24 DIAGNOSIS — I69354 Hemiplegia and hemiparesis following cerebral infarction affecting left non-dominant side: Secondary | ICD-10-CM | POA: Diagnosis not present

## 2015-10-24 DIAGNOSIS — M6281 Muscle weakness (generalized): Secondary | ICD-10-CM | POA: Diagnosis not present

## 2015-10-24 DIAGNOSIS — E119 Type 2 diabetes mellitus without complications: Secondary | ICD-10-CM | POA: Diagnosis not present

## 2015-10-24 DIAGNOSIS — R1310 Dysphagia, unspecified: Secondary | ICD-10-CM | POA: Diagnosis not present

## 2015-10-25 DIAGNOSIS — E119 Type 2 diabetes mellitus without complications: Secondary | ICD-10-CM | POA: Diagnosis not present

## 2015-10-25 DIAGNOSIS — I69354 Hemiplegia and hemiparesis following cerebral infarction affecting left non-dominant side: Secondary | ICD-10-CM | POA: Diagnosis not present

## 2015-10-25 DIAGNOSIS — R1310 Dysphagia, unspecified: Secondary | ICD-10-CM | POA: Diagnosis not present

## 2015-10-25 DIAGNOSIS — F0390 Unspecified dementia without behavioral disturbance: Secondary | ICD-10-CM | POA: Diagnosis not present

## 2015-10-25 DIAGNOSIS — M6281 Muscle weakness (generalized): Secondary | ICD-10-CM | POA: Diagnosis not present

## 2015-10-25 DIAGNOSIS — I69391 Dysphagia following cerebral infarction: Secondary | ICD-10-CM | POA: Diagnosis not present

## 2015-10-26 DIAGNOSIS — R1310 Dysphagia, unspecified: Secondary | ICD-10-CM | POA: Diagnosis not present

## 2015-10-26 DIAGNOSIS — F0390 Unspecified dementia without behavioral disturbance: Secondary | ICD-10-CM | POA: Diagnosis not present

## 2015-10-26 DIAGNOSIS — E119 Type 2 diabetes mellitus without complications: Secondary | ICD-10-CM | POA: Diagnosis not present

## 2015-10-26 DIAGNOSIS — M6281 Muscle weakness (generalized): Secondary | ICD-10-CM | POA: Diagnosis not present

## 2015-10-26 DIAGNOSIS — I69354 Hemiplegia and hemiparesis following cerebral infarction affecting left non-dominant side: Secondary | ICD-10-CM | POA: Diagnosis not present

## 2015-10-26 DIAGNOSIS — I69391 Dysphagia following cerebral infarction: Secondary | ICD-10-CM | POA: Diagnosis not present

## 2015-10-27 ENCOUNTER — Other Ambulatory Visit: Payer: Self-pay | Admitting: Endocrinology

## 2015-10-30 ENCOUNTER — Other Ambulatory Visit: Payer: Self-pay | Admitting: *Deleted

## 2015-10-30 ENCOUNTER — Other Ambulatory Visit: Payer: Self-pay

## 2015-10-30 DIAGNOSIS — F0390 Unspecified dementia without behavioral disturbance: Secondary | ICD-10-CM | POA: Diagnosis not present

## 2015-10-30 DIAGNOSIS — I69391 Dysphagia following cerebral infarction: Secondary | ICD-10-CM | POA: Diagnosis not present

## 2015-10-30 DIAGNOSIS — R1310 Dysphagia, unspecified: Secondary | ICD-10-CM | POA: Diagnosis not present

## 2015-10-30 DIAGNOSIS — M6281 Muscle weakness (generalized): Secondary | ICD-10-CM | POA: Diagnosis not present

## 2015-10-30 DIAGNOSIS — E119 Type 2 diabetes mellitus without complications: Secondary | ICD-10-CM | POA: Diagnosis not present

## 2015-10-30 DIAGNOSIS — I69354 Hemiplegia and hemiparesis following cerebral infarction affecting left non-dominant side: Secondary | ICD-10-CM | POA: Diagnosis not present

## 2015-10-30 MED ORDER — METOPROLOL SUCCINATE ER 50 MG PO TB24: ORAL_TABLET | ORAL | Status: DC

## 2015-10-30 MED ORDER — DONEPEZIL HCL 5 MG PO TABS: 5.0000 mg | ORAL_TABLET | Freq: Every day | ORAL | Status: DC

## 2015-10-30 NOTE — Telephone Encounter (Signed)
Please advise if ok to refill. Medication is listed under a historical provider.  Thanks!  

## 2015-11-01 DIAGNOSIS — I69354 Hemiplegia and hemiparesis following cerebral infarction affecting left non-dominant side: Secondary | ICD-10-CM | POA: Diagnosis not present

## 2015-11-01 DIAGNOSIS — M6281 Muscle weakness (generalized): Secondary | ICD-10-CM | POA: Diagnosis not present

## 2015-11-01 DIAGNOSIS — E119 Type 2 diabetes mellitus without complications: Secondary | ICD-10-CM | POA: Diagnosis not present

## 2015-11-01 DIAGNOSIS — F0390 Unspecified dementia without behavioral disturbance: Secondary | ICD-10-CM | POA: Diagnosis not present

## 2015-11-01 DIAGNOSIS — R1310 Dysphagia, unspecified: Secondary | ICD-10-CM | POA: Diagnosis not present

## 2015-11-01 DIAGNOSIS — I69391 Dysphagia following cerebral infarction: Secondary | ICD-10-CM | POA: Diagnosis not present

## 2015-11-02 DIAGNOSIS — E119 Type 2 diabetes mellitus without complications: Secondary | ICD-10-CM | POA: Diagnosis not present

## 2015-11-02 DIAGNOSIS — I69391 Dysphagia following cerebral infarction: Secondary | ICD-10-CM | POA: Diagnosis not present

## 2015-11-02 DIAGNOSIS — F0390 Unspecified dementia without behavioral disturbance: Secondary | ICD-10-CM | POA: Diagnosis not present

## 2015-11-02 DIAGNOSIS — R1310 Dysphagia, unspecified: Secondary | ICD-10-CM | POA: Diagnosis not present

## 2015-11-02 DIAGNOSIS — M6281 Muscle weakness (generalized): Secondary | ICD-10-CM | POA: Diagnosis not present

## 2015-11-02 DIAGNOSIS — I69354 Hemiplegia and hemiparesis following cerebral infarction affecting left non-dominant side: Secondary | ICD-10-CM | POA: Diagnosis not present

## 2015-11-05 DIAGNOSIS — I480 Paroxysmal atrial fibrillation: Secondary | ICD-10-CM | POA: Diagnosis not present

## 2015-11-05 DIAGNOSIS — E119 Type 2 diabetes mellitus without complications: Secondary | ICD-10-CM | POA: Diagnosis not present

## 2015-11-05 DIAGNOSIS — I69354 Hemiplegia and hemiparesis following cerebral infarction affecting left non-dominant side: Secondary | ICD-10-CM | POA: Diagnosis not present

## 2015-11-05 DIAGNOSIS — F0390 Unspecified dementia without behavioral disturbance: Secondary | ICD-10-CM | POA: Diagnosis not present

## 2015-11-05 DIAGNOSIS — M1990 Unspecified osteoarthritis, unspecified site: Secondary | ICD-10-CM | POA: Diagnosis not present

## 2015-11-05 DIAGNOSIS — I10 Essential (primary) hypertension: Secondary | ICD-10-CM | POA: Diagnosis not present

## 2015-11-07 ENCOUNTER — Telehealth: Payer: Self-pay | Admitting: Endocrinology

## 2015-11-07 NOTE — Telephone Encounter (Signed)
[facility] requesting orders for pt to recertify and extend twice a week for 8 weeks. # [phone] [place]

## 2015-11-07 NOTE — Telephone Encounter (Signed)
Please advise 

## 2015-11-07 NOTE — Telephone Encounter (Signed)
ok 

## 2015-11-08 DIAGNOSIS — M1990 Unspecified osteoarthritis, unspecified site: Secondary | ICD-10-CM | POA: Diagnosis not present

## 2015-11-08 DIAGNOSIS — E119 Type 2 diabetes mellitus without complications: Secondary | ICD-10-CM | POA: Diagnosis not present

## 2015-11-08 DIAGNOSIS — I69354 Hemiplegia and hemiparesis following cerebral infarction affecting left non-dominant side: Secondary | ICD-10-CM | POA: Diagnosis not present

## 2015-11-08 DIAGNOSIS — I10 Essential (primary) hypertension: Secondary | ICD-10-CM | POA: Diagnosis not present

## 2015-11-08 DIAGNOSIS — I480 Paroxysmal atrial fibrillation: Secondary | ICD-10-CM | POA: Diagnosis not present

## 2015-11-08 DIAGNOSIS — F0390 Unspecified dementia without behavioral disturbance: Secondary | ICD-10-CM | POA: Diagnosis not present

## 2015-11-10 DIAGNOSIS — I10 Essential (primary) hypertension: Secondary | ICD-10-CM | POA: Diagnosis not present

## 2015-11-10 DIAGNOSIS — I480 Paroxysmal atrial fibrillation: Secondary | ICD-10-CM | POA: Diagnosis not present

## 2015-11-10 DIAGNOSIS — F0390 Unspecified dementia without behavioral disturbance: Secondary | ICD-10-CM | POA: Diagnosis not present

## 2015-11-10 DIAGNOSIS — I69354 Hemiplegia and hemiparesis following cerebral infarction affecting left non-dominant side: Secondary | ICD-10-CM | POA: Diagnosis not present

## 2015-11-10 DIAGNOSIS — E119 Type 2 diabetes mellitus without complications: Secondary | ICD-10-CM | POA: Diagnosis not present

## 2015-11-10 DIAGNOSIS — M1990 Unspecified osteoarthritis, unspecified site: Secondary | ICD-10-CM | POA: Diagnosis not present

## 2015-11-10 NOTE — Telephone Encounter (Signed)
Called [place] with [facility] and advised him ok per Dr Loanne Drilling to recertify for 8 mores weeks.

## 2015-11-14 ENCOUNTER — Telehealth: Payer: Self-pay | Admitting: Endocrinology

## 2015-11-14 NOTE — Telephone Encounter (Signed)
See both notes below and please advise, Thanks!

## 2015-11-14 NOTE — Telephone Encounter (Signed)
Please verify no fever or sob If not, tylenol, claritin-D, and robitussin-DM are good.  OT is OK.

## 2015-11-14 NOTE — Telephone Encounter (Signed)
See note below and please advise, Thanks! 

## 2015-11-14 NOTE — Telephone Encounter (Signed)
Pt has sinus cold symptoms - daughter is ask what she can give her?

## 2015-11-14 NOTE — Telephone Encounter (Signed)
Verbal order needed for [name] # [phone]  Request to to extend OT for 2 times a week for 6 weeks.

## 2015-11-14 NOTE — Telephone Encounter (Signed)
I contacted the pt;s daughter and Occupational therapist and advised of notes below.

## 2015-11-15 ENCOUNTER — Telehealth: Payer: Self-pay | Admitting: Endocrinology

## 2015-11-15 DIAGNOSIS — M1990 Unspecified osteoarthritis, unspecified site: Secondary | ICD-10-CM | POA: Diagnosis not present

## 2015-11-15 DIAGNOSIS — F0390 Unspecified dementia without behavioral disturbance: Secondary | ICD-10-CM | POA: Diagnosis not present

## 2015-11-15 DIAGNOSIS — E119 Type 2 diabetes mellitus without complications: Secondary | ICD-10-CM | POA: Diagnosis not present

## 2015-11-15 DIAGNOSIS — I69354 Hemiplegia and hemiparesis following cerebral infarction affecting left non-dominant side: Secondary | ICD-10-CM | POA: Diagnosis not present

## 2015-11-15 DIAGNOSIS — I480 Paroxysmal atrial fibrillation: Secondary | ICD-10-CM | POA: Diagnosis not present

## 2015-11-15 DIAGNOSIS — I10 Essential (primary) hypertension: Secondary | ICD-10-CM | POA: Diagnosis not present

## 2015-11-15 NOTE — Telephone Encounter (Signed)
Please continue the same medication.   

## 2015-11-15 NOTE — Telephone Encounter (Signed)
Melissa with [facility] advised of note below. She voiced understanding and stated she would call if the pt had any other abnormal readings.

## 2015-11-15 NOTE — Telephone Encounter (Signed)
Melissa from [facility] called stated patient has a elevated B/S 167/88 phone # [phone]

## 2015-11-15 NOTE — Telephone Encounter (Signed)
See note below regarding the pt's blood pressure not blood sugar.

## 2015-11-17 ENCOUNTER — Telehealth: Payer: Self-pay | Admitting: Endocrinology

## 2015-11-17 DIAGNOSIS — F0390 Unspecified dementia without behavioral disturbance: Secondary | ICD-10-CM | POA: Diagnosis not present

## 2015-11-17 DIAGNOSIS — I480 Paroxysmal atrial fibrillation: Secondary | ICD-10-CM | POA: Diagnosis not present

## 2015-11-17 DIAGNOSIS — M1990 Unspecified osteoarthritis, unspecified site: Secondary | ICD-10-CM | POA: Diagnosis not present

## 2015-11-17 DIAGNOSIS — I10 Essential (primary) hypertension: Secondary | ICD-10-CM | POA: Diagnosis not present

## 2015-11-17 DIAGNOSIS — I69354 Hemiplegia and hemiparesis following cerebral infarction affecting left non-dominant side: Secondary | ICD-10-CM | POA: Diagnosis not present

## 2015-11-17 DIAGNOSIS — E119 Type 2 diabetes mellitus without complications: Secondary | ICD-10-CM | POA: Diagnosis not present

## 2015-11-17 NOTE — Telephone Encounter (Signed)
Ov next week

## 2015-11-17 NOTE — Telephone Encounter (Signed)
[name] from [facility] call stated that patient has a elevated b/p 176/97 but feel fine. [phone]

## 2015-11-17 NOTE — Telephone Encounter (Signed)
See note below to be advised. 

## 2015-11-17 NOTE — Telephone Encounter (Signed)
I contacted the pt's daughter and advised of note below. Pt scheduled for 11/21/2015. Appointment on 11/27/2015 has been cancelled.

## 2015-11-21 ENCOUNTER — Ambulatory Visit (INDEPENDENT_AMBULATORY_CARE_PROVIDER_SITE_OTHER): Payer: Medicare Other | Admitting: Endocrinology

## 2015-11-21 ENCOUNTER — Encounter: Payer: Self-pay | Admitting: Endocrinology

## 2015-11-21 VITALS — BP 134/76 | HR 77 | Temp 98.0°F | Ht 62.0 in | Wt 106.0 lb

## 2015-11-21 DIAGNOSIS — J069 Acute upper respiratory infection, unspecified: Secondary | ICD-10-CM | POA: Diagnosis not present

## 2015-11-21 MED ORDER — DOXYCYCLINE HYCLATE 100 MG PO TABS: 100.0000 mg | ORAL_TABLET | Freq: Two times a day (BID) | ORAL | Status: DC

## 2015-11-21 NOTE — Patient Instructions (Signed)
i have sent a prescription to your pharmacy, for an antibiotic pill. Loratadine-d (non-prescription) will help your congestion.  Due to your advanced age, take just half the dosage on the label. Please continue the same medication for your blood pressure. i'll see you next time.

## 2015-11-21 NOTE — Progress Notes (Signed)
Subjective:    Patient ID: [name], female    DOB: 1925-01-18, 82 y.o.   MRN: [id number]  HPI Pt states 10 days of slight pain at the maxillary area, and assoc nasal congestion.  Low-back pain persists. i have been called several times by [facility], reporting elevated BP. Past Medical History  Diagnosis Date  . Arrhythmia     paroxysmal atrial fibrillation  . Coronary artery disease   . Hypertension   . Hyperlipidemia   . Edema   . GERD (gastroesophageal reflux disease)   . Anemia B twelve deficiency   . Dizziness - giddy   . Abdominal pain   . Iron deficiency anemia, unspecified   . Pernicious anemia   . Thoracic or lumbosacral neuritis or radiculitis, unspecified   . Symptomatic menopausal or female climacteric states   . Lumbago   . Benign neoplasm of colon   . Allergic rhinitis, cause unspecified   . Type II diabetes mellitus ([place])   . Degeneration of lumbar or lumbosacral intervertebral disc   . Arthritis     "arms and hands" (06/30/2013)    Past Surgical History  Procedure Laterality Date  . Back surgery    . Cholecystectomy    . Appendectomy  1974  . Abdominal hysterectomy  1974  . Carpal tunnel release Bilateral 1970's  . Cataract extraction, bilateral Bilateral   . Cardiac catheterization    . Coronary angioplasty with stent placement      "1" (06/30/2013  . Lumbar disc surgery      Social History   Social History  . Marital Status: Widowed    Spouse Name: N/A  . Number of Children: 8  . Years of Education: 10th Grade   Occupational History  . Not on file.   Social History Main Topics  . Smoking status: Never Smoker   . Smokeless tobacco: Current User    Types: Snuff  . Alcohol Use: No  . Drug Use: No  . Sexual Activity: No   Other Topics Concern  . Not on file   Social History Narrative   Patient lives with daughter in a two story home.   Patient has a 10th grade education.   Patient is retired.   Patient is right handed.     Current Outpatient Prescriptions on File Prior to Visit  Medication Sig Dispense Refill  . apixaban (ELIQUIS) 2.5 MG TABS tablet Take 1 tablet (2.5 mg total) by mouth 2 (two) times daily. 180 tablet 1  . atorvastatin (LIPITOR) 40 MG tablet Take 1 tablet (40 mg total) by mouth daily at 6 PM. 90 tablet 1  . calcium carbonate (TUMS - DOSED IN MG ELEMENTAL CALCIUM) 500 MG chewable tablet Chew 1 tablet by mouth as needed for heartburn.    . Cyanocobalamin 1000 MCG/ML LIQD Take 1,000 mg by mouth every 30 (thirty) days.    . diclofenac sodium (VOLTAREN) 1 % GEL Apply 4 g topically 4 (four) times daily. As needed for pain 100 g 5  . donepezil (ARICEPT) 5 MG tablet Take 1 tablet (5 mg total) by mouth at bedtime. 90 tablet 2  . FERREX 150 150 MG capsule TAKE 1 CAPSULE (150 MG TOTAL) BY MOUTH 2 (TWO) TIMES DAILY. (Patient taking differently: TAKE 1 CAPSULE (150 MG TOTAL) BY MOUTH DAILY.) 60 capsule 1  . LORazepam (ATIVAN) 0.5 MG tablet Take 1 tablet (0.5 mg total) by mouth every 8 (eight) hours as needed for anxiety. 30 tablet 2  . metFORMIN (  GLUCOPHAGE-XR) 500 MG 24 hr tablet TAKE ONE TABLET BY MOUTH TWICE DAILY 60 tablet 0  . metoprolol succinate (TOPROL-XL) 50 MG 24 hr tablet TAKE 1 TABLET (50 MG TOTAL) BY MOUTH DAILY. TAKE WITH OR IMMEDIATELY FOLLOWING A MEAL. 90 tablet 1  . nitroGLYCERIN (NITROSTAT) 0.4 MG SL tablet Place 0.4 mg under the tongue every 5 (five) minutes as needed.      [name] omeprazole (PRILOSEC) 40 MG capsule Take 40 mg by mouth daily as needed (heartburn).   11  . triamcinolone cream (KENALOG) 0.1 % Apply 1 application topically 4 (four) times daily. As needed for rash 80 g 3   No current facility-administered medications on file prior to visit.    Allergies  Allergen Reactions  . Penicillins Swelling    REACTION: swelling of joints  . Sulfonamide Derivatives Other (See Comments)    REACTION: "like my head was full of water"  . Albumin (Human) Other (See Comments)    Doesn't  remember   . Clindamycin Other (See Comments)    Doesn't remember   . Iodides Hives  . Iodinated Diagnostic Agents Hives  . Sulfa Antibiotics Hives  . Celecoxib Itching and Rash  . Erythromycin Itching and Rash  . Nitrofuran Derivatives Rash  . Nitrofurantoin Itching and Rash  . Piroxicam Other (See Comments)    unknown  . Povidone-Iodine Itching and Rash    Family History  Problem Relation Age of Onset  . Breast cancer Sister     "behind heart"  . Colon cancer Sister   . Colon cancer Sister     BP 134/76 mmHg  Pulse 77  Temp(Src) 98 F (36.7 C) (Oral)  Ht 5\' 2"  (1.575 m)  Wt 106 lb (48.081 kg)  BMI 19.38 kg/m2  SpO2 98%  Review of Systems She denies fever and cough.      Objective:   Physical Exam VITAL SIGNS:  See vs page GENERAL: no distress head: no deformity eyes: no periorbital swelling, no proptosis external nose and ears are normal mouth: no lesion seen Both tm's are slightly red LUNGS:  Clear to auscultation Spine: nontender.      X-rays of L-spine: OA     Assessment & Plan:  URI: new HTN: well-controlled.  i told pt and dtrs that for safety reasons, episodic BP elevations should not be treated.  OA of spine: this could be causing intermittent BP elevation.  Same voltaren gel.    Patient is advised the following: Patient Instructions  i have sent a prescription to your pharmacy, for an antibiotic pill. Loratadine-d (non-prescription) will help your congestion.  Due to your advanced age, take just half the dosage on the label. Please continue the same medication for your blood pressure. i'll see you next time.

## 2015-11-22 DIAGNOSIS — I69354 Hemiplegia and hemiparesis following cerebral infarction affecting left non-dominant side: Secondary | ICD-10-CM | POA: Diagnosis not present

## 2015-11-22 DIAGNOSIS — I10 Essential (primary) hypertension: Secondary | ICD-10-CM | POA: Diagnosis not present

## 2015-11-22 DIAGNOSIS — E119 Type 2 diabetes mellitus without complications: Secondary | ICD-10-CM | POA: Diagnosis not present

## 2015-11-22 DIAGNOSIS — M1990 Unspecified osteoarthritis, unspecified site: Secondary | ICD-10-CM | POA: Diagnosis not present

## 2015-11-22 DIAGNOSIS — F0390 Unspecified dementia without behavioral disturbance: Secondary | ICD-10-CM | POA: Diagnosis not present

## 2015-11-22 DIAGNOSIS — I480 Paroxysmal atrial fibrillation: Secondary | ICD-10-CM | POA: Diagnosis not present

## 2015-11-23 DIAGNOSIS — I10 Essential (primary) hypertension: Secondary | ICD-10-CM | POA: Diagnosis not present

## 2015-11-23 DIAGNOSIS — M1990 Unspecified osteoarthritis, unspecified site: Secondary | ICD-10-CM | POA: Diagnosis not present

## 2015-11-23 DIAGNOSIS — F0390 Unspecified dementia without behavioral disturbance: Secondary | ICD-10-CM | POA: Diagnosis not present

## 2015-11-23 DIAGNOSIS — I69354 Hemiplegia and hemiparesis following cerebral infarction affecting left non-dominant side: Secondary | ICD-10-CM | POA: Diagnosis not present

## 2015-11-23 DIAGNOSIS — I480 Paroxysmal atrial fibrillation: Secondary | ICD-10-CM | POA: Diagnosis not present

## 2015-11-23 DIAGNOSIS — E119 Type 2 diabetes mellitus without complications: Secondary | ICD-10-CM | POA: Diagnosis not present

## 2015-11-24 DIAGNOSIS — M1990 Unspecified osteoarthritis, unspecified site: Secondary | ICD-10-CM | POA: Diagnosis not present

## 2015-11-24 DIAGNOSIS — Z01419 Encounter for gynecological examination (general) (routine) without abnormal findings: Secondary | ICD-10-CM | POA: Diagnosis not present

## 2015-11-24 DIAGNOSIS — I480 Paroxysmal atrial fibrillation: Secondary | ICD-10-CM | POA: Diagnosis not present

## 2015-11-24 DIAGNOSIS — E119 Type 2 diabetes mellitus without complications: Secondary | ICD-10-CM | POA: Diagnosis not present

## 2015-11-24 DIAGNOSIS — I10 Essential (primary) hypertension: Secondary | ICD-10-CM | POA: Diagnosis not present

## 2015-11-24 DIAGNOSIS — I69354 Hemiplegia and hemiparesis following cerebral infarction affecting left non-dominant side: Secondary | ICD-10-CM | POA: Diagnosis not present

## 2015-11-24 DIAGNOSIS — F0390 Unspecified dementia without behavioral disturbance: Secondary | ICD-10-CM | POA: Diagnosis not present

## 2015-11-24 DIAGNOSIS — Z681 Body mass index (BMI) 19 or less, adult: Secondary | ICD-10-CM | POA: Diagnosis not present

## 2015-11-27 ENCOUNTER — Ambulatory Visit: Payer: Medicare Other | Admitting: Endocrinology

## 2015-11-27 DIAGNOSIS — E119 Type 2 diabetes mellitus without complications: Secondary | ICD-10-CM | POA: Diagnosis not present

## 2015-11-27 DIAGNOSIS — I480 Paroxysmal atrial fibrillation: Secondary | ICD-10-CM | POA: Diagnosis not present

## 2015-11-27 DIAGNOSIS — I69354 Hemiplegia and hemiparesis following cerebral infarction affecting left non-dominant side: Secondary | ICD-10-CM | POA: Diagnosis not present

## 2015-11-27 DIAGNOSIS — F0390 Unspecified dementia without behavioral disturbance: Secondary | ICD-10-CM | POA: Diagnosis not present

## 2015-11-27 DIAGNOSIS — I10 Essential (primary) hypertension: Secondary | ICD-10-CM | POA: Diagnosis not present

## 2015-11-27 DIAGNOSIS — M1990 Unspecified osteoarthritis, unspecified site: Secondary | ICD-10-CM | POA: Diagnosis not present

## 2015-11-28 ENCOUNTER — Encounter: Payer: Self-pay | Admitting: Internal Medicine

## 2015-11-28 DIAGNOSIS — I69354 Hemiplegia and hemiparesis following cerebral infarction affecting left non-dominant side: Secondary | ICD-10-CM | POA: Diagnosis not present

## 2015-11-28 DIAGNOSIS — I10 Essential (primary) hypertension: Secondary | ICD-10-CM | POA: Diagnosis not present

## 2015-11-28 DIAGNOSIS — I480 Paroxysmal atrial fibrillation: Secondary | ICD-10-CM | POA: Diagnosis not present

## 2015-11-28 DIAGNOSIS — M1990 Unspecified osteoarthritis, unspecified site: Secondary | ICD-10-CM | POA: Diagnosis not present

## 2015-11-28 DIAGNOSIS — E119 Type 2 diabetes mellitus without complications: Secondary | ICD-10-CM | POA: Diagnosis not present

## 2015-11-28 DIAGNOSIS — F0390 Unspecified dementia without behavioral disturbance: Secondary | ICD-10-CM | POA: Diagnosis not present

## 2015-11-29 ENCOUNTER — Other Ambulatory Visit: Payer: Self-pay | Admitting: Endocrinology

## 2015-11-30 DIAGNOSIS — I69354 Hemiplegia and hemiparesis following cerebral infarction affecting left non-dominant side: Secondary | ICD-10-CM | POA: Diagnosis not present

## 2015-11-30 DIAGNOSIS — I480 Paroxysmal atrial fibrillation: Secondary | ICD-10-CM | POA: Diagnosis not present

## 2015-11-30 DIAGNOSIS — F0390 Unspecified dementia without behavioral disturbance: Secondary | ICD-10-CM | POA: Diagnosis not present

## 2015-11-30 DIAGNOSIS — E119 Type 2 diabetes mellitus without complications: Secondary | ICD-10-CM | POA: Diagnosis not present

## 2015-11-30 DIAGNOSIS — I10 Essential (primary) hypertension: Secondary | ICD-10-CM | POA: Diagnosis not present

## 2015-11-30 DIAGNOSIS — M1990 Unspecified osteoarthritis, unspecified site: Secondary | ICD-10-CM | POA: Diagnosis not present

## 2015-12-01 ENCOUNTER — Telehealth: Payer: Self-pay | Admitting: Endocrinology

## 2015-12-01 MED ORDER — ONETOUCH ULTRASOFT LANCETS MISC: Status: DC

## 2015-12-01 MED ORDER — ONETOUCH ULTRA 2 W/DEVICE KIT: PACK | Status: DC

## 2015-12-01 MED ORDER — GLUCOSE BLOOD VI STRP: ORAL_STRIP | Status: DC

## 2015-12-01 NOTE — Telephone Encounter (Signed)
Please call in rx for new meter, she doesn't have one now and test strips and lancets Daughter is unaware of what will be covered.

## 2015-12-01 NOTE — Telephone Encounter (Signed)
Rx submitted

## 2015-12-04 DIAGNOSIS — I69354 Hemiplegia and hemiparesis following cerebral infarction affecting left non-dominant side: Secondary | ICD-10-CM | POA: Diagnosis not present

## 2015-12-04 DIAGNOSIS — M1990 Unspecified osteoarthritis, unspecified site: Secondary | ICD-10-CM | POA: Diagnosis not present

## 2015-12-04 DIAGNOSIS — E119 Type 2 diabetes mellitus without complications: Secondary | ICD-10-CM | POA: Diagnosis not present

## 2015-12-04 DIAGNOSIS — I10 Essential (primary) hypertension: Secondary | ICD-10-CM | POA: Diagnosis not present

## 2015-12-04 DIAGNOSIS — I480 Paroxysmal atrial fibrillation: Secondary | ICD-10-CM | POA: Diagnosis not present

## 2015-12-04 DIAGNOSIS — F0390 Unspecified dementia without behavioral disturbance: Secondary | ICD-10-CM | POA: Diagnosis not present

## 2015-12-05 DIAGNOSIS — I10 Essential (primary) hypertension: Secondary | ICD-10-CM | POA: Diagnosis not present

## 2015-12-05 DIAGNOSIS — M1990 Unspecified osteoarthritis, unspecified site: Secondary | ICD-10-CM | POA: Diagnosis not present

## 2015-12-05 DIAGNOSIS — I69354 Hemiplegia and hemiparesis following cerebral infarction affecting left non-dominant side: Secondary | ICD-10-CM | POA: Diagnosis not present

## 2015-12-05 DIAGNOSIS — F0390 Unspecified dementia without behavioral disturbance: Secondary | ICD-10-CM | POA: Diagnosis not present

## 2015-12-05 DIAGNOSIS — I480 Paroxysmal atrial fibrillation: Secondary | ICD-10-CM | POA: Diagnosis not present

## 2015-12-05 DIAGNOSIS — E119 Type 2 diabetes mellitus without complications: Secondary | ICD-10-CM | POA: Diagnosis not present

## 2015-12-06 DIAGNOSIS — I69354 Hemiplegia and hemiparesis following cerebral infarction affecting left non-dominant side: Secondary | ICD-10-CM | POA: Diagnosis not present

## 2015-12-06 DIAGNOSIS — I480 Paroxysmal atrial fibrillation: Secondary | ICD-10-CM | POA: Diagnosis not present

## 2015-12-06 DIAGNOSIS — M1990 Unspecified osteoarthritis, unspecified site: Secondary | ICD-10-CM | POA: Diagnosis not present

## 2015-12-06 DIAGNOSIS — I10 Essential (primary) hypertension: Secondary | ICD-10-CM | POA: Diagnosis not present

## 2015-12-06 DIAGNOSIS — F0390 Unspecified dementia without behavioral disturbance: Secondary | ICD-10-CM | POA: Diagnosis not present

## 2015-12-06 DIAGNOSIS — E119 Type 2 diabetes mellitus without complications: Secondary | ICD-10-CM | POA: Diagnosis not present

## 2015-12-07 DIAGNOSIS — I10 Essential (primary) hypertension: Secondary | ICD-10-CM | POA: Diagnosis not present

## 2015-12-07 DIAGNOSIS — F0390 Unspecified dementia without behavioral disturbance: Secondary | ICD-10-CM | POA: Diagnosis not present

## 2015-12-07 DIAGNOSIS — I480 Paroxysmal atrial fibrillation: Secondary | ICD-10-CM | POA: Diagnosis not present

## 2015-12-07 DIAGNOSIS — E119 Type 2 diabetes mellitus without complications: Secondary | ICD-10-CM | POA: Diagnosis not present

## 2015-12-07 DIAGNOSIS — M1990 Unspecified osteoarthritis, unspecified site: Secondary | ICD-10-CM | POA: Diagnosis not present

## 2015-12-07 DIAGNOSIS — I69354 Hemiplegia and hemiparesis following cerebral infarction affecting left non-dominant side: Secondary | ICD-10-CM | POA: Diagnosis not present

## 2015-12-11 DIAGNOSIS — E119 Type 2 diabetes mellitus without complications: Secondary | ICD-10-CM | POA: Diagnosis not present

## 2015-12-11 DIAGNOSIS — M1990 Unspecified osteoarthritis, unspecified site: Secondary | ICD-10-CM | POA: Diagnosis not present

## 2015-12-11 DIAGNOSIS — I69354 Hemiplegia and hemiparesis following cerebral infarction affecting left non-dominant side: Secondary | ICD-10-CM | POA: Diagnosis not present

## 2015-12-11 DIAGNOSIS — I480 Paroxysmal atrial fibrillation: Secondary | ICD-10-CM | POA: Diagnosis not present

## 2015-12-11 DIAGNOSIS — I10 Essential (primary) hypertension: Secondary | ICD-10-CM | POA: Diagnosis not present

## 2015-12-11 DIAGNOSIS — F0390 Unspecified dementia without behavioral disturbance: Secondary | ICD-10-CM | POA: Diagnosis not present

## 2015-12-12 DIAGNOSIS — I480 Paroxysmal atrial fibrillation: Secondary | ICD-10-CM | POA: Diagnosis not present

## 2015-12-12 DIAGNOSIS — I69354 Hemiplegia and hemiparesis following cerebral infarction affecting left non-dominant side: Secondary | ICD-10-CM | POA: Diagnosis not present

## 2015-12-12 DIAGNOSIS — I10 Essential (primary) hypertension: Secondary | ICD-10-CM | POA: Diagnosis not present

## 2015-12-12 DIAGNOSIS — F0390 Unspecified dementia without behavioral disturbance: Secondary | ICD-10-CM | POA: Diagnosis not present

## 2015-12-12 DIAGNOSIS — E119 Type 2 diabetes mellitus without complications: Secondary | ICD-10-CM | POA: Diagnosis not present

## 2015-12-12 DIAGNOSIS — M1990 Unspecified osteoarthritis, unspecified site: Secondary | ICD-10-CM | POA: Diagnosis not present

## 2015-12-13 DIAGNOSIS — F0390 Unspecified dementia without behavioral disturbance: Secondary | ICD-10-CM | POA: Diagnosis not present

## 2015-12-13 DIAGNOSIS — M1990 Unspecified osteoarthritis, unspecified site: Secondary | ICD-10-CM | POA: Diagnosis not present

## 2015-12-13 DIAGNOSIS — I10 Essential (primary) hypertension: Secondary | ICD-10-CM | POA: Diagnosis not present

## 2015-12-13 DIAGNOSIS — E119 Type 2 diabetes mellitus without complications: Secondary | ICD-10-CM | POA: Diagnosis not present

## 2015-12-13 DIAGNOSIS — I480 Paroxysmal atrial fibrillation: Secondary | ICD-10-CM | POA: Diagnosis not present

## 2015-12-13 DIAGNOSIS — I69354 Hemiplegia and hemiparesis following cerebral infarction affecting left non-dominant side: Secondary | ICD-10-CM | POA: Diagnosis not present

## 2015-12-14 DIAGNOSIS — I69354 Hemiplegia and hemiparesis following cerebral infarction affecting left non-dominant side: Secondary | ICD-10-CM | POA: Diagnosis not present

## 2015-12-14 DIAGNOSIS — E119 Type 2 diabetes mellitus without complications: Secondary | ICD-10-CM | POA: Diagnosis not present

## 2015-12-14 DIAGNOSIS — I10 Essential (primary) hypertension: Secondary | ICD-10-CM | POA: Diagnosis not present

## 2015-12-14 DIAGNOSIS — I480 Paroxysmal atrial fibrillation: Secondary | ICD-10-CM | POA: Diagnosis not present

## 2015-12-14 DIAGNOSIS — F0390 Unspecified dementia without behavioral disturbance: Secondary | ICD-10-CM | POA: Diagnosis not present

## 2015-12-14 DIAGNOSIS — M1990 Unspecified osteoarthritis, unspecified site: Secondary | ICD-10-CM | POA: Diagnosis not present

## 2015-12-18 DIAGNOSIS — E119 Type 2 diabetes mellitus without complications: Secondary | ICD-10-CM | POA: Diagnosis not present

## 2015-12-18 DIAGNOSIS — I69354 Hemiplegia and hemiparesis following cerebral infarction affecting left non-dominant side: Secondary | ICD-10-CM | POA: Diagnosis not present

## 2015-12-18 DIAGNOSIS — M1990 Unspecified osteoarthritis, unspecified site: Secondary | ICD-10-CM | POA: Diagnosis not present

## 2015-12-18 DIAGNOSIS — I10 Essential (primary) hypertension: Secondary | ICD-10-CM | POA: Diagnosis not present

## 2015-12-18 DIAGNOSIS — F0390 Unspecified dementia without behavioral disturbance: Secondary | ICD-10-CM | POA: Diagnosis not present

## 2015-12-18 DIAGNOSIS — I480 Paroxysmal atrial fibrillation: Secondary | ICD-10-CM | POA: Diagnosis not present

## 2015-12-20 DIAGNOSIS — I10 Essential (primary) hypertension: Secondary | ICD-10-CM | POA: Diagnosis not present

## 2015-12-20 DIAGNOSIS — E119 Type 2 diabetes mellitus without complications: Secondary | ICD-10-CM | POA: Diagnosis not present

## 2015-12-20 DIAGNOSIS — I480 Paroxysmal atrial fibrillation: Secondary | ICD-10-CM | POA: Diagnosis not present

## 2015-12-20 DIAGNOSIS — I69354 Hemiplegia and hemiparesis following cerebral infarction affecting left non-dominant side: Secondary | ICD-10-CM | POA: Diagnosis not present

## 2015-12-20 DIAGNOSIS — M1990 Unspecified osteoarthritis, unspecified site: Secondary | ICD-10-CM | POA: Diagnosis not present

## 2015-12-20 DIAGNOSIS — F0390 Unspecified dementia without behavioral disturbance: Secondary | ICD-10-CM | POA: Diagnosis not present

## 2015-12-21 ENCOUNTER — Ambulatory Visit (INDEPENDENT_AMBULATORY_CARE_PROVIDER_SITE_OTHER): Payer: Medicare Other | Admitting: Neurology

## 2015-12-21 ENCOUNTER — Encounter: Payer: Self-pay | Admitting: Neurology

## 2015-12-21 VITALS — BP 142/84 | HR 72 | Ht 62.0 in | Wt 106.0 lb

## 2015-12-21 DIAGNOSIS — R404 Transient alteration of awareness: Secondary | ICD-10-CM

## 2015-12-21 DIAGNOSIS — R569 Unspecified convulsions: Secondary | ICD-10-CM | POA: Diagnosis not present

## 2015-12-21 DIAGNOSIS — I4891 Unspecified atrial fibrillation: Secondary | ICD-10-CM

## 2015-12-21 DIAGNOSIS — I638 Other cerebral infarction: Secondary | ICD-10-CM

## 2015-12-21 DIAGNOSIS — I6389 Other cerebral infarction: Secondary | ICD-10-CM

## 2015-12-21 NOTE — Progress Notes (Signed)
NEUROLOGY FOLLOW UP OFFICE NOTE  [name] [id number]  HISTORY OF PRESENT ILLNESS: I had the pleasure of seeing [place] Gillooly in follow-up in the neurology clinic on 12/21/2015.  The patient was last seen 5 months ago for recurrent episodes of loss of consciousness and headaches. She is again accompanied by her 2 daughters who help supplement the history today.  Records and images were personally reviewed where available. Her 24-hour ambulatory EEG in October 2016 did not show any epileptiform discharges, there was occasional focal slowing over the left temporal region. In the interim, she was admitted to [facility] for a right subcortical stroke on 08/20/15. She started having slurred speech on 08/18/15 and was brought to the ER the next day where she was evaluated by Neurology with note of normal neurological exam, recent TIA workup, head CT unremarkable. She was discharged home then returned the next day due to weakness, inability to walk, and fall. Family felt left side was weaker. MRI brain showed an acute stroke in the posterior right lentiform nucleus extending superiorly into the centrum semiovale. MRA showed intracranial stenosis. She had some worsening of symptoms in the hospital, and after discussion with family, it was decided to start Eliquis. Her BP medications were adjusted for permissive hypertension. Since hospital discharge, she has been improving, strength is better on the left side. Family denies any episodes of loss of consciousness since August 2016. She still has occasional headaches that are relieved with Tylenol intake. She has right shoulder pain. They deny any falls, no dizziness, diplopia, speech is improved. She has some low back pain in her tailbone region. Family is concerned about diarrhea. She had been taking Aricept previously without any diarrhea complaints.   HPI: This is a pleasant 82 yo RH woman with a history of diabetes, hypertension, CAD, paroxysmal atrial  fibrillation, dementia, with a 5-6 year history of recurrent episodes of loss of consciousness and headaches. Prior to hospital admission in August 2016, she had a total of 5 or 6 of these episodes. Once or twice, her daughter has seen her stare, then go out. It appeared she would just go to sleep. She had urinary incontinence at least twice with these. No convulsive activity noted. Episodes would last 5-10 minutes, then she would come to a little confused. She had an episode in January 2015, the the next episode occurred in August 2016. She was brought to the ER on 07/11/15 for severe retro-orbital headaches, BP was noted to be 195/79. She was discharged home with improvement in headache and BP. She was brought back by family the next day for aphasia lasting 30-35 minutes. She was attempting to eat breakfast when she started "babbling" and appeared more confused. The cup she was holding was shaking and beginning to spill. This lasted a few minutes, and as she was improving, she said "why am I talking like this?" No focal weakness noted by family, she was arguing about what she wanted to wear when EMS arrived. She has no recollection of this. Per records, symptoms resolved upon EMS arrival. She was admitted for stroke workup. CBC, BMP were unremarkable except for glucose of 206. I personally reviewed MRI brain without contrast which did not show acute infarct, there was generalized atrophy and hydrocephalus ex vacuo, moderately advanced chronic microvascular disease. MRA showed intracranial atherosclerosis, no significant stenosis. Echo showed EF of 0000000, grade 1 diastolic dysfunction, normal left atrium. On her third hospital day, she had an episode while sitting on a chair, when  her whole body became stiff and she was unresponsive for 1-2 minutes. She was lowered to the ground and revived within a couple of sternal rubs. Vital signs were normal. Head CT was unremarkable. On further questioning, family reported  previous episodes of transient loss of consciousness where she would suddenly collapse with loss of bladder control. Her wake and drowsy EEG was normal. She was very confused during her hospital stay, "she tried to escape," and was started on Seroquel. She was discharged home back to baseline per family. She was brought back to the ER on 07/22/15 for slurred speech and lethargy, felt to be due to Seroquel, this has since been discontinued with no further similar episodes.   She feels her memory is pretty good. She lives with her daughter. Family started to notice memory changes several years ago, they administer her medications. They deny any hallucinations. She does not drive. She is taking Aricept 5mg  daily with no side effects. She denies any olfactory/gustatory hallucinations, deja vu, rising epigastric sensation, focal numbness/tingling/weakness, myoclonic jerks. There is a family history of seizures in her older sister, cousin, and grandson. Otherwise, she had a normal birth and early development. There is no history of febrile convulsions, CNS infections such as meningitis/encephalitis, significant traumatic brain injury, neurosurgical procedures,  PAST MEDICAL HISTORY: Past Medical History  Diagnosis Date  . Arrhythmia     paroxysmal atrial fibrillation  . Coronary artery disease   . Hypertension   . Hyperlipidemia   . Edema   . GERD (gastroesophageal reflux disease)   . Anemia B twelve deficiency   . Dizziness - giddy   . Abdominal pain   . Iron deficiency anemia, unspecified   . Pernicious anemia   . Thoracic or lumbosacral neuritis or radiculitis, unspecified   . Symptomatic menopausal or female climacteric states   . Lumbago   . Benign neoplasm of colon   . Allergic rhinitis, cause unspecified   . Type II diabetes mellitus ([place])   . Degeneration of lumbar or lumbosacral intervertebral disc   . Arthritis     "arms and hands" (06/30/2013)    MEDICATIONS: Current Outpatient  Prescriptions on File Prior to Visit  Medication Sig Dispense Refill  . apixaban (ELIQUIS) 2.5 MG TABS tablet Take 1 tablet (2.5 mg total) by mouth 2 (two) times daily. 180 tablet 1  . atorvastatin (LIPITOR) 40 MG tablet Take 1 tablet (40 mg total) by mouth daily at 6 PM. 90 tablet 1  . donepezil (ARICEPT) 5 MG tablet Take 1 tablet (5 mg total) by mouth at bedtime. 90 tablet 2  . FERREX 150 150 MG capsule TAKE 1 CAPSULE (150 MG TOTAL) BY MOUTH 2 (TWO) TIMES DAILY. (Patient taking differently: TAKE 1 CAPSULE (150 MG TOTAL) BY MOUTH DAILY.) 60 capsule 1  . metFORMIN (GLUCOPHAGE-XR) 500 MG 24 hr tablet TAKE ONE TABLET BY MOUTH TWICE DAILY 60 tablet 0  . metoprolol succinate (TOPROL-XL) 50 MG 24 hr tablet TAKE 1 TABLET (50 MG TOTAL) BY MOUTH DAILY. TAKE WITH OR IMMEDIATELY FOLLOWING A MEAL. 90 tablet 1  . nitroGLYCERIN (NITROSTAT) 0.4 MG SL tablet Place 0.4 mg under the tongue every 5 (five) minutes as needed. Reported on 12/21/2015     No current facility-administered medications on file prior to visit.    ALLERGIES: Allergies  Allergen Reactions  . Penicillins Swelling    REACTION: swelling of joints  . Sulfonamide Derivatives Other (See Comments)    REACTION: "like my head was full of water"  .  Albumin (Human) Other (See Comments)    Doesn't remember   . Clindamycin Other (See Comments)    Doesn't remember   . Iodides Hives  . Iodinated Diagnostic Agents Hives  . Sulfa Antibiotics Hives  . Celecoxib Itching and Rash  . Erythromycin Itching and Rash  . Nitrofuran Derivatives Rash  . Nitrofurantoin Itching and Rash  . Piroxicam Other (See Comments)    unknown  . Povidone-Iodine Itching and Rash    FAMILY HISTORY: Family History  Problem Relation Age of Onset  . Breast cancer Sister     "behind heart"  . Colon cancer Sister   . Colon cancer Sister     SOCIAL HISTORY: Social History   Social History  . Marital Status: Widowed    Spouse Name: N/A  . Number of Children: 8    . Years of Education: 10th Grade   Occupational History  . Not on file.   Social History Main Topics  . Smoking status: Never Smoker   . Smokeless tobacco: Current User    Types: Snuff  . Alcohol Use: No  . Drug Use: No  . Sexual Activity: No   Other Topics Concern  . Not on file   Social History Narrative   Patient lives with daughter in a two story home.   Patient has a 10th grade education.   Patient is retired.   Patient is right handed.    REVIEW OF SYSTEMS: Constitutional: No fevers, chills, or sweats, no generalized fatigue, change in appetite Eyes: No visual changes, double vision, eye pain Ear, nose and throat: No hearing loss, ear pain, nasal congestion, sore throat Cardiovascular: No chest pain, palpitations Respiratory:  No shortness of breath at rest or with exertion, wheezes GastrointestinaI: No nausea, vomiting, diarrhea, abdominal pain, fecal incontinence Genitourinary:  No dysuria, urinary retention or frequency Musculoskeletal:  No neck pain, +back pain Integumentary: No rash, pruritus, skin lesions Neurological: as above Psychiatric: No depression, insomnia, anxiety Endocrine: No palpitations, fatigue, diaphoresis, mood swings, change in appetite, change in weight, increased thirst Hematologic/Lymphatic:  No anemia, purpura, petechiae. Allergic/Immunologic: no itchy/runny eyes, nasal congestion, recent allergic reactions, rashes  PHYSICAL EXAM: Filed Vitals:   12/21/15 0820  BP: 142/84  Pulse: 72   General: No acute distress Head:  Normocephalic/atraumatic Neck: supple, no paraspinal tenderness, full range of motion Heart:  Regular rate and rhythm Lungs:  Clear to auscultation bilaterally Back: No paraspinal tenderness Skin/Extremities: No rash, no edema Neurological Exam: alert and oriented to person, place, and month. No aphasia or dysarthria. Fund of knowledge is appropriate.  Remote memory intact. 0/3 delayed recall. Attention and  concentration are normal.    Able to name objects and repeat phrases. Cranial nerves: Pupils equal, round, reactive to light. Extraocular movements intact with no nystagmus. Visual fields full. Facial sensation intact. No facial asymmetry. Tongue, uvula, palate midline.  Motor: Bulk and tone normal, muscle strength 4/5 on left UE, otherwise 5/5 throughout with right shoulder pain.  Sensation to light touch intact.  No extinction to double simultaneous stimulation.  Deep tendon reflexes 2+ throughout, toes downgoing.  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  Gait slow and cautious, no ataxia. Romberg negative.  IMPRESSION: This is a pleasant 82 yo RH woman with a history of hypertension, diabetes, CAD, atrial fibrillation, mild to moderate dementia, who presented for evaluation of possible seizures. She has been having recurrent episodes of loss of consciousness over the past 5-6 years, admitted in August  2016 for transient aphasia, and during her hospital stay had an episode of generalized stiffening and unresponsiveness. She does have risk factors for seizures with dementia and family history of seizures. Convulsive syncope is also a consideration. Her 24-hour EEG did not show any epileptiform discharges, there was occasional focal slowing over the left temporal region. She had a subcortical stroke in October 2016 and is now on Eliquis. No further episodes of loss of consciousness since August 2016. EEG findings were discussed with the patient and her daughters, at this point, would hold off on starting anti-epileptic medication unless symptoms recur. Continue current medications, control of vascular risk factors. She knows to go to the ER if symptoms change. They are concerned about diarrhea, unclear if due to Aricept as she has been taking this in the past with no side effects. They will discuss diarrhea, back pain, and BP concerns with her PCP. She will follow-up in 8 months and knows  to call for any changes.   Thank you for allowing me to participate in her care.  Please do not hesitate to call for any questions or concerns.  The duration of this appointment visit was 25 minutes of face-to-face time with the patient.  Greater than 50% of this time was spent in counseling, explanation of diagnosis, planning of further management, and coordination of care.   Ellouise Newer, M.D.   CC: Dr. Loanne Drilling

## 2015-12-21 NOTE — Patient Instructions (Addendum)
1. Continue all your medications 2. Discuss diarrhea, back pain, and BP with your PCP 3. If any recurrence of weakness on one side, speech difficulties, go to ER immediately 4. Follow-up in 8 months, call for any changes

## 2015-12-25 DIAGNOSIS — M1990 Unspecified osteoarthritis, unspecified site: Secondary | ICD-10-CM | POA: Diagnosis not present

## 2015-12-25 DIAGNOSIS — I69354 Hemiplegia and hemiparesis following cerebral infarction affecting left non-dominant side: Secondary | ICD-10-CM | POA: Diagnosis not present

## 2015-12-25 DIAGNOSIS — F0390 Unspecified dementia without behavioral disturbance: Secondary | ICD-10-CM | POA: Diagnosis not present

## 2015-12-25 DIAGNOSIS — I480 Paroxysmal atrial fibrillation: Secondary | ICD-10-CM | POA: Diagnosis not present

## 2015-12-25 DIAGNOSIS — E119 Type 2 diabetes mellitus without complications: Secondary | ICD-10-CM | POA: Diagnosis not present

## 2015-12-25 DIAGNOSIS — I10 Essential (primary) hypertension: Secondary | ICD-10-CM | POA: Diagnosis not present

## 2015-12-26 ENCOUNTER — Telehealth: Payer: Self-pay | Admitting: Endocrinology

## 2015-12-26 MED ORDER — ONETOUCH ULTRASOFT LANCETS MISC: Status: DC

## 2015-12-26 MED ORDER — GLUCOSE BLOOD VI STRP: ORAL_STRIP | Status: DC

## 2015-12-26 NOTE — Telephone Encounter (Signed)
Rx submitted per pt's request for the onetouch ultra test strips and lancets.

## 2015-12-26 NOTE — Telephone Encounter (Signed)
Patient need Lancets and test strips to go to her new meter, send to [facility] on [address]

## 2015-12-27 DIAGNOSIS — F0390 Unspecified dementia without behavioral disturbance: Secondary | ICD-10-CM | POA: Diagnosis not present

## 2015-12-27 DIAGNOSIS — E119 Type 2 diabetes mellitus without complications: Secondary | ICD-10-CM | POA: Diagnosis not present

## 2015-12-27 DIAGNOSIS — I10 Essential (primary) hypertension: Secondary | ICD-10-CM | POA: Diagnosis not present

## 2015-12-27 DIAGNOSIS — I69354 Hemiplegia and hemiparesis following cerebral infarction affecting left non-dominant side: Secondary | ICD-10-CM | POA: Diagnosis not present

## 2015-12-27 DIAGNOSIS — I480 Paroxysmal atrial fibrillation: Secondary | ICD-10-CM | POA: Diagnosis not present

## 2015-12-27 DIAGNOSIS — M1990 Unspecified osteoarthritis, unspecified site: Secondary | ICD-10-CM | POA: Diagnosis not present

## 2015-12-27 DIAGNOSIS — I639 Cerebral infarction, unspecified: Secondary | ICD-10-CM | POA: Insufficient documentation

## 2015-12-29 ENCOUNTER — Telehealth: Payer: Self-pay | Admitting: Endocrinology

## 2015-12-29 MED ORDER — METFORMIN HCL 500 MG PO TABS: 500.0000 mg | ORAL_TABLET | Freq: Two times a day (BID) | ORAL | Status: DC

## 2015-12-29 NOTE — Telephone Encounter (Signed)
i have sent a prescription to your pharmacy, to change to the immediate release, which you can crush. i'll see you next time.

## 2015-12-29 NOTE — Telephone Encounter (Signed)
Pt's daughter Enid Derry) advised of noted below and voiced understanding.

## 2015-12-29 NOTE — Telephone Encounter (Signed)
Wanted to be sure it was ok for them to be crushing the metformin to help her since the stroke and just noticed that the bottle says not to crush it

## 2015-12-29 NOTE — Telephone Encounter (Signed)
See note below and please advise, Thanks! 

## 2015-12-31 ENCOUNTER — Other Ambulatory Visit: Payer: Self-pay | Admitting: Endocrinology

## 2016-01-01 DIAGNOSIS — I10 Essential (primary) hypertension: Secondary | ICD-10-CM | POA: Diagnosis not present

## 2016-01-01 DIAGNOSIS — F0390 Unspecified dementia without behavioral disturbance: Secondary | ICD-10-CM | POA: Diagnosis not present

## 2016-01-01 DIAGNOSIS — I480 Paroxysmal atrial fibrillation: Secondary | ICD-10-CM | POA: Diagnosis not present

## 2016-01-01 DIAGNOSIS — M1990 Unspecified osteoarthritis, unspecified site: Secondary | ICD-10-CM | POA: Diagnosis not present

## 2016-01-01 DIAGNOSIS — I69354 Hemiplegia and hemiparesis following cerebral infarction affecting left non-dominant side: Secondary | ICD-10-CM | POA: Diagnosis not present

## 2016-01-01 DIAGNOSIS — E119 Type 2 diabetes mellitus without complications: Secondary | ICD-10-CM | POA: Diagnosis not present

## 2016-01-02 ENCOUNTER — Ambulatory Visit (INDEPENDENT_AMBULATORY_CARE_PROVIDER_SITE_OTHER): Payer: Medicare Other | Admitting: Cardiology

## 2016-01-02 ENCOUNTER — Encounter: Payer: Self-pay | Admitting: Cardiology

## 2016-01-02 VITALS — BP 190/90 | HR 84 | Ht 62.0 in | Wt 99.0 lb

## 2016-01-02 DIAGNOSIS — I10 Essential (primary) hypertension: Secondary | ICD-10-CM | POA: Diagnosis not present

## 2016-01-02 MED ORDER — AMLODIPINE BESYLATE 5 MG PO TABS: 5.0000 mg | ORAL_TABLET | Freq: Every day | ORAL | Status: DC

## 2016-01-02 NOTE — Progress Notes (Signed)
01/02/2016 [name]   1925/05/12  [id number]  Primary Physician Renato Shin, MD Primary Cardiologist: Angelena Form   Reason for Visit/CC: High Blood Pressure  HPI:  82 y/o female with history of CAD, atrial fibrillation, HTN, HLD, syncope who is here today for cardiac follow up. She was previously followed by Dr. Verl Blalock and is now followed by Dr. Angelena Form.  Last cath April 2011 with bare metal stent patent in LAD, moderate disease in mid Circumflex and mild disease in the RCA. She was last seen by Dr. Angelena Form 03/22/15 for routine f/u and she was noted to be stable from a cardiac standpoint. Since that time, she had a subcortical stroke in October 2016 and is now on Eliquis, per neurology. She is followed medically by Dr. Loanne Drilling.   She now presents to clinic today, with her daughters, with complaints of high blood pressure readings at home. Over the past 2 weeks, she has had persistently high systolic pressures in the 150s-180s. Her BP today is 190/90. She denies CP, dyspnea, orthopnea, PND, LEE, HA, dizziness, syncope/ near syncope. Her lisinopril/HCTZ was discontinued by Dr. Loanne Drilling for reasons unknown, ? Renal function. The only BP med that she is currently on is metoprolol 50 mg daily. She reports a low sodium diet.    Current Outpatient Prescriptions  Medication Sig Dispense Refill  . apixaban (ELIQUIS) 2.5 MG TABS tablet Take 1 tablet (2.5 mg total) by mouth 2 (two) times daily. 180 tablet 1  . atorvastatin (LIPITOR) 40 MG tablet Take 1 tablet (40 mg total) by mouth daily at 6 PM. 90 tablet 1  . donepezil (ARICEPT) 5 MG tablet Take 1 tablet (5 mg total) by mouth at bedtime. 90 tablet 2  . FERREX 150 150 MG capsule TAKE 1 CAPSULE (150 MG TOTAL) BY MOUTH 2 (TWO) TIMES DAILY. (Patient taking differently: TAKE 1 CAPSULE (150 MG TOTAL) BY MOUTH DAILY.) 60 capsule 1  . glucose blood (ONE TOUCH ULTRA TEST) test strip Use to check blood sugar 1 time per day. Dx Code: E11.9 100 each 12  .  Lancets (ONETOUCH ULTRASOFT) lancets Use to check blood sugar 1 time per day. Dx Code: E11.9 100 each 12  . metFORMIN (GLUCOPHAGE) 500 MG tablet Take 1 tablet (500 mg total) by mouth 2 (two) times daily with a meal. 180 tablet 3  . metoprolol succinate (TOPROL-XL) 50 MG 24 hr tablet TAKE 1 TABLET (50 MG TOTAL) BY MOUTH DAILY. TAKE WITH OR IMMEDIATELY FOLLOWING A MEAL. 90 tablet 1  . nitroGLYCERIN (NITROSTAT) 0.4 MG SL tablet Place 0.4 mg under the tongue every 5 (five) minutes as needed. Reported on 12/21/2015    . amLODipine (NORVASC) 5 MG tablet Take 1 tablet (5 mg total) by mouth daily. 30 tablet 3   No current facility-administered medications for this visit.    Allergies  Allergen Reactions  . Penicillins Swelling    REACTION: swelling of joints  . Sulfonamide Derivatives Other (See Comments)    REACTION: "like my head was full of water"  . Albumin (Human) Other (See Comments)    Doesn't remember   . Clindamycin Other (See Comments)    Doesn't remember   . Iodides Hives  . Iodinated Diagnostic Agents Hives  . Sulfa Antibiotics Hives  . Celecoxib Itching and Rash  . Erythromycin Itching and Rash  . Nitrofuran Derivatives Rash  . Nitrofurantoin Itching and Rash  . Piroxicam Other (See Comments)    unknown  . Povidone-Iodine Itching and Rash  Social History   Social History  . Marital Status: Widowed    Spouse Name: N/A  . Number of Children: 8  . Years of Education: 10th Grade   Occupational History  . Not on file.   Social History Main Topics  . Smoking status: Never Smoker   . Smokeless tobacco: Current User    Types: Snuff  . Alcohol Use: No  . Drug Use: No  . Sexual Activity: No   Other Topics Concern  . Not on file   Social History Narrative   Patient lives with daughter in a two story home.   Patient has a 10th grade education.   Patient is retired.   Patient is right handed.     Review of Systems: General: negative for chills, fever, night sweats  or weight changes.  Cardiovascular: negative for chest pain, dyspnea on exertion, edema, orthopnea, palpitations, paroxysmal nocturnal dyspnea or shortness of breath Dermatological: negative for rash Respiratory: negative for cough or wheezing Urologic: negative for hematuria Abdominal: negative for nausea, vomiting, diarrhea, bright red blood per rectum, melena, or hematemesis Neurologic: negative for visual changes, syncope, or dizziness All other systems reviewed and are otherwise negative except as noted above.    Blood pressure 190/90, pulse 84, height 5\' 2"  (1.575 m), weight 99 lb (44.906 kg).  General appearance: alert, cooperative and no distress Neck: no carotid bruit and no JVD Lungs: clear to auscultation bilaterally Heart: regular rate and rhythm, S1, S2 normal, no murmur, click, rub or gallop Extremities: no LEE Pulses: 2+ and symmetric Skin: warm and dry Neurologic: Grossly normal  EKG not perfomed  ASSESSMENT AND PLAN:   1. HTN: BP is elevated at 190/90 and has been elevated in the [id number] systolic at home. Continue metoprolol an add amlodipine, 5 mg daily. Continue low sodium diet. She is to monitor her BP at home. F/u in 7-10 days for repeat BP check with pharmacist. Patient instructed to bring her home monitor with her to assess its accuracy.  She is to notify us if any hypotension or development of symptoms.   2. CAD: s/p remote LAD stenting. Cath in 2011 showed patent stent. She denies any chest pain. Continue statin and BB.   3. PAF: patient with CVA in October. She was started on Eliquis, per neurology. She is tolerating this well w/o side effects. No falls. Exam reveals RRR. She denies any palpitations. Rate is controlled with metoprolol.   3. HLD: lipid panel 4 months ago showed an LDL of 150 mg/dL. She was taken off of Lipitor in the past but this was restarted by neurology after her stroke. We will recheck a FLP to reassess level when she returns for her  yearly f/u with Dr. Angelena Form.    4. CVA: now on Eliquis.   PLAN  F/u in 7-10 days for BP check in pharmacy clinic. F/u with Dr. Angelena Form in 3 months.   Lyda Jester PA-C 01/02/2016 10:17 AM

## 2016-01-02 NOTE — Patient Instructions (Signed)
Medication Instructions:  Your physician has recommended you make the following change in your medication:  1) START Amlodipine 5 mg daily  Labwork: None ordered  Testing/Procedures: None ordered  Follow-Up: Your physician recommends that you schedule a follow-up appointment in: 7-10 days with Gay Filler, pharmacist, for blood pressure check.  Your physician recommends that you schedule a follow-up appointment in: 3 months with Dr. Angelena Form  (with FLP & HFP labs)  If you need a refill on your cardiac medications before your next appointment, please call your pharmacy.  Thank you for choosing [facility]!!     Amlodipine tablets What is this medicine? AMLODIPINE (am LOE di peen) is a calcium-channel blocker. It affects the amount of calcium found in your heart and muscle cells. This relaxes your blood vessels, which can reduce the amount of work the heart has to do. This medicine is used to lower high blood pressure. It is also used to prevent chest pain. This medicine may be used for other purposes; ask your health care provider or pharmacist if you have questions. What should I tell my health care provider before I take this medicine? They need to know if you have any of these conditions: -heart problems like heart failure or aortic stenosis -liver disease -an unusual or allergic reaction to amlodipine, other medicines, foods, dyes, or preservatives -pregnant or trying to get pregnant -breast-feeding How should I use this medicine? Take this medicine by mouth with a glass of water. Follow the directions on the prescription label. Take your medicine at regular intervals. Do not take more medicine than directed. Talk to your pediatrician regarding the use of this medicine in children. Special care may be needed. This medicine has been used in children as young as 6. Persons over 66 years old may have a stronger reaction to this medicine and need smaller doses. Overdosage: If you  think you have taken too much of this medicine contact a poison control center or emergency room at once. NOTE: This medicine is only for you. Do not share this medicine with others. What if I miss a dose? If you miss a dose, take it as soon as you can. If it is almost time for your next dose, take only that dose. Do not take double or extra doses. What may interact with this medicine? -herbal or dietary supplements -local or general anesthetics -medicines for high blood pressure -medicines for prostate problems -rifampin This list may not describe all possible interactions. Give your health care provider a list of all the medicines, herbs, non-prescription drugs, or dietary supplements you use. Also tell them if you smoke, drink alcohol, or use illegal drugs. Some items may interact with your medicine. What should I watch for while using this medicine? Visit your doctor or health care professional for regular check ups. Check your blood pressure and pulse rate regularly. Ask your health care professional what your blood pressure and pulse rate should be, and when you should contact him or her. This medicine may make you feel confused, dizzy or lightheaded. Do not drive, use machinery, or do anything that needs mental alertness until you know how this medicine affects you. To reduce the risk of dizzy or fainting spells, do not sit or stand up quickly, especially if you are an older patient. Avoid alcoholic drinks; they can make you more dizzy. Do not suddenly stop taking amlodipine. Ask your doctor or health care professional how you can gradually reduce the dose. What side effects may I  notice from receiving this medicine? Side effects that you should report to your doctor or health care professional as soon as possible: -allergic reactions like skin rash, itching or hives, swelling of the face, lips, or tongue -breathing problems -changes in vision or hearing -chest pain -fast, irregular  heartbeat -swelling of legs or ankles Side effects that usually do not require medical attention (report to your doctor or health care professional if they continue or are bothersome): -dry mouth -facial flushing -nausea, vomiting -stomach gas, pain -tired, weak -trouble sleeping This list may not describe all possible side effects. Call your doctor for medical advice about side effects. You may report side effects to FDA at 1-800-FDA-1088. Where should I keep my medicine? Keep out of the reach of children. Store at room temperature between 59 and 86 degrees F (15 and 30 degrees C). Protect from light. Keep container tightly closed. Throw away any unused medicine after the expiration date. NOTE: This sheet is a summary. It may not cover all possible information. If you have questions about this medicine, talk to your doctor, pharmacist, or health care provider.    2016, Elsevier/Gold Standard. (2012-09-25 11:40:58)

## 2016-01-05 DIAGNOSIS — Z7689 Persons encountering health services in other specified circumstances: Secondary | ICD-10-CM

## 2016-01-09 ENCOUNTER — Ambulatory Visit (INDEPENDENT_AMBULATORY_CARE_PROVIDER_SITE_OTHER): Payer: Medicare Other | Admitting: Pharmacist

## 2016-01-09 VITALS — BP 152/72 | HR 72

## 2016-01-09 DIAGNOSIS — I10 Essential (primary) hypertension: Secondary | ICD-10-CM | POA: Diagnosis not present

## 2016-01-09 NOTE — Progress Notes (Signed)
Patient ID: [name]                 DOB: 04-14-2025, 82 yo                         MRN: [id number]     HPI: [name] is a 82 y.o. female referred by Lyda Jester to HTN clinic. PMH is significant for PAF s/p stroke in October 2016 (had not been on anticoagulation d/t to age, frailty, and fall risk), HTN, DM2, HLD, CAD, and syncope. Pt was seen by Brittainy 1 week ago - at that time, pt complained of high BP readings at home with systolic readings ranging 150s-180s. She started pt on amlodipine 5mg  daily. Pt presents today for f/u with her 2 daughters.  Pt denies symptoms of dizziness or headache. She checks her BP ~2x/day at home - average systolic readings [id number] with low of 130 and high of 160. She takes her amlodipine and Toprol in the AM. Daughters report that since pt had a stroke last October, her BP has fluctuated more. Of note, previous BP readings have been controlled - stroke was likely d/t patient's afib and lack of anticoagulation.  Home BP cuff: 160/76, pulse 77 Clinic BP reading: 152/72, pulse 72  Current HTN meds: Toprol 50mg  daily, amlodipine 5mg  daily Previously tried: lisinopril-HCTZ - was held in December 2015 during inpatient stay b/c "patient looked dry" with thought to resume outpatient - was never restarted BP goal: <150/57mmHg  Family History: Mother with DM, sister with cancer.  Social History: Patient reports that she has never smoked cigarettes, does not use illicit drugs, and does not drink alcohol.  Diet: Reports a low sodium diet and bakes all of her food.   Exercise: Less active since she had her stroke last fall but wants to start working in the garden more.  Home BP readings: average 140-150/90s. Highest systolic reading was 0000000 since starting amlodipine last week.  Wt Readings from Last 3 Encounters:  01/02/16 99 lb (44.906 kg)  12/21/15 106 lb (48.081 kg)  11/21/15 106 lb (48.081 kg)   BP Readings from Last 3 Encounters:    01/02/16 190/90  12/21/15 142/84  11/21/15 134/76   Pulse Readings from Last 3 Encounters:  01/02/16 84  12/21/15 72  11/21/15 77    Renal function: CrCl cannot be calculated (Patient has no serum creatinine result on file.).  Past Medical History  Diagnosis Date  . Arrhythmia     paroxysmal atrial fibrillation  . Coronary artery disease   . Hypertension   . Hyperlipidemia   . Edema   . GERD (gastroesophageal reflux disease)   . Anemia B twelve deficiency   . Dizziness - giddy   . Abdominal pain   . Iron deficiency anemia, unspecified   . Pernicious anemia   . Thoracic or lumbosacral neuritis or radiculitis, unspecified   . Symptomatic menopausal or female climacteric states   . Lumbago   . Benign neoplasm of colon   . Allergic rhinitis, cause unspecified   . Type II diabetes mellitus ([place])   . Degeneration of lumbar or lumbosacral intervertebral disc   . Arthritis     "arms and hands" (06/30/2013)    Current Outpatient Prescriptions on File Prior to Visit  Medication Sig Dispense Refill  . amLODipine (NORVASC) 5 MG tablet Take 1 tablet (5 mg total) by mouth daily. 30 tablet 3  . apixaban (ELIQUIS) 2.5 MG TABS tablet Take  1 tablet (2.5 mg total) by mouth 2 (two) times daily. 180 tablet 1  . atorvastatin (LIPITOR) 40 MG tablet Take 1 tablet (40 mg total) by mouth daily at 6 PM. 90 tablet 1  . donepezil (ARICEPT) 5 MG tablet Take 1 tablet (5 mg total) by mouth at bedtime. 90 tablet 2  . FERREX 150 150 MG capsule TAKE 1 CAPSULE (150 MG TOTAL) BY MOUTH 2 (TWO) TIMES DAILY. (Patient taking differently: TAKE 1 CAPSULE (150 MG TOTAL) BY MOUTH DAILY.) 60 capsule 1  . glucose blood (ONE TOUCH ULTRA TEST) test strip Use to check blood sugar 1 time per day. Dx Code: E11.9 100 each 12  . Lancets (ONETOUCH ULTRASOFT) lancets Use to check blood sugar 1 time per day. Dx Code: E11.9 100 each 12  . metFORMIN (GLUCOPHAGE) 500 MG tablet Take 1 tablet (500 mg total) by mouth 2 (two) times  daily with a meal. 180 tablet 3  . metoprolol succinate (TOPROL-XL) 50 MG 24 hr tablet TAKE 1 TABLET (50 MG TOTAL) BY MOUTH DAILY. TAKE WITH OR IMMEDIATELY FOLLOWING A MEAL. 90 tablet 1  . nitroGLYCERIN (NITROSTAT) 0.4 MG SL tablet Place 0.4 mg under the tongue every 5 (five) minutes as needed. Reported on 12/21/2015     No current facility-administered medications on file prior to visit.    Allergies  Allergen Reactions  . Penicillins Swelling    REACTION: swelling of joints  . Sulfonamide Derivatives Other (See Comments)    REACTION: "like my head was full of water"  . Albumin (Human) Other (See Comments)    Doesn't remember   . Clindamycin Other (See Comments)    Doesn't remember   . Iodides Hives  . Iodinated Diagnostic Agents Hives  . Sulfa Antibiotics Hives  . Celecoxib Itching and Rash  . Erythromycin Itching and Rash  . Nitrofuran Derivatives Rash  . Nitrofurantoin Itching and Rash  . Piroxicam Other (See Comments)    unknown  . Povidone-Iodine Itching and Rash     Assessment/Plan:  1. Hypertension - pt close to goal BP <150/44mmHg in clinic today with most reported home BP readings in 140s-150s. Since pt's home cuff measured ~8-53mmHg higher than clinic BP reading, most home readings are likely 130s-140s. Aside from high BP reading last week, clinic readings have been at goal as well. Will have pt continue amlodipine 5mg  daily and Toprol 50mg  daily. No medication changes given pt's age, frailty, and fall risk, plus previous stroke was d/t afib not uncontrolled HTN - do not want to push her BP too low. Advised pt and her daughters to call if her systolic BP is consistently >160-116mmHg at home. She will f/u with Dr. Angelena Form in June as scheduled.   Harvy Riera E. Makhia Vosler, PharmD, [place] [id number] [place], [place] [address] Phone: [phone]; Fax: [phone] 01/09/2016 9:36 AM

## 2016-01-12 ENCOUNTER — Telehealth: Payer: Self-pay | Admitting: Pharmacist

## 2016-01-12 NOTE — Telephone Encounter (Signed)
Returned pt's call and spoke with her daughter. She doesn't think their home BP cuff is working. It kept reading error and then would give BP readings of: 141/55, 143/55, 122/45, and 139/56. Did not make any medication changes at BP visit and BP historically has run 120-150s/60-80s. Low diastolic readings seem more like BP cuff error. Advised them to recheck BP at a local [facility] or [facility] and to call back if diastolic BP continued to read in the 40s or low 50s.

## 2016-01-12 NOTE — Telephone Encounter (Signed)
New Message  Pt called. Requests a call back to clarify what is too low on BP readings. Please call back to discuss

## 2016-01-18 ENCOUNTER — Telehealth: Payer: Self-pay | Admitting: Endocrinology

## 2016-01-18 NOTE — Telephone Encounter (Signed)
error 

## 2016-03-06 ENCOUNTER — Telehealth: Payer: Self-pay | Admitting: Endocrinology

## 2016-03-06 ENCOUNTER — Other Ambulatory Visit: Payer: Self-pay | Admitting: Endocrinology

## 2016-03-06 MED ORDER — OMEPRAZOLE 40 MG PO CPDR: 40.0000 mg | DELAYED_RELEASE_CAPSULE | Freq: Every day | ORAL | Status: DC

## 2016-03-06 NOTE — Telephone Encounter (Signed)
[facility] to [facility]

## 2016-03-06 NOTE — Telephone Encounter (Signed)
i sent refill

## 2016-03-06 NOTE — Telephone Encounter (Signed)
See below and please advised if ok send. Rx is not under the current med list.

## 2016-03-07 ENCOUNTER — Telehealth: Payer: Self-pay | Admitting: Endocrinology

## 2016-03-07 MED ORDER — OMEPRAZOLE 40 MG PO CPDR: 40.0000 mg | DELAYED_RELEASE_CAPSULE | Freq: Every day | ORAL | Status: DC

## 2016-03-07 NOTE — Telephone Encounter (Signed)
[facility] [address]

## 2016-03-07 NOTE — Telephone Encounter (Signed)
Per pt's request. Rx for prilosec has been submitted [facility] local [facility].

## 2016-04-07 ENCOUNTER — Other Ambulatory Visit: Payer: Self-pay | Admitting: Endocrinology

## 2016-04-09 ENCOUNTER — Other Ambulatory Visit: Payer: Self-pay

## 2016-04-09 ENCOUNTER — Telehealth: Payer: Self-pay | Admitting: Neurology

## 2016-04-09 MED ORDER — APIXABAN 2.5 MG PO TABS: 2.5000 mg | ORAL_TABLET | Freq: Two times a day (BID) | ORAL | Status: DC

## 2016-04-09 NOTE — Telephone Encounter (Signed)
Returned call to [place]. Notified her that Dr. Delice Lesch hasn't prescribed for patient looks like Dr. Loanne Drilling has been prescribing.

## 2016-04-09 NOTE — Telephone Encounter (Signed)
PT's daughter Enid Derry called and asked to get a refill on her prescription Aricept/Dawn CB# [phone]

## 2016-04-12 ENCOUNTER — Ambulatory Visit (INDEPENDENT_AMBULATORY_CARE_PROVIDER_SITE_OTHER): Payer: Medicare Other | Admitting: Endocrinology

## 2016-04-12 ENCOUNTER — Encounter: Payer: Self-pay | Admitting: Endocrinology

## 2016-04-12 VITALS — BP 132/74 | HR 81 | Temp 98.0°F | Ht 62.0 in | Wt 109.0 lb

## 2016-04-12 DIAGNOSIS — M25551 Pain in right hip: Secondary | ICD-10-CM | POA: Diagnosis not present

## 2016-04-12 DIAGNOSIS — I4891 Unspecified atrial fibrillation: Secondary | ICD-10-CM

## 2016-04-12 DIAGNOSIS — D509 Iron deficiency anemia, unspecified: Secondary | ICD-10-CM

## 2016-04-12 DIAGNOSIS — E118 Type 2 diabetes mellitus with unspecified complications: Secondary | ICD-10-CM | POA: Diagnosis not present

## 2016-04-12 DIAGNOSIS — E785 Hyperlipidemia, unspecified: Secondary | ICD-10-CM | POA: Diagnosis not present

## 2016-04-12 DIAGNOSIS — D518 Other vitamin B12 deficiency anemias: Secondary | ICD-10-CM

## 2016-04-12 DIAGNOSIS — N181 Chronic kidney disease, stage 1: Secondary | ICD-10-CM

## 2016-04-12 DIAGNOSIS — E1122 Type 2 diabetes mellitus with diabetic chronic kidney disease: Secondary | ICD-10-CM

## 2016-04-12 LAB — BASIC METABOLIC PANEL
BUN: 21 mg/dL (ref 6–23)
CO2: 29 mEq/L (ref 19–32)
Calcium: 9.6 mg/dL (ref 8.4–10.5)
Chloride: 104 mEq/L (ref 96–112)
Creatinine, Ser: 0.85 mg/dL (ref 0.40–1.20)
GFR: 80.92 mL/min (ref >60.00)
Glucose, Bld: 183 mg/dL — ABNORMAL HIGH (ref 70–99)
Potassium: 3.4 mEq/L — ABNORMAL LOW (ref 3.5–5.1)
Sodium: 141 mEq/L (ref 135–145)

## 2016-04-12 LAB — CBC WITH DIFFERENTIAL/PLATELET
Basophils Absolute: 0 10*3/uL (ref 0.0–0.1)
Basophils Relative: 0.4 % (ref 0.0–3.0)
Eosinophils Absolute: 0.2 10*3/uL (ref 0.0–0.7)
Eosinophils Relative: 2.4 % (ref 0.0–5.0)
HCT: 32.6 % — ABNORMAL LOW (ref 36.0–46.0)
Hemoglobin: 10.6 g/dL — ABNORMAL LOW (ref 12.0–15.0)
Lymphocytes Relative: 30.5 % (ref 12.0–46.0)
Lymphs Abs: 2.2 10*3/uL (ref 0.7–4.0)
MCHC: 32.5 g/dL (ref 30.0–36.0)
MCV: 88.1 fl (ref 78.0–100.0)
Monocytes Absolute: 0.6 10*3/uL (ref 0.1–1.0)
Monocytes Relative: 8.9 % (ref 3.0–12.0)
Neutro Abs: 4.2 10*3/uL (ref 1.4–7.7)
Neutrophils Relative %: 57.8 % (ref 43.0–77.0)
Platelets: 238 10*3/uL (ref 150.0–400.0)
RBC: 3.7 Mil/uL — ABNORMAL LOW (ref 3.87–5.11)
RDW: 15.4 % (ref 11.5–15.5)
WBC: 7.3 10*3/uL (ref 4.0–10.5)

## 2016-04-12 LAB — LIPID PANEL
Cholesterol: 148 mg/dL (ref 0–200)
HDL: 60.3 mg/dL (ref >39.00)
LDL Cholesterol: 75 mg/dL (ref 0–99)
NonHDL: 87.35
Total CHOL/HDL Ratio: 2
Triglycerides: 64 mg/dL (ref 0.0–149.0)
VLDL: 12.8 mg/dL (ref 0.0–40.0)

## 2016-04-12 LAB — HEPATIC FUNCTION PANEL
ALT: 14 U/L (ref 0–35)
AST: 20 U/L (ref 0–37)
Albumin: 4.2 g/dL (ref 3.5–5.2)
Alkaline Phosphatase: 69 U/L (ref 39–117)
Bilirubin, Direct: 0.2 mg/dL (ref 0.0–0.3)
Total Bilirubin: 0.8 mg/dL (ref 0.2–1.2)
Total Protein: 7 g/dL (ref 6.0–8.3)

## 2016-04-12 LAB — POCT GLYCOSYLATED HEMOGLOBIN (HGB A1C): Hemoglobin A1C: 7.3

## 2016-04-12 LAB — IBC PANEL
Iron: 39 ug/dL — ABNORMAL LOW (ref 42–145)
Saturation Ratios: 11.6 % — ABNORMAL LOW (ref 20.0–50.0)
Transferrin: 240 mg/dL (ref 212.0–360.0)

## 2016-04-12 LAB — TSH: TSH: 3.14 u[IU]/mL (ref 0.35–4.50)

## 2016-04-12 MED ORDER — TRAMADOL-ACETAMINOPHEN 37.5-325 MG PO TABS: 1.0000 | ORAL_TABLET | Freq: Four times a day (QID) | ORAL | Status: DC | PRN

## 2016-04-12 MED ORDER — DONEPEZIL HCL 10 MG PO TABS: 10.0000 mg | ORAL_TABLET | Freq: Every day | ORAL | Status: DC

## 2016-04-12 MED ORDER — DONEPEZIL HCL 10 MG PO TBDP: 10.0000 mg | ORAL_TABLET | Freq: Every day | ORAL | Status: DC

## 2016-04-12 MED ORDER — CYANOCOBALAMIN 1000 MCG/ML IJ SOLN
1000.0000 ug | Freq: Once | INTRAMUSCULAR | Status: AC
  Administered 2016-04-12: 1000 ug via INTRAMUSCULAR

## 2016-04-12 NOTE — Progress Notes (Signed)
we discussed code status.  pt requests DNR 

## 2016-04-12 NOTE — Patient Instructions (Addendum)
Here is a prescription for pain medication. i have sent a prescription to your pharmacy, to increase the memory pill. blood tests are requested for you today.  We'll let you know about the results. please consider these measures for your health:  minimize alcohol.  do not use tobacco products.  have a colonoscopy at least every 10 years from age 80.  Women should have an annual mammogram from age 52.  keep firearms safely stored.  always use seat belts.  have working smoke alarms in your home.  see an eye doctor and dentist regularly.  never drive under the influence of alcohol or drugs (including prescription drugs).  those with fair skin should take precautions against the sun. it is critically important to prevent falling down (keep floor areas well-lit, dry, and free of loose objects.  If you have a cane, walker, or wheelchair, you should use it, even for short trips around the house.  Wear flat-soled shoes.  Also, try not to rush) Please come back for a follow-up appointment in 4 months.

## 2016-04-12 NOTE — Progress Notes (Signed)
Subjective:    Patient ID: [name], female    DOB: 06/19/1925, 82 y.o.   MRN: [id number]  HPI Pt also returns for f/u of diabetes mellitus: DM type: 2 Dx'ed: [id number] Complications: CAD and CVA Therapy: 2 oral meds GDM: never DKA: never Severe hypoglycemia: never Pancreatitis: never Other: she has never taken insulin; she did not tolerate nateglinide or januvia.   Interval history: pt states few days of right hip pain, but no assoc numbness.   Past Medical History  Diagnosis Date  . Arrhythmia     paroxysmal atrial fibrillation  . Coronary artery disease   . Hypertension   . Hyperlipidemia   . Edema   . GERD (gastroesophageal reflux disease)   . Anemia B twelve deficiency   . Dizziness - giddy   . Abdominal pain   . Iron deficiency anemia, unspecified   . Pernicious anemia   . Thoracic or lumbosacral neuritis or radiculitis, unspecified   . Symptomatic menopausal or female climacteric states   . Lumbago   . Benign neoplasm of colon   . Allergic rhinitis, cause unspecified   . Type II diabetes mellitus ([place])   . Degeneration of lumbar or lumbosacral intervertebral disc   . Arthritis     "arms and hands" (06/30/2013)    Past Surgical History  Procedure Laterality Date  . Back surgery    . Cholecystectomy    . Appendectomy  1974  . Abdominal hysterectomy  1974  . Carpal tunnel release Bilateral 1970's  . Cataract extraction, bilateral Bilateral   . Cardiac catheterization    . Coronary angioplasty with stent placement      "1" (06/30/2013  . Lumbar disc surgery      Social History   Social History  . Marital Status: Widowed    Spouse Name: N/A  . Number of Children: 8  . Years of Education: 10th Grade   Occupational History  . Not on file.   Social History Main Topics  . Smoking status: Never Smoker   . Smokeless tobacco: Current User    Types: Snuff  . Alcohol Use: No  . Drug Use: No  . Sexual Activity: No   Other Topics Concern  . Not on  file   Social History Narrative   Patient lives with daughter in a two story home.   Patient has a 10th grade education.   Patient is retired.   Patient is right handed.    Current Outpatient Prescriptions on File Prior to Visit  Medication Sig Dispense Refill  . amLODipine (NORVASC) 5 MG tablet Take 1 tablet (5 mg total) by mouth daily. 30 tablet 3  . apixaban (ELIQUIS) 2.5 MG TABS tablet Take 1 tablet (2.5 mg total) by mouth 2 (two) times daily. 180 tablet 0  . atorvastatin (LIPITOR) 40 MG tablet Take 1 tablet (40 mg total) by mouth daily at 6 PM. 90 tablet 1  . FERREX 150 150 MG capsule TAKE 1 CAPSULE (150 MG TOTAL) BY MOUTH 2 (TWO) TIMES DAILY. (Patient taking differently: TAKE 1 CAPSULE (150 MG TOTAL) BY MOUTH DAILY.) 60 capsule 1  . glucose blood (ONE TOUCH ULTRA TEST) test strip Use to check blood sugar 1 time per day. Dx Code: E11.9 100 each 12  . Lancets (ONETOUCH ULTRASOFT) lancets Use to check blood sugar 1 time per day. Dx Code: E11.9 100 each 12  . metFORMIN (GLUCOPHAGE) 500 MG tablet Take 1 tablet (500 mg total) by mouth 2 (two) times daily  with a meal. 180 tablet 3  . metoprolol succinate (TOPROL-XL) 50 MG 24 hr tablet TAKE 1 TABLET (50 MG TOTAL) BY MOUTH DAILY. TAKE WITH OR IMMEDIATELY FOLLOWING A MEAL. 90 tablet 1  . nitroGLYCERIN (NITROSTAT) 0.4 MG SL tablet Place 0.4 mg under the tongue every 5 (five) minutes as needed. Reported on 12/21/2015    . omeprazole (PRILOSEC) 40 MG capsule Take 1 capsule (40 mg total) by mouth daily. 30 capsule 11   No current facility-administered medications on file prior to visit.    Allergies  Allergen Reactions  . Penicillins Swelling    REACTION: swelling of joints  . Sulfonamide Derivatives Other (See Comments)    REACTION: "like my head was full of water"  . Albumin (Human) Other (See Comments)    Doesn't remember   . Clindamycin Other (See Comments)    Doesn't remember   . Iodides Hives  . Iodinated Diagnostic Agents Hives  .  Sulfa Antibiotics Hives  . Celecoxib Itching and Rash  . Erythromycin Itching and Rash  . Nitrofuran Derivatives Rash  . Nitrofurantoin Itching and Rash  . Piroxicam Other (See Comments)    unknown  . Povidone-Iodine Itching and Rash    Family History  Problem Relation Age of Onset  . Breast cancer Sister     "behind heart"  . Colon cancer Sister   . Colon cancer Sister     BP 132/74 mmHg  Pulse 81  Temp(Src) 98 F (36.7 C) (Oral)  Ht 5\' 2"  (1.575 m)  Wt 109 lb (49.442 kg)  BMI 19.93 kg/m2  SpO2 98%   Review of Systems She also has chronic low back pain.  No LOC    Objective:   Physical Exam Vital signs: see vs page Gen: elderly, frail, no distress.  In wheelchair Right hip: nontender. Skin: no rash   Lab Results  Component Value Date   HGBA1C 7.3 04/12/2016   Lab Results  Component Value Date   WBC 7.3 04/12/2016   HGB 10.6* 04/12/2016   HCT 32.6* 04/12/2016   MCV 88.1 04/12/2016   PLT 238.0 04/12/2016   Lab Results  Component Value Date   IRON 39* 04/12/2016   TIBC 267 11/14/2014   FERRITIN 309.9* 09/20/2010      Assessment & Plan:  Hip pain, new, uncertain etiology.  She declines x-ray.  i rx'ed ultram DM: well-controlled: Please continue the same medication.  Anemia: she needs increased rx.  Pt is advised to take fe 1/day.       Subjective:   Patient here for Medicare annual wellness visit and management of other chronic and acute problems.     Risk factors: advanced age    81 of Physicians Providing Medical Care to Patient:  See "snapshot"   Activities of Daily Living: In your present state of health, do you have any difficulty performing the following activities (lives with dtr)?:  Preparing food and eating?: yes Bathing yourself: yes Getting dressed: No  Using the toilet: No  Moving around from place to place: No  In the past year have you fallen or had a near fall?:No    Home Safety: Has smoke detector and wears seat belts.  firearms are safely stored  Diet and Exercise  Current exercise habits: limited by frail elderly state Dietary issues discussed: pt reports a healthy diet   Depression Screen  Q1: Over the past two weeks, have you felt down, depressed or hopeless? no  Q2: Over the past two  weeks, have you felt little interest or pleasure in doing things? no   The following portions of the patient's history were reviewed and updated as appropriate: allergies, current medications, past family history, past medical history, past social history, past surgical history and problem list.   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   Msk: pt slowly performs "get-up-and-go" from a sitting position.  Cognitive Impairment Assessment: cognition, memory and judgment appear normal.  remembers 0/3 at 5 minutes.  excellent recall.  can easily read and write a sentence.  Alert, but oriented to self only   Assessment:   Medicare wellness utd on preventive parameters    Plan:   During the course of the visit the patient was educated and counseled about appropriate screening and preventive services including:       Fall prevention   Screening mammography  Bone densitometry screening  Diabetes screening  Nutrition counseling   Vaccines / LABS Zostavax / Pneumococcal Vaccine  today   Patient Instructions (the written plan) was given to the patient.  Renato Shin, MD

## 2016-04-17 ENCOUNTER — Encounter: Payer: Self-pay | Admitting: Cardiovascular Disease

## 2016-04-17 ENCOUNTER — Ambulatory Visit (INDEPENDENT_AMBULATORY_CARE_PROVIDER_SITE_OTHER): Payer: Medicare Other | Admitting: Cardiovascular Disease

## 2016-04-17 VITALS — BP 142/80 | HR 72 | Ht 62.0 in | Wt 109.0 lb

## 2016-04-17 DIAGNOSIS — I480 Paroxysmal atrial fibrillation: Secondary | ICD-10-CM

## 2016-04-17 DIAGNOSIS — E785 Hyperlipidemia, unspecified: Secondary | ICD-10-CM | POA: Diagnosis not present

## 2016-04-17 DIAGNOSIS — I10 Essential (primary) hypertension: Secondary | ICD-10-CM

## 2016-04-17 DIAGNOSIS — I2510 Atherosclerotic heart disease of native coronary artery without angina pectoris: Secondary | ICD-10-CM | POA: Diagnosis not present

## 2016-04-17 MED ORDER — AMLODIPINE BESYLATE 10 MG PO TABS: 10.0000 mg | ORAL_TABLET | Freq: Every day | ORAL | Status: DC

## 2016-04-17 NOTE — Progress Notes (Signed)
Chief Complaint  Patient presents with  . Coronary Artery Disease  . Hypertension    History of Present Illness: 82 yo female with history of CAD, atrial fibrillation, HTN, HLD, syncope who is here today for cardiac follow up. She has been followed in the past by Dr. Verl Blalock. Last cath April 2011 with bare metal stent patent in LAD, moderate disease in mid Circumflex, mild disease RCA. She had not been on anti-coagulation due to advanced age and fall risk but had a stroke in October 2016 and was placed on Eliquis. She was seen in follow up in our office February 2017 by Lyda Jester, PA-C and her BP was elevated. Norvasc added to metoprolol at that time. Statin restarted after CVA.   She tells me that she feels well. No chest pain or SOB. No palpitations.   Primary Care Physician: Renato Shin, MD   Past Medical History  Diagnosis Date  . Arrhythmia     paroxysmal atrial fibrillation  . Coronary artery disease   . Hypertension   . Hyperlipidemia   . Edema   . GERD (gastroesophageal reflux disease)   . Anemia B twelve deficiency   . Dizziness - giddy   . Abdominal pain   . Iron deficiency anemia, unspecified   . Pernicious anemia   . Thoracic or lumbosacral neuritis or radiculitis, unspecified   . Symptomatic menopausal or female climacteric states   . Lumbago   . Benign neoplasm of colon   . Allergic rhinitis, cause unspecified   . Type II diabetes mellitus ([place])   . Degeneration of lumbar or lumbosacral intervertebral disc   . Arthritis     "arms and hands" (06/30/2013)    Past Surgical History  Procedure Laterality Date  . Back surgery    . Cholecystectomy    . Appendectomy  1974  . Abdominal hysterectomy  1974  . Carpal tunnel release Bilateral 1970's  . Cataract extraction, bilateral Bilateral   . Cardiac catheterization    . Coronary angioplasty with stent placement      "1" (06/30/2013  . Lumbar disc surgery      Current Outpatient Prescriptions    Medication Sig Dispense Refill  . amLODipine (NORVASC) 10 MG tablet Take 1 tablet (10 mg total) by mouth daily. 90 tablet 3  . apixaban (ELIQUIS) 2.5 MG TABS tablet Take 1 tablet (2.5 mg total) by mouth 2 (two) times daily. 180 tablet 0  . atorvastatin (LIPITOR) 40 MG tablet Take 1 tablet (40 mg total) by mouth daily at 6 PM. 90 tablet 1  . donepezil (ARICEPT) 10 MG tablet Take 1 tablet (10 mg total) by mouth at bedtime. 90 tablet 3  . glucose blood (ONE TOUCH ULTRA TEST) test strip Use to check blood sugar 1 time per day. Dx Code: E11.9 100 each 12  . iron polysaccharides (FERREX 150) 150 MG capsule Take 150 mg by mouth daily.    . Lancets (ONETOUCH ULTRASOFT) lancets Use to check blood sugar 1 time per day. Dx Code: E11.9 100 each 12  . metFORMIN (GLUCOPHAGE) 500 MG tablet Take 1 tablet (500 mg total) by mouth 2 (two) times daily with a meal. 180 tablet 3  . metoprolol succinate (TOPROL-XL) 50 MG 24 hr tablet TAKE 1 TABLET (50 MG TOTAL) BY MOUTH DAILY. TAKE WITH OR IMMEDIATELY FOLLOWING A MEAL. 90 tablet 1  . nitroGLYCERIN (NITROSTAT) 0.4 MG SL tablet Place 0.4 mg under the tongue every 5 (five) minutes as needed for chest  pain. Reported on 12/21/2015    . omeprazole (PRILOSEC) 40 MG capsule Take 1 capsule (40 mg total) by mouth daily. 30 capsule 11  . traMADol-acetaminophen (ULTRACET) 37.5-325 MG tablet Take 1 tablet by mouth every 6 (six) hours as needed for moderate pain.     No current facility-administered medications for this visit.    Allergies  Allergen Reactions  . Penicillins Swelling    REACTION: swelling of joints  . Sulfonamide Derivatives Other (See Comments)    REACTION: "like my head was full of water"  . Albumin (Human) Other (See Comments)    Doesn't remember   . Clindamycin Other (See Comments)    Doesn't remember   . Iodides Hives  . Iodinated Diagnostic Agents Hives  . Sulfa Antibiotics Hives  . Celecoxib Itching and Rash  . Erythromycin Itching and Rash  .  Nitrofuran Derivatives Rash  . Nitrofurantoin Itching and Rash  . Piroxicam Other (See Comments)    unknown  . Povidone-Iodine Itching and Rash    Social History   Social History  . Marital Status: Widowed    Spouse Name: N/A  . Number of Children: 8  . Years of Education: 10th Grade   Occupational History  . Not on file.   Social History Main Topics  . Smoking status: Never Smoker   . Smokeless tobacco: Current User    Types: Snuff  . Alcohol Use: No  . Drug Use: No  . Sexual Activity: No   Other Topics Concern  . Not on file   Social History Narrative   Patient lives with daughter in a two story home.   Patient has a 10th grade education.   Patient is retired.   Patient is right handed.    Family History  Problem Relation Age of Onset  . Breast cancer Sister     "behind heart"  . Colon cancer Sister   . Colon cancer Sister     Review of Systems:  As stated in the HPI and otherwise negative.   BP 142/80 mmHg  Pulse 72  Ht 5\' 2"  (1.575 m)  Wt 109 lb (49.442 kg)  BMI 19.93 kg/m2  Physical Examination: General: Well developed, well nourished, NAD HEENT: OP clear, mucus membranes moist SKIN: warm, dry. No rashes. Neuro: No focal deficits Musculoskeletal: Muscle strength 5/5 all ext Psychiatric: Mood and affect normal Neck: No JVD, no carotid bruits, no thyromegaly, no lymphadenopathy. Lungs:Clear bilaterally, no wheezes, rhonci, crackles Cardiovascular: Regular rate and rhythm. No murmurs, gallops or rubs. Abdomen:Soft. Bowel sounds present. Non-tender.  Extremities: No lower extremity edema. Pulses are 2 + in the bilateral DP/PT.  EKG:  EKG is not ordered today. The ekg ordered today demonstrates   Recent Labs: 04/12/2016: ALT 14; BUN 21; Creatinine, Ser 0.85; Hemoglobin 10.6*; Platelets 238.0; Potassium 3.4*; Sodium 141; TSH 3.14   Lipid Panel    Component Value Date/Time   CHOL 148 04/12/2016 0827   TRIG 64.0 04/12/2016 0827   TRIG 80  09/04/2006 0837   HDL 60.30 04/12/2016 0827   CHOLHDL 2 04/12/2016 0827   CHOLHDL 3.9 CALC 09/04/2006 0837   VLDL 12.8 04/12/2016 0827   LDLCALC 75 04/12/2016 0827   LDLDIRECT 155.3 08/11/2008 1122   LDLDIRECT 139.5 09/04/2006 0837     Wt Readings from Last 3 Encounters:  04/17/16 109 lb (49.442 kg)  04/12/16 109 lb (49.442 kg)  01/02/16 99 lb (44.906 kg)     Other studies Reviewed: Additional studies/ records that were reviewed  today include: . Review of the above records demonstrates:    Assessment and Plan:   1. CAD: No recent chest pain to suggest unstable angina. Continue ASA, beta blocker, statin.     2. Paroxysmal atrial fibrillation: Maintaining sinus rhythm. She is on Eliquis for anti-coagulation and metoprolol for rate control.    3. HTN: BP slightly elevated today and has been up to 170 at home. Will increase Norvasc to 10 mg daily.    4. Hyperlipdemia: Continue statin. LDL at goal.     Current medicines are reviewed at length with the patient today.  The patient does not have concerns regarding medicines.  The following changes have been made:  no change  Labs/ tests ordered today include:  No orders of the defined types were placed in this encounter.    Disposition:   FU with me in 6  months  Signed, Lauree Chandler, MD 04/17/2016 9:28 AM    [facility] [address], [place], [place]  [address] Phone: [phone]; Fax: [phone]

## 2016-04-17 NOTE — Patient Instructions (Signed)
Medication Instructions:  Your physician has recommended you make the following change in your medication: Increase Norvasc to 10 mg by mouth daily.    Labwork: none  Testing/Procedures: none  Follow-Up: Your physician wants you to follow-up in: 6 months  You will receive a reminder letter in the mail two months in advance. If you don't receive a letter, please call our office to schedule the follow-up appointment.   Any Other Special Instructions Will Be Listed Below (If Applicable).     If you need a refill on your cardiac medications before your next appointment, please call your pharmacy.

## 2016-04-23 ENCOUNTER — Encounter (HOSPITAL_COMMUNITY): Payer: Self-pay | Admitting: Vascular Surgery

## 2016-04-23 ENCOUNTER — Emergency Department (HOSPITAL_COMMUNITY)
Admission: EM | Admit: 2016-04-23 | Discharge: 2016-04-23 | Disposition: A | Discharge status: Home / Self Care | Payer: Medicare Other | Attending: Emergency Medicine | Admitting: Emergency Medicine

## 2016-04-23 DIAGNOSIS — I2510 Atherosclerotic heart disease of native coronary artery without angina pectoris: Secondary | ICD-10-CM | POA: Insufficient documentation

## 2016-04-23 DIAGNOSIS — Z7984 Long term (current) use of oral hypoglycemic drugs: Secondary | ICD-10-CM | POA: Insufficient documentation

## 2016-04-23 DIAGNOSIS — I10 Essential (primary) hypertension: Secondary | ICD-10-CM | POA: Diagnosis not present

## 2016-04-23 DIAGNOSIS — Z85038 Personal history of other malignant neoplasm of large intestine: Secondary | ICD-10-CM | POA: Diagnosis not present

## 2016-04-23 DIAGNOSIS — Z7901 Long term (current) use of anticoagulants: Secondary | ICD-10-CM | POA: Insufficient documentation

## 2016-04-23 DIAGNOSIS — R55 Syncope and collapse: Secondary | ICD-10-CM | POA: Diagnosis not present

## 2016-04-23 DIAGNOSIS — R404 Transient alteration of awareness: Secondary | ICD-10-CM | POA: Diagnosis not present

## 2016-04-23 DIAGNOSIS — E119 Type 2 diabetes mellitus without complications: Secondary | ICD-10-CM | POA: Diagnosis not present

## 2016-04-23 DIAGNOSIS — E876 Hypokalemia: Secondary | ICD-10-CM | POA: Insufficient documentation

## 2016-04-23 DIAGNOSIS — E785 Hyperlipidemia, unspecified: Secondary | ICD-10-CM | POA: Diagnosis not present

## 2016-04-23 LAB — CBC WITH DIFFERENTIAL/PLATELET
Basophils Absolute: 0 10*3/uL (ref 0.0–0.1)
Basophils Relative: 0 %
Eosinophils Absolute: 0.1 10*3/uL (ref 0.0–0.7)
Eosinophils Relative: 2 %
HCT: 33.8 % — ABNORMAL LOW (ref 36.0–46.0)
Hemoglobin: 10.8 g/dL — ABNORMAL LOW (ref 12.0–15.0)
Lymphocytes Relative: 30 %
Lymphs Abs: 2.2 10*3/uL (ref 0.7–4.0)
MCH: 28.3 pg (ref 26.0–34.0)
MCHC: 32 g/dL (ref 30.0–36.0)
MCV: 88.7 fL (ref 78.0–100.0)
Monocytes Absolute: 0.5 10*3/uL (ref 0.1–1.0)
Monocytes Relative: 7 %
Neutro Abs: 4.4 10*3/uL (ref 1.7–7.7)
Neutrophils Relative %: 61 %
Platelets: 249 10*3/uL (ref 150–400)
RBC: 3.81 MIL/uL — ABNORMAL LOW (ref 3.87–5.11)
RDW: 14.5 % (ref 11.5–15.5)
WBC: 7.2 10*3/uL (ref 4.0–10.5)

## 2016-04-23 LAB — BASIC METABOLIC PANEL
Anion gap: 8 (ref 5–15)
BUN: 16 mg/dL (ref 6–20)
CO2: 24 mmol/L (ref 22–32)
Calcium: 9.5 mg/dL (ref 8.9–10.3)
Chloride: 106 mmol/L (ref 101–111)
Creatinine, Ser: 0.81 mg/dL (ref 0.44–1.00)
GFR calc Af Amer: >60 mL/min (ref >60)
GFR calc non Af Amer: >60 mL/min (ref >60)
Glucose, Bld: 167 mg/dL — ABNORMAL HIGH (ref 65–99)
Potassium: 3.3 mmol/L — ABNORMAL LOW (ref 3.5–5.1)
Sodium: 138 mmol/L (ref 135–145)

## 2016-04-23 MED ORDER — POTASSIUM CHLORIDE CRYS ER 20 MEQ PO TBCR
20.0000 meq | EXTENDED_RELEASE_TABLET | Freq: Once | ORAL | Status: AC
  Administered 2016-04-23: 20 meq via ORAL
  Filled 2016-04-23: qty 1

## 2016-04-23 NOTE — ED Provider Notes (Signed)
CSN: [id number]     Arrival date & time 04/23/16  0908 History   First MD Initiated Contact with Patient 04/23/16 [phone]     Chief Complaint  Patient presents with  . Loss of Consciousness     (Consider location/radiation/quality/duration/timing/severity/associated sxs/prior Treatment) HPI   82 year old female presenting with her daughter for evaluation after syncopal event. Happened shortly before arrival. Patient got up in her usual state of health and her daughter helped her with her normal morning routine. Shortly after she got out of the bath she being complaining that she felt very weak and needed to sit down. She felt nauseated, vomited and had a brief loss of consciousness. She was lowered to the ground. Her daughter is at bedside and witnessed this event. She estimates that she lost consciousness only for a few seconds. She had a quick return to baseline. Currently she has no complaints. She does remember feeling weak disc prior to this episode. She denies having any pain. No palpitations. No shortness of breath. Denies history of recurrent syncope. She did recently have her blood pressure medications adjusted during routine cardiology appointment last week. Norvasc was increased to 10 mg.   Past Medical History  Diagnosis Date  . Arrhythmia     paroxysmal atrial fibrillation  . Coronary artery disease   . Hypertension   . Hyperlipidemia   . Edema   . GERD (gastroesophageal reflux disease)   . Anemia B twelve deficiency   . Dizziness - giddy   . Abdominal pain   . Iron deficiency anemia, unspecified   . Pernicious anemia   . Thoracic or lumbosacral neuritis or radiculitis, unspecified   . Symptomatic menopausal or female climacteric states   . Lumbago   . Benign neoplasm of colon   . Allergic rhinitis, cause unspecified   . Type II diabetes mellitus ([place])   . Degeneration of lumbar or lumbosacral intervertebral disc   . Arthritis     "arms and hands" (06/30/2013)   Past  Surgical History  Procedure Laterality Date  . Back surgery    . Cholecystectomy    . Appendectomy  1974  . Abdominal hysterectomy  1974  . Carpal tunnel release Bilateral 1970's  . Cataract extraction, bilateral Bilateral   . Cardiac catheterization    . Coronary angioplasty with stent placement      "1" (06/30/2013  . Lumbar disc surgery     Family History  Problem Relation Age of Onset  . Breast cancer Sister     "behind heart"  . Colon cancer Sister   . Colon cancer Sister    Social History  Substance Use Topics  . Smoking status: Never Smoker   . Smokeless tobacco: Current User    Types: Snuff  . Alcohol Use: No   OB History    No data available     Review of Systems  All systems reviewed and negative, other than as noted in HPI.   Allergies  Penicillins; Sulfonamide derivatives; Albumin (human); Clindamycin; Iodides; Iodinated diagnostic agents; Sulfa antibiotics; Celecoxib; Erythromycin; Nitrofuran derivatives; Nitrofurantoin; Piroxicam; and Povidone-iodine  Home Medications   Prior to Admission medications   Medication Sig Start Date End Date Taking? Authorizing Provider  acetaminophen (TYLENOL) 325 MG tablet Take 325 mg by mouth every 6 (six) hours as needed (pain).   Yes Historical Provider, MD  amLODipine (NORVASC) 10 MG tablet Take 1 tablet (10 mg total) by mouth daily. 04/17/16  Yes Burnell Blanks, MD  apixaban Arne [place]) 2.5  MG TABS tablet Take 1 tablet (2.5 mg total) by mouth 2 (two) times daily. 04/09/16  Yes Burnell Blanks, MD  atorvastatin (LIPITOR) 40 MG tablet Take 1 tablet (40 mg total) by mouth daily at 6 PM. 10/03/15  Yes Burnell Blanks, MD  donepezil (ARICEPT) 10 MG tablet Take 1 tablet (10 mg total) by mouth at bedtime. 04/12/16  Yes Renato Shin, MD  ferrous sulfate 325 (65 FE) MG tablet Take 325 mg by mouth daily with breakfast.   Yes Historical Provider, MD  iron polysaccharides (FERREX 150) 150 MG capsule Take 150 mg by  mouth daily.   Yes Historical Provider, MD  metFORMIN (GLUCOPHAGE) 500 MG tablet Take 1 tablet (500 mg total) by mouth 2 (two) times daily with a meal. 12/29/15  Yes Renato Shin, MD  metoprolol succinate (TOPROL-XL) 50 MG 24 hr tablet TAKE 1 TABLET (50 MG TOTAL) BY MOUTH DAILY. TAKE WITH OR IMMEDIATELY FOLLOWING A MEAL. 10/30/15  Yes Burnell Blanks, MD  nitroGLYCERIN (NITROSTAT) 0.4 MG SL tablet Place 0.4 mg under the tongue every 5 (five) minutes as needed for chest pain. Reported on 12/21/2015   Yes Historical Provider, MD  OVER THE COUNTER MEDICATION Place 1 patch onto the skin daily as needed (pain). Arthritis pain reliever patch   Yes Historical Provider, MD  glucose blood (ONE TOUCH ULTRA TEST) test strip Use to check blood sugar 1 time per day. Dx Code: E11.9 12/26/15   Renato Shin, MD  Lancets [facility] ULTRASOFT) lancets Use to check blood sugar 1 time per day. Dx Code: E11.9 12/26/15   Renato Shin, MD  omeprazole (PRILOSEC) 40 MG capsule Take 1 capsule (40 mg total) by mouth daily. Patient not taking: Reported on 04/23/2016 03/07/16   Renato Shin, MD   BP 167/59 mmHg  Pulse 65  Temp(Src) 97.8 F (36.6 C) (Oral)  Resp 13  SpO2 100% Physical Exam  Constitutional: She appears well-developed and well-nourished. No distress.  HENT:  Head: Normocephalic and atraumatic.  Eyes: Conjunctivae are normal. Right eye exhibits no discharge. Left eye exhibits no discharge.  Neck: Neck supple.  Cardiovascular: Normal rate, regular rhythm and normal heart sounds.  Exam reveals no gallop and no friction rub.   No murmur heard. Pulmonary/Chest: Effort normal and breath sounds normal. No respiratory distress.  Abdominal: Soft. She exhibits no distension. There is no tenderness.  Musculoskeletal: She exhibits no edema or tenderness.  Neurological: She is alert.  Skin: Skin is warm and dry.  Psychiatric: She has a normal mood and affect. Her behavior is normal. Thought content normal.  Nursing  note and vitals reviewed.   ED Course  Procedures (including critical care time) Labs Review Labs Reviewed  CBC WITH DIFFERENTIAL/PLATELET - Abnormal; Notable for the following:    RBC 3.81 (*)    Hemoglobin 10.8 (*)    HCT 33.8 (*)    All other components within normal limits  BASIC METABOLIC PANEL - Abnormal; Notable for the following:    Potassium 3.3 (*)    Glucose, Bld 167 (*)    All other components within normal limits    Imaging Review No results found. I have personally reviewed and evaluated these images and lab results as part of my medical decision-making.   EKG Interpretation   Date/Time:  Tuesday April 23 2016 09:20:11 EDT Ventricular Rate:  76 PR Interval:  256 QRS Duration: 79 QT Interval:  400 QTC Calculation: 450 R Axis:   -5 Text Interpretation:  Sinus rhythm  Prolonged PR interval Abnormal R-wave  progression, early transition Left ventricular hypertrophy Confirmed by  Wilson Singer  MD, Jamiel Goncalves ([id number]) on 04/23/2016 10:58:37 AM      MDM   Final diagnoses:  Syncope and collapse   82 year old female with what sounds like a syncopal event. No new complaints. Her exam is reassuring. Basic labs and EKG were checked. Minimal hypokalemia which I doubt is contributory. Mild anemia which is stable. Not exactly sure the exact etiology of this event, but I do not have a strong suspicion for emergent process. At this time I feel she is stable for discharge. Return precautions were discussed with patient and family.    Virgel Manifold, MD 04/23/16 1112

## 2016-04-23 NOTE — Discharge Instructions (Signed)

## 2016-04-23 NOTE — ED Notes (Signed)
Pt reports to the ED for eval of witnessed syncopal episode. Pts family lowered her to the ground. Episode lasted approx 30 seconds. Pt has no memory of event. Pt has hx of seizures but she is not on any medication for them and this episode was unlike her seizure-like episodes. 12 lead en route unremarkable. Pts family reports she had an episode of food character emesis prior to the syncopal episode. Pt denies any pain at this time but she is complaining of some residual nausea. Pt alert, resp e/u, and skin warm and dry.

## 2016-04-24 ENCOUNTER — Telehealth: Payer: Self-pay | Admitting: Cardiovascular Disease

## 2016-04-24 DIAGNOSIS — I10 Essential (primary) hypertension: Secondary | ICD-10-CM

## 2016-04-24 MED ORDER — AMLODIPINE BESYLATE 5 MG PO TABS: 5.0000 mg | ORAL_TABLET | Freq: Every day | ORAL | Status: DC

## 2016-04-24 MED ORDER — POTASSIUM CHLORIDE ER 10 MEQ PO TBCR: 10.0000 meq | EXTENDED_RELEASE_TABLET | Freq: Every day | ORAL | Status: DC

## 2016-04-24 MED ORDER — ATORVASTATIN CALCIUM 40 MG PO TABS: 40.0000 mg | ORAL_TABLET | Freq: Every day | ORAL | Status: DC

## 2016-04-24 NOTE — Telephone Encounter (Signed)
ED note reviewed.  I spoke with pt's daughter who reports they gave pt increased dose of 10 mg amlodipine for the first time yesterday morning. About an hour later pt had syncopal episode.  This occurred after taking a hot bath. Daughter reports pt usually takes a shower but wanted to take a bath yesterday.  I explained to daughter that hot bath could have contributed to syncopal episode and recommended pt take a shower instead.  Today pt is feeling fine. Daughter gave her previous dose of amlodipine 5 mg this morning.  Potassium was 3.3 in ED. Daughter reports pt was given potassium in the ED but was not sent home on daily dose.  Will review with provider in office.

## 2016-04-24 NOTE — Telephone Encounter (Signed)
Reviewed with Dr. Burt Knack and pt should decrease amlodipine to 5 mg daily. She should start K-dur 10 meq by mouth daily.  Will need BMP in 2-3 weeks.  Record blood pressure and bring readings in when here for lab work.  I spoke with pt's daughter and gave her information from Dr. Burt Knack. Will send prescriptions to [facility] on [address]. She would like me to send in prescription for 5 mg amlodipine.  Pt will come in for BMP on 6/29.

## 2016-04-24 NOTE — Telephone Encounter (Signed)
New Message   Pt c/o Syncope: STAT if syncope occurred within 30 minutes and pt complains of lightheadedness High Priority if episode of passing out, completely, today or in last 24 hours   1. Did you pass out today? no  2. When is the last time you passed out? 6/13 am  3. Has this occurred multiple times? Once   4. Did you have any symptoms prior to passing out? No, after taking bp med  Pt c/o medication issue:  1. Name of Medication: Amlodipine  2. How are you currently taking this medication (dosage and times per day)? Was 5mg , up it to 10mg ; Only took once   3. Are you having a reaction (difficulty breathing--STAT)? Passed out, threw up   4. What is your medication issue? Pt daughter states pt past out b/c of the high dosage

## 2016-04-26 ENCOUNTER — Telehealth: Payer: Self-pay | Admitting: Endocrinology

## 2016-04-26 ENCOUNTER — Telehealth: Payer: Self-pay | Admitting: Cardiovascular Disease

## 2016-04-26 NOTE — Telephone Encounter (Signed)
Spoke with pt dtr, she will try maybe cutting the potassium in 1/2 and taking it in 2 doses. Or she may change to take the potassium with her biggest meal of the day. They will try several things and if she cont to have problems they will let us know.

## 2016-04-26 NOTE — Telephone Encounter (Signed)
I contacted the pt's daughter. She stated the pt believes the nausea is coming from the potassium medication her cardiologist prescribed. Pt's daughter advised to f/u with cardiology about the potassium medication.

## 2016-04-26 NOTE — Telephone Encounter (Signed)
NeW Message  Pt dtr called to speak / rN- stated pt's new RX of potassium is causing nausea. Please call back and discuss.

## 2016-04-26 NOTE — Telephone Encounter (Signed)
The new pill is making her nauseous she just started it yesterday

## 2016-05-09 ENCOUNTER — Other Ambulatory Visit (INDEPENDENT_AMBULATORY_CARE_PROVIDER_SITE_OTHER): Payer: Medicare Other | Admitting: *Deleted

## 2016-05-09 DIAGNOSIS — I10 Essential (primary) hypertension: Secondary | ICD-10-CM

## 2016-05-09 LAB — BASIC METABOLIC PANEL
BUN: 20 mg/dL (ref 7–25)
CO2: 23 mmol/L (ref 20–31)
Calcium: 9.7 mg/dL (ref 8.6–10.4)
Chloride: 102 mmol/L (ref 98–110)
Creat: 0.77 mg/dL (ref 0.60–0.88)
Glucose, Bld: 114 mg/dL — ABNORMAL HIGH (ref 65–99)
Potassium: 4.2 mmol/L (ref 3.5–5.3)
Sodium: 138 mmol/L (ref 135–146)

## 2016-05-10 DIAGNOSIS — N390 Urinary tract infection, site not specified: Secondary | ICD-10-CM | POA: Diagnosis not present

## 2016-05-10 DIAGNOSIS — R109 Unspecified abdominal pain: Secondary | ICD-10-CM | POA: Diagnosis not present

## 2016-05-16 ENCOUNTER — Encounter (INDEPENDENT_AMBULATORY_CARE_PROVIDER_SITE_OTHER): Payer: Medicare Other

## 2016-05-16 DIAGNOSIS — E538 Deficiency of other specified B group vitamins: Secondary | ICD-10-CM

## 2016-05-16 MED ORDER — CYANOCOBALAMIN 1000 MCG/ML IJ SOLN
1000.0000 ug | Freq: Once | INTRAMUSCULAR | Status: AC
  Administered 2016-05-16: 1000 ug via INTRAMUSCULAR

## 2016-06-01 NOTE — Progress Notes (Signed)
This encounter was created in error - please disregard.

## 2016-06-12 ENCOUNTER — Other Ambulatory Visit: Payer: Self-pay | Admitting: Cardiovascular Disease

## 2016-06-18 ENCOUNTER — Ambulatory Visit (INDEPENDENT_AMBULATORY_CARE_PROVIDER_SITE_OTHER): Payer: Medicare Other

## 2016-06-18 DIAGNOSIS — E538 Deficiency of other specified B group vitamins: Secondary | ICD-10-CM

## 2016-06-18 MED ORDER — CYANOCOBALAMIN 1000 MCG/ML IJ SOLN: 1000.0000 ug | Freq: Once | INTRAMUSCULAR | 0 refills | Status: DC

## 2016-06-20 ENCOUNTER — Telehealth: Payer: Self-pay | Admitting: Endocrinology

## 2016-06-20 NOTE — Telephone Encounter (Signed)
PT daughter called she is wondering why there was a B12 inj and syringe called into the pharmacy. Requests call back.

## 2016-06-20 NOTE — Telephone Encounter (Signed)
I contacted the pt's daughter and advised the B12 injection was called in in error. She was advised to cancel the rx out and the pt will still receive her injections here in our office. Pt's daughter voiced understanding.

## 2016-06-21 MED ORDER — CYANOCOBALAMIN 1000 MCG/ML IJ SOLN
1000.0000 ug | Freq: Once | INTRAMUSCULAR | Status: AC
  Administered 2016-06-18: 1000 ug via INTRAMUSCULAR

## 2016-06-21 NOTE — Addendum Note (Signed)
Addended by: Caprice Beaver T on: 06/21/2016 09:00 AM   Modules accepted: Orders

## 2016-06-26 ENCOUNTER — Telehealth: Payer: Self-pay | Admitting: Endocrinology

## 2016-06-26 NOTE — Telephone Encounter (Signed)
Patient stated that she has a bruise on her are with a knot in, and it feels warm. Please advise on what to do

## 2016-06-26 NOTE — Telephone Encounter (Signed)
Please add on 9 AM, 06/27/16

## 2016-06-26 NOTE — Telephone Encounter (Signed)
See note below and please advise, Thanks! 

## 2016-06-27 NOTE — Telephone Encounter (Signed)
Message was not reviewed until today at 300 pm. I contacted the pt and advised we could see her tomorrow at 1 pm. Pt scheduled for this time.

## 2016-06-28 ENCOUNTER — Encounter: Payer: Self-pay | Admitting: Endocrinology

## 2016-06-28 ENCOUNTER — Ambulatory Visit (INDEPENDENT_AMBULATORY_CARE_PROVIDER_SITE_OTHER): Payer: Medicare Other | Admitting: Endocrinology

## 2016-06-28 VITALS — BP 128/60 | HR 75 | Wt 109.0 lb

## 2016-06-28 DIAGNOSIS — I2510 Atherosclerotic heart disease of native coronary artery without angina pectoris: Secondary | ICD-10-CM | POA: Diagnosis not present

## 2016-06-28 DIAGNOSIS — E1122 Type 2 diabetes mellitus with diabetic chronic kidney disease: Secondary | ICD-10-CM

## 2016-06-28 DIAGNOSIS — R300 Dysuria: Secondary | ICD-10-CM | POA: Diagnosis not present

## 2016-06-28 DIAGNOSIS — N181 Chronic kidney disease, stage 1: Secondary | ICD-10-CM

## 2016-06-28 LAB — URINALYSIS, ROUTINE W REFLEX MICROSCOPIC
Bilirubin Urine: NEGATIVE
Ketones, ur: NEGATIVE
Nitrite: NEGATIVE
Specific Gravity, Urine: 1.02 (ref 1.000–1.030)
Total Protein, Urine: 100 — AB
Urine Glucose: NEGATIVE
Urobilinogen, UA: 0.2 (ref 0.0–1.0)
pH: 5.5 (ref 5.0–8.0)

## 2016-06-28 LAB — POCT GLYCOSYLATED HEMOGLOBIN (HGB A1C): Hemoglobin A1C: 7.3

## 2016-06-28 MED ORDER — CYANOCOBALAMIN 500 MCG/0.1ML NA SOLN: 0.1000 mL | NASAL | 0 refills | Status: DC

## 2016-06-28 NOTE — Progress Notes (Signed)
Subjective:    Patient ID: [name], female    DOB: 06-17-1925, 82 y.o.   MRN: [id number]  HPI Pt states few days of slight pain at the left upper arm, and assoc bruising.  This was the site of a B-12 injection 10 days ago.   Past Medical History:  Diagnosis Date  . Abdominal pain   . Allergic rhinitis, cause unspecified   . Anemia B twelve deficiency   . Arrhythmia    paroxysmal atrial fibrillation  . Arthritis    "arms and hands" (06/30/2013)  . Benign neoplasm of colon   . Coronary artery disease   . Degeneration of lumbar or lumbosacral intervertebral disc   . Dizziness - giddy   . Edema   . GERD (gastroesophageal reflux disease)   . Hyperlipidemia   . Hypertension   . Iron deficiency anemia, unspecified   . Lumbago   . Pernicious anemia   . Symptomatic menopausal or female climacteric states   . Thoracic or lumbosacral neuritis or radiculitis, unspecified   . Type II diabetes mellitus ([place])     Past Surgical History:  Procedure Laterality Date  . ABDOMINAL HYSTERECTOMY  1974  . APPENDECTOMY  1974  . BACK SURGERY    . CARDIAC CATHETERIZATION    . CARPAL TUNNEL RELEASE Bilateral 1970's  . CATARACT EXTRACTION, BILATERAL Bilateral   . CHOLECYSTECTOMY    . CORONARY ANGIOPLASTY WITH STENT PLACEMENT     "1" (06/30/2013  . LUMBAR DISC SURGERY      Social History   Social History  . Marital status: Widowed    Spouse name: N/A  . Number of children: 8  . Years of education: 10th Grade   Occupational History  . Not on file.   Social History Main Topics  . Smoking status: Never Smoker  . Smokeless tobacco: Current User    Types: Snuff  . Alcohol use No  . Drug use: No  . Sexual activity: No   Other Topics Concern  . Not on file   Social History Narrative   Patient lives with daughter in a two story home.   Patient has a 10th grade education.   Patient is retired.   Patient is right handed.    Current Outpatient Prescriptions on File Prior to  Visit  Medication Sig Dispense Refill  . acetaminophen (TYLENOL) 325 MG tablet Take 325 mg by mouth every 6 (six) hours as needed (pain).    [name] amLODipine (NORVASC) 5 MG tablet Take 1 tablet (5 mg total) by mouth daily. 90 tablet 3  . apixaban (ELIQUIS) 2.5 MG TABS tablet Take 1 tablet (2.5 mg total) by mouth 2 (two) times daily. 180 tablet 0  . atorvastatin (LIPITOR) 40 MG tablet Take 1 tablet (40 mg total) by mouth daily. 90 tablet 3  . donepezil (ARICEPT) 10 MG tablet Take 1 tablet (10 mg total) by mouth at bedtime. 90 tablet 3  . ferrous sulfate 325 (65 FE) MG tablet Take 325 mg by mouth daily with breakfast.    . glucose blood (ONE TOUCH ULTRA TEST) test strip Use to check blood sugar 1 time per day. Dx Code: E11.9 100 each 12  . iron polysaccharides (FERREX 150) 150 MG capsule Take 150 mg by mouth daily.    . Lancets (ONETOUCH ULTRASOFT) lancets Use to check blood sugar 1 time per day. Dx Code: E11.9 100 each 12  . metFORMIN (GLUCOPHAGE) 500 MG tablet Take 1 tablet (500 mg total) by mouth  2 (two) times daily with a meal. 180 tablet 3  . metoprolol succinate (TOPROL-XL) 50 MG 24 hr tablet TAKE 1 TABLET BY MOUTH DAILY. TAKE WITH OR IMMEDIATELY FOLLOWING A MEAL. 90 tablet 2  . nitroGLYCERIN (NITROSTAT) 0.4 MG SL tablet Place 0.4 mg under the tongue every 5 (five) minutes as needed for chest pain. Reported on 12/21/2015    . omeprazole (PRILOSEC) 40 MG capsule Take 1 capsule (40 mg total) by mouth daily. 30 capsule 11  . OVER THE COUNTER MEDICATION Place 1 patch onto the skin daily as needed (pain). Arthritis pain reliever patch    . potassium chloride (K-DUR) 10 MEQ tablet Take 1 tablet (10 mEq total) by mouth daily. 90 tablet 3   No current facility-administered medications on file prior to visit.       BP 128/60   Pulse 75   Wt 109 lb (49.4 kg)   SpO2 97%   BMI 19.94 kg/m    Review of Systems Denies fever and sob.  She has slight dysuria.      Objective:   Physical Exam VITAL  SIGNS:  See vs page GENERAL: no distress Left upper arm: 7 cm area of slight swelling and ecchymosis.  Nontender.  FROM of the left shoulder, without pain.      Assessment & Plan:  Ecchymosis, due to injection, new B-12 deficiency.  dtr requests to change to a different type of delivery method.

## 2016-06-28 NOTE — Patient Instructions (Addendum)
A urine test is requested for you today.  We'll let you know about the results. I have sent a prescription to your pharmacy, to change the B-12 injections to a once a week spray.   I'll see you next time.   Please call if your left arm gets works.

## 2016-07-01 ENCOUNTER — Other Ambulatory Visit: Payer: Self-pay | Admitting: Obstetrics and Gynecology

## 2016-07-01 DIAGNOSIS — Z1231 Encounter for screening mammogram for malignant neoplasm of breast: Secondary | ICD-10-CM

## 2016-07-04 ENCOUNTER — Telehealth: Payer: Self-pay | Admitting: Endocrinology

## 2016-07-04 MED ORDER — CIPROFLOXACIN HCL 250 MG PO TABS: 250.0000 mg | ORAL_TABLET | Freq: Two times a day (BID) | ORAL | 0 refills | Status: DC

## 2016-07-04 NOTE — Telephone Encounter (Signed)
See note could you please advise what medication needs to be sent? Thanks!

## 2016-07-04 NOTE — Telephone Encounter (Signed)
Ok, I have sent a prescription to your pharmacy 

## 2016-07-04 NOTE — Telephone Encounter (Signed)
PT daughter called she stated that the pharmacy has not received the medication for the UTI yet.  PT needs it sent in.

## 2016-07-04 NOTE — Telephone Encounter (Addendum)
I contacted the patient daughter and advised prescription has been submitted. Daughter voiced understanding.

## 2016-07-08 ENCOUNTER — Other Ambulatory Visit: Payer: Self-pay | Admitting: Cardiovascular Disease

## 2016-07-09 ENCOUNTER — Telehealth: Payer: Self-pay | Admitting: Cardiovascular Disease

## 2016-07-09 MED ORDER — APIXABAN 2.5 MG PO TABS: 2.5000 mg | ORAL_TABLET | Freq: Two times a day (BID) | ORAL | 3 refills | Status: DC

## 2016-07-09 NOTE — Telephone Encounter (Signed)
New message   Pt only has meds for today!    *STAT* If patient is at the pharmacy, call can be transferred to refill team.   1. Which medications need to be refilled? (please list name of each medication and dose if known) Eliquis  2. Which pharmacy/location (including street and city if local pharmacy) is medication to be sent to?[facility] on [address]   3. Do they need a 30 day or 90 day supply? [place]

## 2016-07-09 NOTE — Telephone Encounter (Signed)
Prescription sent to pharmacy and pt's daughter notified.

## 2016-07-10 ENCOUNTER — Ambulatory Visit (INDEPENDENT_AMBULATORY_CARE_PROVIDER_SITE_OTHER): Payer: Medicare Other | Admitting: Endocrinology

## 2016-07-10 ENCOUNTER — Encounter: Payer: Self-pay | Admitting: Endocrinology

## 2016-07-10 ENCOUNTER — Ambulatory Visit
Admission: RE | Admit: 2016-07-10 | Discharge: 2016-07-10 | Disposition: A | Discharge status: Home / Self Care | Payer: Medicare Other | Source: Ambulatory Visit | Attending: Endocrinology | Admitting: Endocrinology

## 2016-07-10 VITALS — BP 132/80 | HR 68 | Ht 62.0 in | Wt 109.0 lb

## 2016-07-10 DIAGNOSIS — I2510 Atherosclerotic heart disease of native coronary artery without angina pectoris: Secondary | ICD-10-CM

## 2016-07-10 DIAGNOSIS — R059 Cough, unspecified: Secondary | ICD-10-CM

## 2016-07-10 DIAGNOSIS — R05 Cough: Secondary | ICD-10-CM

## 2016-07-10 NOTE — Progress Notes (Signed)
Subjective:    Patient ID: [name], female    DOB: 02-19-1925, 82 y.o.   MRN: [id number]  HPI Pt states 1 week of slight productive cough in the chest, but no assoc wheezing.  She has nasal congestion.   Past Medical History:  Diagnosis Date  . Abdominal pain   . Allergic rhinitis, cause unspecified   . Anemia B twelve deficiency   . Arrhythmia    paroxysmal atrial fibrillation  . Arthritis    "arms and hands" (06/30/2013)  . Benign neoplasm of colon   . Coronary artery disease   . Degeneration of lumbar or lumbosacral intervertebral disc   . Dizziness - giddy   . Edema   . GERD (gastroesophageal reflux disease)   . Hyperlipidemia   . Hypertension   . Iron deficiency anemia, unspecified   . Lumbago   . Pernicious anemia   . Symptomatic menopausal or female climacteric states   . Thoracic or lumbosacral neuritis or radiculitis, unspecified   . Type II diabetes mellitus ([place])     Past Surgical History:  Procedure Laterality Date  . ABDOMINAL HYSTERECTOMY  1974  . APPENDECTOMY  1974  . BACK SURGERY    . CARDIAC CATHETERIZATION    . CARPAL TUNNEL RELEASE Bilateral 1970's  . CATARACT EXTRACTION, BILATERAL Bilateral   . CHOLECYSTECTOMY    . CORONARY ANGIOPLASTY WITH STENT PLACEMENT     "1" (06/30/2013  . LUMBAR DISC SURGERY      Social History   Social History  . Marital status: Widowed    Spouse name: N/A  . Number of children: 8  . Years of education: 10th Grade   Occupational History  . Not on file.   Social History Main Topics  . Smoking status: Never Smoker  . Smokeless tobacco: Current User    Types: Snuff  . Alcohol use No  . Drug use: No  . Sexual activity: No   Other Topics Concern  . Not on file   Social History Narrative   Patient lives with daughter in a two story home.   Patient has a 10th grade education.   Patient is retired.   Patient is right handed.    Current Outpatient Prescriptions on File Prior to Visit  Medication  Sig Dispense Refill  . acetaminophen (TYLENOL) 325 MG tablet Take 325 mg by mouth every 6 (six) hours as needed (pain).    [name] amLODipine (NORVASC) 5 MG tablet Take 1 tablet (5 mg total) by mouth daily. 90 tablet 3  . apixaban (ELIQUIS) 2.5 MG TABS tablet Take 1 tablet (2.5 mg total) by mouth 2 (two) times daily. 180 tablet 3  . atorvastatin (LIPITOR) 40 MG tablet Take 1 tablet (40 mg total) by mouth daily. 90 tablet 3  . Cyanocobalamin 500 MCG/0.1ML SOLN Place 0.1 mLs (500 mcg total) into the nose once a week. 1.3 mL 0  . donepezil (ARICEPT) 10 MG tablet Take 1 tablet (10 mg total) by mouth at bedtime. 90 tablet 3  . ferrous sulfate 325 (65 FE) MG tablet Take 325 mg by mouth daily with breakfast.    . glucose blood (ONE TOUCH ULTRA TEST) test strip Use to check blood sugar 1 time per day. Dx Code: E11.9 100 each 12  . iron polysaccharides (FERREX 150) 150 MG capsule Take 150 mg by mouth daily.    . Lancets (ONETOUCH ULTRASOFT) lancets Use to check blood sugar 1 time per day. Dx Code: E11.9 100 each 12  .  metFORMIN (GLUCOPHAGE) 500 MG tablet Take 1 tablet (500 mg total) by mouth 2 (two) times daily with a meal. 180 tablet 3  . metoprolol succinate (TOPROL-XL) 50 MG 24 hr tablet TAKE 1 TABLET BY MOUTH DAILY. TAKE WITH OR IMMEDIATELY FOLLOWING A MEAL. 90 tablet 2  . nitroGLYCERIN (NITROSTAT) 0.4 MG SL tablet Place 0.4 mg under the tongue every 5 (five) minutes as needed for chest pain. Reported on 12/21/2015    . omeprazole (PRILOSEC) 40 MG capsule Take 1 capsule (40 mg total) by mouth daily. 30 capsule 11  . OVER THE COUNTER MEDICATION Place 1 patch onto the skin daily as needed (pain). Arthritis pain reliever patch    . potassium chloride (K-DUR) 10 MEQ tablet Take 1 tablet (10 mEq total) by mouth daily. 90 tablet 3   No current facility-administered medications on file prior to visit.       BP 132/80   Pulse 68   Ht 5\' 2"  (1.575 m)   Wt 109 lb (49.4 kg)   SpO2 97%   BMI 19.94 kg/m    Review of Systems Denies fever and sob.      Objective:   Physical Exam Vital signs: see vs page.   Gen: elderly, frail, no distress.  In wheelchair.   LUNGS:  Clear to auscultation.       Assessment & Plan:  Cough, new, prob due to acute bronchitis.

## 2016-07-10 NOTE — Patient Instructions (Signed)
A chest x-ray is requested for you today.  We'll let you know about the results. Robitussin syrup helps loosen the cough.   I hope you feel better soon.  If you don't feel better by next week, please call back.  Please call sooner if you get worse.

## 2016-07-17 DIAGNOSIS — Z0279 Encounter for issue of other medical certificate: Secondary | ICD-10-CM

## 2016-07-18 ENCOUNTER — Ambulatory Visit: Payer: Medicare Other

## 2016-07-23 ENCOUNTER — Ambulatory Visit
Admission: RE | Admit: 2016-07-23 | Discharge: 2016-07-23 | Disposition: A | Discharge status: Home / Self Care | Payer: Medicare Other | Source: Ambulatory Visit | Attending: Obstetrics and Gynecology | Admitting: Obstetrics and Gynecology

## 2016-07-23 DIAGNOSIS — Z1231 Encounter for screening mammogram for malignant neoplasm of breast: Secondary | ICD-10-CM | POA: Diagnosis not present

## 2016-07-24 ENCOUNTER — Telehealth: Payer: Self-pay | Admitting: Endocrinology

## 2016-07-24 NOTE — Telephone Encounter (Signed)
Pt's daughters FMLA needs to say an exact amount of time and the prob duration needs to say an amount of time as well Also the appt times needs to say how long those appts may last

## 2016-07-24 NOTE — Telephone Encounter (Signed)
See message to be advised.  

## 2016-07-24 NOTE — Telephone Encounter (Signed)
Forms resubmitted.

## 2016-07-24 NOTE — Telephone Encounter (Signed)
I amended the form yesterday

## 2016-08-12 ENCOUNTER — Ambulatory Visit (INDEPENDENT_AMBULATORY_CARE_PROVIDER_SITE_OTHER): Payer: Medicare Other | Admitting: Endocrinology

## 2016-08-12 VITALS — BP 148/64 | HR 70 | Wt 111.0 lb

## 2016-08-12 DIAGNOSIS — Z23 Encounter for immunization: Secondary | ICD-10-CM | POA: Diagnosis not present

## 2016-08-12 DIAGNOSIS — I638 Other cerebral infarction: Secondary | ICD-10-CM

## 2016-08-12 NOTE — Progress Notes (Signed)
Subjective:    Patient ID: [name], female    DOB: Aug 09, 1925, 82 y.o.   MRN: [id number]  HPI Pt states 10 days of slight pain at the left ear, and assoc nasal congestion.   no cbg record, but states cbg's are well-controlled.       Social History   Social History  . Marital status: Widowed    Spouse name: N/A  . Number of children: 8  . Years of education: 10th Grade   Occupational History  . Not on file.   Social History Main Topics  . Smoking status: Never Smoker  . Smokeless tobacco: Current User    Types: Snuff  . Alcohol use No  . Drug use: No  . Sexual activity: No   Other Topics Concern  . Not on file   Social History Narrative   Patient lives with daughter in a two story home.   Patient has a 10th grade education.   Patient is retired.   Patient is right handed.    Current Outpatient Prescriptions on File Prior to Visit  Medication Sig Dispense Refill  . acetaminophen (TYLENOL) 325 MG tablet Take 325 mg by mouth every 6 (six) hours as needed (pain).    [name] amLODipine (NORVASC) 5 MG tablet Take 1 tablet (5 mg total) by mouth daily. 90 tablet 3  . apixaban (ELIQUIS) 2.5 MG TABS tablet Take 1 tablet (2.5 mg total) by mouth 2 (two) times daily. 180 tablet 3  . atorvastatin (LIPITOR) 40 MG tablet Take 1 tablet (40 mg total) by mouth daily. 90 tablet 3  . Cyanocobalamin 500 MCG/0.1ML SOLN Place 0.1 mLs (500 mcg total) into the nose once a week. 1.3 mL 0  . donepezil (ARICEPT) 10 MG tablet Take 1 tablet (10 mg total) by mouth at bedtime. 90 tablet 3  . ferrous sulfate 325 (65 FE) MG tablet Take 325 mg by mouth daily with breakfast.    . glucose blood (ONE TOUCH ULTRA TEST) test strip Use to check blood sugar 1 time per day. Dx Code: E11.9 100 each 12  . iron polysaccharides (FERREX 150) 150 MG capsule Take 150 mg by mouth daily.    . Lancets (ONETOUCH ULTRASOFT) lancets Use to check blood sugar 1 time per day. Dx Code: E11.9 100 each 12  . metFORMIN  (GLUCOPHAGE) 500 MG tablet Take 1 tablet (500 mg total) by mouth 2 (two) times daily with a meal. 180 tablet 3  . metoprolol succinate (TOPROL-XL) 50 MG 24 hr tablet TAKE 1 TABLET BY MOUTH DAILY. TAKE WITH OR IMMEDIATELY FOLLOWING A MEAL. 90 tablet 2  . nitroGLYCERIN (NITROSTAT) 0.4 MG SL tablet Place 0.4 mg under the tongue every 5 (five) minutes as needed for chest pain. Reported on 12/21/2015    . omeprazole (PRILOSEC) 40 MG capsule Take 1 capsule (40 mg total) by mouth daily. 30 capsule 11  . OVER THE COUNTER MEDICATION Place 1 patch onto the skin daily as needed (pain). Arthritis pain reliever patch    . potassium chloride (K-DUR) 10 MEQ tablet Take 1 tablet (10 mEq total) by mouth daily. 90 tablet 3   No current facility-administered medications on file prior to visit.       Family History  Problem Relation Age of Onset  . Breast cancer Sister     "behind heart"  . Colon cancer Sister   . Colon cancer Sister     BP (!) 148/64   Pulse 70   Wt 111  lb (50.3 kg)   SpO2 96%   BMI 20.30 kg/m   Review of Systems Denies earache and fever.  She has a slight dry cough.  No sob.  No weight change.      Objective:   Physical Exam Vital signs: see vs page Gen: elderly, frail, no distress in wheelchair. head: no deformity  eyes: no periorbital swelling, no proptosis  external nose and ears are normal.  mouth: no lesion seen.   Both eac's and tm's are normal.    Lab Results  Component Value Date   HGBA1C 7.3 06/28/2016      Assessment & Plan:  URI, new. Type 2 DM: well-controlled Patient is advised the following:  Patient Instructions  Loratadine and flonase (both non-prescription) will help your congestion and runny nose.  Please come back for a follow-up appointment in 4 months.

## 2016-08-12 NOTE — Patient Instructions (Addendum)
Loratadine and flonase (both non-prescription) will help your congestion and runny nose.  Please come back for a follow-up appointment in 4 months.

## 2016-08-19 ENCOUNTER — Encounter: Payer: Self-pay | Admitting: Neurology

## 2016-08-19 ENCOUNTER — Ambulatory Visit (INDEPENDENT_AMBULATORY_CARE_PROVIDER_SITE_OTHER): Payer: Medicare Other | Admitting: Neurology

## 2016-08-19 VITALS — BP 162/62 | HR 68 | Temp 97.7°F | Ht 62.0 in | Wt 111.5 lb

## 2016-08-19 DIAGNOSIS — R404 Transient alteration of awareness: Secondary | ICD-10-CM | POA: Diagnosis not present

## 2016-08-19 DIAGNOSIS — F03A0 Unspecified dementia, mild, without behavioral disturbance, psychotic disturbance, mood disturbance, and anxiety: Secondary | ICD-10-CM

## 2016-08-19 DIAGNOSIS — I6389 Other cerebral infarction: Secondary | ICD-10-CM

## 2016-08-19 DIAGNOSIS — I638 Other cerebral infarction: Secondary | ICD-10-CM

## 2016-08-19 DIAGNOSIS — F0390 Unspecified dementia without behavioral disturbance: Secondary | ICD-10-CM | POA: Diagnosis not present

## 2016-08-19 NOTE — Patient Instructions (Signed)
1. Continue all your medications 2. Physical exercise and brain stimulation exercises are important for brain health 3. Follow-up in 6 months, call for any changes

## 2016-08-19 NOTE — Progress Notes (Signed)
NEUROLOGY FOLLOW UP OFFICE NOTE  [name] SH:[id number]  HISTORY OF PRESENT ILLNESS: I had the pleasure of seeing [name] in follow-up in the neurology clinic on 08/19/2016.  The patient was last seen 9 months ago for recurrent episodes of loss of consciousness and headaches. She is again accompanied by her 2 daughters who help supplement the history today.  Records and images were personally reviewed where available. Since her last visit, she had one syncopal episode last 04/23/16 and was brought to [facility] ER. Shortly after she got out of the bath, she complained of feeling very weak and needed to sit down. She was nauseated and vomited, then briefly slumped down for around 30 seconds. Family lowered her to the ground, and she woke up with quick return to baseline. No convulsive activity. BP medications had been adjusted the week prior, however BP was elevated in the ER. It was unclear what the etiology of the event was, her potassium was mildly low (3.3).   She denies any further headaches, no dizziness, diplopia, focal numbness/tingling. She was baseline left-sided weakness after the stroke in October 2016. She ambulates with a cane. She feels her memory is good, "I can remember everything I want to." Her daughter rates her memory as a 3.5/10. She is occasionally belligerent, "almost like trying to dress a child in the morning." She needs help with bathing. There is report of paranoia. She sleeps fairly well but wakes up at 430am. No hallucinations.   HPI: This is a pleasant 82 yo RH woman with a history of diabetes, hypertension, CAD, paroxysmal atrial fibrillation, dementia, with a 5-6 year history of recurrent episodes of loss of consciousness and headaches. Prior to hospital admission in August 2016, she had a total of 5 or 6 of these episodes. Once or twice, her daughter has seen her stare, then go out. It appeared she would just go to sleep. She had urinary incontinence at least twice  with these. No convulsive activity noted. Episodes would last 5-10 minutes, then she would come to a little confused. She had an episode in January 2015, the the next episode occurred in August 2016. She was brought to the ER on 07/11/15 for severe retro-orbital headaches, BP was noted to be 195/79. She was discharged home with improvement in headache and BP. She was brought back by family the next day for aphasia lasting 30-35 minutes. She was attempting to eat breakfast when she started "babbling" and appeared more confused. The cup she was holding was shaking and beginning to spill. This lasted a few minutes, and as she was improving, she said "why am I talking like this?" No focal weakness noted by family, she was arguing about what she wanted to wear when EMS arrived. She has no recollection of this. Per records, symptoms resolved upon EMS arrival. She was admitted for stroke workup. CBC, BMP were unremarkable except for glucose of 206. I personally reviewed MRI brain without contrast which did not show acute infarct, there was generalized atrophy and hydrocephalus ex vacuo, moderately advanced chronic microvascular disease. MRA showed intracranial atherosclerosis, no significant stenosis. Echo showed EF of 0000000, grade 1 diastolic dysfunction, normal left atrium. On her third hospital day, she had an episode while sitting on a chair, when her whole body became stiff and she was unresponsive for 1-2 minutes. She was lowered to the ground and revived within a couple of sternal rubs. Vital signs were normal. Head CT was unremarkable. On further questioning, family reported  previous episodes of transient loss of consciousness where she would suddenly collapse with loss of bladder control. Her wake and drowsy EEG was normal. She was very confused during her hospital stay, "she tried to escape," and was started on Seroquel. She was discharged home back to baseline per family. She was brought back to the ER on  07/22/15 for slurred speech and lethargy, felt to be due to Seroquel, this has since been discontinued with no further similar episodes.   Her 24-hour ambulatory EEG in October 2016 did not show any epileptiform discharges, there was occasional focal slowing over the left temporal region. She was admitted to [facility] for a right subcortical stroke on 08/20/15. She started having slurred speech on 08/18/15 and was brought to the ER the next day where she was evaluated by Neurology with note of normal neurological exam, recent TIA workup, head CT unremarkable. She was discharged home then returned the next day due to weakness, inability to walk, and fall. Family felt left side was weaker. MRI brain showed an acute stroke in the posterior right lentiform nucleus extending superiorly into the centrum semiovale. MRA showed intracranial stenosis. She had some worsening of symptoms in the hospital, and after discussion with family, it was decided to start Eliquis. Her BP medications were adjusted for permissive hypertension.  She feels her memory is pretty good. She lives with her daughter. Family started to notice memory changes several years ago, they administer her medications. They deny any hallucinations. She does not drive. She is taking Aricept 5mg  daily with no side effects. She denies any olfactory/gustatory hallucinations, deja vu, rising epigastric sensation, focal numbness/tingling/weakness, myoclonic jerks. There is a family history of seizures in her older sister, cousin, and grandson. Otherwise, she had a normal birth and early development. There is no history of febrile convulsions, CNS infections such as meningitis/encephalitis, significant traumatic brain injury, neurosurgical procedures.   PAST MEDICAL HISTORY: Past Medical History:  Diagnosis Date  . Abdominal pain   . Allergic rhinitis, cause unspecified   . Anemia B twelve deficiency   . Arrhythmia    paroxysmal atrial fibrillation  .  Arthritis    "arms and hands" (06/30/2013)  . Benign neoplasm of colon   . Coronary artery disease   . Degeneration of lumbar or lumbosacral intervertebral disc   . Dizziness - giddy   . Edema   . GERD (gastroesophageal reflux disease)   . Hyperlipidemia   . Hypertension   . Iron deficiency anemia, unspecified   . Lumbago   . Pernicious anemia   . Symptomatic menopausal or female climacteric states   . Thoracic or lumbosacral neuritis or radiculitis, unspecified   . Type II diabetes mellitus (HCC)     MEDICATIONS: Current Outpatient Prescriptions on File Prior to Visit  Medication Sig Dispense Refill  . acetaminophen (TYLENOL) 325 MG tablet Take 325 mg by mouth every 6 (six) hours as needed (pain).    [name] amLODipine (NORVASC) 5 MG tablet Take 1 tablet (5 mg total) by mouth daily. 90 tablet 3  . apixaban (ELIQUIS) 2.5 MG TABS tablet Take 1 tablet (2.5 mg total) by mouth 2 (two) times daily. 180 tablet 3  . atorvastatin (LIPITOR) 40 MG tablet Take 1 tablet (40 mg total) by mouth daily. 90 tablet 3  . Cyanocobalamin 500 MCG/0.1ML SOLN Place 0.1 mLs (500 mcg total) into the nose once a week. 1.3 mL 0  . donepezil (ARICEPT) 10 MG tablet Take 1 tablet (10 mg total) by mouth  at bedtime. 90 tablet 3  . ferrous sulfate 325 (65 FE) MG tablet Take 325 mg by mouth daily with breakfast.    . glucose blood (ONE TOUCH ULTRA TEST) test strip Use to check blood sugar 1 time per day. Dx Code: E11.9 100 each 12  . iron polysaccharides (FERREX 150) 150 MG capsule Take 150 mg by mouth daily.    . Lancets (ONETOUCH ULTRASOFT) lancets Use to check blood sugar 1 time per day. Dx Code: E11.9 100 each 12  . metFORMIN (GLUCOPHAGE) 500 MG tablet Take 1 tablet (500 mg total) by mouth 2 (two) times daily with a meal. 180 tablet 3  . metoprolol succinate (TOPROL-XL) 50 MG 24 hr tablet TAKE 1 TABLET BY MOUTH DAILY. TAKE WITH OR IMMEDIATELY FOLLOWING A MEAL. 90 tablet 2  . nitroGLYCERIN (NITROSTAT) 0.4 MG SL tablet  Place 0.4 mg under the tongue every 5 (five) minutes as needed for chest pain. Reported on 12/21/2015    . omeprazole (PRILOSEC) 40 MG capsule Take 1 capsule (40 mg total) by mouth daily. 30 capsule 11  . OVER THE COUNTER MEDICATION Place 1 patch onto the skin daily as needed (pain). Arthritis pain reliever patch    . potassium chloride (K-DUR) 10 MEQ tablet Take 1 tablet (10 mEq total) by mouth daily. 90 tablet 3   No current facility-administered medications on file prior to visit.     ALLERGIES: Allergies  Allergen Reactions  . Penicillins Swelling      . Sulfonamide Derivatives Other (See Comments)    REACTION: "like my head was full of water"  . Albumin (Human) Other (See Comments)    Doesn't remember   . Clindamycin Other (See Comments)    Doesn't remember   . Iodides Hives  . Iodinated Diagnostic Agents Hives  . Sulfa Antibiotics Hives  . Celecoxib Itching and Rash  . Erythromycin Itching and Rash  . Nitrofuran Derivatives Rash  . Nitrofurantoin Itching and Rash  . Piroxicam Other (See Comments)    unknown  . Povidone-Iodine Itching and Rash    FAMILY HISTORY: Family History  Problem Relation Age of Onset  . Breast cancer Sister     "behind heart"  . Colon cancer Sister   . Colon cancer Sister     SOCIAL HISTORY: Social History   Social History  . Marital status: Widowed    Spouse name: N/A  . Number of children: 8  . Years of education: 10th Grade   Occupational History  . Not on file.   Social History Main Topics  . Smoking status: Never Smoker  . Smokeless tobacco: Current User    Types: Snuff  . Alcohol use No  . Drug use: No  . Sexual activity: No   Other Topics Concern  . Not on file   Social History Narrative   Patient lives with daughter in a two story home.   Patient has a 10th grade education.   Patient is retired.   Patient is right handed.    REVIEW OF SYSTEMS: Constitutional: No fevers, chills, or sweats, no generalized fatigue,  change in appetite Eyes: No visual changes, double vision, eye pain Ear, nose and throat: No hearing loss, ear pain, nasal congestion, sore throat Cardiovascular: No chest pain, palpitations Respiratory:  No shortness of breath at rest or with exertion, wheezes GastrointestinaI: No nausea, vomiting, diarrhea, abdominal pain, fecal incontinence Genitourinary:  No dysuria, urinary retention or frequency Musculoskeletal:  No neck pain, +back pain Integumentary: No rash,  pruritus, skin lesions Neurological: as above Psychiatric: No depression, insomnia, anxiety Endocrine: No palpitations, fatigue, diaphoresis, mood swings, change in appetite, change in weight, increased thirst Hematologic/Lymphatic:  No anemia, purpura, petechiae. Allergic/Immunologic: no itchy/runny eyes, nasal congestion, recent allergic reactions, rashes  PHYSICAL EXAM: Vitals:   08/19/16 0836  BP: (!) 162/62  Pulse: 68  Temp: 97.7 F (36.5 C)   General: No acute distress Head:  Normocephalic/atraumatic Neck: supple, no paraspinal tenderness, full range of motion Heart:  Regular rate and rhythm Lungs:  Clear to auscultation bilaterally Back: No paraspinal tenderness Skin/Extremities: No rash, no edema Neurological Exam: alert and oriented to person, place,day/season. No aphasia or dysarthria. Fund of knowledge is appropriate.  Remote memory intact. 0/3 delayed recall. Attention and concentration are normal.    Able to name objects and repeat phrases. CDT 4/5  MMSE - Mini Mental State Exam 08/19/2016 08/02/2015  Orientation to time 2 3  Orientation to Place 5 5  Registration 3 3  Attention/ Calculation 0 0  Recall 0 0  Language- name 2 objects 2 2  Language- repeat 1 1  Language- follow 3 step command 3 2  Language- read & follow direction 1 1  Write a sentence 1 1  Copy design 0 1  Total score 18 19    Cranial nerves: Pupils equal, round, reactive to light. Extraocular movements intact with no nystagmus.  Visual fields full. Facial sensation intact. No facial asymmetry. Tongue, uvula, palate midline.  Motor: Bulk and tone normal, muscle strength 4/5 on left UE, otherwise 5/5 throughout with right shoulder pain.  Sensation to light touch intact.  No extinction to double simultaneous stimulation.  Deep tendon reflexes 2+ throughout, toes downgoing.  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  Gait not tested, sitting on wheelchair.  IMPRESSION: This is a pleasant 82 yo RH woman with a history of hypertension, diabetes, CAD, atrial fibrillation, mild to moderate dementia, who presented for evaluation of possible seizures. She has been having recurrent episodes of loss of consciousness over the past 5-6 years, admitted in August 2016 for transient aphasia, and during her hospital stay had an episode of generalized stiffening and unresponsiveness. She does have risk factors for seizures with dementia and family history of seizures. Convulsive syncope is also a consideration. She had a syncopal episode last June 2017, no seizure-like activity, with quick return to baseline. Her 24-hour EEG did not show any epileptiform discharges, there was occasional focal slowing over the left temporal region. She had a subcortical stroke in October 2016 and is now on Eliquis. No clear indication to start seizure medication at this time. Her MMSE today is 18/30 (19/30 in September 2016), continue Aricept 10mg  daily. Her daughter reports more behavioral issues. She had problems with Seroquel in the past. Consideration for Depakote in the future if behavioral changes worsen. She will follow-up in 6 months and knows to call for any changes.  Thank you for allowing me to participate in her care.  Please do not hesitate to call for any questions or concerns.  The duration of this appointment visit was 25 minutes of face-to-face time with the patient.  Greater than 50% of this time was spent in counseling,  explanation of diagnosis, planning of further management, and coordination of care.   Ellouise Newer, M.D.   CC: Dr. Loanne Drilling

## 2016-09-10 ENCOUNTER — Other Ambulatory Visit: Payer: Self-pay | Admitting: Endocrinology

## 2016-09-10 NOTE — Telephone Encounter (Signed)
Please advise if ok to refill, this medication is listed under a historical provider.

## 2016-09-16 ENCOUNTER — Telehealth: Payer: Self-pay | Admitting: Neurology

## 2016-09-16 MED ORDER — DIVALPROEX SODIUM ER 250 MG PO TB24: 250.0000 mg | ORAL_TABLET | Freq: Every day | ORAL | 3 refills | Status: DC

## 2016-09-16 NOTE — Telephone Encounter (Signed)
Left message for patient's daughter of below as she requested. Advised daughter to call if any questions or concerns. RX sent to pharmacy.

## 2016-09-16 NOTE — Telephone Encounter (Signed)
CB# [id number] [phone] Pincus Sanes PT's daughter called and said they needed a prescription called in for her mom's agitation/Dawn

## 2016-09-16 NOTE — Telephone Encounter (Signed)
Contacted patient's daughter. She states patient has became more agitated and showing a lot of emotion. She states you had discussed giving her a prescription to help with this last OV in October. Juluis Rainier- daughter states okay to leave detailed message on her work phone if we call in medication and to use [facility] on [address].

## 2016-09-16 NOTE — Telephone Encounter (Signed)
Let's start Depakote ER 250mg  qhs. Pls let daughter know to start at bedtime as this may cause sleepiness in some patients. We can increase dose further in the future if needed. Thanks

## 2016-09-17 ENCOUNTER — Telehealth: Payer: Self-pay | Admitting: Neurology

## 2016-09-17 DIAGNOSIS — Z124 Encounter for screening for malignant neoplasm of cervix: Secondary | ICD-10-CM | POA: Diagnosis not present

## 2016-09-17 DIAGNOSIS — R300 Dysuria: Secondary | ICD-10-CM | POA: Diagnosis not present

## 2016-09-17 MED ORDER — VALPROIC ACID 250 MG/5ML PO SOLN: ORAL | 6 refills | Status: DC

## 2016-09-17 NOTE — Telephone Encounter (Signed)
PT's daughter Lynn Ito called and wanted a call back before she gives her mother the medication/Dawn CB#[phone]

## 2016-09-17 NOTE — Telephone Encounter (Signed)
Contacted patient's daughter. She states she had done some research on Depakote and talked to the pharmacy of possible side effects. Daughter states mother was taking a medication before to help her relax and they think it may have been cause of seizure. Daughter wants to know your thoughts on if this medication may do same. She also states when she picked medication up it is a big tablet and its not to be broke or crushed wants to know if they decide to use medication if they can get in liquid form.

## 2016-09-17 NOTE — Telephone Encounter (Signed)
Per patient's daughter left VM of below information. Advise her to call if any questions or concerns.

## 2016-09-17 NOTE — Telephone Encounter (Signed)
Pls let her know that Depakote is actually a medication that treats seizures, so it should not be cause for concern. Ok to take the liquid form, I have sent in Rx for Valproic acid 250mg /69mL, take 82mL at night. Thanks

## 2016-09-19 ENCOUNTER — Telehealth: Payer: Self-pay | Admitting: Internal Medicine

## 2016-09-19 NOTE — Telephone Encounter (Signed)
Daughter is a current patient of Dr. Quay Burow.  She is requesting Dr. Quay Burow to take her mother on as a patient.  States currently her daughter, and niece are patients as well of Dr. Quay Burow.

## 2016-09-20 NOTE — Telephone Encounter (Signed)
Yes I will accept.  I will likely not be able to see her until January or February

## 2016-09-22 ENCOUNTER — Encounter (HOSPITAL_COMMUNITY): Payer: Self-pay | Admitting: Neurology

## 2016-09-22 ENCOUNTER — Emergency Department (HOSPITAL_COMMUNITY)
Admission: EM | Admit: 2016-09-22 | Discharge: 2016-09-22 | Disposition: A | Discharge status: Home / Self Care | Payer: Medicare Other | Attending: Emergency Medicine | Admitting: Emergency Medicine

## 2016-09-22 ENCOUNTER — Emergency Department (HOSPITAL_COMMUNITY): Payer: Medicare Other

## 2016-09-22 DIAGNOSIS — Z955 Presence of coronary angioplasty implant and graft: Secondary | ICD-10-CM | POA: Insufficient documentation

## 2016-09-22 DIAGNOSIS — I10 Essential (primary) hypertension: Secondary | ICD-10-CM | POA: Insufficient documentation

## 2016-09-22 DIAGNOSIS — Z79899 Other long term (current) drug therapy: Secondary | ICD-10-CM | POA: Diagnosis not present

## 2016-09-22 DIAGNOSIS — J40 Bronchitis, not specified as acute or chronic: Secondary | ICD-10-CM

## 2016-09-22 DIAGNOSIS — R042 Hemoptysis: Secondary | ICD-10-CM | POA: Insufficient documentation

## 2016-09-22 DIAGNOSIS — Z8673 Personal history of transient ischemic attack (TIA), and cerebral infarction without residual deficits: Secondary | ICD-10-CM | POA: Insufficient documentation

## 2016-09-22 DIAGNOSIS — Z7901 Long term (current) use of anticoagulants: Secondary | ICD-10-CM | POA: Insufficient documentation

## 2016-09-22 DIAGNOSIS — E119 Type 2 diabetes mellitus without complications: Secondary | ICD-10-CM | POA: Insufficient documentation

## 2016-09-22 DIAGNOSIS — I2510 Atherosclerotic heart disease of native coronary artery without angina pectoris: Secondary | ICD-10-CM | POA: Diagnosis not present

## 2016-09-22 DIAGNOSIS — Z7984 Long term (current) use of oral hypoglycemic drugs: Secondary | ICD-10-CM | POA: Diagnosis not present

## 2016-09-22 LAB — BASIC METABOLIC PANEL
Anion gap: 9 (ref 5–15)
BUN: 16 mg/dL (ref 6–20)
CO2: 27 mmol/L (ref 22–32)
Calcium: 9.2 mg/dL (ref 8.9–10.3)
Chloride: 104 mmol/L (ref 101–111)
Creatinine, Ser: 0.85 mg/dL (ref 0.44–1.00)
GFR calc Af Amer: >60 mL/min (ref >60)
GFR calc non Af Amer: 59 mL/min — ABNORMAL LOW (ref >60)
Glucose, Bld: 224 mg/dL — ABNORMAL HIGH (ref 65–99)
Potassium: 3.8 mmol/L (ref 3.5–5.1)
Sodium: 140 mmol/L (ref 135–145)

## 2016-09-22 LAB — CBC WITH DIFFERENTIAL/PLATELET
Basophils Absolute: 0 10*3/uL (ref 0.0–0.1)
Basophils Relative: 0 %
Eosinophils Absolute: 0.5 10*3/uL (ref 0.0–0.7)
Eosinophils Relative: 7 %
HCT: 32 % — ABNORMAL LOW (ref 36.0–46.0)
Hemoglobin: 10.3 g/dL — ABNORMAL LOW (ref 12.0–15.0)
Lymphocytes Relative: 26 %
Lymphs Abs: 1.9 10*3/uL (ref 0.7–4.0)
MCH: 28.2 pg (ref 26.0–34.0)
MCHC: 32.2 g/dL (ref 30.0–36.0)
MCV: 87.7 fL (ref 78.0–100.0)
Monocytes Absolute: 0.5 10*3/uL (ref 0.1–1.0)
Monocytes Relative: 7 %
Neutro Abs: 4.2 10*3/uL (ref 1.7–7.7)
Neutrophils Relative %: 60 %
Platelets: 248 10*3/uL (ref 150–400)
RBC: 3.65 MIL/uL — ABNORMAL LOW (ref 3.87–5.11)
RDW: 13.9 % (ref 11.5–15.5)
WBC: 7 10*3/uL (ref 4.0–10.5)

## 2016-09-22 NOTE — Discharge Instructions (Signed)
Return for any new or worse symptoms. Return for any increase in blood habits being coughed up. Chest x-ray here today negative labs normal. Make an appointment to follow-up with your regular Dr. for recheck.

## 2016-09-22 NOTE — ED Triage Notes (Addendum)
Pt is here with her daughters, reporting that she was coughing and spitting up blood this morning. Reports cough x 1 month was treated at PCP with cough syrup, OTC remedies. Pt is a x 4. Denies any pain.

## 2016-09-22 NOTE — ED Provider Notes (Signed)
[facility] DEPT Provider Note   CSN: [id number] Arrival date & time: 09/22/16  0715     History   Chief Complaint Chief Complaint  Patient presents with  . Hemoptysis    HPI [name] is a 82 y.o. female.  Patient with one-month history of cough occasionally productive. On cough medicine as per primary care doctor. This morning family members noticed some blood streaking in the phlegm. Patient states it's been there before. Patient denies any pain no shortness of breath no fevers. No abdominal pain no chest pain no rash. Patient is on the blood thinner Eliquist. Patient denies any blood in bowel movements. His appetite is been good. Eating well.      Past Medical History:  Diagnosis Date  . Abdominal pain   . Allergic rhinitis, cause unspecified   . Anemia B twelve deficiency   . Arrhythmia    paroxysmal atrial fibrillation  . Arthritis    "arms and hands" (06/30/2013)  . Benign neoplasm of colon   . Coronary artery disease   . Degeneration of lumbar or lumbosacral intervertebral disc   . Dizziness - giddy   . Edema   . GERD (gastroesophageal reflux disease)   . Hyperlipidemia   . Hypertension   . Iron deficiency anemia, unspecified   . Lumbago   . Pernicious anemia   . Symptomatic menopausal or female climacteric states   . Thoracic or lumbosacral neuritis or radiculitis, unspecified   . Type II diabetes mellitus [facility])     Patient Active Problem List   Diagnosis Date Noted  . Dysuria 06/28/2016  . Cerebral infarction ([place]) 12/27/2015  . Convulsions ([place]) 09/11/2015  . SVT (supraventricular tachycardia) ([place])   . Fall 08/21/2015  . CVA (cerebral infarction) 08/21/2015  . PAF (paroxysmal atrial fibrillation) ([place])   . Stroke (cerebrum) ([place]) 08/20/2015  . Awareness alteration, transient 08/02/2015  . Sinusitis 07/12/2015  . Aphasia   . TIA (transient ischemic attack)   . FTT (failure to thrive) in adult   . Memory loss 02/24/2015  . Syncope  10/31/2014  . Diabetes mellitus type 2, controlled ([place]) 10/31/2014  . Uncontrolled hypertension 10/31/2014  . Headache 11/26/2013  . Encounter for long-term (current) use of other medications 11/20/2011  . Menopausal state 11/20/2011  . UTI (urinary tract infection) 08/20/2011  . CAD, NATIVE VESSEL 02/14/2010  . PAROXYSMAL ATRIAL FIBRILLATION 02/14/2010  . GERD 02/14/2010  . ANEMIA-B12 DEFICIENCY 01/01/2010  . OSTEOARTHRITIS 01/01/2010  . PERSONAL HX COLONIC POLYPS 01/01/2010  . Iron deficiency anemia 12/28/2009  . DIZZINESS AND GIDDINESS 12/28/2009  . LUMBOSACRAL RADICULOPATHY 08/11/2008  . KNEE PAIN, RIGHT 02/11/2008  . Dyslipidemia 04/23/2007  . Allergic rhinitis 04/23/2007  . GALLBLADDER DISEASE 04/23/2007  . ARTHRITIS 04/23/2007  . Kenneth DISEASE, LUMBAR 04/23/2007  . DIVERTICULITIS, HX OF 04/23/2007    Past Surgical History:  Procedure Laterality Date  . ABDOMINAL HYSTERECTOMY  1974  . APPENDECTOMY  1974  . BACK SURGERY    . CARDIAC CATHETERIZATION    . CARPAL TUNNEL RELEASE Bilateral 1970's  . CATARACT EXTRACTION, BILATERAL Bilateral   . CHOLECYSTECTOMY    . CORONARY ANGIOPLASTY WITH STENT PLACEMENT     "1" (06/30/2013  . LUMBAR DISC SURGERY      OB History    No data available       Home Medications    Prior to Admission medications   Medication Sig Start Date End Date Taking? Authorizing Provider  acetaminophen (TYLENOL) 325 MG tablet Take 325 mg by  mouth every 6 (six) hours as needed (pain).   Yes Historical Provider, MD  amLODipine (NORVASC) 5 MG tablet Take 1 tablet (5 mg total) by mouth daily. 04/24/16  Yes Burnell Blanks, MD  apixaban (ELIQUIS) 2.5 MG TABS tablet Take 1 tablet (2.5 mg total) by mouth 2 (two) times daily. 07/09/16  Yes Burnell Blanks, MD  atorvastatin (LIPITOR) 40 MG tablet Take 1 tablet (40 mg total) by mouth daily. 04/24/16  Yes Burnell Blanks, MD  divalproex (DEPAKOTE ER) 250 MG 24 hr tablet Take 1 tablet (250 mg  total) by mouth at bedtime. 09/16/16  Yes Cameron Sprang, MD  donepezil (ARICEPT) 10 MG tablet Take 1 tablet (10 mg total) by mouth at bedtime. 04/12/16  Yes Renato Shin, MD  ferrous sulfate 325 (65 FE) MG tablet Take 325 mg by mouth daily with breakfast.   Yes Historical Provider, MD  glucose blood (ONE TOUCH ULTRA TEST) test strip Use to check blood sugar 1 time per day. Dx Code: E11.9 12/26/15  Yes Renato Shin, MD  Lancets [facility] ULTRASOFT) lancets Use to check blood sugar 1 time per day. Dx Code: E11.9 12/26/15  Yes Renato Shin, MD  metFORMIN (GLUCOPHAGE) 500 MG tablet Take 1 tablet (500 mg total) by mouth 2 (two) times daily with a meal. 12/29/15  Yes Renato Shin, MD  metoprolol succinate (TOPROL-XL) 50 MG 24 hr tablet TAKE 1 TABLET BY MOUTH DAILY. TAKE WITH OR IMMEDIATELY FOLLOWING A MEAL. 06/12/16  Yes Burnell Blanks, MD  nitroGLYCERIN (NITROSTAT) 0.4 MG SL tablet Place 0.4 mg under the tongue every 5 (five) minutes as needed for chest pain. Reported on 12/21/2015   Yes Historical Provider, MD  omeprazole (PRILOSEC) 40 MG capsule Take 1 capsule (40 mg total) by mouth daily. 03/07/16  Yes Renato Shin, MD  OVER THE COUNTER MEDICATION Place 1 patch onto the skin daily as needed (pain). Arthritis pain reliever patch   Yes Historical Provider, MD  potassium chloride (K-DUR) 10 MEQ tablet Take 1 tablet (10 mEq total) by mouth daily. 04/24/16  Yes Burnell Blanks, MD  Valproate Sodium (VALPROIC ACID) 250 MG/5ML SOLN Take 48mL at night 09/17/16  Yes Cameron Sprang, MD  Cyanocobalamin 500 MCG/0.1ML SOLN Place 0.1 mLs (500 mcg total) into the nose once a week. Patient not taking: Reported on 09/22/2016 06/28/16   Renato Shin, MD  [place] 150 150 MG capsule TAKE ONE CAPSULE BY MOUTH TWICE A DAY Patient not taking: Reported on 09/22/2016 09/10/16   Renato Shin, MD    Family History Family History  Problem Relation Age of Onset  . Breast cancer Sister     "behind heart"  . Colon cancer  Sister   . Colon cancer Sister     Social History Social History  Substance Use Topics  . Smoking status: Never Smoker  . Smokeless tobacco: Current User    Types: Snuff  . Alcohol use No     Allergies   Penicillins; Sulfonamide derivatives; Albumin (human); Clindamycin; Iodides; Iodinated diagnostic agents; Sulfa antibiotics; Celecoxib; Erythromycin; Nitrofuran derivatives; Nitrofurantoin; Piroxicam; and Povidone-iodine   Review of Systems Review of Systems  Constitutional: Negative for appetite change and fever.  HENT: Negative for congestion.   Eyes: Negative for redness.  Respiratory: Positive for cough. Negative for shortness of breath.   Cardiovascular: Negative for chest pain.  Gastrointestinal: Negative for abdominal pain.  Genitourinary: Negative for dysuria.  Musculoskeletal: Negative for back pain.  Skin: Negative for rash.  Neurological:  Negative for headaches.  Hematological: Does not bruise/bleed easily.  Psychiatric/Behavioral: Negative for confusion.     Physical Exam Updated Vital Signs BP 147/55   Pulse 73   Temp 97.7 F (36.5 C) (Oral)   Resp 16   SpO2 99%   Physical Exam  Constitutional: She is oriented to person, place, and time. She appears well-developed and well-nourished. No distress.  HENT:  Head: Normocephalic and atraumatic.  Mouth/Throat: Oropharynx is clear and moist.  Eyes: EOM are normal. Pupils are equal, round, and reactive to light.  Neck: Normal range of motion. Neck supple.  Cardiovascular: Normal rate, regular rhythm and normal heart sounds.   Pulmonary/Chest: Effort normal and breath sounds normal. No respiratory distress.  Abdominal: Soft. Bowel sounds are normal. There is no tenderness.  Neurological: She is alert and oriented to person, place, and time. No cranial nerve deficit or sensory deficit. She exhibits normal muscle tone. Coordination normal.  Except for mild weakness on the left side from previous stroke.    Skin: Skin is warm.  Nursing note and vitals reviewed.    ED Treatments / Results  Labs (all labs ordered are listed, but only abnormal results are displayed) Labs Reviewed  CBC WITH DIFFERENTIAL/PLATELET - Abnormal; Notable for the following:       Result Value   RBC 3.65 (*)    Hemoglobin 10.3 (*)    HCT 32.0 (*)    All other components within normal limits  BASIC METABOLIC PANEL - Abnormal; Notable for the following:    Glucose, Bld 224 (*)    GFR calc non Af Amer 59 (*)    All other components within normal limits    EKG  EKG Interpretation None       Radiology Dg Chest 2 View  Result Date: 09/22/2016 CLINICAL DATA:  Hemoptysis. EXAM: CHEST  2 VIEW COMPARISON:  07/10/2016 FINDINGS: The heart size and mediastinal contours are within normal limits. There is no evidence of pulmonary edema, consolidation, pneumothorax, nodule or pleural fluid. Stable mild spondylosis of the thoracic spine. IMPRESSION: No active cardiopulmonary disease. Electronically Signed   By: Aletta Edouard M.D.   On: 09/22/2016 08:14    Procedures Procedures (including critical care time)  Medications Ordered in ED Medications - No data to display   Initial Impression / Assessment and Plan / ED Course  I have reviewed the triage vital signs and the nursing notes.  Pertinent labs & imaging results that were available during my care of the patient were reviewed by me and considered in my medical decision making (see chart for details).  Clinical Course     Brought in by family members with the complaint of a cough phlegm and some red streaking there's been ongoing for a month. Family noted the blood this morning. Patient's appetite is been good. No complaints of any pain. Patient here nontoxic oxygen saturation 100%. No fevers. Exam normal. Chest x-ray without any acute findings. Labs without any significant abnormalities hemoglobin and hematocrit is baseline for patient. Recommend close  follow-up with primary care doctor.  Final Clinical Impressions(s) / ED Diagnoses   Final diagnoses:  Hemoptysis  Bronchitis    New Prescriptions New Prescriptions   No medications on file     Fredia Sorrow, MD 09/22/16 1009

## 2016-09-23 NOTE — Telephone Encounter (Signed)
Got patient scheduled

## 2016-10-15 NOTE — Progress Notes (Signed)
Chief Complaint  Patient presents with  . Appointment     has had 'mucus cough' for around a month.     History of Present Illness: 82 yo female with history of CAD, atrial fibrillation, HTN, HLD, syncope who is here today for cardiac follow up. She has been followed in the past by Dr. Verl Blalock. Last cath April 2011 with bare metal stent patent in LAD, moderate disease in mid Circumflex, mild disease RCA. She had not been on anti-coagulation due to advanced age and fall risk but had a stroke in October 2016 and was placed on Eliquis. She was seen in follow up in our office February 2017 by Lyda Jester, PA-C and her BP was elevated. Norvasc added to metoprolol at that time. Statin restarted after CVA.   She tells me that she feels well. No chest pain or SOB. No palpitations. NO LE edema.   Primary Care Physician: Renato Shin, MD   Past Medical History:  Diagnosis Date  . Abdominal pain   . Allergic rhinitis, cause unspecified   . Anemia B twelve deficiency   . Arrhythmia    paroxysmal atrial fibrillation  . Arthritis    "arms and hands" (06/30/2013)  . Benign neoplasm of colon   . Coronary artery disease   . Degeneration of lumbar or lumbosacral intervertebral disc   . Dizziness - giddy   . Edema   . GERD (gastroesophageal reflux disease)   . Hyperlipidemia   . Hypertension   . Iron deficiency anemia, unspecified   . Lumbago   . Pernicious anemia   . Symptomatic menopausal or female climacteric states   . Thoracic or lumbosacral neuritis or radiculitis, unspecified   . Type II diabetes mellitus ([place])     Past Surgical History:  Procedure Laterality Date  . ABDOMINAL HYSTERECTOMY  1974  . APPENDECTOMY  1974  . BACK SURGERY    . CARDIAC CATHETERIZATION    . CARPAL TUNNEL RELEASE Bilateral 1970's  . CATARACT EXTRACTION, BILATERAL Bilateral   . CHOLECYSTECTOMY    . CORONARY ANGIOPLASTY WITH STENT PLACEMENT     "1" (06/30/2013  . LUMBAR DISC SURGERY      Current  Outpatient Prescriptions  Medication Sig Dispense Refill  . acetaminophen (TYLENOL) 325 MG tablet Take 325 mg by mouth every 6 (six) hours as needed (pain).    [name] amLODipine (NORVASC) 5 MG tablet Take 1 tablet (5 mg total) by mouth daily. 90 tablet 3  . apixaban (ELIQUIS) 2.5 MG TABS tablet Take 1 tablet (2.5 mg total) by mouth 2 (two) times daily. 180 tablet 3  . atorvastatin (LIPITOR) 40 MG tablet Take 1 tablet (40 mg total) by mouth daily. 90 tablet 3  . Cyanocobalamin 500 MCG/0.1ML SOLN Place 0.1 mLs (500 mcg total) into the nose once a week. 1.3 mL 0  . divalproex (DEPAKOTE ER) 250 MG 24 hr tablet Take 1 tablet (250 mg total) by mouth at bedtime. 30 tablet 3  . donepezil (ARICEPT) 10 MG tablet Take 1 tablet (10 mg total) by mouth at bedtime. 90 tablet 3  . FERREX 150 150 MG capsule TAKE ONE CAPSULE BY MOUTH TWICE A DAY 60 capsule 3  . ferrous sulfate 325 (65 FE) MG tablet Take 325 mg by mouth daily with breakfast.    . glucose blood (ONE TOUCH ULTRA TEST) test strip Use to check blood sugar 1 time per day. Dx Code: E11.9 100 each 12  . Lancets (ONETOUCH ULTRASOFT) lancets Use to  check blood sugar 1 time per day. Dx Code: E11.9 100 each 12  . metFORMIN (GLUCOPHAGE) 500 MG tablet Take 1 tablet (500 mg total) by mouth 2 (two) times daily with a meal. 180 tablet 3  . metoprolol succinate (TOPROL-XL) 50 MG 24 hr tablet TAKE 1 TABLET BY MOUTH DAILY. TAKE WITH OR IMMEDIATELY FOLLOWING A MEAL. 90 tablet 3  . nitroGLYCERIN (NITROSTAT) 0.4 MG SL tablet Place 0.4 mg under the tongue every 5 (five) minutes as needed for chest pain. Reported on 12/21/2015    . omeprazole (PRILOSEC) 40 MG capsule Take 1 capsule (40 mg total) by mouth daily. 30 capsule 11  . OVER THE COUNTER MEDICATION Place 1 patch onto the skin daily as needed (pain). Arthritis pain reliever patch    . potassium chloride (K-DUR) 10 MEQ tablet Take 1 tablet (10 mEq total) by mouth daily. 90 tablet 3  . Valproate Sodium (VALPROIC ACID) 250  MG/5ML SOLN Take 75mL at night 150 mL 6   No current facility-administered medications for this visit.     Allergies  Allergen Reactions  . Penicillins Swelling    Hy proceed with Cephalosporin use.  . Sulfonamide Derivatives Other (See Comments)    REACTION: "like my head was full of water"  . Albumin (Human) Other (See Comments)    Doesn't remember   . Clindamycin Other (See Comments)    Doesn't remember   . Iodides Hives  . Iodinated Diagnostic Agents Hives  . Sulfa Antibiotics Hives  . Celecoxib Itching and Rash  . Erythromycin Itching and Rash  . Nitrofuran Derivatives Rash  . Nitrofurantoin Itching and Rash  . Piroxicam Other (See Comments)    unknown  . Povidone-Iodine Itching and Rash    Social History   Social History  . Marital status: Widowed    Spouse name: N/A  . Number of children: 8  . Years of education: 10th Grade   Occupational History  . Not on file.   Social History Main Topics  . Smoking status: Never Smoker  . Smokeless tobacco: Current User    Types: Snuff  . Alcohol use No  . Drug use: No  . Sexual activity: No   Other Topics Concern  . Not on file   Social History Narrative   Patient lives with daughter in a two story home.   Patient has a 10th grade education.   Patient is retired.   Patient is right handed.    Family History  Problem Relation Age of Onset  . Breast cancer Sister     "behind heart"  . Colon cancer Sister   . Colon cancer Sister     Review of Systems:  As stated in the HPI and otherwise negative.   BP 132/72   Pulse 76   Ht 5\' 2"  (1.575 m)   Wt 112 lb (50.8 kg)   BMI 20.49 kg/m   Physical Examination: General: Well developed, well nourished, NAD  HEENT: OP clear, mucus membranes moist  SKIN: warm, dry. No rashes. Neuro: No focal deficits  Musculoskeletal: Muscle strength 5/5 all ext  Psychiatric: Mood and affect normal  Neck: No JVD, no carotid bruits, no thyromegaly, no lymphadenopathy.    Lungs:Clear bilaterally, no wheezes, rhonci, crackles Cardiovascular: Regular rate and rhythm. No murmurs, gallops or rubs. Abdomen:Soft. Bowel sounds present. Non-tender.  Extremities: No lower extremity edema. Pulses are 2 + in the bilateral DP/PT.  EKG:  EKG is not  ordered today. The ekg ordered today demonstrates  Recent Labs: 04/12/2016: ALT 14; TSH 3.14 09/22/2016: BUN 16; Creatinine, Ser 0.85; Hemoglobin 10.3; Platelets 248; Potassium 3.8; Sodium 140   Lipid Panel    Component Value Date/Time   CHOL 148 04/12/2016 0827   TRIG 64.0 04/12/2016 0827   TRIG 80 09/04/2006 0837   HDL 60.30 04/12/2016 0827   CHOLHDL 2 04/12/2016 0827   VLDL 12.8 04/12/2016 0827   LDLCALC 75 04/12/2016 0827   LDLDIRECT 155.3 08/11/2008 1122     Wt Readings from Last 3 Encounters:  10/16/16 112 lb (50.8 kg)  08/19/16 111 lb 8 oz (50.6 kg)  08/12/16 111 lb (50.3 kg)     Other studies Reviewed: Additional studies/ records that were reviewed today include: . Review of the above records demonstrates:    Assessment and Plan:   1. CAD: No recent chest pain to suggest unstable angina. Will not plan stress testing given advanced age. Continue beta blocker, statin. No ASA since she is on Eliquis.     2. Paroxysmal atrial fibrillation: Maintaining sinus rhythm. She is on Eliquis for anti-coagulation and metoprolol for rate control.    3. HTN: BP is controlled.      4. Hyperlipdemia: Continue statin. LDL at goal.     Current medicines are reviewed at length with the patient today.  The patient does not have concerns regarding medicines.  The following changes have been made:  no change  Labs/ tests ordered today include:  No orders of the defined types were placed in this encounter.   Disposition:   FU with me in 12  months  Signed, Lauree Chandler, MD 10/16/2016 8:38 AM    [facility] [place], [place], [place]  [address] Phone: [phone]; Fax:  [phone]

## 2016-10-16 ENCOUNTER — Ambulatory Visit (INDEPENDENT_AMBULATORY_CARE_PROVIDER_SITE_OTHER): Payer: Medicare Other | Admitting: Cardiovascular Disease

## 2016-10-16 ENCOUNTER — Encounter: Payer: Self-pay | Admitting: Cardiovascular Disease

## 2016-10-16 VITALS — BP 132/72 | HR 76 | Ht 62.0 in | Wt 112.0 lb

## 2016-10-16 DIAGNOSIS — I10 Essential (primary) hypertension: Secondary | ICD-10-CM

## 2016-10-16 DIAGNOSIS — I2510 Atherosclerotic heart disease of native coronary artery without angina pectoris: Secondary | ICD-10-CM | POA: Diagnosis not present

## 2016-10-16 DIAGNOSIS — I638 Other cerebral infarction: Secondary | ICD-10-CM | POA: Diagnosis not present

## 2016-10-16 DIAGNOSIS — E7800 Pure hypercholesterolemia, unspecified: Secondary | ICD-10-CM

## 2016-10-16 DIAGNOSIS — I480 Paroxysmal atrial fibrillation: Secondary | ICD-10-CM

## 2016-10-16 MED ORDER — AMLODIPINE BESYLATE 5 MG PO TABS: 5.0000 mg | ORAL_TABLET | Freq: Every day | ORAL | 3 refills | Status: DC

## 2016-10-16 MED ORDER — METOPROLOL SUCCINATE ER 50 MG PO TB24: ORAL_TABLET | ORAL | 3 refills | Status: DC

## 2016-10-16 MED ORDER — ATORVASTATIN CALCIUM 40 MG PO TABS: 40.0000 mg | ORAL_TABLET | Freq: Every day | ORAL | 3 refills | Status: DC

## 2016-10-16 MED ORDER — APIXABAN 2.5 MG PO TABS: 2.5000 mg | ORAL_TABLET | Freq: Two times a day (BID) | ORAL | 3 refills | Status: DC

## 2016-10-16 NOTE — Patient Instructions (Signed)
Medication Instructions:  Your physician recommends that you continue on your current medications as directed. Please refer to the Current Medication list given to you today.   Labwork: none  Testing/Procedures: none  Follow-Up: Your physician recommends that you schedule a follow-up appointment in: 12 months. Please call our office in September to schedule this appointment.     Any Other Special Instructions Will Be Listed Below (If Applicable).     If you need a refill on your cardiac medications before your next appointment, please call your pharmacy.   

## 2016-10-31 ENCOUNTER — Telehealth: Payer: Self-pay | Admitting: Cardiovascular Disease

## 2016-10-31 NOTE — Telephone Encounter (Signed)
Daughter advised that I'm very sorry about her sister and she needed to contact patient's PCP for request of anxiety medication.

## 2016-10-31 NOTE — Telephone Encounter (Signed)
°  New Prob   Daughter calling requesting a prescription for pts anxiety. States pts other daughter recently was placed on hospice and pt is not taking this well. Please call.

## 2016-11-25 NOTE — Progress Notes (Signed)
Subjective:    Patient ID: [name], female    DOB: October 31, 1925, 83 y.o.   MRN: [id number]  HPI The patient is here for follow up.  Diabetes: She has been seeing Dr Loanne Drilling but would prefer to just see one person.  She is taking her medication daily as prescribed. She is compliant with a diabetic diet. She is not exercising regularly and leads a sedentary life. She monitors her sugars and they have been running 130's.   Hypertension, PAF, CAD: She is taking her medication daily. She is compliant with a low sodium diet.  She denies chest pain, palpitations, shortness of breath and regular headaches. She is not exercising regularly.  She does monitor her blood pressure at home - 170/180 in the morning - normal in the afternoon.  Her family wonders if pain is contributing because she has chronic back pain.   The pain is bad in the morning.    Allergies: She has allergy symptoms including a runny nose, sneezing, occasional ear pain. She has not been taking anything for it.    Hyperlipidemia: She is taking her medication daily. She is compliant with a low fat/cholesterol diet.    H/o CVA:  Her left leg swells a little since the stroke. [name] She has residual left arm and leg weakness since the stroke.    Dementia:  She is following with neurology and is taking aricept.   GERD:  She is taking her medication daily as prescribed.  She denies any GERD symptoms and feels her GERD is well controlled.    Abdominal pain:  She has intermittent pain in her suprapubic pain. She has seen urology for this in the past and they recommended taking azo for pain.    Back pain:  She has chronic lower back pain.  She has radiating pain in her legs.  She feels her pain started after the stroke and thinks the stroke caused the pain.  Her family feels the pain is from arthritis.  The pain is worse first thing in the morning and gets a little better as the day goes on.  She takes aleve once daily.  She has not  tried tylenol.   GERD:  She is taking her medication daily as prescribed.  She denies any GERD symptoms and feels her GERD is well controlled.    Medications and allergies reviewed with patient and updated if appropriate.  Patient Active Problem List   Diagnosis Date Noted  . Cerebral infarction ([place]) 12/27/2015  . Convulsions ([place]) 09/11/2015  . SVT (supraventricular tachycardia) ([place])   . Fall 08/21/2015  . PAF (paroxysmal atrial fibrillation) ([place])   . Stroke (cerebrum) ([place]) 08/20/2015  . Awareness alteration, transient 08/02/2015  . Sinusitis 07/12/2015  . Aphasia   . TIA (transient ischemic attack)   . FTT (failure to thrive) in adult   . Memory loss 02/24/2015  . Syncope 10/31/2014  . Diabetes mellitus type 2, controlled ([place]) 10/31/2014  . Uncontrolled hypertension 10/31/2014  . Headache 11/26/2013  . Encounter for long-term (current) use of other medications 11/20/2011  . CAD, NATIVE VESSEL 02/14/2010  . GERD 02/14/2010  . ANEMIA-B12 DEFICIENCY 01/01/2010  . OSTEOARTHRITIS 01/01/2010  . PERSONAL HX COLONIC POLYPS 01/01/2010  . Iron deficiency anemia 12/28/2009  . DIZZINESS AND GIDDINESS 12/28/2009  . LUMBOSACRAL RADICULOPATHY 08/11/2008  . KNEE PAIN, RIGHT 02/11/2008  . Dyslipidemia 04/23/2007  . Allergic rhinitis 04/23/2007  . GALLBLADDER DISEASE 04/23/2007  . [place] DISEASE, LUMBAR 04/23/2007  .  DIVERTICULITIS, HX OF 04/23/2007    Current Outpatient Prescriptions on File Prior to Visit  Medication Sig Dispense Refill  . amLODipine (NORVASC) 5 MG tablet Take 1 tablet (5 mg total) by mouth daily. 90 tablet 3  . apixaban (ELIQUIS) 2.5 MG TABS tablet Take 1 tablet (2.5 mg total) by mouth 2 (two) times daily. 180 tablet 3  . atorvastatin (LIPITOR) 40 MG tablet Take 1 tablet (40 mg total) by mouth daily. 90 tablet 3  . donepezil (ARICEPT) 10 MG tablet Take 1 tablet (10 mg total) by mouth at bedtime. 90 tablet 3  . ferrous sulfate 325 (65 FE) MG tablet Take 325 mg by  mouth daily with breakfast.    . glucose blood (ONE TOUCH ULTRA TEST) test strip Use to check blood sugar 1 time per day. Dx Code: E11.9 100 each 12  . Lancets (ONETOUCH ULTRASOFT) lancets Use to check blood sugar 1 time per day. Dx Code: E11.9 100 each 12  . metFORMIN (GLUCOPHAGE) 500 MG tablet Take 1 tablet (500 mg total) by mouth 2 (two) times daily with a meal. 180 tablet 3  . metoprolol succinate (TOPROL-XL) 50 MG 24 hr tablet TAKE 1 TABLET BY MOUTH DAILY. TAKE WITH OR IMMEDIATELY FOLLOWING A MEAL. 90 tablet 3  . nitroGLYCERIN (NITROSTAT) 0.4 MG SL tablet Place 0.4 mg under the tongue every 5 (five) minutes as needed for chest pain. Reported on 12/21/2015    . omeprazole (PRILOSEC) 40 MG capsule Take 1 capsule (40 mg total) by mouth daily. 30 capsule 11  . OVER THE COUNTER MEDICATION Place 1 patch onto the skin daily as needed (pain). Arthritis pain reliever patch    . potassium chloride (K-DUR) 10 MEQ tablet Take 1 tablet (10 mEq total) by mouth daily. 90 tablet 3   No current facility-administered medications on file prior to visit.     Past Medical History:  Diagnosis Date  . Abdominal pain   . Allergic rhinitis, cause unspecified   . Anemia B twelve deficiency   . Arrhythmia    paroxysmal atrial fibrillation  . Arthritis    "arms and hands" (06/30/2013)  . Benign neoplasm of colon   . Coronary artery disease   . Degeneration of lumbar or lumbosacral intervertebral disc   . Dizziness - giddy   . Edema   . GERD (gastroesophageal reflux disease)   . Hyperlipidemia   . Hypertension   . Iron deficiency anemia, unspecified   . Lumbago   . Pernicious anemia   . Symptomatic menopausal or female climacteric states   . Thoracic or lumbosacral neuritis or radiculitis, unspecified   . Type II diabetes mellitus ([place])     Past Surgical History:  Procedure Laterality Date  . ABDOMINAL HYSTERECTOMY  1974  . APPENDECTOMY  1974  . BACK SURGERY    . CARDIAC CATHETERIZATION    . CARPAL  TUNNEL RELEASE Bilateral 1970's  . CATARACT EXTRACTION, BILATERAL Bilateral   . CHOLECYSTECTOMY    . CORONARY ANGIOPLASTY WITH STENT PLACEMENT     "1" (06/30/2013  . LUMBAR DISC SURGERY      Social History   Social History  . Marital status: Widowed    Spouse name: N/A  . Number of children: 8  . Years of education: 10th Grade   Social History Main Topics  . Smoking status: Never Smoker  . Smokeless tobacco: Current User    Types: Snuff  . Alcohol use No  . Drug use: No  . Sexual activity:  No   Other Topics Concern  . None   Social History Narrative   Patient lives with daughter in a two story home.   Patient has a 10th grade education.   Patient is retired.   Patient is right handed.    Family History  Problem Relation Age of Onset  . Breast cancer Sister     "behind heart"  . Colon cancer Sister   . Colon cancer Sister     Review of Systems  Constitutional: Negative for appetite change (good).  HENT: Positive for rhinorrhea and sneezing.   Respiratory: Positive for wheezing (occ, mild). Negative for cough and shortness of breath.   Cardiovascular: Positive for leg swelling (left leg only). Negative for chest pain and palpitations.  Gastrointestinal: Positive for abdominal pain (intermittent - chronic). Negative for constipation and diarrhea.  Genitourinary: Negative for dysuria and hematuria.  Musculoskeletal: Positive for arthralgias and back pain.  Neurological: Negative for dizziness, light-headedness and headaches.  Psychiatric/Behavioral: Negative for dysphoric mood and sleep disturbance. The patient is not nervous/anxious.        Objective:   Vitals:   11/26/16 1007 11/26/16 1114  BP: (!) 182/84 (!) 160/66  Pulse: 71   Resp: 16   Temp: 98.1 F (36.7 C)    Wt Readings from Last 3 Encounters:  11/26/16 113 lb (51.3 kg)  10/16/16 112 lb (50.8 kg)  08/19/16 111 lb 8 oz (50.6 kg)   Body mass index is 20.67 kg/m.   Physical Exam      Constitutional: Appears well-developed and well-nourished. No distress.  HENT:  Head: Normocephalic and atraumatic.  Neck: Neck supple. No tracheal deviation present. No thyromegaly present.  No cervical lymphadenopathy Cardiovascular: Normal rate, regular rhythm and normal heart sounds.   No murmur heard. No carotid bruit .  No edema Pulmonary/Chest: Effort normal and breath sounds normal. No respiratory distress. No has no wheezes. No rales.  Abdomen: soft, non- distended, mild suprapubic tenderness - chronic in nature Skin: Skin is warm and dry. Not diaphoretic.  Psychiatric: Normal mood and affect. Behavior is normal.      Assessment & Plan:    See Problem List for Assessment and Plan of chronic medical problems.

## 2016-11-26 ENCOUNTER — Encounter: Payer: Self-pay | Admitting: Internal Medicine

## 2016-11-26 ENCOUNTER — Ambulatory Visit (INDEPENDENT_AMBULATORY_CARE_PROVIDER_SITE_OTHER): Payer: Medicare Other | Admitting: Internal Medicine

## 2016-11-26 VITALS — BP 160/66 | HR 71 | Temp 98.1°F | Resp 16 | Ht 62.0 in | Wt 113.0 lb

## 2016-11-26 DIAGNOSIS — I639 Cerebral infarction, unspecified: Secondary | ICD-10-CM

## 2016-11-26 DIAGNOSIS — G8929 Other chronic pain: Secondary | ICD-10-CM

## 2016-11-26 DIAGNOSIS — M5440 Lumbago with sciatica, unspecified side: Secondary | ICD-10-CM

## 2016-11-26 DIAGNOSIS — R413 Other amnesia: Secondary | ICD-10-CM

## 2016-11-26 DIAGNOSIS — I480 Paroxysmal atrial fibrillation: Secondary | ICD-10-CM

## 2016-11-26 DIAGNOSIS — K219 Gastro-esophageal reflux disease without esophagitis: Secondary | ICD-10-CM

## 2016-11-26 DIAGNOSIS — I10 Essential (primary) hypertension: Secondary | ICD-10-CM

## 2016-11-26 DIAGNOSIS — M5137 Other intervertebral disc degeneration, lumbosacral region: Secondary | ICD-10-CM

## 2016-11-26 DIAGNOSIS — E1122 Type 2 diabetes mellitus with diabetic chronic kidney disease: Secondary | ICD-10-CM | POA: Diagnosis not present

## 2016-11-26 DIAGNOSIS — N181 Chronic kidney disease, stage 1: Secondary | ICD-10-CM

## 2016-11-26 DIAGNOSIS — J309 Allergic rhinitis, unspecified: Secondary | ICD-10-CM

## 2016-11-26 NOTE — Assessment & Plan Note (Signed)
Lab Results  Component Value Date   HGBA1C 7.3 06/28/2016   Sugars have been controlled Will follow with me due to difficulty of patient getting to multiple doctor appointments Continue current dose of metformin

## 2016-11-26 NOTE — Assessment & Plan Note (Signed)
GERD controlled Continue daily medication  

## 2016-11-26 NOTE — Progress Notes (Signed)
Pre visit review using our clinic review tool, if applicable. No additional management support is needed unless otherwise documented below in the visit note. 

## 2016-11-26 NOTE — Assessment & Plan Note (Signed)
BP Readings from Last 3 Encounters:  11/26/16 (!) 160/66  10/16/16 132/72  09/22/16 136/61   ? Controlled Per family high in AM and controlled rest of day First visit here - will monitor for now continue current medications

## 2016-11-26 NOTE — Assessment & Plan Note (Signed)
Advised her to take claritin or mucinex

## 2016-11-26 NOTE — Assessment & Plan Note (Signed)
Chronic pain Taking aleve once daily Advised to start tylenol twice daily Discussed possible referral for further evaluation but she and family deferred today

## 2016-11-26 NOTE — Assessment & Plan Note (Addendum)
Residual left sided weakness On eliquis Follows with neurology

## 2016-11-26 NOTE — Assessment & Plan Note (Signed)
Following with cardiology On elquis

## 2016-11-26 NOTE — Patient Instructions (Addendum)
   All other Health Maintenance issues reviewed.   All recommended immunizations and age-appropriate screenings are up-to-date or discussed.  No immunizations administered today.   Medications reviewed and updated.  No changes recommended at this time.    Please followup in 6 months   

## 2016-11-26 NOTE — Assessment & Plan Note (Signed)
On aricept Following with Dr Delice Lesch

## 2016-12-13 ENCOUNTER — Ambulatory Visit: Payer: Medicare Other | Admitting: Endocrinology

## 2016-12-13 ENCOUNTER — Telehealth: Payer: Self-pay | Admitting: Endocrinology

## 2016-12-13 NOTE — Telephone Encounter (Signed)
Patient no showed today's appt. Please advise on how to follow up. °A. No follow up necessary. °B. Follow up urgent. Contact patient immediately. °C. Follow up necessary. Contact patient and schedule visit in ___ days. °D. Follow up advised. Contact patient and schedule visit in ____weeks. ° °

## 2016-12-16 NOTE — Telephone Encounter (Signed)
Follow up advised. Contact patient and schedule visit in 4 weeks 

## 2016-12-18 ENCOUNTER — Other Ambulatory Visit: Payer: Self-pay | Admitting: Endocrinology

## 2016-12-19 NOTE — Telephone Encounter (Signed)
LVM for Pt to reschedule appointment.

## 2016-12-20 ENCOUNTER — Telehealth: Payer: Self-pay | Admitting: Endocrinology

## 2016-12-20 NOTE — Telephone Encounter (Signed)
Patient needs refill on: metFORMIN (GLUCOPHAGE) 500 MG tablet  Sent to: [facility] ([address]), [place] [phone] (Phone) [phone] (Fax)

## 2016-12-23 NOTE — Telephone Encounter (Signed)
This was sent in by Endo on 12/19/16

## 2017-01-08 ENCOUNTER — Other Ambulatory Visit: Payer: Self-pay

## 2017-01-08 MED ORDER — GLUCOSE BLOOD VI STRP: ORAL_STRIP | 12 refills | Status: DC

## 2017-01-10 ENCOUNTER — Other Ambulatory Visit: Payer: Self-pay | Admitting: *Deleted

## 2017-01-10 MED ORDER — ONETOUCH ULTRASOFT LANCETS MISC: 1 refills | Status: DC

## 2017-01-10 NOTE — Telephone Encounter (Signed)
Daughter called mom needing refills on her one touch lancets. Verified pharmacy inform will send to [facility]...Johny Chess

## 2017-01-13 ENCOUNTER — Other Ambulatory Visit: Payer: Self-pay

## 2017-01-13 MED ORDER — GLUCOSE BLOOD VI STRP
ORAL_STRIP | 12 refills | Status: DC
Start: 2017-01-13 — End: 2018-04-16

## 2017-01-15 ENCOUNTER — Other Ambulatory Visit: Payer: Self-pay | Admitting: Emergency Medicine

## 2017-01-15 MED ORDER — ONETOUCH DELICA LANCETS 33G MISC: 1 refills | Status: AC

## 2017-02-11 ENCOUNTER — Ambulatory Visit (INDEPENDENT_AMBULATORY_CARE_PROVIDER_SITE_OTHER): Payer: Medicare Other | Admitting: Internal Medicine

## 2017-02-11 ENCOUNTER — Other Ambulatory Visit (INDEPENDENT_AMBULATORY_CARE_PROVIDER_SITE_OTHER): Payer: Medicare Other

## 2017-02-11 ENCOUNTER — Encounter: Payer: Self-pay | Admitting: Internal Medicine

## 2017-02-11 VITALS — BP 160/78 | HR 73 | Temp 97.5°F | Ht 62.0 in | Wt 111.1 lb

## 2017-02-11 DIAGNOSIS — I639 Cerebral infarction, unspecified: Secondary | ICD-10-CM | POA: Diagnosis not present

## 2017-02-11 DIAGNOSIS — N181 Chronic kidney disease, stage 1: Secondary | ICD-10-CM

## 2017-02-11 DIAGNOSIS — D509 Iron deficiency anemia, unspecified: Secondary | ICD-10-CM | POA: Diagnosis not present

## 2017-02-11 DIAGNOSIS — R1030 Lower abdominal pain, unspecified: Secondary | ICD-10-CM

## 2017-02-11 DIAGNOSIS — E1122 Type 2 diabetes mellitus with diabetic chronic kidney disease: Secondary | ICD-10-CM

## 2017-02-11 DIAGNOSIS — I10 Essential (primary) hypertension: Secondary | ICD-10-CM

## 2017-02-11 LAB — URINALYSIS, ROUTINE W REFLEX MICROSCOPIC
Bilirubin Urine: NEGATIVE
Ketones, ur: NEGATIVE
Nitrite: NEGATIVE
RBC / HPF: NONE SEEN (ref 0–?)
Specific Gravity, Urine: 1.01 (ref 1.000–1.030)
Total Protein, Urine: 100 — AB
Urine Glucose: NEGATIVE
Urobilinogen, UA: 0.2 (ref 0.0–1.0)
pH: 6 (ref 5.0–8.0)

## 2017-02-11 LAB — FERRITIN: Ferritin: 70.9 ng/mL (ref 10.0–291.0)

## 2017-02-11 LAB — CBC WITH DIFFERENTIAL/PLATELET
Basophils Absolute: 0.1 10*3/uL (ref 0.0–0.1)
Basophils Relative: 1.4 % (ref 0.0–3.0)
Eosinophils Absolute: 0.6 10*3/uL (ref 0.0–0.7)
Eosinophils Relative: 6.4 % — ABNORMAL HIGH (ref 0.0–5.0)
HCT: 34.4 % — ABNORMAL LOW (ref 36.0–46.0)
Hemoglobin: 10.9 g/dL — ABNORMAL LOW (ref 12.0–15.0)
Lymphocytes Relative: 40.9 % (ref 12.0–46.0)
Lymphs Abs: 3.9 10*3/uL (ref 0.7–4.0)
MCHC: 31.8 g/dL (ref 30.0–36.0)
MCV: 87.8 fl (ref 78.0–100.0)
Monocytes Absolute: 0.9 10*3/uL (ref 0.1–1.0)
Monocytes Relative: 9.2 % (ref 3.0–12.0)
Neutro Abs: 4 10*3/uL (ref 1.4–7.7)
Neutrophils Relative %: 42.1 % — ABNORMAL LOW (ref 43.0–77.0)
Platelets: 301 10*3/uL (ref 150.0–400.0)
RBC: 3.92 Mil/uL (ref 3.87–5.11)
RDW: 16.3 % — ABNORMAL HIGH (ref 11.5–15.5)
WBC: 9.4 10*3/uL (ref 4.0–10.5)

## 2017-02-11 LAB — COMPREHENSIVE METABOLIC PANEL
ALT: 12 U/L (ref 0–35)
AST: 24 U/L (ref 0–37)
Albumin: 4.5 g/dL (ref 3.5–5.2)
Alkaline Phosphatase: 79 U/L (ref 39–117)
BUN: 18 mg/dL (ref 6–23)
CO2: 24 mEq/L (ref 19–32)
Calcium: 9.9 mg/dL (ref 8.4–10.5)
Chloride: 105 mEq/L (ref 96–112)
Creatinine, Ser: 0.76 mg/dL (ref 0.40–1.20)
GFR: 91.9 mL/min (ref >60.00)
Glucose, Bld: 134 mg/dL — ABNORMAL HIGH (ref 70–99)
Potassium: 3.9 mEq/L (ref 3.5–5.1)
Sodium: 141 mEq/L (ref 135–145)
Total Bilirubin: 0.5 mg/dL (ref 0.2–1.2)
Total Protein: 8.1 g/dL (ref 6.0–8.3)

## 2017-02-11 LAB — HEMOGLOBIN A1C: Hgb A1c MFr Bld: 7.4 % — ABNORMAL HIGH (ref 4.6–6.5)

## 2017-02-11 LAB — IRON: Iron: 34 ug/dL — ABNORMAL LOW (ref 42–145)

## 2017-02-11 NOTE — Progress Notes (Signed)
Subjective:    Patient ID: [name], female    DOB: 03-04-1925, 83 y.o.   MRN: [id number]  HPI She is here for an acute visit.   Abdominal pain:  She is having lower abdominal pain and pain in her right lower abdomen.  The pain is intermittent.  She denies dysuria, urine odor, change in color of the urine, and blood in the urine.  She denies urinary frequency. She denies changes in her bowel habits.  She states the pain has not been there for long, but is not able to tell me know long it has been there.  She has history of lower abdominal pain and has seen urology in the past - she was advised to take AZO, but she states she did not tolerate it.  She denies this pain being the same as that pain.  She is concerned about a UTI. She wonders if it is related to back pain.    She has back pain as well.  She has had surgery in her back.    Medications and allergies reviewed with patient and updated if appropriate.  Patient Active Problem List   Diagnosis Date Noted  . Cerebral infarction ([place]) 12/27/2015  . Convulsions ([place]) 09/11/2015  . SVT (supraventricular tachycardia) ([place])   . Fall 08/21/2015  . PAF (paroxysmal atrial fibrillation) ([place])   . Stroke (cerebrum) ([place]) 08/20/2015  . Awareness alteration, transient 08/02/2015  . Sinusitis 07/12/2015  . Aphasia   . TIA (transient ischemic attack)   . FTT (failure to thrive) in adult   . Memory loss 02/24/2015  . Syncope 10/31/2014  . Diabetes mellitus type 2, controlled ([place]) 10/31/2014  . Uncontrolled hypertension 10/31/2014  . Headache 11/26/2013  . Encounter for long-term (current) use of other medications 11/20/2011  . CAD, NATIVE VESSEL 02/14/2010  . GERD 02/14/2010  . ANEMIA-B12 DEFICIENCY 01/01/2010  . OSTEOARTHRITIS 01/01/2010  . PERSONAL HX COLONIC POLYPS 01/01/2010  . Iron deficiency anemia 12/28/2009  . DIZZINESS AND GIDDINESS 12/28/2009  . Chronic lower back pain 08/11/2008  . KNEE PAIN, RIGHT 02/11/2008  .  Dyslipidemia 04/23/2007  . Allergic rhinitis 04/23/2007  . GALLBLADDER DISEASE 04/23/2007  . DIVERTICULITIS, HX OF 04/23/2007    Current Outpatient Prescriptions on File Prior to Visit  Medication Sig Dispense Refill  . amLODipine (NORVASC) 5 MG tablet Take 1 tablet (5 mg total) by mouth daily. 90 tablet 3  . apixaban (ELIQUIS) 2.5 MG TABS tablet Take 1 tablet (2.5 mg total) by mouth 2 (two) times daily. 180 tablet 3  . atorvastatin (LIPITOR) 40 MG tablet Take 1 tablet (40 mg total) by mouth daily. 90 tablet 3  . Cyanocobalamin (VITAMIN B-12 PO) Take by mouth daily.    [name] donepezil (ARICEPT) 10 MG tablet Take 1 tablet (10 mg total) by mouth at bedtime. 90 tablet 3  . ferrous sulfate 325 (65 FE) MG tablet Take 325 mg by mouth daily with breakfast.    . glucose blood (ONE TOUCH ULTRA TEST) test strip Use to check blood sugar 1 time per day. Dx Code: E11.9 100 each 12  . metFORMIN (GLUCOPHAGE) 500 MG tablet TAKE ONE TABLET BY MOUTH TWICE DAILY WITH MEALS 180 tablet 0  . metoprolol succinate (TOPROL-XL) 50 MG 24 hr tablet TAKE 1 TABLET BY MOUTH DAILY. TAKE WITH OR IMMEDIATELY FOLLOWING A MEAL. 90 tablet 3  . nitroGLYCERIN (NITROSTAT) 0.4 MG SL tablet Place 0.4 mg under the tongue every 5 (five) minutes as needed for chest  pain. Reported on 12/21/2015    . omeprazole (PRILOSEC) 40 MG capsule Take 1 capsule (40 mg total) by mouth daily. 30 capsule 11  . ONETOUCH DELICA LANCETS 22Q MISC Use 1 lancet to check glucose once daily 100 each 1  . OVER THE COUNTER MEDICATION Place 1 patch onto the skin daily as needed (pain). Arthritis pain reliever patch    . potassium chloride (K-DUR) 10 MEQ tablet Take 1 tablet (10 mEq total) by mouth daily. 90 tablet 3   No current facility-administered medications on file prior to visit.     Past Medical History:  Diagnosis Date  . Abdominal pain   . Allergic rhinitis, cause unspecified   . Anemia B twelve deficiency   . Arrhythmia    paroxysmal atrial  fibrillation  . Arthritis    "arms and hands" (06/30/2013)  . Benign neoplasm of colon   . Coronary artery disease   . Degeneration of lumbar or lumbosacral intervertebral disc   . Dizziness - giddy   . Edema   . GERD (gastroesophageal reflux disease)   . Hyperlipidemia   . Hypertension   . Iron deficiency anemia, unspecified   . Lumbago   . Pernicious anemia   . Symptomatic menopausal or female climacteric states   . Thoracic or lumbosacral neuritis or radiculitis, unspecified   . Type II diabetes mellitus ([place])     Past Surgical History:  Procedure Laterality Date  . ABDOMINAL HYSTERECTOMY  1974  . APPENDECTOMY  1974  . BACK SURGERY    . CARDIAC CATHETERIZATION    . CARPAL TUNNEL RELEASE Bilateral 1970's  . CATARACT EXTRACTION, BILATERAL Bilateral   . CHOLECYSTECTOMY    . CORONARY ANGIOPLASTY WITH STENT PLACEMENT     "1" (06/30/2013  . LUMBAR DISC SURGERY      Social History   Social History  . Marital status: Widowed    Spouse name: N/A  . Number of children: 8  . Years of education: 10th Grade   Social History Main Topics  . Smoking status: Never Smoker  . Smokeless tobacco: Current User    Types: Snuff  . Alcohol use No  . Drug use: No  . Sexual activity: No   Other Topics Concern  . Not on file   Social History Narrative   Patient lives with daughter in a two story home.   Patient has a 10th grade education.   Patient is retired.   Patient is right handed.    Family History  Problem Relation Age of Onset  . Breast cancer Sister     "behind heart"  . Colon cancer Sister   . Colon cancer Sister     Review of Systems  Constitutional: Negative for chills and fever.  Gastrointestinal: Positive for abdominal pain and constipation (occ, mild). Negative for blood in stool, diarrhea and nausea.  Genitourinary: Negative for dysuria, frequency and hematuria.       Objective:   Vitals:   02/11/17 1139  BP: (!) 160/78  Pulse: 73  Temp: 97.5 F  (36.4 C)   Filed Weights   02/11/17 1139  Weight: 111 lb 1.9 oz (50.4 kg)   Body mass index is 20.32 kg/m.  Wt Readings from Last 3 Encounters:  02/11/17 111 lb 1.9 oz (50.4 kg)  11/26/16 113 lb (51.3 kg)  10/16/16 112 lb (50.8 kg)     Physical Exam  Constitutional: She appears well-developed and well-nourished. No distress.  Abdominal: Soft. She exhibits no distension and no  mass. There is tenderness (suprapubic region and periumbilical region). There is no rebound and no guarding.  Musculoskeletal: She exhibits no edema.  Skin: Skin is warm and dry. She is not diaphoretic.  Psychiatric: She has a normal mood and affect. Her behavior is normal.          Assessment & Plan:   See Problem List for Assessment and Plan of chronic medical problems.

## 2017-02-11 NOTE — Patient Instructions (Addendum)
  Test(s) ordered today. Your results will be released to [place] (or called to you) after review, usually within 72hours after test completion. If any changes need to be made, you will be notified at that same time.  Try AZO for your abdominal pain.   Medications reviewed and updated.  No changes recommended at this time.    Please followup in 6 months

## 2017-02-11 NOTE — Assessment & Plan Note (Signed)
BP high here today - family and patient states it is high int the morning and good the rest of the day - they feel her BP is controlled Continue to monitor - may need to split amlodipine to two doses or change to nighttime cmp

## 2017-02-11 NOTE — Assessment & Plan Note (Signed)
Check a1c Continue metformin for now

## 2017-02-11 NOTE — Assessment & Plan Note (Signed)
Cbc, iron, ferritin 

## 2017-02-11 NOTE — Assessment & Plan Note (Signed)
Lower abdominal pain - ? Same as pain she has had in the past She is tender on exam Check Urine tests to rule out an infection Check cmp, cbc Has seen urology in the past - advised AZO, which she is not taking - can retry May need to consider imagineg

## 2017-02-12 LAB — URINE CULTURE: Organism ID, Bacteria: NO GROWTH

## 2017-02-14 ENCOUNTER — Telehealth: Payer: Self-pay | Admitting: Cardiovascular Disease

## 2017-02-14 NOTE — Telephone Encounter (Signed)
New message     Pt has a semi large bruise on her inner thigh around the groin area about 4inches in diameter, not sure how she received it , and Dr Angelena Form said to notify of any bruising

## 2017-02-14 NOTE — Telephone Encounter (Signed)
I spoke with Enid Derry and she gives the pt her bath and noticed a bruise on the pt's leg either Monday or Tuesday of this week. She is unsure how the pt got this bruise but feels the pt may have hit her leg on walker. The pt does not have any other bruises on her body. I advised to continue with observation at this time.  If the pt develops enlargement of this bruise or new bruises then they should contact the office.  Enid Derry agreed with plan.

## 2017-02-25 ENCOUNTER — Emergency Department (HOSPITAL_COMMUNITY): Payer: Medicare Other

## 2017-02-25 ENCOUNTER — Encounter (HOSPITAL_COMMUNITY): Payer: Self-pay

## 2017-02-25 ENCOUNTER — Emergency Department (HOSPITAL_COMMUNITY)
Admission: EM | Admit: 2017-02-25 | Discharge: 2017-02-25 | Disposition: A | Discharge status: Home / Self Care | Payer: Medicare Other | Attending: Emergency Medicine | Admitting: Emergency Medicine

## 2017-02-25 DIAGNOSIS — R300 Dysuria: Secondary | ICD-10-CM | POA: Diagnosis present

## 2017-02-25 DIAGNOSIS — R1011 Right upper quadrant pain: Secondary | ICD-10-CM | POA: Diagnosis not present

## 2017-02-25 DIAGNOSIS — R531 Weakness: Secondary | ICD-10-CM | POA: Diagnosis not present

## 2017-02-25 DIAGNOSIS — R109 Unspecified abdominal pain: Secondary | ICD-10-CM | POA: Diagnosis not present

## 2017-02-25 DIAGNOSIS — R404 Transient alteration of awareness: Secondary | ICD-10-CM | POA: Diagnosis not present

## 2017-02-25 DIAGNOSIS — I10 Essential (primary) hypertension: Secondary | ICD-10-CM | POA: Insufficient documentation

## 2017-02-25 DIAGNOSIS — N390 Urinary tract infection, site not specified: Secondary | ICD-10-CM | POA: Diagnosis not present

## 2017-02-25 DIAGNOSIS — Z7984 Long term (current) use of oral hypoglycemic drugs: Secondary | ICD-10-CM | POA: Diagnosis not present

## 2017-02-25 DIAGNOSIS — E119 Type 2 diabetes mellitus without complications: Secondary | ICD-10-CM | POA: Diagnosis not present

## 2017-02-25 DIAGNOSIS — Z955 Presence of coronary angioplasty implant and graft: Secondary | ICD-10-CM | POA: Insufficient documentation

## 2017-02-25 DIAGNOSIS — Z79899 Other long term (current) drug therapy: Secondary | ICD-10-CM | POA: Insufficient documentation

## 2017-02-25 DIAGNOSIS — I2510 Atherosclerotic heart disease of native coronary artery without angina pectoris: Secondary | ICD-10-CM | POA: Insufficient documentation

## 2017-02-25 DIAGNOSIS — Z7901 Long term (current) use of anticoagulants: Secondary | ICD-10-CM | POA: Diagnosis not present

## 2017-02-25 LAB — COMPREHENSIVE METABOLIC PANEL
ALT: 14 U/L (ref 14–54)
AST: 31 U/L (ref 15–41)
Albumin: 3.4 g/dL — ABNORMAL LOW (ref 3.5–5.0)
Alkaline Phosphatase: 62 U/L (ref 38–126)
Anion gap: 8 (ref 5–15)
BUN: 17 mg/dL (ref 6–20)
CO2: 25 mmol/L (ref 22–32)
Calcium: 9 mg/dL (ref 8.9–10.3)
Chloride: 105 mmol/L (ref 101–111)
Creatinine, Ser: 0.75 mg/dL (ref 0.44–1.00)
GFR calc Af Amer: >60 mL/min (ref >60)
GFR calc non Af Amer: >60 mL/min (ref >60)
Glucose, Bld: 135 mg/dL — ABNORMAL HIGH (ref 65–99)
Potassium: 3.6 mmol/L (ref 3.5–5.1)
Sodium: 138 mmol/L (ref 135–145)
Total Bilirubin: 0.8 mg/dL (ref 0.3–1.2)
Total Protein: 6.5 g/dL (ref 6.5–8.1)

## 2017-02-25 LAB — CBC WITH DIFFERENTIAL/PLATELET
Basophils Absolute: 0 10*3/uL (ref 0.0–0.1)
Basophils Relative: 0 %
Eosinophils Absolute: 0.2 10*3/uL (ref 0.0–0.7)
Eosinophils Relative: 3 %
HCT: 28.8 % — ABNORMAL LOW (ref 36.0–46.0)
Hemoglobin: 9.5 g/dL — ABNORMAL LOW (ref 12.0–15.0)
Lymphocytes Relative: 27 %
Lymphs Abs: 1.6 10*3/uL (ref 0.7–4.0)
MCH: 28.1 pg (ref 26.0–34.0)
MCHC: 33 g/dL (ref 30.0–36.0)
MCV: 85.2 fL (ref 78.0–100.0)
Monocytes Absolute: 0.3 10*3/uL (ref 0.1–1.0)
Monocytes Relative: 5 %
Neutro Abs: 3.8 10*3/uL (ref 1.7–7.7)
Neutrophils Relative %: 65 %
Platelets: 206 10*3/uL (ref 150–400)
RBC: 3.38 MIL/uL — ABNORMAL LOW (ref 3.87–5.11)
RDW: 15.4 % (ref 11.5–15.5)
WBC: 6 10*3/uL (ref 4.0–10.5)

## 2017-02-25 LAB — URINALYSIS, ROUTINE W REFLEX MICROSCOPIC
Bilirubin Urine: NEGATIVE
Glucose, UA: NEGATIVE mg/dL
Ketones, ur: NEGATIVE mg/dL
Nitrite: NEGATIVE
Protein, ur: 100 mg/dL — AB
Specific Gravity, Urine: 1.014 (ref 1.005–1.030)
pH: 6 (ref 5.0–8.0)

## 2017-02-25 LAB — I-STAT CG4 LACTIC ACID, ED: Lactic Acid, Venous: 1.15 mmol/L (ref 0.5–1.9)

## 2017-02-25 MED ORDER — ONDANSETRON 8 MG PO TBDP: 8.0000 mg | ORAL_TABLET | Freq: Three times a day (TID) | ORAL | 0 refills | Status: DC | PRN

## 2017-02-25 MED ORDER — MORPHINE SULFATE (PF) 4 MG/ML IV SOLN
4.0000 mg | Freq: Once | INTRAVENOUS | Status: AC
  Administered 2017-02-25: 4 mg via INTRAVENOUS
  Filled 2017-02-25: qty 1

## 2017-02-25 MED ORDER — CEPHALEXIN 500 MG PO CAPS: 500.0000 mg | ORAL_CAPSULE | Freq: Three times a day (TID) | ORAL | 0 refills | Status: DC

## 2017-02-25 MED ORDER — ONDANSETRON HCL 4 MG/2ML IJ SOLN
4.0000 mg | Freq: Once | INTRAMUSCULAR | Status: AC
  Administered 2017-02-25: 4 mg via INTRAVENOUS
  Filled 2017-02-25: qty 2

## 2017-02-25 MED ORDER — CEPHALEXIN 250 MG PO CAPS
500.0000 mg | ORAL_CAPSULE | Freq: Once | ORAL | Status: AC
  Administered 2017-02-25: 500 mg via ORAL
  Filled 2017-02-25: qty 2

## 2017-02-25 NOTE — ED Notes (Signed)
Pt assisted with bedpan, pt clean and dry.  

## 2017-02-25 NOTE — ED Notes (Signed)
ED Provider at bedside. 

## 2017-02-25 NOTE — ED Triage Notes (Signed)
Per [facility], Pt is coming from home with complaint of generalized weakness that has been present since she woke up this morning. Pt was unable to fully describe. Denies no SOB, N/V/D, Chest pain, or pain reported. Lungs were clear and family reported some Dyspnea while resting last night. Pt alert and oriented x2 at baseline per family. Hx of Dementia and Diabetes.   Vitals per EMS: 158/74, 67 HR, 18 RR, 99% on RA. CBG 210  20 Gauge in L [facility]

## 2017-02-27 LAB — URINE CULTURE: Culture: 40000 — AB

## 2017-02-28 ENCOUNTER — Ambulatory Visit: Payer: Medicare Other | Admitting: Neurology

## 2017-02-28 ENCOUNTER — Telehealth: Payer: Self-pay | Admitting: *Deleted

## 2017-02-28 NOTE — Telephone Encounter (Signed)
Post ED Visit - Positive Culture Follow-up  Culture report reviewed by antimicrobial stewardship pharmacist:  []  Elenor Quinones, Pharm.D. []  Heide Guile, Pharm.D., BCPS AQ-ID []  Parks Neptune, Pharm.D., BCPS []  Alycia Rossetti, Pharm.D., BCPS []  Kathleen, Pharm.D., BCPS, AAHIVP []  Legrand Como, Pharm.D., BCPS, AAHIVP []  Salome Arnt, PharmD, BCPS []  Dimitri Ped, PharmD, BCPS []  Vincenza Hews, PharmD, BCPS Eliezer Mccoy, PA-C  Positive urine culture Treated with Cephalexin, spoke with caregiver who states patient does not have any UTI symptoms, advised to continue Cephalexin until dose is finished per Eliezer Mccoy, PA-C  [place], [place] 02/28/2017, 10:24 AM

## 2017-02-28 NOTE — Progress Notes (Signed)
ED Antimicrobial Stewardship Positive Culture Follow Up   [name] is an 83 y.o. female who presented to [facility] on 02/25/2017 with a chief complaint of  Chief Complaint  Patient presents with  . Weakness  . Dysuria    Recent Results (from the past 720 hour(s))  Urine Culture     Status: None   Collection Time: 02/11/17 12:28 PM  Result Value Ref Range Status   Organism ID, Bacteria NO GROWTH  Final  Urine culture     Status: Abnormal   Collection Time: 02/25/17 11:58 AM  Result Value Ref Range Status   Specimen Description Urine  Final   Special Requests NONE  Final   Culture 40,000 COLONIES/mL PSEUDOMONAS AERUGINOSA (A)  Final   Report Status 02/27/2017 FINAL  Final   Organism ID, Bacteria PSEUDOMONAS AERUGINOSA (A)  Final      Susceptibility   Pseudomonas aeruginosa - MIC*    CEFTAZIDIME <=1 SENSITIVE Sensitive     CIPROFLOXACIN <=0.25 SENSITIVE Sensitive     GENTAMICIN <=1 SENSITIVE Sensitive     IMIPENEM 1 SENSITIVE Sensitive     PIP/TAZO <=4 SENSITIVE Sensitive     CEFEPIME <=1 SENSITIVE Sensitive     * 40,000 COLONIES/mL PSEUDOMONAS AERUGINOSA    [x]  Treated with cephalexin, organism resistant to prescribed antimicrobial   New antibiotic prescription: Call patient to check on symptoms of UTI (dysuria, urinary frequency or urgency). If yes, treat with Ciprofloxacin 500 mg PO BID x 3 days. Stop cephalexin.   ED Provider: Eliezer Mccoy, PA-C  Marylou Flesher, PharmD Candidate 02/28/2017, 9:51 AM

## 2017-03-03 NOTE — ED Provider Notes (Signed)
[facility] DEPT Provider Note   CSN: [id number] Arrival date & time: 02/25/17  4174     History   Chief Complaint Chief Complaint  Patient presents with  . Weakness  . Dysuria   Level V caveat: Dementia  HPI [name] is a 83 y.o. female.  The history is provided by the patient and medical records.   Patient presents to the emergency department from home with complaints of generalized weakness.  No reports of fevers or chills.  No vomiting or diarrhea.  Patient denies symptoms at this time.  She is oriented 2.  There is report that she may been short of breath last night but she denies all shortness of breath at this time.  She is unable to describe any symptoms she had last night for me.  No urinary symptoms.    Past Medical History:  Diagnosis Date  . Abdominal pain   . Allergic rhinitis, cause unspecified   . Anemia B twelve deficiency   . Arrhythmia    paroxysmal atrial fibrillation  . Arthritis    "arms and hands" (06/30/2013)  . Benign neoplasm of colon   . Coronary artery disease   . Degeneration of lumbar or lumbosacral intervertebral disc   . Dizziness - giddy   . Edema   . GERD (gastroesophageal reflux disease)   . Hyperlipidemia   . Hypertension   . Iron deficiency anemia, unspecified   . Lumbago   . Pernicious anemia   . Symptomatic menopausal or female climacteric states   . Thoracic or lumbosacral neuritis or radiculitis, unspecified   . Type II diabetes mellitus [facility])     Patient Active Problem List   Diagnosis Date Noted  . Cerebral infarction ([place]) 12/27/2015  . Convulsions ([place]) 09/11/2015  . SVT (supraventricular tachycardia) ([place])   . Fall 08/21/2015  . PAF (paroxysmal atrial fibrillation) ([place])   . Stroke (cerebrum) ([place]) 08/20/2015  . Awareness alteration, transient 08/02/2015  . Aphasia   . TIA (transient ischemic attack)   . FTT (failure to thrive) in adult   . Memory loss 02/24/2015  . Syncope 10/31/2014  . Diabetes  mellitus type 2, controlled ([place]) 10/31/2014  . Uncontrolled hypertension 10/31/2014  . Headache 11/26/2013  . CAD, NATIVE VESSEL 02/14/2010  . GERD 02/14/2010  . ANEMIA-B12 DEFICIENCY 01/01/2010  . OSTEOARTHRITIS 01/01/2010  . PERSONAL HX COLONIC POLYPS 01/01/2010  . Iron deficiency anemia 12/28/2009  . DIZZINESS AND GIDDINESS 12/28/2009  . Lower abdominal pain 09/13/2009  . Chronic lower back pain 08/11/2008  . KNEE PAIN, RIGHT 02/11/2008  . Dyslipidemia 04/23/2007  . Allergic rhinitis 04/23/2007  . GALLBLADDER DISEASE 04/23/2007  . DIVERTICULITIS, HX OF 04/23/2007    Past Surgical History:  Procedure Laterality Date  . ABDOMINAL HYSTERECTOMY  1974  . APPENDECTOMY  1974  . BACK SURGERY    . CARDIAC CATHETERIZATION    . CARPAL TUNNEL RELEASE Bilateral 1970's  . CATARACT EXTRACTION, BILATERAL Bilateral   . CHOLECYSTECTOMY    . CORONARY ANGIOPLASTY WITH STENT PLACEMENT     "1" (06/30/2013  . LUMBAR DISC SURGERY      OB History    No data available       Home Medications    Prior to Admission medications   Medication Sig Start Date End Date Taking? Authorizing Provider  amLODipine (NORVASC) 5 MG tablet Take 1 tablet (5 mg total) by mouth daily. 10/16/16  Yes Burnell Blanks, MD  apixaban (ELIQUIS) 2.5 MG TABS tablet Take 1 tablet (  2.5 mg total) by mouth 2 (two) times daily. 10/16/16  Yes Burnell Blanks, MD  atorvastatin (LIPITOR) 40 MG tablet Take 1 tablet (40 mg total) by mouth daily. 10/16/16  Yes Burnell Blanks, MD  Cyanocobalamin (VITAMIN B-12 PO) Take by mouth daily.   Yes Historical Provider, MD  donepezil (ARICEPT) 10 MG tablet Take 1 tablet (10 mg total) by mouth at bedtime. 04/12/16  Yes Renato Shin, MD  ferrous sulfate 325 (65 FE) MG tablet Take 325 mg by mouth daily with breakfast.   Yes Historical Provider, MD  glucose blood (ONE TOUCH ULTRA TEST) test strip Use to check blood sugar 1 time per day. Dx Code: E11.9 01/13/17  Yes Renato Shin,  MD  metFORMIN (GLUCOPHAGE) 500 MG tablet TAKE ONE TABLET BY MOUTH TWICE DAILY WITH MEALS 12/19/16  Yes Renato Shin, MD  metoprolol succinate (TOPROL-XL) 50 MG 24 hr tablet TAKE 1 TABLET BY MOUTH DAILY. TAKE WITH OR IMMEDIATELY FOLLOWING A MEAL. 10/16/16  Yes Burnell Blanks, MD  nitroGLYCERIN (NITROSTAT) 0.4 MG SL tablet Place 0.4 mg under the tongue every 5 (five) minutes as needed for chest pain. Reported on 12/21/2015   Yes Historical Provider, MD  omeprazole (PRILOSEC) 40 MG capsule Take 1 capsule (40 mg total) by mouth daily. 03/07/16  Yes Renato Shin, MD  [facility] DELICA LANCETS 69S MISC Use 1 lancet to check glucose once daily 01/15/17  Yes Binnie Rail, MD  OVER THE COUNTER MEDICATION Place 1 patch onto the skin daily as needed (pain). Arthritis pain reliever patch   Yes Historical Provider, MD  potassium chloride (K-DUR) 10 MEQ tablet Take 1 tablet (10 mEq total) by mouth daily. 04/24/16  Yes Burnell Blanks, MD  cephALEXin (KEFLEX) 500 MG capsule Take 1 capsule (500 mg total) by mouth 3 (three) times daily. 02/25/17   Jola Schmidt, MD  ondansetron (ZOFRAN ODT) 8 MG disintegrating tablet Take 1 tablet (8 mg total) by mouth every 8 (eight) hours as needed for nausea or vomiting. 02/25/17   Jola Schmidt, MD    Family History Family History  Problem Relation Age of Onset  . Breast cancer Sister     "behind heart"  . Colon cancer Sister   . Colon cancer Sister     Social History Social History  Substance Use Topics  . Smoking status: Never Smoker  . Smokeless tobacco: Current User    Types: Snuff  . Alcohol use No     Allergies   Clindamycin; Iodides; Iodinated diagnostic agents; Penicillins; Sulfa antibiotics; Sulfonamide derivatives; Albumin (human); Celecoxib; Erythromycin; Nitrofuran derivatives; Nitrofurantoin; Piroxicam; and Povidone-iodine   Review of Systems Review of Systems  Unable to perform ROS: Mental status change     Physical Exam Updated Vital  Signs BP (!) 178/80   Pulse 64   Temp 98.2 F (36.8 C) (Oral)   Resp 15   SpO2 98%   Physical Exam  Constitutional: She appears well-developed and well-nourished. No distress.  HENT:  Head: Normocephalic and atraumatic.  Eyes: EOM are normal.  Neck: Normal range of motion.  Cardiovascular: Normal rate, regular rhythm and normal heart sounds.   Pulmonary/Chest: Effort normal and breath sounds normal.  Abdominal: Soft. She exhibits no distension. There is no tenderness.  Musculoskeletal: Normal range of motion.  Neurological: She is alert.  Oriented 2  Skin: Skin is warm and dry.  Psychiatric: She has a normal mood and affect. Judgment normal.  Nursing note and vitals reviewed.    ED  Treatments / Results  Labs (all labs ordered are listed, but only abnormal results are displayed) Labs Reviewed  URINE CULTURE - Abnormal; Notable for the following:       Result Value             All other components within normal limits  COMPREHENSIVE METABOLIC PANEL - Abnormal; Notable for the following:    Glucose, Bld 135 (*)    Albumin 3.4 (*)    All other components within normal limits  CBC WITH DIFFERENTIAL/PLATELET - Abnormal; Notable for the following:    RBC 3.38 (*)    Hemoglobin 9.5 (*)    HCT 28.8 (*)    All other components within normal limits  URINALYSIS, ROUTINE W REFLEX MICROSCOPIC - Abnormal; Notable for the following:    Hgb urine dipstick SMALL (*)    Protein, ur 100 (*)    Leukocytes, UA SMALL (*)    Bacteria, UA FEW (*)    Squamous Epithelial / LPF 0-5 (*)    All other components within normal limits  I-STAT CG4 LACTIC ACID, ED    EKG  EKG Interpretation  Date/Time:  Tuesday February 25 2017 08:06:11 EDT Ventricular Rate:  75 PR Interval:    QRS Duration: 78 QT Interval:  398 QTC Calculation: 445 R Axis:   33 Text Interpretation:  Sinus rhythm Atrial premature complex Borderline prolonged PR interval Probable anteroseptal infarct, old No significant  change was found Confirmed by Zerek Litsey  MD, Lennette Bihari ([id number]) on 02/25/2017 1:04:55 PM       Radiology No results found.  Procedures Procedures (including critical care time)  Medications Ordered in ED Medications  morphine 4 MG/ML injection 4 mg (4 mg Intravenous Given 02/25/17 1036)  ondansetron (ZOFRAN) injection 4 mg (4 mg Intravenous Given 02/25/17 1036)  cephALEXin (KEFLEX) capsule 500 mg (500 mg Oral Given 02/25/17 1233)  ondansetron (ZOFRAN) injection 4 mg (4 mg Intravenous Given 02/25/17 1320)     Initial Impression / Assessment and Plan / ED Course  I have reviewed the triage vital signs and the nursing notes.  Pertinent labs & imaging results that were available during my care of the patient were reviewed by me and considered in my medical decision making (see chart for details).     Patient will be discharged home with a prescription for Keflex for possible urinary tract infection. Overall well-appearing.  primary care follow-up.  Final Clinical Impressions(s) / ED Diagnoses   Final diagnoses:  Acute abdominal pain  Acute weakness  Acute UTI    New Prescriptions Discharge Medication List as of 02/25/2017  1:12 PM    START taking these medications   Details  cephALEXin (KEFLEX) 500 MG capsule Take 1 capsule (500 mg total) by mouth 3 (three) times daily., Starting Tue 02/25/2017, Print    ondansetron (ZOFRAN ODT) 8 MG disintegrating tablet Take 1 tablet (8 mg total) by mouth every 8 (eight) hours as needed for nausea or vomiting., Starting Tue 02/25/2017, Print         Jola Schmidt, MD 03/03/17 0126

## 2017-03-17 ENCOUNTER — Encounter: Payer: Self-pay | Admitting: Neurology

## 2017-03-17 ENCOUNTER — Ambulatory Visit (INDEPENDENT_AMBULATORY_CARE_PROVIDER_SITE_OTHER): Payer: Medicare Other | Admitting: Neurology

## 2017-03-17 ENCOUNTER — Other Ambulatory Visit: Payer: Self-pay | Admitting: Endocrinology

## 2017-03-17 VITALS — BP 126/68 | HR 76 | Temp 98.4°F | Ht 60.0 in | Wt 120.0 lb

## 2017-03-17 DIAGNOSIS — F03B18 Unspecified dementia, moderate, with other behavioral disturbance: Secondary | ICD-10-CM

## 2017-03-17 DIAGNOSIS — I639 Cerebral infarction, unspecified: Secondary | ICD-10-CM | POA: Diagnosis not present

## 2017-03-17 DIAGNOSIS — F0391 Unspecified dementia with behavioral disturbance: Secondary | ICD-10-CM

## 2017-03-17 MED ORDER — DIVALPROEX SODIUM ER 250 MG PO TB24: 250.0000 mg | ORAL_TABLET | Freq: Every day | ORAL | 6 refills | Status: DC

## 2017-03-17 MED ORDER — DONEPEZIL HCL 10 MG PO TABS: 10.0000 mg | ORAL_TABLET | Freq: Every day | ORAL | 3 refills | Status: DC

## 2017-03-17 NOTE — Patient Instructions (Signed)
1. Restart Depakote ER 250mg : take 1 tablet daily. After a month, if still a lot of agitation, we can increase to twice a day, call our office to update [place] 2. Continue Aricept 10mg  daily 3. Discuss abdominal pain with Dr. Quay Burow 4. Continue control of blood pressure, cholesterol, as well as physical exercise and brain stimulation exercises for brain health 5. Follow-up in 1 year, call for any changes

## 2017-03-17 NOTE — Progress Notes (Signed)
NEUROLOGY FOLLOW UP OFFICE NOTE  [name] [id number]  HISTORY OF PRESENT ILLNESS: I had the pleasure of seeing [name] in follow-up in the neurology clinic on 03/17/2017.  The patient was last seen 7 months ago for recurrent episodes of loss of consciousness and headaches. She is again accompanied by her daughter who help supplement the history today. They deny any further syncopal episodes since June 2017. Her main concern today is her stomach, she has been to the ER for this and her daughter wonders about an endoscopy. She feels her memory is pretty good, her daughter reports things are stable. She is taking Aricept 10mg  daily without side effects. She needs help with dressing and bathing due to her left-sided weakness. Her daughter called our office in November 2017 to report increased agitation and emotionality. She was given a prescription for Depakote ER 250mg  qhs. Her daughter thought it was to help with sleep and states it did not help with sleep and stopped it.  Sometimes she feels lightheaded and extremely off-balance but has not had any falls. Mood is good. She denies any headaches, diplopia, focal numbness/tingling. No hallucinations or significant personality changes.   HPI: This is a pleasant 83 yo RH woman with a history of diabetes, hypertension, CAD, paroxysmal atrial fibrillation, dementia, with a 5-6 year history of recurrent episodes of loss of consciousness and headaches. Prior to hospital admission in August 2016, she had a total of 5 or 6 of these episodes. Once or twice, her daughter has seen her stare, then go out. It appeared she would just go to sleep. She had urinary incontinence at least twice with these. No convulsive activity noted. Episodes would last 5-10 minutes, then she would come to a little confused. She had an episode in January 2015, the the next episode occurred in August 2016. She was brought to the ER on 07/11/15 for severe retro-orbital  headaches, BP was noted to be 195/79. She was discharged home with improvement in headache and BP. She was brought back by family the next day for aphasia lasting 30-35 minutes. She was attempting to eat breakfast when she started "babbling" and appeared more confused. The cup she was holding was shaking and beginning to spill. This lasted a few minutes, and as she was improving, she said "why am I talking like this?" No focal weakness noted by family, she was arguing about what she wanted to wear when EMS arrived. She has no recollection of this. Per records, symptoms resolved upon EMS arrival. She was admitted for stroke workup. CBC, BMP were unremarkable except for glucose of 206. I personally reviewed MRI brain without contrast which did not show acute infarct, there was generalized atrophy and hydrocephalus ex vacuo, moderately advanced chronic microvascular disease. MRA showed intracranial atherosclerosis, no significant stenosis. Echo showed EF of 21-30%, grade 1 diastolic dysfunction, normal left atrium. On her third hospital day, she had an episode while sitting on a chair, when her whole body became stiff and she was unresponsive for 1-2 minutes. She was lowered to the ground and revived within a couple of sternal rubs. Vital signs were normal. Head CT was unremarkable. On further questioning, family reported previous episodes of transient loss of consciousness where she would suddenly collapse with loss of bladder control. Her wake and drowsy EEG was normal. She was very confused during her hospital stay, "she tried to escape," and was started on Seroquel. She was discharged home back to baseline per family. She was brought  back to the ER on 07/22/15 for slurred speech and lethargy, felt to be due to Seroquel, this has since been discontinued with no further similar episodes.   Her 24-hour ambulatory EEG in October 2016 did not show any epileptiform discharges, there was occasional focal slowing over the  left temporal region. She was admitted to [facility] for a right subcortical stroke on 08/20/15. She started having slurred speech on 08/18/15 and was brought to the ER the next day where she was evaluated by Neurology with note of normal neurological exam, recent TIA workup, head CT unremarkable. She was discharged home then returned the next day due to weakness, inability to walk, and fall. Family felt left side was weaker. MRI brain showed an acute stroke in the posterior right lentiform nucleus extending superiorly into the centrum semiovale. MRA showed intracranial stenosis. She had some worsening of symptoms in the hospital, and after discussion with family, it was decided to start Eliquis. Her BP medications were adjusted for permissive hypertension.  She feels her memory is pretty good. She lives with her daughter. Family started to notice memory changes several years ago, they administer her medications. They deny any hallucinations. She does not drive. She is taking Aricept 5mg  daily with no side effects. She denies any olfactory/gustatory hallucinations, deja vu, rising epigastric sensation, focal numbness/tingling/weakness, myoclonic jerks. There is a family history of seizures in her older sister, cousin, and grandson. Otherwise, she had a normal birth and early development. There is no history of febrile convulsions, CNS infections such as meningitis/encephalitis, significant traumatic brain injury, neurosurgical procedures.   PAST MEDICAL HISTORY: Past Medical History:  Diagnosis Date  . Abdominal pain   . Allergic rhinitis, cause unspecified   . Anemia B twelve deficiency   . Arrhythmia    paroxysmal atrial fibrillation  . Arthritis    "arms and hands" (06/30/2013)  . Benign neoplasm of colon   . Coronary artery disease   . Degeneration of lumbar or lumbosacral intervertebral disc   . Dizziness - giddy   . Edema   . GERD (gastroesophageal reflux disease)   . Hyperlipidemia   .  Hypertension   . Iron deficiency anemia, unspecified   . Lumbago   . Pernicious anemia   . Symptomatic menopausal or female climacteric states   . Thoracic or lumbosacral neuritis or radiculitis, unspecified   . Type II diabetes mellitus ([facility])     MEDICATIONS: Current Outpatient Prescriptions on File Prior to Visit  Medication Sig Dispense Refill  . amLODipine (NORVASC) 5 MG tablet Take 1 tablet (5 mg total) by mouth daily. 90 tablet 3  . apixaban (ELIQUIS) 2.5 MG TABS tablet Take 1 tablet (2.5 mg total) by mouth 2 (two) times daily. 180 tablet 3  . atorvastatin (LIPITOR) 40 MG tablet Take 1 tablet (40 mg total) by mouth daily. 90 tablet 3  . cephALEXin (KEFLEX) 500 MG capsule Take 1 capsule (500 mg total) by mouth 3 (three) times daily. 21 capsule 0  . Cyanocobalamin (VITAMIN B-12 PO) Take by mouth daily.    [name] donepezil (ARICEPT) 10 MG tablet Take 1 tablet (10 mg total) by mouth at bedtime. 90 tablet 3  . ferrous sulfate 325 (65 FE) MG tablet Take 325 mg by mouth daily with breakfast.    . glucose blood (ONE TOUCH ULTRA TEST) test strip Use to check blood sugar 1 time per day. Dx Code: E11.9 100 each 12  . metFORMIN (GLUCOPHAGE) 500 MG tablet TAKE ONE TABLET BY MOUTH  TWICE DAILY WITH MEALS 180 tablet 0  . metoprolol succinate (TOPROL-XL) 50 MG 24 hr tablet TAKE 1 TABLET BY MOUTH DAILY. TAKE WITH OR IMMEDIATELY FOLLOWING A MEAL. 90 tablet 3  . nitroGLYCERIN (NITROSTAT) 0.4 MG SL tablet Place 0.4 mg under the tongue every 5 (five) minutes as needed for chest pain. Reported on 12/21/2015    . omeprazole (PRILOSEC) 40 MG capsule Take 1 capsule (40 mg total) by mouth daily. 30 capsule 11  . ondansetron (ZOFRAN ODT) 8 MG disintegrating tablet Take 1 tablet (8 mg total) by mouth every 8 (eight) hours as needed for nausea or vomiting. 10 tablet 0  . ONETOUCH DELICA LANCETS 61W MISC Use 1 lancet to check glucose once daily 100 each 1  . OVER THE COUNTER MEDICATION Place 1 patch onto the skin daily as  needed (pain). Arthritis pain reliever patch    . potassium chloride (K-DUR) 10 MEQ tablet Take 1 tablet (10 mEq total) by mouth daily. 90 tablet 3   No current facility-administered medications on file prior to visit.     ALLERGIES: Allergies  Allergen Reactions  . Penicillins Swelling      . Sulfonamide Derivatives Other (See Comments)    REACTION: "like my head was full of water"  . Albumin (Human) Other (See Comments)    Doesn't remember   . Clindamycin Other (See Comments)    Doesn't remember   . Iodides Hives  . Iodinated Diagnostic Agents Hives  . Sulfa Antibiotics Hives  . Celecoxib Itching and Rash  . Erythromycin Itching and Rash  . Nitrofuran Derivatives Rash  . Nitrofurantoin Itching and Rash  . Piroxicam Other (See Comments)    unknown  . Povidone-Iodine Itching and Rash    FAMILY HISTORY: Family History  Problem Relation Age of Onset  . Breast cancer Sister     "behind heart"  . Colon cancer Sister   . Colon cancer Sister     SOCIAL HISTORY: Social History   Social History  . Marital status: Widowed    Spouse name: N/A  . Number of children: 8  . Years of education: 10th Grade   Occupational History  . Not on file.   Social History Main Topics  . Smoking status: Never Smoker  . Smokeless tobacco: Current User    Types: Snuff  . Alcohol use No  . Drug use: No  . Sexual activity: No   Other Topics Concern  . Not on file   Social History Narrative   Patient lives with daughter in a two story home.   Patient has a 10th grade education.   Patient is retired.   Patient is right handed.    REVIEW OF SYSTEMS: Constitutional: No fevers, chills, or sweats, no generalized fatigue, change in appetite Eyes: No visual changes, double vision, eye pain Ear, nose and throat: No hearing loss, ear pain, nasal congestion, sore throat Cardiovascular: No chest pain, palpitations Respiratory:  No shortness of breath at rest or with exertion,  wheezes GastrointestinaI: No nausea, vomiting, diarrhea, +abdominal pain, no fecal incontinence Genitourinary:  No dysuria, urinary retention or frequency Musculoskeletal:  No neck pain, +back pain Integumentary: No rash, pruritus, skin lesions Neurological: as above Psychiatric: No depression, insomnia, anxiety Endocrine: No palpitations, fatigue, diaphoresis, mood swings, change in appetite, change in weight, increased thirst Hematologic/Lymphatic:  No anemia, purpura, petechiae. Allergic/Immunologic: no itchy/runny eyes, nasal congestion, recent allergic reactions, rashes  PHYSICAL EXAM: Vitals:   03/17/17 0849  BP: 126/68  Pulse: 76  Temp: 98.4 F (36.9 C)   General: No acute distress, sitting in wheelchair Head:  Normocephalic/atraumatic Neck: supple, no paraspinal tenderness, full range of motion Heart:  Regular rate and rhythm Lungs:  Clear to auscultation bilaterally Back: No paraspinal tenderness Skin/Extremities: No rash, no edema Neurological Exam: alert and oriented to person, place,day of week. No aphasia or dysarthria. Fund of knowledge is appropriate.  Remote memory intact. 0/3 delayed recall. Attention and concentration are normal. Able to say days of week backwards, difficulty spelling WORLD forward. Able to name objects and repeat phrases. CDT 4/5  MMSE - Mini Mental State Exam 03/17/2017 08/19/2016 08/02/2015  Orientation to time 1 2 3   Orientation to Place 4 5 5   Registration 3 3 3   Attention/ Calculation 0 0 0  Recall 0 0 0  Language- name 2 objects 2 2 2   Language- repeat 1 1 1   Language- follow 3 step command 3 3 2   Language- read & follow direction 1 1 1   Write a sentence 1 1 1   Copy design 0 0 1  Total score 16 18 19     Cranial nerves: Pupils equal, round, reactive to light. Extraocular movements intact with no nystagmus. Visual fields full. Facial sensation intact. No facial asymmetry. Tongue, uvula, palate midline.  Motor: Bulk and tone normal, muscle  strength 4/5 on left UE, otherwise 5/5 throughout (similar to prior).  Sensation to light touch intact.  No extinction to double simultaneous stimulation.  Deep tendon reflexes 2+ throughout, toes downgoing.  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  Gait not tested, sitting on wheelchair.  IMPRESSION: This is a pleasant 83 yo RH woman with a history of hypertension, diabetes, CAD, atrial fibrillation, mild to moderate dementia, stroke in October 2016 with residual left hemiparesis, who presented for evaluation of possible seizures. She has been having recurrent episodes of loss of consciousness over the past 5-6 years, admitted in August 2016 for transient aphasia, and during her hospital stay had an episode of generalized stiffening and unresponsiveness. She does have risk factors for seizures with dementia and family history of seizures. Convulsive syncope is also a consideration. She had a syncopal episode last June 2017, no seizure-like activity, with quick return to baseline. Her 24-hour EEG did not show any epileptiform discharges, there was occasional focal slowing over the left temporal region. She had a subcortical stroke in October 2016 and is now on Eliquis. No further similar syncopal episodes since June 2017, we have agreed to hold off on seizure medication at this time. Her MMSE today is 16/30 (18/30 in October 2017, 19/30 in September 2016), continue Aricept 10mg  daily. She was prescribed Depakote ER 250mg  qhs to help with behavioral changes associated with dementia, her daughter thought this was for sleep and stopped it. She will restart medication as previously discussed, if after a month and agitation continues, dose can be increased to BID. They will discuss abdominal pain concerns with Dr. Quay Burow. We again discussed the importance of control of vascular risk factors, physical exercise, and brain stimulation exercises for brain health. She will follow-up in 1 year and  knows to call for any changes.  Thank you for allowing me to participate in her care.  Please do not hesitate to call for any questions or concerns.  The duration of this appointment visit was 25 minutes of face-to-face time with the patient.  Greater than 50% of this time was spent in counseling, explanation of diagnosis, planning of further  management, and coordination of care.   Ellouise Newer, M.D.   CC: Dr. Loanne Drilling

## 2017-03-21 DIAGNOSIS — F0391 Unspecified dementia with behavioral disturbance: Secondary | ICD-10-CM | POA: Insufficient documentation

## 2017-03-21 DIAGNOSIS — F03B18 Unspecified dementia, moderate, with other behavioral disturbance: Secondary | ICD-10-CM | POA: Insufficient documentation

## 2017-03-31 ENCOUNTER — Telehealth: Payer: Self-pay | Admitting: Neurology

## 2017-03-31 NOTE — Telephone Encounter (Signed)
PT called and said she has not heard from anyone concerning a procedure she was supposed to have done of running a probe down her stomach

## 2017-03-31 NOTE — Telephone Encounter (Signed)
Returned call.  Spoke with pt daughter, Enid Derry, for clarification of phone message.  Let her know that per Dr. Amparo Bristol LOV notes pt/family are to speak with Dr. Quay Burow about her stomach pains.  Daughter was agreeable

## 2017-04-06 ENCOUNTER — Other Ambulatory Visit: Payer: Self-pay | Admitting: Cardiovascular Disease

## 2017-04-15 ENCOUNTER — Encounter: Payer: Self-pay | Admitting: Internal Medicine

## 2017-04-15 ENCOUNTER — Ambulatory Visit (INDEPENDENT_AMBULATORY_CARE_PROVIDER_SITE_OTHER): Payer: Medicare Other | Admitting: Internal Medicine

## 2017-04-15 ENCOUNTER — Other Ambulatory Visit: Payer: Medicare Other

## 2017-04-15 ENCOUNTER — Telehealth: Payer: Self-pay

## 2017-04-15 ENCOUNTER — Ambulatory Visit (INDEPENDENT_AMBULATORY_CARE_PROVIDER_SITE_OTHER)
Admission: RE | Admit: 2017-04-15 | Discharge: 2017-04-15 | Disposition: A | Discharge status: Home / Self Care | Payer: Medicare Other | Source: Ambulatory Visit | Attending: Internal Medicine | Admitting: Internal Medicine

## 2017-04-15 VITALS — BP 180/78 | HR 78 | Temp 97.8°F | Resp 16 | Wt 114.0 lb

## 2017-04-15 DIAGNOSIS — E538 Deficiency of other specified B group vitamins: Secondary | ICD-10-CM

## 2017-04-15 DIAGNOSIS — R1030 Lower abdominal pain, unspecified: Secondary | ICD-10-CM

## 2017-04-15 DIAGNOSIS — D509 Iron deficiency anemia, unspecified: Secondary | ICD-10-CM

## 2017-04-15 DIAGNOSIS — G8929 Other chronic pain: Secondary | ICD-10-CM | POA: Diagnosis not present

## 2017-04-15 DIAGNOSIS — I10 Essential (primary) hypertension: Secondary | ICD-10-CM

## 2017-04-15 DIAGNOSIS — E1122 Type 2 diabetes mellitus with diabetic chronic kidney disease: Secondary | ICD-10-CM | POA: Diagnosis not present

## 2017-04-15 DIAGNOSIS — D518 Other vitamin B12 deficiency anemias: Secondary | ICD-10-CM

## 2017-04-15 DIAGNOSIS — M5440 Lumbago with sciatica, unspecified side: Secondary | ICD-10-CM

## 2017-04-15 DIAGNOSIS — R300 Dysuria: Secondary | ICD-10-CM

## 2017-04-15 DIAGNOSIS — N181 Chronic kidney disease, stage 1: Secondary | ICD-10-CM

## 2017-04-15 DIAGNOSIS — I639 Cerebral infarction, unspecified: Secondary | ICD-10-CM

## 2017-04-15 DIAGNOSIS — M545 Low back pain: Secondary | ICD-10-CM | POA: Diagnosis not present

## 2017-04-15 NOTE — Assessment & Plan Note (Signed)
Better controlled at home Continue current medication Monitor at home daily

## 2017-04-15 NOTE — Assessment & Plan Note (Addendum)
Lumbar xray - likely arthritis Increase tylenol to up to 3000 mg a day May need tylenol #3 or tramadol

## 2017-04-15 NOTE — Telephone Encounter (Signed)
noted 

## 2017-04-15 NOTE — Progress Notes (Signed)
Subjective:    Patient ID: [name], female    DOB: 09/19/1925, 83 y.o.   MRN: [id number]  HPI She is here for an acute visit for stomach issues.   Abdominal pain:  She has chronic lower abdominal.  Her daughter does not feel it is worse than usual.  It is intermittent.  Nothing makes her abdominal pain come on.   She is unsure what makes it better.  She wonders if it is related to her chronic lower back pain.  She did go to the ED 02/25/17 for abdominal pain and was found to have a UTI.   Per her daughter the pain did get better after the antibiotics.  She denies changes in bowel habits, dysuria and hematuria.    Elevated blood pressure:  Her BP at home is pretty good 136-156/80's.  Her family checks it every day.  She denies chest pain, palpitations, sob and headaches.    Feet swelling:  She has b/l feet swelling.  Sometimes just one foot will swell.  She does not have lower leg swelling.  She elevates her feet during the day.  She is compliant with a low sodium diet.  The swelling is not daily.  It does not bother her or cause discomfort.   Diabetes: She is taking her medication daily as prescribed. She is compliant with a diabetic diet. She is not exercising regularly. She monitors her sugars and they have been running 100-160.    Medications and allergies reviewed with patient and updated if appropriate.  Patient Active Problem List   Diagnosis Date Noted  . Moderate dementia with behavioral disturbance 03/21/2017  . Cerebral infarction ([place]) 12/27/2015  . Convulsions ([place]) 09/11/2015  . SVT (supraventricular tachycardia) ([place])   . Fall 08/21/2015  . PAF (paroxysmal atrial fibrillation) ([place])   . Stroke (cerebrum) ([place]) 08/20/2015  . Awareness alteration, transient 08/02/2015  . Aphasia   . TIA (transient ischemic attack)   . FTT (failure to thrive) in adult   . Memory loss 02/24/2015  . Syncope 10/31/2014  . Diabetes mellitus type 2, controlled ([place]) 10/31/2014  .  Uncontrolled hypertension 10/31/2014  . Headache 11/26/2013  . CAD, NATIVE VESSEL 02/14/2010  . GERD 02/14/2010  . ANEMIA-B12 DEFICIENCY 01/01/2010  . OSTEOARTHRITIS 01/01/2010  . PERSONAL HX COLONIC POLYPS 01/01/2010  . Iron deficiency anemia 12/28/2009  . DIZZINESS AND GIDDINESS 12/28/2009  . Lower abdominal pain 09/13/2009  . Chronic lower back pain 08/11/2008  . KNEE PAIN, RIGHT 02/11/2008  . Dyslipidemia 04/23/2007  . Allergic rhinitis 04/23/2007  . GALLBLADDER DISEASE 04/23/2007  . DIVERTICULITIS, HX OF 04/23/2007    Current Outpatient Prescriptions on File Prior to Visit  Medication Sig Dispense Refill  . amLODipine (NORVASC) 5 MG tablet Take 1 tablet (5 mg total) by mouth daily. 90 tablet 3  . apixaban (ELIQUIS) 2.5 MG TABS tablet Take 1 tablet (2.5 mg total) by mouth 2 (two) times daily. 180 tablet 3  . atorvastatin (LIPITOR) 40 MG tablet Take 1 tablet (40 mg total) by mouth daily. 90 tablet 3  . Cyanocobalamin (VITAMIN B-12 PO) Take by mouth daily.    . divalproex (DEPAKOTE ER) 250 MG 24 hr tablet Take 1 tablet (250 mg total) by mouth daily. 30 tablet 6  . donepezil (ARICEPT) 10 MG tablet Take 1 tablet (10 mg total) by mouth at bedtime. 90 tablet 3  . ferrous sulfate 325 (65 FE) MG tablet Take 325 mg by mouth daily with breakfast.    .  glucose blood (ONE TOUCH ULTRA TEST) test strip Use to check blood sugar 1 time per day. Dx Code: E11.9 100 each 12  . KLOR-CON 10 10 MEQ tablet TAKE 1 TABLET BY MOUTH EVERY DAY 90 tablet 1  . metFORMIN (GLUCOPHAGE) 500 MG tablet TAKE 1 TABLET BY MOUTH TWICE DAILY WITH MEALS (NEEDS  APPOINTMENT) 180 tablet 0  . metoprolol succinate (TOPROL-XL) 50 MG 24 hr tablet TAKE 1 TABLET BY MOUTH DAILY. TAKE WITH OR IMMEDIATELY FOLLOWING A MEAL. 90 tablet 3  . nitroGLYCERIN (NITROSTAT) 0.4 MG SL tablet Place 0.4 mg under the tongue every 5 (five) minutes as needed for chest pain. Reported on 12/21/2015    . omeprazole (PRILOSEC) 40 MG capsule Take 1 capsule  (40 mg total) by mouth daily. 30 capsule 11  . ondansetron (ZOFRAN ODT) 8 MG disintegrating tablet Take 1 tablet (8 mg total) by mouth every 8 (eight) hours as needed for nausea or vomiting. 10 tablet 0  . ONETOUCH DELICA LANCETS 17O MISC Use 1 lancet to check glucose once daily 100 each 1  . OVER THE COUNTER MEDICATION Place 1 patch onto the skin daily as needed (pain). Arthritis pain reliever patch     No current facility-administered medications on file prior to visit.     Past Medical History:  Diagnosis Date  . Abdominal pain   . Allergic rhinitis, cause unspecified   . Anemia B twelve deficiency   . Arrhythmia    paroxysmal atrial fibrillation  . Arthritis    "arms and hands" (06/30/2013)  . Benign neoplasm of colon   . Coronary artery disease   . Degeneration of lumbar or lumbosacral intervertebral disc   . Dizziness - giddy   . Edema   . GERD (gastroesophageal reflux disease)   . Hyperlipidemia   . Hypertension   . Iron deficiency anemia, unspecified   . Lumbago   . Pernicious anemia   . Symptomatic menopausal or female climacteric states   . Thoracic or lumbosacral neuritis or radiculitis, unspecified   . Type II diabetes mellitus ([place])     Past Surgical History:  Procedure Laterality Date  . ABDOMINAL HYSTERECTOMY  1974  . APPENDECTOMY  1974  . BACK SURGERY    . CARDIAC CATHETERIZATION    . CARPAL TUNNEL RELEASE Bilateral 1970's  . CATARACT EXTRACTION, BILATERAL Bilateral   . CHOLECYSTECTOMY    . CORONARY ANGIOPLASTY WITH STENT PLACEMENT     "1" (06/30/2013  . LUMBAR DISC SURGERY      Social History   Social History  . Marital status: Widowed    Spouse name: N/A  . Number of children: 8  . Years of education: 10th Grade   Social History Main Topics  . Smoking status: Never Smoker  . Smokeless tobacco: Current User    Types: Snuff  . Alcohol use No  . Drug use: No  . Sexual activity: No   Other Topics Concern  . Not on file   Social History  Narrative   Patient lives with daughter in a two story home.   Patient has a 10th grade education.   Patient is retired.   Patient is right handed.    Family History  Problem Relation Age of Onset  . Breast cancer Sister        "behind heart"  . Colon cancer Sister   . Colon cancer Sister     Review of Systems  Constitutional: Negative for appetite change, chills and fever.  HENT: Positive  for rhinorrhea.   Respiratory: Negative for cough, shortness of breath and wheezing.   Cardiovascular: Positive for leg swelling (feet, intermittent). Negative for chest pain and palpitations.  Gastrointestinal: Positive for abdominal pain and nausea (occasional). Negative for blood in stool (no black stool), constipation and diarrhea.       Rare gerd  Genitourinary: Negative for dysuria, frequency and hematuria.  Musculoskeletal: Positive for back pain.  Neurological: Negative for light-headedness and headaches.       Objective:   Vitals:   04/15/17 0812  BP: (!) 180/78  Pulse: 78  Resp: 16  Temp: 97.8 F (36.6 C)   Filed Weights   04/15/17 0812  Weight: 114 lb (51.7 kg)   Body mass index is 22.26 kg/m.  Wt Readings from Last 3 Encounters:  04/15/17 114 lb (51.7 kg)  03/17/17 120 lb (54.4 kg)  02/11/17 111 lb 1.9 oz (50.4 kg)     Physical Exam Constitutional: Appears well-developed and well-nourished. No distress.  HENT:  Head: Normocephalic and atraumatic.  Neck: Neck supple. No tracheal deviation present. No thyromegaly present.  No cervical lymphadenopathy Cardiovascular: Normal rate, regular rhythm and normal heart sounds.   No murmur heard. No carotid bruit .  No edema Pulmonary/Chest: Effort normal and breath sounds normal. No respiratory distress. No has no wheezes. No rales.  Abdomen: soft, non distended, tenderness across lower abdomen, concentrated just right of suprapubic region, no rebound or guarding Msk: no spinal deformity, mild tenderness with palpation  lumbar spine Skin: Skin is warm and dry. Not diaphoretic.  Psychiatric: Normal mood and affect. Behavior is normal.     Diabetic Foot Exam - Simple   Simple Foot Form Diabetic Foot exam was performed with the following findings:  Yes 04/15/2017  8:55 AM  Visual Inspection No deformities, no ulcerations, no other skin breakdown bilaterally:  Yes Sensation Testing Intact to touch and monofilament testing bilaterally:  Yes Pulse Check Posterior Tibialis and Dorsalis pulse intact bilaterally:  Yes Comments Onychomycosis of toenails     CT Abdomen Pelvis Wo Contrast CLINICAL DATA:  83 year old female with abdominal pain maximal in the right upper quadrant. Nausea and vomiting. Initial encounter.  EXAM: CT ABDOMEN AND PELVIS WITHOUT CONTRAST  TECHNIQUE: Multidetector CT imaging of the abdomen and pelvis was performed following the standard protocol without IV contrast.  COMPARISON:  Noncontrast CT Abdomen and Pelvis 09/26/2014  FINDINGS: Lower chest: Mild atelectasis or scarring in the right lower lobe. Mild lower lobe bronchiectasis. Calcified aortic and coronary artery atherosclerosis. No cardiomegaly, pericardial effusion or pleural effusion.  Hepatobiliary: Surgically absent gallbladder. Negative noncontrast liver.  Pancreas: Negative.  Spleen: Negative.  Adrenals/Urinary Tract: Negative adrenal glands. No perinephric stranding or hydronephrosis. No nephrolithiasis. Stable and negative noncontrast right kidney since 2015. New or increased simple fluid density left renal lower pole cyst, but otherwise stable and negative noncontrast left kidney. No periureteral stranding or hydroureter. Urinary bladder is remarkable for a chronic mildly increased right posterior bladder diverticulum measuring 19 mm (series 3, image 67).  Stomach/Bowel: Gas and retained stool in the rectum. No rectal wall thickening. Largely decompressed and negative sigmoid colon. Occasional  descending colon diverticula with no active inflammation. Negative transverse colon. Moderate diverticulosis of the right colon, but no definite active inflammation. Negative terminal ileum. Diminutive or absent appendix. No pericecal inflammation. No dilated small bowel. Decompressed stomach and duodenum.  No abdominal free fluid or free air.  Vascular/Lymphatic: Aortoiliac calcified atherosclerosis. Vascular patency is not evaluated in the absence of IV  contrast.  No lymphadenopathy.  Reproductive: Appear to be chronically surgically absent.  Other: No pelvic free fluid.  Musculoskeletal: No acute osseous abnormality identified.  IMPRESSION: 1. No acute or inflammatory process identified in the noncontrast abdomen or pelvis. 2. Diverticulosis of the ascending more so than descending colon, but no active inflammation identified. 3. Chronic but increased posterior right bladder diverticulum. 4.  Calcified aortic atherosclerosis.  Electronically Signed   By: Genevie Ann M.D.   On: 02/25/2017 10:37       Assessment & Plan:   See Problem List for Assessment and Plan of chronic medical problems.

## 2017-04-15 NOTE — Assessment & Plan Note (Signed)
Sugars well controlled at home Check a1c

## 2017-04-15 NOTE — Patient Instructions (Addendum)
Have blood work and an Insurance account manager done today.   Test(s) ordered today. Your results will be released to [facility] (or called to you) after review, usually within 72hours after test completion. If any changes need to be made, you will be notified at that same time.   Medications reviewed and updated.  Changes include increasing your tylenol - take up to 3000 mg a day.     Please followup in 6 months

## 2017-04-15 NOTE — Assessment & Plan Note (Signed)
Taking iron daily Check cbc, iron, ferritin 

## 2017-04-15 NOTE — Telephone Encounter (Signed)
[name] from the lab called and stated that pt went the hall way bathroom and has not returned and no sign of them.  The labs are showing collected because she checked into the lab.  They are not sure if the patient will return.

## 2017-04-15 NOTE — Assessment & Plan Note (Signed)
Chronic Has been relieved with treatment of a UTI in the past Check UA, UCx Has bladder diverticulum - ? Cause of pain  Check cbc, cmp Had recent Ct Ab/Pelvis - no need to repeat

## 2017-04-15 NOTE — Assessment & Plan Note (Signed)
Cbc, b12

## 2017-04-16 ENCOUNTER — Other Ambulatory Visit (INDEPENDENT_AMBULATORY_CARE_PROVIDER_SITE_OTHER): Payer: Medicare Other

## 2017-04-16 ENCOUNTER — Ambulatory Visit: Payer: Medicare Other | Admitting: Internal Medicine

## 2017-04-16 DIAGNOSIS — D509 Iron deficiency anemia, unspecified: Secondary | ICD-10-CM | POA: Diagnosis not present

## 2017-04-16 DIAGNOSIS — N181 Chronic kidney disease, stage 1: Secondary | ICD-10-CM

## 2017-04-16 DIAGNOSIS — E1122 Type 2 diabetes mellitus with diabetic chronic kidney disease: Secondary | ICD-10-CM

## 2017-04-16 DIAGNOSIS — R300 Dysuria: Secondary | ICD-10-CM | POA: Diagnosis not present

## 2017-04-16 DIAGNOSIS — I10 Essential (primary) hypertension: Secondary | ICD-10-CM | POA: Diagnosis not present

## 2017-04-16 LAB — CBC WITH DIFFERENTIAL/PLATELET
Basophils Absolute: 0 10*3/uL (ref 0.0–0.1)
Basophils Relative: 0.4 % (ref 0.0–3.0)
Eosinophils Absolute: 0.3 10*3/uL (ref 0.0–0.7)
Eosinophils Relative: 3.3 % (ref 0.0–5.0)
HCT: 34.5 % — ABNORMAL LOW (ref 36.0–46.0)
Hemoglobin: 11 g/dL — ABNORMAL LOW (ref 12.0–15.0)
Lymphocytes Relative: 41 % (ref 12.0–46.0)
Lymphs Abs: 3.2 10*3/uL (ref 0.7–4.0)
MCHC: 31.9 g/dL (ref 30.0–36.0)
MCV: 87.1 fl (ref 78.0–100.0)
Monocytes Absolute: 0.6 10*3/uL (ref 0.1–1.0)
Monocytes Relative: 7.4 % (ref 3.0–12.0)
Neutro Abs: 3.7 10*3/uL (ref 1.4–7.7)
Neutrophils Relative %: 47.9 % (ref 43.0–77.0)
Platelets: 278 10*3/uL (ref 150.0–400.0)
RBC: 3.96 Mil/uL (ref 3.87–5.11)
RDW: 15.7 % — ABNORMAL HIGH (ref 11.5–15.5)
WBC: 7.8 10*3/uL (ref 4.0–10.5)

## 2017-04-16 LAB — COMPREHENSIVE METABOLIC PANEL
ALT: 12 U/L (ref 0–35)
AST: 20 U/L (ref 0–37)
Albumin: 4.1 g/dL (ref 3.5–5.2)
Alkaline Phosphatase: 83 U/L (ref 39–117)
BUN: 23 mg/dL (ref 6–23)
CO2: 22 mEq/L (ref 19–32)
Calcium: 9.6 mg/dL (ref 8.4–10.5)
Chloride: 105 mEq/L (ref 96–112)
Creatinine, Ser: 0.79 mg/dL (ref 0.40–1.20)
GFR: 87.85 mL/min (ref >60.00)
Glucose, Bld: 147 mg/dL — ABNORMAL HIGH (ref 70–99)
Potassium: 4.1 mEq/L (ref 3.5–5.1)
Sodium: 140 mEq/L (ref 135–145)
Total Bilirubin: 0.5 mg/dL (ref 0.2–1.2)
Total Protein: 7.4 g/dL (ref 6.0–8.3)

## 2017-04-16 LAB — IRON: Iron: 35 ug/dL — ABNORMAL LOW (ref 42–145)

## 2017-04-16 LAB — URINALYSIS, ROUTINE W REFLEX MICROSCOPIC
Bilirubin Urine: NEGATIVE
Nitrite: NEGATIVE
Specific Gravity, Urine: 1.025 (ref 1.000–1.030)
Total Protein, Urine: 100 — AB
Urine Glucose: NEGATIVE
Urobilinogen, UA: 0.2 (ref 0.0–1.0)
pH: 6 (ref 5.0–8.0)

## 2017-04-16 LAB — HEMOGLOBIN A1C: Hgb A1c MFr Bld: 7.9 % — ABNORMAL HIGH (ref 4.6–6.5)

## 2017-04-16 LAB — MICROALBUMIN / CREATININE URINE RATIO
Creatinine,U: 105.3 mg/dL
Microalb Creat Ratio: 77.2 mg/g — ABNORMAL HIGH (ref 0.0–30.0)
Microalb, Ur: 81.3 mg/dL — ABNORMAL HIGH (ref 0.0–1.9)

## 2017-04-16 LAB — FERRITIN: Ferritin: 32 ng/mL (ref 10.0–291.0)

## 2017-04-16 NOTE — Addendum Note (Signed)
Addended by: Terence Lux B on: 04/16/2017 08:37 AM   Modules accepted: Orders

## 2017-04-16 NOTE — Telephone Encounter (Signed)
Spoke with pts daughter and they are going to bring her back for the lab work. Lab work re-entered.

## 2017-04-17 LAB — URINE CULTURE: Organism ID, Bacteria: NO GROWTH

## 2017-04-29 ENCOUNTER — Telehealth: Payer: Self-pay | Admitting: Internal Medicine

## 2017-04-29 DIAGNOSIS — R109 Unspecified abdominal pain: Secondary | ICD-10-CM

## 2017-04-29 NOTE — Telephone Encounter (Signed)
Pt daughter called stating her mother is still having problems with her stomach, she could not tell me what exactly is wrong, I asked her if the Pt is in pain, is she having trouble going to the bathroom, is she nauseous? and all she said was nower dont know what is wrong. They would like to know what they can do  Please call back

## 2017-04-29 NOTE — Telephone Encounter (Signed)
Spoke with daughter, she was okay with having the referral entered.

## 2017-04-29 NOTE — Telephone Encounter (Signed)
I can refer her to GI if she agrees  (referral ordered).

## 2017-05-20 ENCOUNTER — Ambulatory Visit (INDEPENDENT_AMBULATORY_CARE_PROVIDER_SITE_OTHER): Payer: Medicare Other | Admitting: Nurse Practitioner

## 2017-05-20 ENCOUNTER — Encounter: Payer: Self-pay | Admitting: Nurse Practitioner

## 2017-05-20 ENCOUNTER — Encounter (INDEPENDENT_AMBULATORY_CARE_PROVIDER_SITE_OTHER): Payer: Self-pay

## 2017-05-20 VITALS — BP 162/60 | HR 68

## 2017-05-20 DIAGNOSIS — R1031 Right lower quadrant pain: Secondary | ICD-10-CM

## 2017-05-20 DIAGNOSIS — G8929 Other chronic pain: Secondary | ICD-10-CM | POA: Diagnosis not present

## 2017-05-20 DIAGNOSIS — R197 Diarrhea, unspecified: Secondary | ICD-10-CM

## 2017-05-20 NOTE — Patient Instructions (Addendum)
If you are age 81 or older, your body mass index should be between 23-30. Your There is no height or weight on file to calculate BMI. If this is out of the aforementioned range listed, please consider follow up with your Primary Care Provider.  If you are age 38 or younger, your body mass index should be between 19-25. Your There is no height or weight on file to calculate BMI. If this is out of the aformentioned range listed, please consider follow up with your Primary Care Provider.   STOP Iron for 2-3 weeks. Call in 2-3 weeks with an update.  Follow up with Tye Savoy, NP in 4-5 weeks.  Please call the office to set up appointment as the schedule is not available at this time.  Thank you for choosing me and [facility].   Tye Savoy, NP

## 2017-05-20 NOTE — Progress Notes (Signed)
HPI:  [name] is a 83 year old female known previously to [name] but not seen here in years. She has a remote history of adenomatous colon polyps. A descending colon polyps was removed at time of last colonoscopy in 2011. Path c/w a tubular adenoma without high grade dysplasia. [name] She was sent a colonoscopy recall letter in 2016. [name] is referred by PCP Dr. Billey Gosling for evaluation of lower abdominal pain.[name] Andrea KitchenPatient seen in ED in April for abdominal pain. Hgb was 9.5 , down from baseline of mid 10 range, Her CMET was unremarkable. No acute abnormalities on non-contrast CTscan. She was treated for UTI without improvement in pain. No dysuria. The lower abdominal pain is more pronounced in the RLQ quadrant. It is unrelated to eating or bowel movements. She saw PCP for ED follow up early June. CMET unremarkalle. Her hgb had improved to 11.  [name] complains of loose stools which she attributes to iron pills. No blood in stools. Her weight is down a few pounds but overall stable.   Past Medical History:  Diagnosis Date  . Abdominal pain   . Allergic rhinitis, cause unspecified   . Anemia B twelve deficiency   . Arrhythmia    paroxysmal atrial fibrillation  . Arthritis    "arms and hands" (06/30/2013)  . Benign neoplasm of colon   . Colon polyp   . Coronary artery disease   . Degeneration of lumbar or lumbosacral intervertebral disc   . Dizziness - giddy   . Edema   . GERD (gastroesophageal reflux disease)   . Hyperlipidemia   . Hypertension   . Iron deficiency anemia, unspecified   . Lumbago   . Pernicious anemia   . Seizures ([place])   . Stroke ([place])   . Symptomatic menopausal or female climacteric states   . Thoracic or lumbosacral neuritis or radiculitis, unspecified   . Type II diabetes mellitus ([place])      Past Surgical History:  Procedure Laterality Date  . ABDOMINAL HYSTERECTOMY  1974  . APPENDECTOMY  1974  . CARDIAC CATHETERIZATION    . CARPAL TUNNEL RELEASE  Bilateral 1970's  . CATARACT EXTRACTION, BILATERAL Bilateral   . CHOLECYSTECTOMY    . CORONARY ANGIOPLASTY WITH STENT PLACEMENT     "1" (06/30/2013  . LUMBAR DISC SURGERY     Family History  Problem Relation Age of Onset  . Breast cancer Sister        "behind heart"  . Colon cancer Sister   . Colon cancer Sister   . Breast cancer Daughter   . Breast cancer Daughter   . Breast cancer Daughter   . Kidney cancer Daughter    Social History  Substance Use Topics  . Smoking status: Never Smoker  . Smokeless tobacco: Current User    Types: Snuff  . Alcohol use No   Current Outpatient Prescriptions  Medication Sig Dispense Refill  . amLODipine (NORVASC) 5 MG tablet Take 1 tablet (5 mg total) by mouth daily. 90 tablet 3  . apixaban (ELIQUIS) 2.5 MG TABS tablet Take 1 tablet (2.5 mg total) by mouth 2 (two) times daily. 180 tablet 3  . atorvastatin (LIPITOR) 40 MG tablet Take 1 tablet (40 mg total) by mouth daily. 90 tablet 3  . Cyanocobalamin (VITAMIN B-12 PO) Take by mouth daily.    . divalproex (DEPAKOTE ER) 250 MG 24 hr tablet Take 1 tablet (250 mg total) by mouth daily. 30 tablet 6  . donepezil (  ARICEPT) 10 MG tablet Take 1 tablet (10 mg total) by mouth at bedtime. 90 tablet 3  . ferrous sulfate 325 (65 FE) MG tablet Take 325 mg by mouth daily with breakfast.    . glucose blood (ONE TOUCH ULTRA TEST) test strip Use to check blood sugar 1 time per day. Dx Code: E11.9 100 each 12  . KLOR-CON 10 10 MEQ tablet TAKE 1 TABLET BY MOUTH EVERY DAY 90 tablet 1  . metFORMIN (GLUCOPHAGE) 500 MG tablet TAKE 1 TABLET BY MOUTH TWICE DAILY WITH MEALS (NEEDS  APPOINTMENT) 180 tablet 0  . metoprolol succinate (TOPROL-XL) 50 MG 24 hr tablet TAKE 1 TABLET BY MOUTH DAILY. TAKE WITH OR IMMEDIATELY FOLLOWING A MEAL. 90 tablet 3  . nitroGLYCERIN (NITROSTAT) 0.4 MG SL tablet Place 0.4 mg under the tongue every 5 (five) minutes as needed for chest pain. Reported on 12/21/2015    . omeprazole (PRILOSEC) 40 MG  capsule Take 1 capsule (40 mg total) by mouth daily. 30 capsule 11  . ondansetron (ZOFRAN ODT) 8 MG disintegrating tablet Take 1 tablet (8 mg total) by mouth every 8 (eight) hours as needed for nausea or vomiting. 10 tablet 0  . ONETOUCH DELICA LANCETS 73X MISC Use 1 lancet to check glucose once daily 100 each 1  . OVER THE COUNTER MEDICATION Place 1 patch onto the skin daily as needed (pain). Arthritis pain reliever patch     No current facility-administered medications for this visit.    Allergies  Allergen Reactions  . Clindamycin Other (See Comments)    unspecified  . Iodides Hives  . Iodinated Diagnostic Agents Hives  . Penicillins Swelling    If all of the above answers are "NO", then may proceed with Cephalosporin use.  . Sulfa Antibiotics Hives  . Sulfonamide Derivatives Other (See Comments)    REACTION: "like my head was full of water"  . Albumin (Human) Other (See Comments)    unspecified  . Celecoxib Itching and Rash  . Erythromycin Itching and Rash  . Nitrofuran Derivatives Rash  . Nitrofurantoin Itching and Rash  . Piroxicam Other (See Comments)    unknown  . Povidone-Iodine Itching and Rash     Review of Systems: All systems reviewed and negative except where noted in HPI.    Physical Exam: BP (!) 162/60   Pulse 68  Constitutional:  Well-developed, black female in no acute distress in a wheelchair.[name] Psychiatric: Normal mood and affect. Behavior is normal. EENT: Pupils normal.  Conjunctivae are normal. No scleral icterus. Neck supple.  Cardiovascular: Normal rate, regular rhythm. No edema Pulmonary/chest: Effort normal and breath sounds normal. No wheezing, rales or rhonchi. Abdominal: Soft, nondistended. Mild RLQ tenderness. Bowel sounds active throughout.  Lymphadenopathy: No cervical adenopathy noted. Neurological: Alert and oriented to person place and time. Skin: Skin is warm and dry. No rashes noted.   ASSESSMENT AND PLAN:  13. 83 year old female  with chronic lower abdominal pain (predominantly RLQ) of unclear etiology. Non-contrast CTscan unrevealing. No urinary symptoms. Pain not related to eating or BMs. She is s/p abdominal hysterectomy and appendectomy.. Recent labs unremarkable. Also reports loose stools which she believes is secondary to iron pills.  -Unusual that iron would cause loose stools but [name] adamant about it being the culprit.  Not having profuse diarrhea but I still feel like C-diff needs to be considered, especially given that she had Keflex in April  -We have agreed to hold the iron for a couple of weeks. If  loose stool persists then I highly recommend she submit a stool sample. Daughter present and will cal with condition update in the next several days.   2. Multiple drug allergies.   3. Hx of colon polyps. Tubular adenoma without HGD in 2011. Given advanced age she is no longer undergoing surveillance colonoscopies.    Tye Savoy, NP  05/20/2017, 10:35 AM  Cc: Binnie Rail, MD

## 2017-05-24 NOTE — Progress Notes (Signed)
Assessment and plans reviewed  

## 2017-05-25 NOTE — Progress Notes (Signed)
Subjective:    Patient ID: [name], female    DOB: [birth year], 83 y.o.   MRN: [id number]  HPI She is here for follow up.  Hypertension: She is taking her medication daily. She is compliant with a low sodium diet.  She denies chest pain, palpitations, shortness of breath and regular headaches. She is not exercising regularly.  She does monitor her blood pressure at home and it is better controlled.    Diabetes: She is taking her medication daily as prescribed. She is compliant with a diabetic diet. She is not exercising regularly.. She checks her feet daily and denies foot lesions. She is up-to-date with an ophthalmology examination.   Chronic abdominal pain:  She has intermittent chronic lower abdominal pain.  she has intermittent loose stools.  She recently saw GI.  They advised she hold the iron for two weeks.  She will also submit a stool sample.  There has been no change in the pain since stopping the iron.  She does not have pain daily.  Her stool color is normal since stopping the iron. She denies blood in the stool.  There is not pattern to when she has the pain.  She currently is not in pain.   Medications and allergies reviewed with patient and updated if appropriate.  Patient Active Problem List   Diagnosis Date Noted  . Moderate dementia with behavioral disturbance 03/21/2017  . Cerebral infarction ([place]) 12/27/2015  . Convulsions ([place]) 09/11/2015  . SVT (supraventricular tachycardia) ([place])   . Fall 08/21/2015  . PAF (paroxysmal atrial fibrillation) ([place])   . Stroke (cerebrum) ([place]) 08/20/2015  . Awareness alteration, transient 08/02/2015  . Aphasia   . TIA (transient ischemic attack)   . FTT (failure to thrive) in adult   . Memory loss 02/24/2015  . Syncope 10/31/2014  . Diabetes mellitus type 2, controlled ([place]) 10/31/2014  . Uncontrolled hypertension 10/31/2014  . Headache 11/26/2013  . CAD, NATIVE VESSEL 02/14/2010  . GERD 02/14/2010  . ANEMIA-B12  DEFICIENCY 01/01/2010  . OSTEOARTHRITIS 01/01/2010  . PERSONAL HX COLONIC POLYPS 01/01/2010  . Iron deficiency anemia 12/28/2009  . DIZZINESS AND GIDDINESS 12/28/2009  . Lower abdominal pain 09/13/2009  . Chronic lower back pain 08/11/2008  . KNEE PAIN, RIGHT 02/11/2008  . Dyslipidemia 04/23/2007  . Allergic rhinitis 04/23/2007  . GALLBLADDER DISEASE 04/23/2007  . DIVERTICULITIS, HX OF 04/23/2007    Current Outpatient Prescriptions on File Prior to Visit  Medication Sig Dispense Refill  . amLODipine (NORVASC) 5 MG tablet Take 1 tablet (5 mg total) by mouth daily. 90 tablet 3  . apixaban (ELIQUIS) 2.5 MG TABS tablet Take 1 tablet (2.5 mg total) by mouth 2 (two) times daily. 180 tablet 3  . atorvastatin (LIPITOR) 40 MG tablet Take 1 tablet (40 mg total) by mouth daily. 90 tablet 3  . Cyanocobalamin (VITAMIN B-12 PO) Take by mouth daily.    . divalproex (DEPAKOTE ER) 250 MG 24 hr tablet Take 1 tablet (250 mg total) by mouth daily. 30 tablet 6  . donepezil (ARICEPT) 10 MG tablet Take 1 tablet (10 mg total) by mouth at bedtime. 90 tablet 3  . ferrous sulfate 325 (65 FE) MG tablet Take 325 mg by mouth daily with breakfast.    . glucose blood (ONE TOUCH ULTRA TEST) test strip Use to check blood sugar 1 time per day. Dx Code: E11.9 100 each 12  . KLOR-CON 10 10 MEQ tablet TAKE 1 TABLET BY MOUTH EVERY DAY 90  tablet 1  . metFORMIN (GLUCOPHAGE) 500 MG tablet TAKE 1 TABLET BY MOUTH TWICE DAILY WITH MEALS (NEEDS  APPOINTMENT) 180 tablet 0  . metoprolol succinate (TOPROL-XL) 50 MG 24 hr tablet TAKE 1 TABLET BY MOUTH DAILY. TAKE WITH OR IMMEDIATELY FOLLOWING A MEAL. 90 tablet 3  . nitroGLYCERIN (NITROSTAT) 0.4 MG SL tablet Place 0.4 mg under the tongue every 5 (five) minutes as needed for chest pain. Reported on 12/21/2015    . omeprazole (PRILOSEC) 40 MG capsule Take 1 capsule (40 mg total) by mouth daily. 30 capsule 11  . ondansetron (ZOFRAN ODT) 8 MG disintegrating tablet Take 1 tablet (8 mg total) by  mouth every 8 (eight) hours as needed for nausea or vomiting. 10 tablet 0  . ONETOUCH DELICA LANCETS 23J MISC Use 1 lancet to check glucose once daily 100 each 1  . OVER THE COUNTER MEDICATION Place 1 patch onto the skin daily as needed (pain). Arthritis pain reliever patch     No current facility-administered medications on file prior to visit.     Past Medical History:  Diagnosis Date  . Abdominal pain   . Allergic rhinitis, cause unspecified   . Anemia B twelve deficiency   . Arrhythmia    paroxysmal atrial fibrillation  . Arthritis    "arms and hands" (06/30/2013)  . Benign neoplasm of colon   . Colon polyp   . Coronary artery disease   . Degeneration of lumbar or lumbosacral intervertebral disc   . Dizziness - giddy   . Edema   . GERD (gastroesophageal reflux disease)   . Hyperlipidemia   . Hypertension   . Iron deficiency anemia, unspecified   . Lumbago   . Pernicious anemia   . Seizures ([place])   . Stroke ([place])   . Symptomatic menopausal or female climacteric states   . Thoracic or lumbosacral neuritis or radiculitis, unspecified   . Type II diabetes mellitus ([place])     Past Surgical History:  Procedure Laterality Date  . ABDOMINAL HYSTERECTOMY  1974  . APPENDECTOMY  1974  . CARDIAC CATHETERIZATION    . CARPAL TUNNEL RELEASE Bilateral 1970's  . CATARACT EXTRACTION, BILATERAL Bilateral   . CHOLECYSTECTOMY    . CORONARY ANGIOPLASTY WITH STENT PLACEMENT     "1" (06/30/2013  . LUMBAR DISC SURGERY      Social History   Social History  . Marital status: Widowed    Spouse name: N/A  . Number of children: 8  . Years of education: 10th Grade   Social History Main Topics  . Smoking status: Never Smoker  . Smokeless tobacco: Current User    Types: Snuff  . Alcohol use No  . Drug use: No  . Sexual activity: No   Other Topics Concern  . Not on file   Social History Narrative   Patient lives with daughter in a two story home.   Patient has a 10th grade  education.   Patient is retired.   Patient is right handed.    Family History  Problem Relation Age of Onset  . Breast cancer Sister        "behind heart"  . Colon cancer Sister   . Colon cancer Sister   . Breast cancer Daughter   . Breast cancer Daughter   . Breast cancer Daughter   . Kidney cancer Daughter     Review of Systems  Constitutional: Negative for chills and fever.  Respiratory: Negative for cough, shortness of breath and wheezing.  Cardiovascular: Positive for leg swelling (left side from previous stroke). Negative for chest pain and palpitations.  Gastrointestinal: Positive for abdominal pain (sometimes 2-3 times a days, some days none) and nausea. Negative for blood in stool, constipation and diarrhea (loose stools).  Neurological: Positive for headaches (mild sinus ). Negative for light-headedness.       Objective:   Vitals:   05/26/17 0845  BP: (!) 178/72  Pulse: 68  Resp: 16  Temp: 97.8 F (36.6 C)   Filed Weights   05/26/17 0845  Weight: 113 lb (51.3 kg)   Body mass index is 22.07 kg/m.  Wt Readings from Last 3 Encounters:  05/26/17 113 lb (51.3 kg)  04/15/17 114 lb (51.7 kg)  03/17/17 120 lb (54.4 kg)     Physical Exam Constitutional: Appears well-developed and well-nourished. No distress.  HENT:  Head: Normocephalic and atraumatic.  Neck: Neck supple. No tracheal deviation present. No thyromegaly present.  No cervical lymphadenopathy Cardiovascular: Normal rate, regular rhythm and normal heart sounds.   2/6 systolic murmur heard. No carotid bruit .  Mild left leg edema Pulmonary/Chest: Effort normal and breath sounds normal. No respiratory distress. No has no wheezes. No rales.  Abdomen: soft, non-tender, no distended Skin: Skin is warm and dry. Not diaphoretic.  Psychiatric: Normal mood and affect. Behavior is normal.       Assessment & Plan:   See Problem List for Assessment and Plan of chronic medical problems.

## 2017-05-26 ENCOUNTER — Ambulatory Visit (INDEPENDENT_AMBULATORY_CARE_PROVIDER_SITE_OTHER): Payer: Medicare Other | Admitting: Internal Medicine

## 2017-05-26 ENCOUNTER — Encounter: Payer: Self-pay | Admitting: Internal Medicine

## 2017-05-26 VITALS — BP 178/72 | HR 68 | Temp 97.8°F | Resp 16 | Wt 113.0 lb

## 2017-05-26 DIAGNOSIS — N181 Chronic kidney disease, stage 1: Secondary | ICD-10-CM | POA: Diagnosis not present

## 2017-05-26 DIAGNOSIS — R1030 Lower abdominal pain, unspecified: Secondary | ICD-10-CM

## 2017-05-26 DIAGNOSIS — I10 Essential (primary) hypertension: Secondary | ICD-10-CM | POA: Diagnosis not present

## 2017-05-26 DIAGNOSIS — E1122 Type 2 diabetes mellitus with diabetic chronic kidney disease: Secondary | ICD-10-CM | POA: Diagnosis not present

## 2017-05-26 DIAGNOSIS — I639 Cerebral infarction, unspecified: Secondary | ICD-10-CM | POA: Diagnosis not present

## 2017-05-26 NOTE — Assessment & Plan Note (Signed)
Controlled at home - daughter monitors it Will continue current medication at current doses Continue to monitor it at home Continue low sodium diet

## 2017-05-26 NOTE — Assessment & Plan Note (Addendum)
Lab Results  Component Value Date   HGBA1C 7.9 (H) 04/16/2017   Goal less than 8 Continue current medication Follow up in 6 months Has eye exam this week

## 2017-05-26 NOTE — Assessment & Plan Note (Signed)
Chronic, intermittent No obvious pattern Has loose stools ? Side effects from aricept or metformin - may need to temporary hold Has no change in symptoms since holding iron, but has only been a few days Has GI follow up

## 2017-05-26 NOTE — Patient Instructions (Addendum)
   All other Health Maintenance issues reviewed.   All recommended immunizations and age-appropriate screenings are up-to-date or discussed.  No immunizations administered today.   Medications reviewed and updated.  No changes recommended at this time.    Please followup in 6 months   

## 2017-05-30 DIAGNOSIS — H35033 Hypertensive retinopathy, bilateral: Secondary | ICD-10-CM | POA: Diagnosis not present

## 2017-05-30 DIAGNOSIS — H401231 Low-tension glaucoma, bilateral, mild stage: Secondary | ICD-10-CM | POA: Diagnosis not present

## 2017-05-30 DIAGNOSIS — H40051 Ocular hypertension, right eye: Secondary | ICD-10-CM | POA: Diagnosis not present

## 2017-05-30 DIAGNOSIS — E119 Type 2 diabetes mellitus without complications: Secondary | ICD-10-CM | POA: Diagnosis not present

## 2017-05-30 LAB — HM DIABETES EYE EXAM

## 2017-06-03 ENCOUNTER — Other Ambulatory Visit: Payer: Self-pay | Admitting: Endocrinology

## 2017-06-04 ENCOUNTER — Encounter: Payer: Self-pay | Admitting: Internal Medicine

## 2017-06-16 ENCOUNTER — Other Ambulatory Visit: Payer: Self-pay | Admitting: Endocrinology

## 2017-06-17 ENCOUNTER — Other Ambulatory Visit: Payer: Self-pay

## 2017-06-17 MED ORDER — METFORMIN HCL 500 MG PO TABS: ORAL_TABLET | ORAL | 0 refills | Status: DC

## 2017-06-19 ENCOUNTER — Other Ambulatory Visit: Payer: Self-pay | Admitting: Obstetrics and Gynecology

## 2017-06-19 DIAGNOSIS — Z1231 Encounter for screening mammogram for malignant neoplasm of breast: Secondary | ICD-10-CM

## 2017-06-23 ENCOUNTER — Other Ambulatory Visit (INDEPENDENT_AMBULATORY_CARE_PROVIDER_SITE_OTHER): Payer: Medicare Other

## 2017-06-23 ENCOUNTER — Telehealth: Payer: Self-pay

## 2017-06-23 ENCOUNTER — Ambulatory Visit (INDEPENDENT_AMBULATORY_CARE_PROVIDER_SITE_OTHER): Payer: Medicare Other | Admitting: Nurse Practitioner

## 2017-06-23 ENCOUNTER — Encounter: Payer: Self-pay | Admitting: Nurse Practitioner

## 2017-06-23 VITALS — BP 140/62 | HR 64 | Wt 111.4 lb

## 2017-06-23 DIAGNOSIS — R1031 Right lower quadrant pain: Secondary | ICD-10-CM | POA: Diagnosis not present

## 2017-06-23 DIAGNOSIS — R194 Change in bowel habit: Secondary | ICD-10-CM

## 2017-06-23 DIAGNOSIS — I639 Cerebral infarction, unspecified: Secondary | ICD-10-CM

## 2017-06-23 DIAGNOSIS — R197 Diarrhea, unspecified: Secondary | ICD-10-CM

## 2017-06-23 LAB — IRON AND TIBC
%SAT: 14 % (ref 11–50)
Iron: 39 ug/dL — ABNORMAL LOW (ref 45–160)
TIBC: 288 ug/dL (ref 250–450)
UIBC: 249 ug/dL

## 2017-06-23 LAB — CBC
HCT: 33.2 % — ABNORMAL LOW (ref 36.0–46.0)
Hemoglobin: 10.5 g/dL — ABNORMAL LOW (ref 12.0–15.0)
MCHC: 31.6 g/dL (ref 30.0–36.0)
MCV: 90 fl (ref 78.0–100.0)
Platelets: 245 10*3/uL (ref 150.0–400.0)
RBC: 3.69 Mil/uL — ABNORMAL LOW (ref 3.87–5.11)
RDW: 15.8 % — ABNORMAL HIGH (ref 11.5–15.5)
WBC: 5.4 10*3/uL (ref 4.0–10.5)

## 2017-06-23 LAB — BASIC METABOLIC PANEL WITH GFR
BUN: 17 mg/dL (ref 6–23)
CO2: 26 meq/L (ref 19–32)
Calcium: 9.4 mg/dL (ref 8.4–10.5)
Chloride: 105 meq/L (ref 96–112)
Creatinine, Ser: 0.71 mg/dL (ref 0.40–1.20)
GFR: 99.33 mL/min (ref >60.00)
Glucose, Bld: 141 mg/dL — ABNORMAL HIGH (ref 70–99)
Potassium: 3.4 meq/L — ABNORMAL LOW (ref 3.5–5.1)
Sodium: 140 meq/L (ref 135–145)

## 2017-06-23 LAB — FERRITIN: Ferritin: 31.9 ng/mL (ref 10.0–291.0)

## 2017-06-23 MED ORDER — NA SULFATE-K SULFATE-MG SULF 17.5-3.13-1.6 GM/177ML PO SOLN: ORAL | 0 refills | Status: DC

## 2017-06-23 NOTE — Progress Notes (Signed)
HPI: Patient is a 83 year old female who I saw on the tenth of last month for lower abdominal pain.  [name] Her main complaint was that of loose stools which she attributed to iron pills. Prior to submitting a stool sample for C. difficile (patient had been on Keflex) she wanted to try holding the iron. She is back with her daughter for follow-up. Still having several loose to mushy stools a day. Per daughter, she even has some nocturnal stools. Prior to 6 months ago patient averaged one formed BM a day. On Meformin for many years, on Aricept for many years as well. There has been no visible blood in her bowel movements. She has persistent lower abdominal pain, predominantly right lower quadrant. The pain is unrelated to movement or eating, it is unrelieved with defecation. Per daughter patient complains of the pain in the morning when she wakes up and periodically throughout the day. No urinary symptoms. She is status post remote hysterectomy. Her weight is 111 pounds today, down from 120 in early May though her average weight over the last year has been around 111 pounds.    Past Medical History:  Diagnosis Date  . Abdominal pain   . Allergic rhinitis, cause unspecified   . Anemia B twelve deficiency   . Arrhythmia    paroxysmal atrial fibrillation  . Arthritis    "arms and hands" (06/30/2013)  . Benign neoplasm of colon   . Colon polyp   . Coronary artery disease   . Degeneration of lumbar or lumbosacral intervertebral disc   . Dizziness - giddy   . Edema   . GERD (gastroesophageal reflux disease)   . Hyperlipidemia   . Hypertension   . Iron deficiency anemia, unspecified   . Lumbago   . Pernicious anemia   . Seizures ([place])   . Stroke ([employer])   . Symptomatic menopausal or female climacteric states   . Thoracic or lumbosacral neuritis or radiculitis, unspecified   . Type II diabetes mellitus ([facility])     Patient's surgical history, family medical history, social history, medications  and allergies were all reviewed in Epic    Physical Exam: BP 140/62   Pulse 64   Wt 111 lb 6.4 oz (50.5 kg)   BMI 21.76 kg/m   GENERAL: Pleasant, well developed black female in NAD PSYCH: :Pleasant, cooperative, normal affect EENT:  conjunctiva pink, mucous membranes moist, neck supple without masses CARDIAC:  RRR,no peripheral edema PULM: Normal respiratory effort, lungs CTA bilaterally, no wheezing ABDOMEN:  soft,  nondistended, no obvious masses,  normal bowel sounds SKIN:  turgor, no lesions seen Musculoskeletal:  Normal muscle tone, normal strength NEURO: Alert and oriented x 3, no focal neurologic deficits    ASSESSMENT and PLAN:  1. Pleasant 84 year old female [facility] of [facility] / personal hx of adenomatous colon polyps (most recently in 2011) here with persistent bowel changes and RLQ discomfort. Having several unformed to loose BMs a day. Seen in ED in April, noncontrast CT scan of the abdomen and pelvis showed no acute abnormalities. She has a bladder diverticulum but pain most predominant in RLQ where is tender on exam.-Daughter concerned about diminished quality of life with several month history of bowel changes and abdominal pain.  -Long discussion about next step in evaluation. Unable to obtain a CT scan WITH contrast due to allergies. We discussed colonoscopy including the risks and benefits of the procedure.. Patient is of advanced age increasing risks ot procedure but she  seems fairly independent. She and family would like to proceed with colonoscopic evaluation.  -c-diff seems unlikely given duration of diarrhea but patient did have Keflex a few months ago so need to check stool.     2. PAF / Hx of CVA in 2106. Chronic anticoagulation, on Eliquis. Hold Eliquis for 2 days before procedure - will instruct when and how to resume after procedure. Low but real risk of cardiovascular event such as heart attack, stroke, embolism, thrombosis or ischemia/infarct of other organs off   The patient consents to proceed. Will communicate by phone or EMR with patient's prescribing provider to confirm that holding Eliquis is reasonable in this case.    3. Multiple drug allergies  4. Chronic anemia. She stopped iron 4-6 months ago. Baseline hgb 10-11.  -cbc, tibc, ferritin.  -if hgb / iron studies stable will continue to hold iron for now   5. Dementia, stable. On Aricept.   6. ? Seizures. Evaluated by Neuro in May for chronic recurrent episodes of LOC and h/a. A 24 hour EEG was negative.    Tye Savoy , NP 06/23/2017, 9:04 AM

## 2017-06-23 NOTE — Patient Instructions (Signed)
If you are age 81 or older, your body mass index should be between 23-30. Your Body mass index is 21.76 kg/m. If this is out of the aforementioned range listed, please consider follow up with your Primary Care Provider.  If you are age 67 or younger, your body mass index should be between 19-25. Your Body mass index is 21.76 kg/m. If this is out of the aformentioned range listed, please consider follow up with your Primary Care Provider.   Your physician has requested that you go to the basement for lab work before leaving today.   You have been scheduled for a colonoscopy. Please follow written instructions given to you at your visit today.  Please pick up your prep supplies at the pharmacy within the next 1-3 days. If you use inhalers (even only as needed), please bring them with you on the day of your procedure. Your physician has requested that you go to www.startemmi.com and enter the access code given to you at your visit today. This web site gives a general overview about your procedure. However, you should still follow specific instructions given to you by our office regarding your preparation for the procedure.  You will be contacted by our office prior to your procedure for directions on holding your Eliquis.  If you do not hear from our office 1 week prior to your scheduled procedure, please call [phone] to discuss.   Thank you for choosing me and [facility].   Tye Savoy, NP

## 2017-06-23 NOTE — Telephone Encounter (Signed)
Pt can hold Eliquis 48 hours before her planned GI procedure.   Lauree Chandler 06/23/2017 1:51 PM

## 2017-06-23 NOTE — Telephone Encounter (Signed)
[facility] [name] [place], [place]  [address] Phone:  [phone]   Fax:  [phone]  06/23/2017   RE:      [name] DOB:   1925/09/02 MRN:   [id number]   Dear Dr. Angelena Form,    We have scheduled the above patient for an endoscopic procedure. Our records show that she is on anticoagulation therapy.   Please advise as to how long the patient may come off her therapy of Eliquis prior to the colonoscopy procedure, which is scheduled for 07/08/17.  Please fax back/ or route the completed form to [facility], [place] at [phone].   Sincerely,    Thurmon Fair, RMA

## 2017-06-24 ENCOUNTER — Other Ambulatory Visit: Payer: Medicare Other

## 2017-06-24 DIAGNOSIS — R197 Diarrhea, unspecified: Secondary | ICD-10-CM | POA: Diagnosis not present

## 2017-06-24 DIAGNOSIS — R194 Change in bowel habit: Secondary | ICD-10-CM

## 2017-06-24 NOTE — Telephone Encounter (Signed)
Spoke with patients daughter [name] and advised that per Dr. Angelena Form okay to hold Eliquis  48 hrs prior to procedure. Peter Congo, [place]

## 2017-06-25 LAB — CLOSTRIDIUM DIFFICILE BY PCR: Toxigenic C. Difficile by PCR: NOT DETECTED

## 2017-06-25 NOTE — Progress Notes (Signed)
[name], I have reviewed your thorough notes on this former Dr. Olevia Perches patient. I think consideration of colonoscopy is reasonable though I am concerned based on her advanced age and a prior history of stroke which she is on oral anticoagulation. She is set up for her exam in 2 weeks. Prior to proceeding with that, starting today, I would have her add Metamucil 2 tablespoons daily as a bulking agent(this may be enough to help her bowel habits). If that does not work, then hold metformin and Aricept for 1 week (I realize she has been on these for a while). If that does not work, we can proceed with colonoscopy with biopsies with the recommended handling of her anticoagulation per her prescribing and supervising anticoagulation physician. Thank you. Dr. Henrene Pastor

## 2017-06-30 ENCOUNTER — Telehealth: Payer: Self-pay

## 2017-06-30 NOTE — Telephone Encounter (Signed)
This patient is scheduled for a colonoscopy. Contacted the daughter, Allexa Acoff with the following message from Dr Henrene Pastor;  "Prior to proceeding with that, starting today, I would have her add Metamucil 2 tablespoons daily as a bulking agent(this may be enough to help her bowel habits" Expresses understanding of the plan. She agrees to try this for the patient.  Bobbye Riggs asks if the patient may resume iron supplement. She has been off of this for about 1 month. Taken off iron by Dr Loanne Drilling.

## 2017-07-02 NOTE — Telephone Encounter (Signed)
Daughter advised.

## 2017-07-02 NOTE — Telephone Encounter (Signed)
Would resume iron, please see comments on labs. Thanks

## 2017-07-08 ENCOUNTER — Encounter: Payer: Medicare Other | Admitting: Internal Medicine

## 2017-07-10 ENCOUNTER — Telehealth: Payer: Self-pay | Admitting: Nurse Practitioner

## 2017-07-10 NOTE — Telephone Encounter (Signed)
Spoke with the patient's daughter. The patient doesn't want to take Metamucil anymore, saying it doesn't work and makes her sick. She doesn't have any complaints of constipation. Off and on nausea. Treating with ginger ale, crackers. TUMS on occasion. Daughter will stay in touch with any issues.

## 2017-07-25 ENCOUNTER — Ambulatory Visit: Payer: Medicare Other

## 2017-07-28 ENCOUNTER — Emergency Department (HOSPITAL_COMMUNITY): Payer: Medicare Other

## 2017-07-28 ENCOUNTER — Encounter (HOSPITAL_COMMUNITY): Payer: Self-pay

## 2017-07-28 ENCOUNTER — Emergency Department (HOSPITAL_COMMUNITY)
Admission: EM | Admit: 2017-07-28 | Discharge: 2017-07-29 | Disposition: A | Discharge status: Home / Self Care | Payer: Medicare Other | Attending: Emergency Medicine | Admitting: Emergency Medicine

## 2017-07-28 DIAGNOSIS — Z7901 Long term (current) use of anticoagulants: Secondary | ICD-10-CM | POA: Insufficient documentation

## 2017-07-28 DIAGNOSIS — Z7984 Long term (current) use of oral hypoglycemic drugs: Secondary | ICD-10-CM | POA: Insufficient documentation

## 2017-07-28 DIAGNOSIS — Z79899 Other long term (current) drug therapy: Secondary | ICD-10-CM | POA: Diagnosis not present

## 2017-07-28 DIAGNOSIS — I2510 Atherosclerotic heart disease of native coronary artery without angina pectoris: Secondary | ICD-10-CM | POA: Insufficient documentation

## 2017-07-28 DIAGNOSIS — F17220 Nicotine dependence, chewing tobacco, uncomplicated: Secondary | ICD-10-CM | POA: Diagnosis not present

## 2017-07-28 DIAGNOSIS — E119 Type 2 diabetes mellitus without complications: Secondary | ICD-10-CM | POA: Diagnosis not present

## 2017-07-28 DIAGNOSIS — I10 Essential (primary) hypertension: Secondary | ICD-10-CM | POA: Diagnosis not present

## 2017-07-28 DIAGNOSIS — K5790 Diverticulosis of intestine, part unspecified, without perforation or abscess without bleeding: Secondary | ICD-10-CM | POA: Diagnosis not present

## 2017-07-28 DIAGNOSIS — R197 Diarrhea, unspecified: Secondary | ICD-10-CM | POA: Diagnosis not present

## 2017-07-28 DIAGNOSIS — G8929 Other chronic pain: Secondary | ICD-10-CM | POA: Insufficient documentation

## 2017-07-28 DIAGNOSIS — Z955 Presence of coronary angioplasty implant and graft: Secondary | ICD-10-CM | POA: Diagnosis not present

## 2017-07-28 DIAGNOSIS — R1031 Right lower quadrant pain: Secondary | ICD-10-CM | POA: Insufficient documentation

## 2017-07-28 LAB — COMPREHENSIVE METABOLIC PANEL
ALT: 14 U/L (ref 14–54)
AST: 23 U/L (ref 15–41)
Albumin: 4 g/dL (ref 3.5–5.0)
Alkaline Phosphatase: 79 U/L (ref 38–126)
Anion gap: 10 (ref 5–15)
BUN: 16 mg/dL (ref 6–20)
CO2: 27 mmol/L (ref 22–32)
Calcium: 9.3 mg/dL (ref 8.9–10.3)
Chloride: 103 mmol/L (ref 101–111)
Creatinine, Ser: 0.77 mg/dL (ref 0.44–1.00)
GFR calc Af Amer: >60 mL/min (ref >60)
GFR calc non Af Amer: >60 mL/min (ref >60)
Glucose, Bld: 162 mg/dL — ABNORMAL HIGH (ref 65–99)
Potassium: 3.4 mmol/L — ABNORMAL LOW (ref 3.5–5.1)
Sodium: 140 mmol/L (ref 135–145)
Total Bilirubin: 0.9 mg/dL (ref 0.3–1.2)
Total Protein: 7.4 g/dL (ref 6.5–8.1)

## 2017-07-28 LAB — URINALYSIS, ROUTINE W REFLEX MICROSCOPIC
Bacteria, UA: NONE SEEN
Bilirubin Urine: NEGATIVE
Glucose, UA: NEGATIVE mg/dL
Ketones, ur: NEGATIVE mg/dL
Nitrite: NEGATIVE
Protein, ur: 30 mg/dL — AB
Specific Gravity, Urine: 1.01 (ref 1.005–1.030)
pH: 6 (ref 5.0–8.0)

## 2017-07-28 LAB — CBC
HCT: 31.8 % — ABNORMAL LOW (ref 36.0–46.0)
Hemoglobin: 10.2 g/dL — ABNORMAL LOW (ref 12.0–15.0)
MCH: 28 pg (ref 26.0–34.0)
MCHC: 32.1 g/dL (ref 30.0–36.0)
MCV: 87.4 fL (ref 78.0–100.0)
Platelets: 298 10*3/uL (ref 150–400)
RBC: 3.64 MIL/uL — ABNORMAL LOW (ref 3.87–5.11)
RDW: 14.5 % (ref 11.5–15.5)
WBC: 6.3 10*3/uL (ref 4.0–10.5)

## 2017-07-28 LAB — LIPASE, BLOOD: Lipase: 54 U/L — ABNORMAL HIGH (ref 11–51)

## 2017-07-28 NOTE — ED Triage Notes (Signed)
Patient c/o abdominal pain x months. GI physician did not do a colonoscopy due to age.  Patient also c/o nausea and diarrhea after eating for weeks.

## 2017-07-28 NOTE — ED Provider Notes (Signed)
[facility] DEPT Provider Note   CSN: [id number] Arrival date & time: 07/28/17  1618     History   Chief Complaint Chief Complaint  Patient presents with  . Abdominal Pain  . Nausea  . Diarrhea    HPI [name] is a 83 y.o. female.  The history is provided by the patient.  Abdominal Pain   This is a chronic problem. Episode onset: 6 months. Episode frequency: intermittently but frequent. The problem has been gradually worsening. The pain is associated with an unknown factor. The pain is located in the RLQ. The pain is moderate. Pertinent negatives include anorexia, fever, diarrhea, hematochezia, melena, nausea, vomiting, constipation, dysuria and frequency. The symptoms are aggravated by certain positions. Nothing relieves the symptoms.  Diarrhea   Associated symptoms include abdominal pain. Pertinent negatives include no vomiting.    Past Medical History:  Diagnosis Date  . Abdominal pain   . Allergic rhinitis, cause unspecified   . Anemia B twelve deficiency   . Arrhythmia    paroxysmal atrial fibrillation  . Arthritis    "arms and hands" (06/30/2013)  . Benign neoplasm of colon   . Colon polyp   . Coronary artery disease   . Degeneration of lumbar or lumbosacral intervertebral disc   . Dizziness - giddy   . Edema   . GERD (gastroesophageal reflux disease)   . Hyperlipidemia   . Hypertension   . Iron deficiency anemia, unspecified   . Lumbago   . Pernicious anemia   . Seizures ([place])   . Stroke ([place])   . Symptomatic menopausal or female climacteric states   . Thoracic or lumbosacral neuritis or radiculitis, unspecified   . Type II diabetes mellitus [facility])     Patient Active Problem List   Diagnosis Date Noted  . Moderate dementia with behavioral disturbance 03/21/2017  . Cerebral infarction ([place]) 12/27/2015  . Convulsions ([place]) 09/11/2015  . SVT (supraventricular tachycardia) ([place])   . Fall 08/21/2015  . PAF (paroxysmal atrial fibrillation) ([place])    . Stroke (cerebrum) ([place]) 08/20/2015  . Aphasia   . TIA (transient ischemic attack)   . FTT (failure to thrive) in adult   . Memory loss 02/24/2015  . Syncope 10/31/2014  . Diabetes mellitus type 2, controlled ([place]) 10/31/2014  . Uncontrolled hypertension 10/31/2014  . Headache 11/26/2013  . CAD, NATIVE VESSEL 02/14/2010  . GERD 02/14/2010  . ANEMIA-B12 DEFICIENCY 01/01/2010  . OSTEOARTHRITIS 01/01/2010  . PERSONAL HX COLONIC POLYPS 01/01/2010  . Iron deficiency anemia 12/28/2009  . DIZZINESS AND GIDDINESS 12/28/2009  . Lower abdominal pain 09/13/2009  . Chronic lower back pain 08/11/2008  . KNEE PAIN, RIGHT 02/11/2008  . Dyslipidemia 04/23/2007  . Allergic rhinitis 04/23/2007  . GALLBLADDER DISEASE 04/23/2007  . DIVERTICULITIS, HX OF 04/23/2007    Past Surgical History:  Procedure Laterality Date  . ABDOMINAL HYSTERECTOMY  1974  . APPENDECTOMY  1974  . CARDIAC CATHETERIZATION    . CARPAL TUNNEL RELEASE Bilateral 1970's  . CATARACT EXTRACTION, BILATERAL Bilateral   . CHOLECYSTECTOMY    . CORONARY ANGIOPLASTY WITH STENT PLACEMENT     "1" (06/30/2013  . LUMBAR DISC SURGERY      OB History    No data available       Home Medications    Prior to Admission medications   Medication Sig Start Date End Date Taking? Authorizing Provider  amLODipine (NORVASC) 5 MG tablet Take 1 tablet (5 mg total) by mouth daily. 10/16/16  Yes Burnell Blanks,  MD  apixaban (ELIQUIS) 2.5 MG TABS tablet Take 1 tablet (2.5 mg total) by mouth 2 (two) times daily. 10/16/16  Yes Burnell Blanks, MD  atorvastatin (LIPITOR) 40 MG tablet Take 1 tablet (40 mg total) by mouth daily. 10/16/16  Yes Burnell Blanks, MD  donepezil (ARICEPT) 10 MG tablet Take 1 tablet (10 mg total) by mouth at bedtime. 03/17/17  Yes Cameron Sprang, MD  KLOR-CON 10 10 MEQ tablet TAKE 1 TABLET BY MOUTH EVERY DAY 04/08/17  Yes Burnell Blanks, MD  metFORMIN (GLUCOPHAGE) 500 MG tablet TAKE 1 TABLET  BY MOUTH TWICE DAILY WITH MEALS (NEEDS  APPOINTMENT) 06/17/17  Yes Renato Shin, MD  metoprolol succinate (TOPROL-XL) 50 MG 24 hr tablet TAKE 1 TABLET BY MOUTH DAILY. TAKE WITH OR IMMEDIATELY FOLLOWING A MEAL. 10/16/16  Yes Burnell Blanks, MD  omeprazole (PRILOSEC) 40 MG capsule TAKE 1 CAPSULE (40 MG TOTAL) BY MOUTH DAILY. 06/03/17  Yes Renato Shin, MD  divalproex (DEPAKOTE ER) 250 MG 24 hr tablet Take 1 tablet (250 mg total) by mouth daily. Patient not taking: Reported on 07/28/2017 03/17/17   Cameron Sprang, MD  glucose blood (ONE TOUCH ULTRA TEST) test strip Use to check blood sugar 1 time per day. Dx Code: E11.9 01/13/17   Renato Shin, MD  Na Sulfate-K Sulfate-Mg Sulf 17.5-3.13-1.6 GM/180ML SOLN Suprep-Use as directed 06/23/17   Willia Craze, NP  nitroGLYCERIN (NITROSTAT) 0.4 MG SL tablet Place 0.4 mg under the tongue every 5 (five) minutes as needed for chest pain. Reported on 12/21/2015    [provider]  ondansetron (ZOFRAN ODT) 8 MG disintegrating tablet Take 1 tablet (8 mg total) by mouth every 8 (eight) hours as needed for nausea or vomiting. Patient not taking: Reported on 07/28/2017 02/25/17   Jola Schmidt, MD  [facility] DELICA LANCETS 76H MISC Use 1 lancet to check glucose once daily 01/15/17   Binnie Rail, MD  OVER THE COUNTER MEDICATION Place 1 patch onto the skin daily as needed (pain). Arthritis pain reliever patch    [provider]    Family History Family History  Problem Relation Age of Onset  . Breast cancer Sister        "behind heart"  . Colon cancer Sister   . Colon cancer Sister   . Breast cancer Daughter   . Breast cancer Daughter   . Breast cancer Daughter   . Kidney cancer Daughter     Social History Social History  Substance Use Topics  . Smoking status: Never Smoker  . Smokeless tobacco: Current User    Types: Snuff  . Alcohol use No     Allergies   Clindamycin; Iodides; Iodinated diagnostic agents; Penicillins; Sulfa  antibiotics; Sulfonamide derivatives; Albumin (human); Povidone iodine; Celecoxib; Erythromycin; Nitrofuran derivatives; Nitrofurantoin; Piroxicam; and Povidone-iodine   Review of Systems Review of Systems  Constitutional: Negative for fever.  Gastrointestinal: Positive for abdominal pain. Negative for anorexia, constipation, diarrhea, hematochezia, melena, nausea and vomiting.  Genitourinary: Negative for dysuria and frequency.  All other systems are reviewed and are negative for acute change except as noted in the HPI    Physical Exam Updated Vital Signs BP (!) 176/90 (BP Location: Left Arm)   Pulse 69   Temp 98.3 F (36.8 C) (Oral)   Resp 11   Ht 5\' 1"  (1.549 m)   Wt 50.3 kg (111 lb)   SpO2 100%   BMI 20.97 kg/m   Physical Exam  Constitutional: She is  oriented to person, place, and time. She appears well-developed and well-nourished. No distress.  HENT:  Head: Normocephalic and atraumatic.  Nose: Nose normal.  Eyes: Pupils are equal, round, and reactive to light. Conjunctivae and EOM are normal. Right eye exhibits no discharge. Left eye exhibits no discharge. No scleral icterus.  Neck: Normal range of motion. Neck supple.  Cardiovascular: Normal rate and regular rhythm.  Exam reveals no gallop and no friction rub.   No murmur heard. Pulmonary/Chest: Effort normal and breath sounds normal. No stridor. No respiratory distress. She has no rales.  Abdominal: Soft. She exhibits no distension. There is tenderness in the right lower quadrant. There is no rigidity, no guarding and negative Murphy's sign.  Musculoskeletal: She exhibits no edema or tenderness.  Neurological: She is alert and oriented to person, place, and time.  Skin: Skin is warm and dry. No rash noted. She is not diaphoretic. No erythema.  Psychiatric: She has a normal mood and affect.  Vitals reviewed.    ED Treatments / Results  Labs (all labs ordered are listed, but only abnormal results are  displayed) Labs Reviewed  LIPASE, BLOOD - Abnormal; Notable for the following:       Result Value   Lipase 54 (*)    All other components within normal limits  COMPREHENSIVE METABOLIC PANEL - Abnormal; Notable for the following:    Potassium 3.4 (*)    Glucose, Bld 162 (*)    All other components within normal limits  CBC - Abnormal; Notable for the following:    RBC 3.64 (*)    Hemoglobin 10.2 (*)    HCT 31.8 (*)    All other components within normal limits  URINALYSIS, ROUTINE W REFLEX MICROSCOPIC - Abnormal; Notable for the following:    Hgb urine dipstick SMALL (*)    Protein, ur 30 (*)    Leukocytes, UA SMALL (*)    Squamous Epithelial / LPF 0-5 (*)    All other components within normal limits    EKG  EKG Interpretation None       Radiology Ct Abdomen Pelvis Wo Contrast  Result Date: 07/28/2017 CLINICAL DATA:  Unspecified chronic abdominal pain, nausea, diarrhea EXAM: CT ABDOMEN AND PELVIS WITHOUT CONTRAST TECHNIQUE: Multidetector CT imaging of the abdomen and pelvis was performed following the standard protocol without IV contrast. COMPARISON:  02/25/2017 FINDINGS: Lower chest: Minor basilar atelectasis versus scarring. Normal heart size. Coronary atherosclerosis noted. Hepatobiliary: Remote cholecystectomy. No focal hepatic abnormality or intrahepatic biliary dilatation within the limits of noncontrast imaging. Biliary system unremarkable. Pancreas: Unremarkable. No pancreatic ductal dilatation or surrounding inflammatory changes. Spleen: Normal in size without focal abnormality. Adrenals/Urinary Tract: Normal adrenal glands. No renal obstruction or hydronephrosis. Suspect stable parapelvic cyst in the left kidney inferiorly measuring 3.4 cm. No obstructing ureteral calculus, hydroureter, or definite bladder abnormality. Chronic right posterior bladder diverticulum again noted. Stomach/Bowel: Negative for bowel obstruction, significant dilatation, ileus, or free air. Appendix  not visualized. Scattered colonic diverticulosis without acute inflammatory process or wall thickening. No fluid collection or abscess. Vascular/Lymphatic: Atherosclerosis of aorta. Negative for aneurysm. No retroperitoneal hemorrhage or acute process. No adenopathy. Reproductive: Remote hysterectomy. No adnexal mass or pelvic fluid collection. Other: No inguinal abnormality. Intact abdominal wall. No ascites or free fluid. Musculoskeletal: Bones are osteopenic. Degenerative changes of the spine and pelvis. No acute osseous finding. IMPRESSION: No acute intra abdominal or pelvic finding by noncontrast CT. Diverticulosis without acute inflammatory process Aortic atherosclerosis without aneurysm Electronically Signed   By:  M.  Shick M.D.   On: 07/28/2017 22:26    Procedures Procedures (including critical care time)  Medications Ordered in ED Medications - No data to display   Initial Impression / Assessment and Plan / ED Course  I have reviewed the triage vital signs and the nursing notes.  Pertinent labs & imaging results that were available during my care of the patient were reviewed by me and considered in my medical decision making (see chart for details).     Abd pain. Not peritonitic. Work up unremarkable including CT. Recommended PCP follow up.  The patient is safe for discharge with strict return precautions.   Final Clinical Impressions(s) / ED Diagnoses   Final diagnoses:  Chronic RLQ pain   Disposition: Discharge  Condition: Good  I have discussed the results, Dx and Tx plan with the patient who expressed understanding and agree(s) with the plan. Discharge instructions discussed at great length. The patient was given strict return precautions who verbalized understanding of the instructions. No further questions at time of discharge.    New Prescriptions   No medications on file    Follow Up: Binnie Rail, MD [place]  [id number] [phone]  Schedule an appointment as soon as possible for a visit  As needed      Fatima Blank, MD 07/28/17 [id number]

## 2017-08-05 ENCOUNTER — Ambulatory Visit (INDEPENDENT_AMBULATORY_CARE_PROVIDER_SITE_OTHER): Payer: Medicare Other | Admitting: General Practice

## 2017-08-05 DIAGNOSIS — Z23 Encounter for immunization: Secondary | ICD-10-CM | POA: Diagnosis not present

## 2017-08-21 ENCOUNTER — Other Ambulatory Visit: Payer: Self-pay | Admitting: *Deleted

## 2017-08-21 MED ORDER — OMEPRAZOLE 40 MG PO CPDR: 40.0000 mg | DELAYED_RELEASE_CAPSULE | Freq: Every day | ORAL | 0 refills | Status: DC

## 2017-08-21 NOTE — Telephone Encounter (Signed)
Requesting 90 day supply.

## 2017-09-01 DIAGNOSIS — H40051 Ocular hypertension, right eye: Secondary | ICD-10-CM | POA: Diagnosis not present

## 2017-09-01 DIAGNOSIS — H401231 Low-tension glaucoma, bilateral, mild stage: Secondary | ICD-10-CM | POA: Diagnosis not present

## 2017-09-10 ENCOUNTER — Ambulatory Visit (INDEPENDENT_AMBULATORY_CARE_PROVIDER_SITE_OTHER): Payer: Medicare Other | Admitting: Podiatry

## 2017-09-10 VITALS — BP 172/88 | HR 70

## 2017-09-10 DIAGNOSIS — I639 Cerebral infarction, unspecified: Secondary | ICD-10-CM | POA: Diagnosis not present

## 2017-09-10 DIAGNOSIS — M79675 Pain in left toe(s): Secondary | ICD-10-CM | POA: Diagnosis not present

## 2017-09-10 DIAGNOSIS — M79674 Pain in right toe(s): Secondary | ICD-10-CM | POA: Diagnosis not present

## 2017-09-10 DIAGNOSIS — E119 Type 2 diabetes mellitus without complications: Secondary | ICD-10-CM | POA: Diagnosis not present

## 2017-09-10 DIAGNOSIS — B351 Tinea unguium: Secondary | ICD-10-CM

## 2017-09-10 DIAGNOSIS — D689 Coagulation defect, unspecified: Secondary | ICD-10-CM | POA: Diagnosis not present

## 2017-09-10 NOTE — Progress Notes (Signed)
   Subjective:    Patient ID: [name], female    DOB: Feb 27, 1925, 83 y.o.   MRN: [id number]  HPI this patient presents the office with chief complaint of long thick painful nails.  These nails are painful walking and wearing her shoes.  She is unable to self treat.  This patient is diabetic and is taking eliquiss. She presents the office today for preventative foot care services    Review of Systems  All other systems reviewed and are negative.      Objective:   Physical Exam General Appearance  Alert, conversant and in no acute stress.  Vascular  Dorsalis pedis and posterior pulses are palpable  bilaterally.  Capillary return is within normal limits  Bilaterally. Temperature is within normal limits  Bilaterally  Neurologic  Senn-Weinstein monofilament wire test within normal limits  bilaterally. Muscle power  Within normal limits bilaterally.  Nails Thick disfigured discolored nails with subungual debride bilaterally from hallux to fifth toes bilaterally. No evidence of bacterial infection or drainage bilaterally.  Orthopedic  No limitations of motion of motion feet bilaterally.  No crepitus or effusions noted.  HAV 1st MPJ  B/L.  Dorsal DJD B/L.  Skin  normotropic skin with no porokeratosis noted bilaterally.  No signs of infections or ulcers noted.          Assessment & Plan:  Onychomycosis x 10  Diabetes with no foot complications.   IE  Debride nails  X 10.  RTC 3 months   Gardiner Barefoot DPM

## 2017-09-17 ENCOUNTER — Other Ambulatory Visit: Payer: Self-pay | Admitting: Endocrinology

## 2017-09-18 ENCOUNTER — Emergency Department (HOSPITAL_COMMUNITY): Payer: Medicare Other

## 2017-09-18 ENCOUNTER — Encounter (HOSPITAL_COMMUNITY): Payer: Self-pay | Admitting: *Deleted

## 2017-09-18 ENCOUNTER — Other Ambulatory Visit: Payer: Self-pay

## 2017-09-18 ENCOUNTER — Emergency Department (HOSPITAL_COMMUNITY)
Admission: EM | Admit: 2017-09-18 | Discharge: 2017-09-18 | Disposition: A | Discharge status: Home / Self Care | Payer: Medicare Other | Attending: Emergency Medicine | Admitting: Emergency Medicine

## 2017-09-18 DIAGNOSIS — F0391 Unspecified dementia with behavioral disturbance: Secondary | ICD-10-CM

## 2017-09-18 DIAGNOSIS — I2510 Atherosclerotic heart disease of native coronary artery without angina pectoris: Secondary | ICD-10-CM | POA: Diagnosis not present

## 2017-09-18 DIAGNOSIS — E119 Type 2 diabetes mellitus without complications: Secondary | ICD-10-CM | POA: Diagnosis not present

## 2017-09-18 DIAGNOSIS — Y929 Unspecified place or not applicable: Secondary | ICD-10-CM | POA: Diagnosis not present

## 2017-09-18 DIAGNOSIS — F0390 Unspecified dementia without behavioral disturbance: Secondary | ICD-10-CM | POA: Diagnosis not present

## 2017-09-18 DIAGNOSIS — S4992XA Unspecified injury of left shoulder and upper arm, initial encounter: Secondary | ICD-10-CM | POA: Diagnosis not present

## 2017-09-18 DIAGNOSIS — I10 Essential (primary) hypertension: Secondary | ICD-10-CM | POA: Insufficient documentation

## 2017-09-18 DIAGNOSIS — R0789 Other chest pain: Secondary | ICD-10-CM | POA: Insufficient documentation

## 2017-09-18 DIAGNOSIS — Y999 Unspecified external cause status: Secondary | ICD-10-CM | POA: Diagnosis not present

## 2017-09-18 DIAGNOSIS — Z79899 Other long term (current) drug therapy: Secondary | ICD-10-CM | POA: Diagnosis not present

## 2017-09-18 DIAGNOSIS — Y9301 Activity, walking, marching and hiking: Secondary | ICD-10-CM | POA: Insufficient documentation

## 2017-09-18 DIAGNOSIS — F03B18 Unspecified dementia, moderate, with other behavioral disturbance: Secondary | ICD-10-CM

## 2017-09-18 DIAGNOSIS — X509XXA Other and unspecified overexertion or strenuous movements or postures, initial encounter: Secondary | ICD-10-CM | POA: Diagnosis not present

## 2017-09-18 DIAGNOSIS — W19XXXA Unspecified fall, initial encounter: Secondary | ICD-10-CM

## 2017-09-18 DIAGNOSIS — M25512 Pain in left shoulder: Secondary | ICD-10-CM | POA: Diagnosis present

## 2017-09-18 MED ORDER — ACETAMINOPHEN 500 MG PO TABS
1000.0000 mg | ORAL_TABLET | Freq: Once | ORAL | Status: AC
  Administered 2017-09-18: 1000 mg via ORAL
  Filled 2017-09-18: qty 2

## 2017-09-18 NOTE — ED Triage Notes (Signed)
Pt complains of left shoulder pain since falling today. Pt tripped on her feet and fell on concrete, on her right side. Pt did not lose consciousness.

## 2017-09-18 NOTE — ED Provider Notes (Signed)
[facility] DEPT Provider Note   CSN: [id number] Arrival date & time: 09/18/17  1602     History   Chief Complaint Chief Complaint  Patient presents with  . Fall  . Shoulder Pain    left    HPI [name] is a 83 y.o. female.  83 yo F with a chief complaint of a fall.  The patient has difficulty with ambulating at baseline and was being walked to the door with assistance when she lost her footing and fell onto the left shoulder.  They deny head injury or loss of consciousness.  Denies neck or back pain.  Denies chest pain abdominal pain lower extremity pain.  Denies shortness of breath.  Sharp pain to the posterior aspect of the left shoulder.  Worse with range of motion.  Worse with palpation.  Denies radiation.   The history is provided by the patient.  Fall  This is a new problem. The current episode started 1 to 2 hours ago. The problem occurs constantly. The problem has been resolved. Pertinent negatives include no chest pain, no headaches and no shortness of breath. The symptoms are aggravated by bending and twisting. Nothing relieves the symptoms. She has tried nothing for the symptoms. The treatment provided no relief.  Shoulder Pain      Past Medical History:  Diagnosis Date  . Abdominal pain   . Allergic rhinitis, cause unspecified   . Anemia B twelve deficiency   . Arrhythmia    paroxysmal atrial fibrillation  . Arthritis    "arms and hands" (06/30/2013)  . Benign neoplasm of colon   . Colon polyp   . Coronary artery disease   . Degeneration of lumbar or lumbosacral intervertebral disc   . Dizziness - giddy   . Edema   . GERD (gastroesophageal reflux disease)   . Hyperlipidemia   . Hypertension   . Iron deficiency anemia, unspecified   . Lumbago   . Pernicious anemia   . Seizures ([place])   . Stroke ([place])   . Symptomatic menopausal or female climacteric states   . Thoracic or lumbosacral neuritis or radiculitis,  unspecified   . Type II diabetes mellitus [facility])     Patient Active Problem List   Diagnosis Date Noted  . Moderate dementia with behavioral disturbance 03/21/2017  . Cerebral infarction ([place]) 12/27/2015  . Convulsions ([place]) 09/11/2015  . SVT (supraventricular tachycardia) ([place])   . Fall 08/21/2015  . PAF (paroxysmal atrial fibrillation) ([place])   . Stroke (cerebrum) ([place]) 08/20/2015  . Aphasia   . TIA (transient ischemic attack)   . FTT (failure to thrive) in adult   . Memory loss 02/24/2015  . Syncope 10/31/2014  . Diabetes mellitus type 2, controlled ([place]) 10/31/2014  . Uncontrolled hypertension 10/31/2014  . Headache 11/26/2013  . CAD, NATIVE VESSEL 02/14/2010  . GERD 02/14/2010  . ANEMIA-B12 DEFICIENCY 01/01/2010  . OSTEOARTHRITIS 01/01/2010  . PERSONAL HX COLONIC POLYPS 01/01/2010  . Iron deficiency anemia 12/28/2009  . DIZZINESS AND GIDDINESS 12/28/2009  . Lower abdominal pain 09/13/2009  . Chronic lower back pain 08/11/2008  . KNEE PAIN, RIGHT 02/11/2008  . Dyslipidemia 04/23/2007  . Allergic rhinitis 04/23/2007  . GALLBLADDER DISEASE 04/23/2007  . DIVERTICULITIS, HX OF 04/23/2007    Past Surgical History:  Procedure Laterality Date  . ABDOMINAL HYSTERECTOMY  1974  . APPENDECTOMY  1974  . CARDIAC CATHETERIZATION    . CARPAL TUNNEL RELEASE Bilateral 1970's  . CATARACT EXTRACTION, BILATERAL Bilateral   .  CHOLECYSTECTOMY    . CORONARY ANGIOPLASTY WITH STENT PLACEMENT     "1" (06/30/2013  . LUMBAR DISC SURGERY      OB History    No data available       Home Medications    Prior to Admission medications   Medication Sig Start Date End Date Taking? Authorizing Provider  amLODipine (NORVASC) 5 MG tablet Take 1 tablet (5 mg total) by mouth daily. 10/16/16  Yes Burnell Blanks, MD  apixaban (ELIQUIS) 2.5 MG TABS tablet Take 1 tablet (2.5 mg total) by mouth 2 (two) times daily. 10/16/16  Yes Burnell Blanks, MD  atorvastatin (LIPITOR) 40 MG tablet  Take 1 tablet (40 mg total) by mouth daily. 10/16/16  Yes Burnell Blanks, MD  donepezil (ARICEPT) 10 MG tablet Take 1 tablet (10 mg total) by mouth at bedtime. 03/17/17  Yes Cameron Sprang, MD  KLOR-CON 10 10 MEQ tablet TAKE 1 TABLET BY MOUTH EVERY DAY 04/08/17  Yes Burnell Blanks, MD  metFORMIN (GLUCOPHAGE) 500 MG tablet TAKE 1 TABLET BY MOUTH TWICE DAILY WITH MEALS (NEEDS  APPOINTMENT) 09/18/17  Yes Renato Shin, MD  metoprolol succinate (TOPROL-XL) 50 MG 24 hr tablet TAKE 1 TABLET BY MOUTH DAILY. TAKE WITH OR IMMEDIATELY FOLLOWING A MEAL. 10/16/16  Yes Burnell Blanks, MD  divalproex (DEPAKOTE ER) 250 MG 24 hr tablet Take 1 tablet (250 mg total) by mouth daily. Patient not taking: Reported on 09/18/2017 03/17/17   Cameron Sprang, MD  glucose blood (ONE TOUCH ULTRA TEST) test strip Use to check blood sugar 1 time per day. Dx Code: E11.9 01/13/17   Renato Shin, MD  Na Sulfate-K Sulfate-Mg Sulf 17.5-3.13-1.6 GM/180ML SOLN Suprep-Use as directed 06/23/17   Willia Craze, NP  nitroGLYCERIN (NITROSTAT) 0.4 MG SL tablet Place 0.4 mg under the tongue every 5 (five) minutes as needed for chest pain. Reported on 12/21/2015    [provider]  omeprazole (PRILOSEC) 40 MG capsule Take 1 capsule (40 mg total) by mouth daily. Patient not taking: Reported on 09/18/2017 08/21/17   Renato Shin, MD  ondansetron (ZOFRAN ODT) 8 MG disintegrating tablet Take 1 tablet (8 mg total) by mouth every 8 (eight) hours as needed for nausea or vomiting. 02/25/17   Jola Schmidt, MD  [facility] DELICA LANCETS 29B MISC Use 1 lancet to check glucose once daily 01/15/17   Binnie Rail, MD  OVER THE COUNTER MEDICATION Place 1 patch onto the skin daily as needed (pain). Arthritis pain reliever patch    [provider]    Family History Family History  Problem Relation Age of Onset  . Breast cancer Sister        "behind heart"  . Colon cancer Sister   . Colon cancer Sister   . Breast cancer  Daughter   . Breast cancer Daughter   . Breast cancer Daughter   . Kidney cancer Daughter     Social History Social History   Tobacco Use  . Smoking status: Never Smoker  . Smokeless tobacco: Current User    Types: Snuff  Substance Use Topics  . Alcohol use: No    Alcohol/week: 0.0 oz  . Drug use: No     Allergies   Clindamycin; Iodides; Iodinated diagnostic agents; Penicillins; Sulfa antibiotics; Sulfonamide derivatives; Albumin (human); Povidone iodine; Celecoxib; Erythromycin; Nitrofuran derivatives; Nitrofurantoin; Piroxicam; and Povidone-iodine   Review of Systems Review of Systems  Constitutional: Negative for chills and fever.  HENT: Negative for congestion  and rhinorrhea.   Eyes: Negative for redness and visual disturbance.  Respiratory: Negative for shortness of breath and wheezing.   Cardiovascular: Negative for chest pain and palpitations.  Gastrointestinal: Negative for nausea and vomiting.  Genitourinary: Negative for dysuria and urgency.  Musculoskeletal: Positive for arthralgias. Negative for myalgias.  Skin: Negative for pallor and wound.  Neurological: Negative for dizziness and headaches.     Physical Exam Updated Vital Signs BP (!) 159/69 (BP Location: Left Arm)   Pulse 67   Temp 98.3 F (36.8 C) (Oral)   Resp 16   Ht 5\' 1"  (1.549 m)   Wt 50.3 kg (111 lb)   SpO2 99%   BMI 20.97 kg/m   Physical Exam  Constitutional: She is oriented to person, place, and time. She appears well-developed and well-nourished. No distress.  HENT:  Head: Normocephalic and atraumatic.  Eyes: EOM are normal. Pupils are equal, round, and reactive to light.  Neck: Normal range of motion. Neck supple.  Cardiovascular: Normal rate and regular rhythm. Exam reveals no gallop and no friction rub.  No murmur heard. Pulmonary/Chest: Effort normal. She has no wheezes. She has no rales. She exhibits tenderness.  Tenderness to palpation of the left chest wall.    Abdominal:  Soft. She exhibits no distension. There is no tenderness.  Musculoskeletal: She exhibits tenderness. She exhibits no edema.  Tenderness to the posterior aspect of the left shoulder.  Pulse motor and sensation is intact distally.  No noted bony tenderness.  Palpated from head to toe without any other noted areas of bony tenderness.  Neurological: She is alert and oriented to person, place, and time.  Skin: Skin is warm and dry. She is not diaphoretic.  Psychiatric: She has a normal mood and affect. Her behavior is normal.  Nursing note and vitals reviewed.    ED Treatments / Results  Labs (all labs ordered are listed, but only abnormal results are displayed) Labs Reviewed - No data to display  EKG  EKG Interpretation None       Radiology Dg Ribs Unilateral W/chest Left  Result Date: 09/18/2017 CLINICAL DATA:  Left chest wall pain EXAM: LEFT RIBS AND CHEST - 3+ VIEW COMPARISON:  09/22/2016 FINDINGS: Single-view chest demonstrates low lung volumes. No acute consolidation or effusion. No pneumothorax. Normal heart size. Aortic atherosclerosis. Surgical clips in the right upper quadrant. Left rib series demonstrates no acute displaced rib fracture. Vascular calcification over the left breast. IMPRESSION: 1. Negative for pneumothorax or pleural effusion 2. No acute displaced left rib fracture Electronically Signed   By: Donavan Foil M.D.   On: 09/18/2017 19:31   Dg Shoulder Left  Result Date: 09/18/2017 CLINICAL DATA:  Fall with shoulder pain EXAM: LEFT SHOULDER - 2+ VIEW COMPARISON:  None. FINDINGS: No fracture or dislocation. Mild AC joint degenerative change. Subacromial spurring. Left lung apex is clear. IMPRESSION: No acute osseous abnormality Electronically Signed   By: Donavan Foil M.D.   On: 09/18/2017 18:04    Procedures Procedures (including critical care time)  Medications Ordered in ED Medications  acetaminophen (TYLENOL) tablet 1,000 mg (1,000 mg Oral Given 09/18/17  1838)     Initial Impression / Assessment and Plan / ED Course  I have reviewed the triage vital signs and the nursing notes.  Pertinent labs & imaging results that were available during my care of the patient were reviewed by me and considered in my medical decision making (see chart for details).     83 yo  F with a chief complaint of a mechanical fall.  Complaining of left shoulder pain.  X-ray is negative.  Patient actually has quite a bit of left chest wall tenderness.  Will obtain a plain film. Negative.  D/c home.   10:09 PM:  I have discussed the diagnosis/risks/treatment options with the patient and family and believe the pt to be eligible for discharge home to follow-up with PCP. We also discussed returning to the ED immediately if new or worsening sx occur. We discussed the sx which are most concerning (e.g., sudden worsening pain, fever, inability to tolerate by mouth) that necessitate immediate return. Medications administered to the patient during their visit and any new prescriptions provided to the patient are listed below.  Medications given during this visit Medications  acetaminophen (TYLENOL) tablet 1,000 mg (1,000 mg Oral Given 09/18/17 1838)     The patient appears reasonably screen and/or stabilized for discharge and I doubt any other medical condition or other [facility] requiring further screening, evaluation, or treatment in the ED at this time prior to discharge.    Final Clinical Impressions(s) / ED Diagnoses   Final diagnoses:  Acute pain of left shoulder  Left-sided chest wall pain  Fall, initial encounter    ED Discharge Orders    None       Deno Etienne, DO 09/18/17 2209

## 2017-09-18 NOTE — Discharge Instructions (Signed)
Take tylenol 1000mg(2 extra strength) four times a day.  ° °

## 2017-09-30 ENCOUNTER — Other Ambulatory Visit: Payer: Self-pay | Admitting: Cardiovascular Disease

## 2017-09-30 MED ORDER — AMLODIPINE BESYLATE 5 MG PO TABS: 5.0000 mg | ORAL_TABLET | Freq: Every day | ORAL | 0 refills | Status: DC

## 2017-09-30 MED ORDER — POTASSIUM CHLORIDE ER 10 MEQ PO TBCR
10.0000 meq | EXTENDED_RELEASE_TABLET | Freq: Every day | ORAL | 0 refills | Status: DC
Start: 2017-09-30 — End: 2017-12-24

## 2017-09-30 MED ORDER — ATORVASTATIN CALCIUM 40 MG PO TABS: 40.0000 mg | ORAL_TABLET | Freq: Every day | ORAL | 0 refills | Status: DC

## 2017-10-03 ENCOUNTER — Ambulatory Visit (INDEPENDENT_AMBULATORY_CARE_PROVIDER_SITE_OTHER)
Admission: RE | Admit: 2017-10-03 | Discharge: 2017-10-03 | Disposition: A | Discharge status: Home / Self Care | Payer: Medicare Other | Source: Ambulatory Visit | Attending: Family Medicine | Admitting: Family Medicine

## 2017-10-03 ENCOUNTER — Encounter: Payer: Self-pay | Admitting: Family Medicine

## 2017-10-03 ENCOUNTER — Ambulatory Visit (INDEPENDENT_AMBULATORY_CARE_PROVIDER_SITE_OTHER): Payer: Medicare Other | Admitting: Family Medicine

## 2017-10-03 DIAGNOSIS — I639 Cerebral infarction, unspecified: Secondary | ICD-10-CM

## 2017-10-03 DIAGNOSIS — S99912A Unspecified injury of left ankle, initial encounter: Secondary | ICD-10-CM | POA: Diagnosis not present

## 2017-10-03 DIAGNOSIS — M25572 Pain in left ankle and joints of left foot: Secondary | ICD-10-CM | POA: Diagnosis not present

## 2017-10-03 DIAGNOSIS — M7989 Other specified soft tissue disorders: Secondary | ICD-10-CM | POA: Diagnosis not present

## 2017-10-03 NOTE — Progress Notes (Signed)
Patient ID: [name], female    DOB: [birth year]  Age: 83 y.o. MRN: [id number]    Subjective:  Subjective  HPI [name] presents for L ankle pain after a fall on the 9th.  She has pain with walking but is able to walk with her cane.  Review of Systems  Constitutional: Negative for appetite change, diaphoresis, fatigue and unexpected weight change.  Eyes: Negative for pain, redness and visual disturbance.  Respiratory: Negative for cough, chest tightness, shortness of breath and wheezing.   Cardiovascular: Negative for chest pain, palpitations and leg swelling.  Endocrine: Negative for cold intolerance, heat intolerance, polydipsia, polyphagia and polyuria.  Genitourinary: Negative for difficulty urinating, dysuria and frequency.  Musculoskeletal: Positive for arthralgias.  Neurological: Negative for dizziness, light-headedness, numbness and headaches.    History Past Medical History:  Diagnosis Date  . Abdominal pain   . Allergic rhinitis, cause unspecified   . Anemia B twelve deficiency   . Arrhythmia    paroxysmal atrial fibrillation  . Arthritis    "arms and hands" (06/30/2013)  . Benign neoplasm of colon   . Colon polyp   . Coronary artery disease   . Degeneration of lumbar or lumbosacral intervertebral disc   . Dizziness - giddy   . Edema   . GERD (gastroesophageal reflux disease)   . Hyperlipidemia   . Hypertension   . Iron deficiency anemia, unspecified   . Lumbago   . Pernicious anemia   . Seizures ([place])   . Stroke ([place])   . Symptomatic menopausal or female climacteric states   . Thoracic or lumbosacral neuritis or radiculitis, unspecified   . Type II diabetes mellitus ([place])     She has a past surgical history that includes Carpal tunnel release (Bilateral, 1970's); Cataract extraction, bilateral (Bilateral); Cardiac catheterization; Coronary angioplasty with stent; Lumbar disc surgery; Abdominal hysterectomy (1974); Appendectomy (1974); and  Cholecystectomy.   Her family history includes Breast cancer in her daughter, daughter, daughter, and sister; Colon cancer in her sister and sister; Kidney cancer in her daughter.She reports that  has never smoked. Her smokeless tobacco use includes snuff. She reports that she does not drink alcohol or use drugs.  Current Outpatient Medications on File Prior to Visit  Medication Sig Dispense Refill  . amLODipine (NORVASC) 5 MG tablet Take 1 tablet (5 mg total) daily by mouth. Please keep upcoming appt in February for future refills. Thanks 90 tablet 0  . apixaban (ELIQUIS) 2.5 MG TABS tablet Take 1 tablet (2.5 mg total) by mouth 2 (two) times daily. 180 tablet 3  . atorvastatin (LIPITOR) 40 MG tablet Take 1 tablet (40 mg total) daily by mouth. Please keep upcoming appt in February for future refills. Thanks 90 tablet 0  . divalproex (DEPAKOTE ER) 250 MG 24 hr tablet Take 1 tablet (250 mg total) by mouth daily. 30 tablet 6  . donepezil (ARICEPT) 10 MG tablet Take 1 tablet (10 mg total) by mouth at bedtime. 90 tablet 3  . glucose blood (ONE TOUCH ULTRA TEST) test strip Use to check blood sugar 1 time per day. Dx Code: E11.9 100 each 12  . metFORMIN (GLUCOPHAGE) 500 MG tablet TAKE 1 TABLET BY MOUTH TWICE DAILY WITH MEALS (NEEDS  APPOINTMENT) 180 tablet 0  . metoprolol succinate (TOPROL-XL) 50 MG 24 hr tablet TAKE 1 TABLET BY MOUTH DAILY. TAKE WITH OR IMMEDIATELY FOLLOWING A MEAL. 90 tablet 3  . Na Sulfate-K Sulfate-Mg Sulf 17.5-3.13-1.6 GM/180ML SOLN Suprep-Use as directed 354 mL 0  .  nitroGLYCERIN (NITROSTAT) 0.4 MG SL tablet Place 0.4 mg under the tongue every 5 (five) minutes as needed for chest pain. Reported on 12/21/2015    . omeprazole (PRILOSEC) 40 MG capsule Take 1 capsule (40 mg total) by mouth daily. 90 capsule 0  . ondansetron (ZOFRAN ODT) 8 MG disintegrating tablet Take 1 tablet (8 mg total) by mouth every 8 (eight) hours as needed for nausea or vomiting. 10 tablet 0  . ONETOUCH DELICA  LANCETS 34H MISC Use 1 lancet to check glucose once daily 100 each 1  . OVER THE COUNTER MEDICATION Place 1 patch onto the skin daily as needed (pain). Arthritis pain reliever patch    . potassium chloride (KLOR-CON 10) 10 MEQ tablet Take 1 tablet (10 mEq total) daily by mouth. Please keep upcoming appt in February for future refills. Thanks 90 tablet 0   No current facility-administered medications on file prior to visit.      Objective:  Objective  Physical Exam  Constitutional: She is oriented to person, place, and time. She appears well-developed and well-nourished.  HENT:  Head: Normocephalic and atraumatic.  Eyes: Conjunctivae and EOM are normal.  Neck: Normal range of motion. Neck supple. No JVD present. Carotid bruit is not present. No thyromegaly present.  Cardiovascular: Normal rate, regular rhythm and normal heart sounds.  No murmur heard. Pulmonary/Chest: Effort normal and breath sounds normal. No respiratory distress. She has no wheezes. She has no rales. She exhibits no tenderness.  Musculoskeletal: She exhibits tenderness. She exhibits no edema.       Left ankle: She exhibits swelling. She exhibits no ecchymosis, no deformity, no laceration and normal pulse. Tenderness. Lateral malleolus tenderness found.  Neurological: She is alert and oriented to person, place, and time.  Psychiatric: She has a normal mood and affect.  Nursing note and vitals reviewed.  BP 140/70 (BP Location: Right Arm, Patient Position: Sitting, Cuff Size: Normal)   Pulse 69   Temp 97.7 F (36.5 C) (Oral)   Ht 5\' 1"  (1.549 m)   Wt 115 lb (52.2 kg)   SpO2 98%   BMI 21.73 kg/m  Wt Readings from Last 3 Encounters:  10/03/17 115 lb (52.2 kg)  09/18/17 111 lb (50.3 kg)  07/28/17 111 lb (50.3 kg)     Lab Results  Component Value Date   WBC 6.3 07/28/2017   HGB 10.2 (L) 07/28/2017   HCT 31.8 (L) 07/28/2017   PLT 298 07/28/2017   GLUCOSE 162 (H) 07/28/2017   CHOL 148 04/12/2016   TRIG 64.0  04/12/2016   HDL 60.30 04/12/2016   LDLDIRECT 155.3 08/11/2008   LDLCALC 75 04/12/2016   ALT 14 07/28/2017   AST 23 07/28/2017   NA 140 07/28/2017   K 3.4 (L) 07/28/2017   CL 103 07/28/2017   CREATININE 0.77 07/28/2017   BUN 16 07/28/2017   CO2 27 07/28/2017   TSH 3.14 04/12/2016   INR 1.08 07/22/2015   HGBA1C 7.9 (H) 04/16/2017   MICROALBUR 81.3 (H) 04/16/2017    Dg Ribs Unilateral W/chest Left  Result Date: 09/18/2017 CLINICAL DATA:  Left chest wall pain EXAM: LEFT RIBS AND CHEST - 3+ VIEW COMPARISON:  09/22/2016 FINDINGS: Single-view chest demonstrates low lung volumes. No acute consolidation or effusion. No pneumothorax. Normal heart size. Aortic atherosclerosis. Surgical clips in the right upper quadrant. Left rib series demonstrates no acute displaced rib fracture. Vascular calcification over the left breast. IMPRESSION: 1. Negative for pneumothorax or pleural effusion 2. No acute displaced left  rib fracture Electronically Signed   By: Donavan Foil M.D.   On: 09/18/2017 19:31   Dg Shoulder Left  Result Date: 09/18/2017 CLINICAL DATA:  Fall with shoulder pain EXAM: LEFT SHOULDER - 2+ VIEW COMPARISON:  None. FINDINGS: No fracture or dislocation. Mild AC joint degenerative change. Subacromial spurring. Left lung apex is clear. IMPRESSION: No acute osseous abnormality Electronically Signed   By: Donavan Foil M.D.   On: 09/18/2017 18:04     Assessment & Plan:  Plan  I am having Letecia Natter maintain her nitroGLYCERIN, OVER THE COUNTER MEDICATION, metoprolol succinate, apixaban, glucose blood, ONETOUCH DELICA LANCETS 54D, ondansetron, donepezil, divalproex, Na Sulfate-K Sulfate-Mg Sulf, omeprazole, metFORMIN, amLODipine, atorvastatin, and potassium chloride.  No orders of the defined types were placed in this encounter.   Problem List Items Addressed This Visit      Unprioritized   Left ankle pain   Relevant Orders   DG Ankle Complete Left (Completed)    go downstairs  and get xray Ace wrap Ice , elevation Refer to ortho if no improvement  Follow-up: No Follow-up on file.  Ann Held, DO

## 2017-10-03 NOTE — Patient Instructions (Signed)
Ankle Pain Many things can cause ankle pain, including an injury to the area and overuse of the ankle.The ankle joint holds your body weight and allows you to move around. Ankle pain can occur on either side or the back of one ankle or both ankles. Ankle pain may be sharp and burning or dull and aching. There may be tenderness, stiffness, redness, or warmth around the ankle. Follow these instructions at home: Activity  Rest your ankle as told by your health care provider. Avoid any activities that cause ankle pain.  Do exercises as told by your health care provider.  Ask your health care provider if you can drive. Using a brace, a bandage, or crutches  If you were given a brace: ? Wear it as told by your health care provider. ? Remove it when you take a bath or a shower. ? Try not to move your ankle very much, but wiggle your toes from time to time. This helps to prevent swelling.  If you were given an elastic bandage: ? Remove it when you take a bath or a shower. ? Try not to move your ankle very much, but wiggle your toes from time to time. This helps to prevent swelling. ? Adjust the bandage to make it more comfortable if it feels too tight. ? Loosen the bandage if you have numbness or tingling in your foot or if your foot turns cold and blue.  If you have crutches, use them as told by your health care provider. Continue to use them until you can walk without feeling pain in your ankle. Managing pain, stiffness, and swelling  Raise (elevate) your ankle above the level of your heart while you are sitting or lying down.  If directed, apply ice to the area: ? Put ice in a plastic bag. ? Place a towel between your skin and the bag. ? Leave the ice on for 20 minutes, 2-3 times per day. General instructions  Keep all follow-up visits as told by your health care provider. This is important.  Record this information that may be helpful for you and your health care provider: ? How  often you have ankle pain. ? Where the pain is located. ? What the pain feels like.  Take over-the-counter and prescription medicines only as told by your health care provider. Contact a health care provider if:  Your pain gets worse.  Your pain is not relieved with medicines.  You have a fever or chills.  You are having more trouble with walking.  You have new symptoms. Get help right away if:  Your foot, leg, toes, or ankle tingles or becomes numb.  Your foot, leg, toes, or ankle becomes swollen.  Your foot, leg, toes, or ankle turns pale or blue. This information is not intended to replace advice given to you by your health care provider. Make sure you discuss any questions you have with your health care provider. Document Released: 04/17/2010 Document Revised: 06/28/2016 Document Reviewed: 05/30/2015 Elsevier Interactive Patient Education  2017 Elsevier Inc[employer]  

## 2017-10-09 ENCOUNTER — Telehealth: Payer: Self-pay | Admitting: Internal Medicine

## 2017-10-09 NOTE — Telephone Encounter (Signed)
Please advise 

## 2017-10-09 NOTE — Telephone Encounter (Signed)
Spoke with pts daughter to inform.  

## 2017-10-09 NOTE — Telephone Encounter (Signed)
Copied from [place]. Topic: Quick Communication - Other Results >> Oct 09, 2017  8:03 AM Lennox Solders wrote: Pt daughter   is calling requesting her mom  leg xray result

## 2017-10-15 ENCOUNTER — Telehealth: Payer: Self-pay | Admitting: Internal Medicine

## 2017-10-15 NOTE — Telephone Encounter (Signed)
I have received FMLA forms via fax from the daughter Lynn Ito ( She is not on the [facility] from [employer].   I called and spoke with the daughter to see what the forms are for and what she is requesting. She is asking for 1 to 5 days a month. She states she needs this to take her mother to the doctors appointments, or when her mother is not feeling well.   She talked about her mother's recent hospital visit. I informed she is going to need a hospital fu with Dr. Quay Burow. She stated they saw Dr. Etter Sjogren for this. I also informed her she will need to have her mother update a DPR the next time they are here.   She wanted to wait for Dr.Burns to advise and if she needs and appointment they will schedule one.

## 2017-10-15 NOTE — Telephone Encounter (Signed)
Since she followed up - no appt needed - she has a f/u with me in Jan

## 2017-10-16 NOTE — Telephone Encounter (Signed)
yes

## 2017-10-16 NOTE — Telephone Encounter (Signed)
Are you ok with the FMLA forms being completed for the daughter for the 1 to 5 days for the month? Reason being for the Dementia with behavioral disturbance and falls?

## 2017-10-17 ENCOUNTER — Ambulatory Visit: Payer: Medicare Other | Admitting: Internal Medicine

## 2017-10-22 ENCOUNTER — Other Ambulatory Visit: Payer: Medicare Other

## 2017-10-22 ENCOUNTER — Ambulatory Visit: Payer: Medicare Other | Admitting: Family

## 2017-10-22 ENCOUNTER — Encounter: Payer: Self-pay | Admitting: Internal Medicine

## 2017-10-22 ENCOUNTER — Ambulatory Visit (INDEPENDENT_AMBULATORY_CARE_PROVIDER_SITE_OTHER): Payer: Medicare Other | Admitting: Internal Medicine

## 2017-10-22 VITALS — BP 122/84 | HR 67 | Temp 97.8°F | Resp 16

## 2017-10-22 DIAGNOSIS — N3000 Acute cystitis without hematuria: Secondary | ICD-10-CM

## 2017-10-22 DIAGNOSIS — R300 Dysuria: Secondary | ICD-10-CM

## 2017-10-22 DIAGNOSIS — I639 Cerebral infarction, unspecified: Secondary | ICD-10-CM

## 2017-10-22 LAB — POCT URINALYSIS DIPSTICK
Bilirubin, UA: NEGATIVE
Glucose, UA: NEGATIVE
Ketones, UA: NEGATIVE
Nitrite, UA: NEGATIVE
Spec Grav, UA: <=1.005 — AB (ref 1.010–1.025)
Urobilinogen, UA: 0.2 E.U./dL
pH, UA: 6 (ref 5.0–8.0)

## 2017-10-22 MED ORDER — CIPROFLOXACIN HCL 250 MG PO TABS: 250.0000 mg | ORAL_TABLET | Freq: Two times a day (BID) | ORAL | 0 refills | Status: DC

## 2017-10-22 NOTE — Assessment & Plan Note (Signed)
Urine dip consistent with UTI Will send urine for culture Take the antibiotic as prescribed.   Take tylenol if needed.   Increase your water intake.  Call if no improvement   

## 2017-10-22 NOTE — Patient Instructions (Signed)
Take the antibiotic as prescribed.  Take tylenol if needed.  Increase your water intake.      Urinary Tract Infection, Adult A urinary tract infection (UTI) is an infection of any part of the urinary tract, which includes the kidneys, ureters, bladder, and urethra. These organs make, store, and get rid of urine in the body. UTI can be a bladder infection (cystitis) or kidney infection (pyelonephritis). What are the causes? This infection may be caused by fungi, viruses, or bacteria. Bacteria are the most common cause of UTIs. This condition can also be caused by repeated incomplete emptying of the bladder during urination. What increases the risk? This condition is more likely to develop if:  You ignore your need to urinate or hold urine for long periods of time.  You do not empty your bladder completely during urination.  You wipe back to front after urinating or having a bowel movement, if you are female.  You are uncircumcised, if you are female.  You are constipated.  You have a urinary catheter that stays in place (indwelling).  You have a weak defense (immune) system.  You have a medical condition that affects your bowels, kidneys, or bladder.  You have diabetes.  You take antibiotic medicines frequently or for long periods of time, and the antibiotics no longer work well against certain types of infections (antibiotic resistance).  You take medicines that irritate your urinary tract.  You are exposed to chemicals that irritate your urinary tract.  You are female.  What are the signs or symptoms? Symptoms of this condition include:  Fever.  Frequent urination or passing small amounts of urine frequently.  Needing to urinate urgently.  Pain or burning with urination.  Urine that smells bad or unusual.  Cloudy urine.  Pain in the lower abdomen or back.  Trouble urinating.  Blood in the urine.  Vomiting or being less hungry than normal.  Diarrhea or  abdominal pain.  Vaginal discharge, if you are female.  How is this diagnosed? This condition is diagnosed with a medical history and physical exam. You will also need to provide a urine sample to test your urine. Other tests may be done, including:  Blood tests.  Sexually transmitted disease (STD) testing.  If you have had more than one UTI, a cystoscopy or imaging studies may be done to determine the cause of the infections. How is this treated? Treatment for this condition often includes a combination of two or more of the following:  Antibiotic medicine.  Other medicines to treat less common causes of UTI.  Over-the-counter medicines to treat pain.  Drinking enough water to stay hydrated.  Follow these instructions at home:  Take over-the-counter and prescription medicines only as told by your health care provider.  If you were prescribed an antibiotic, take it as told by your health care provider. Do not stop taking the antibiotic even if you start to feel better.  Avoid alcohol, caffeine, tea, and carbonated beverages. They can irritate your bladder.  Drink enough fluid to keep your urine clear or pale yellow.  Keep all follow-up visits as told by your health care provider. This is important.  Make sure to: ? Empty your bladder often and completely. Do not hold urine for long periods of time. ? Empty your bladder before and after sex. ? Wipe from front to back after a bowel movement if you are female. Use each tissue one time when you wipe. Contact a health care provider if:    You have back pain.  You have a fever.  You feel nauseous or vomit.  Your symptoms do not get better after 3 days.  Your symptoms go away and then return. Get help right away if:  You have severe back pain or lower abdominal pain.  You are vomiting and cannot keep down any medicines or water. This information is not intended to replace advice given to you by your health care provider.  Make sure you discuss any questions you have with your health care provider. Document Released: 08/07/2005 Document Revised: 04/10/2016 Document Reviewed: 09/18/2015 Elsevier Interactive Patient Education  2017 Elsevier Inc.  

## 2017-10-22 NOTE — Progress Notes (Signed)
Subjective:    Patient ID: [name], female    DOB: 07/23/1925, 83 y.o.   MRN: [id number]  HPI She is here for an acute visit.   ? Uti:  Her symptoms started yesterday.  She has dysuria and lower abdominal pain.  She has chronic back pain and is unsure if it is worse.  She denies fever, nausea, urinary frequency, hematuria, urinary urgency, headaches and lightheadedness.        Medications and allergies reviewed with patient and updated if appropriate.  Patient Active Problem List   Diagnosis Date Noted  . Left ankle pain 10/03/2017  . Moderate dementia with behavioral disturbance 03/21/2017  . Cerebral infarction ([place]) 12/27/2015  . Convulsions ([place]) 09/11/2015  . SVT (supraventricular tachycardia) ([place])   . Fall 08/21/2015  . PAF (paroxysmal atrial fibrillation) ([place])   . Stroke (cerebrum) ([place]) 08/20/2015  . Aphasia   . TIA (transient ischemic attack)   . FTT (failure to thrive) in adult   . Memory loss 02/24/2015  . Syncope 10/31/2014  . Diabetes mellitus type 2, controlled ([place]) 10/31/2014  . Uncontrolled hypertension 10/31/2014  . Headache 11/26/2013  . CAD, NATIVE VESSEL 02/14/2010  . GERD 02/14/2010  . ANEMIA-B12 DEFICIENCY 01/01/2010  . OSTEOARTHRITIS 01/01/2010  . PERSONAL HX COLONIC POLYPS 01/01/2010  . Iron deficiency anemia 12/28/2009  . DIZZINESS AND GIDDINESS 12/28/2009  . Lower abdominal pain 09/13/2009  . Chronic lower back pain 08/11/2008  . KNEE PAIN, RIGHT 02/11/2008  . Dyslipidemia 04/23/2007  . Allergic rhinitis 04/23/2007  . GALLBLADDER DISEASE 04/23/2007  . DIVERTICULITIS, HX OF 04/23/2007    Current Outpatient Medications on File Prior to Visit  Medication Sig Dispense Refill  . amLODipine (NORVASC) 5 MG tablet Take 1 tablet (5 mg total) daily by mouth. Please keep upcoming appt in February for future refills. Thanks 90 tablet 0  . apixaban (ELIQUIS) 2.5 MG TABS tablet Take 1 tablet (2.5 mg total) by mouth 2 (two) times daily. 180  tablet 3  . atorvastatin (LIPITOR) 40 MG tablet Take 1 tablet (40 mg total) daily by mouth. Please keep upcoming appt in February for future refills. Thanks 90 tablet 0  . divalproex (DEPAKOTE ER) 250 MG 24 hr tablet Take 1 tablet (250 mg total) by mouth daily. 30 tablet 6  . donepezil (ARICEPT) 10 MG tablet Take 1 tablet (10 mg total) by mouth at bedtime. 90 tablet 3  . glucose blood (ONE TOUCH ULTRA TEST) test strip Use to check blood sugar 1 time per day. Dx Code: E11.9 100 each 12  . metFORMIN (GLUCOPHAGE) 500 MG tablet TAKE 1 TABLET BY MOUTH TWICE DAILY WITH MEALS (NEEDS  APPOINTMENT) 180 tablet 0  . metoprolol succinate (TOPROL-XL) 50 MG 24 hr tablet TAKE 1 TABLET BY MOUTH DAILY. TAKE WITH OR IMMEDIATELY FOLLOWING A MEAL. 90 tablet 3  . Na Sulfate-K Sulfate-Mg Sulf 17.5-3.13-1.6 GM/180ML SOLN Suprep-Use as directed 354 mL 0  . nitroGLYCERIN (NITROSTAT) 0.4 MG SL tablet Place 0.4 mg under the tongue every 5 (five) minutes as needed for chest pain. Reported on 12/21/2015    . omeprazole (PRILOSEC) 40 MG capsule Take 1 capsule (40 mg total) by mouth daily. 90 capsule 0  . ondansetron (ZOFRAN ODT) 8 MG disintegrating tablet Take 1 tablet (8 mg total) by mouth every 8 (eight) hours as needed for nausea or vomiting. 10 tablet 0  . ONETOUCH DELICA LANCETS 56D MISC Use 1 lancet to check glucose once daily 100 each 1  .  OVER THE COUNTER MEDICATION Place 1 patch onto the skin daily as needed (pain). Arthritis pain reliever patch    . potassium chloride (KLOR-CON 10) 10 MEQ tablet Take 1 tablet (10 mEq total) daily by mouth. Please keep upcoming appt in February for future refills. Thanks 90 tablet 0   No current facility-administered medications on file prior to visit.     Past Medical History:  Diagnosis Date  . Abdominal pain   . Allergic rhinitis, cause unspecified   . Anemia B twelve deficiency   . Arrhythmia    paroxysmal atrial fibrillation  . Arthritis    "arms and hands" (06/30/2013)  .  Benign neoplasm of colon   . Colon polyp   . Coronary artery disease   . Degeneration of lumbar or lumbosacral intervertebral disc   . Dizziness - giddy   . Edema   . GERD (gastroesophageal reflux disease)   . Hyperlipidemia   . Hypertension   . Iron deficiency anemia, unspecified   . Lumbago   . Pernicious anemia   . Seizures ([place])   . Stroke ([place])   . Symptomatic menopausal or female climacteric states   . Thoracic or lumbosacral neuritis or radiculitis, unspecified   . Type II diabetes mellitus ([place])     Past Surgical History:  Procedure Laterality Date  . ABDOMINAL HYSTERECTOMY  1974  . APPENDECTOMY  1974  . CARDIAC CATHETERIZATION    . CARPAL TUNNEL RELEASE Bilateral 1970's  . CATARACT EXTRACTION, BILATERAL Bilateral   . CHOLECYSTECTOMY    . CORONARY ANGIOPLASTY WITH STENT PLACEMENT     "1" (06/30/2013  . LUMBAR DISC SURGERY      Social History   Socioeconomic History  . Marital status: Widowed    Spouse name: None  . Number of children: 8  . Years of education: 10th Grade  . Highest education level: None  Social Needs  . Financial resource strain: None  . Food insecurity - worry: None  . Food insecurity - inability: None  . Transportation needs - medical: None  . Transportation needs - non-medical: None  Occupational History  . None  Tobacco Use  . Smoking status: Never Smoker  . Smokeless tobacco: Current User    Types: Snuff  Substance and Sexual Activity  . Alcohol use: No    Alcohol/week: 0.0 oz  . Drug use: No  . Sexual activity: No  Other Topics Concern  . None  Social History Narrative   Patient lives with daughter in a two story home.   Patient has a 10th grade education.   Patient is retired.   Patient is right handed.    Family History  Problem Relation Age of Onset  . Breast cancer Sister        "behind heart"  . Colon cancer Sister   . Colon cancer Sister   . Breast cancer Daughter   . Breast cancer Daughter   . Breast cancer  Daughter   . Kidney cancer Daughter     Review of Systems  Constitutional: Negative for chills and fever.  Gastrointestinal: Positive for abdominal pain. Negative for nausea.  Genitourinary: Positive for dysuria. Negative for frequency, hematuria and urgency.  Musculoskeletal: Positive for back pain.  Neurological: Negative for light-headedness and headaches.       Objective:   Vitals:   10/22/17 1526  BP: 122/84  Pulse: 67  Resp: 16  Temp: 97.8 F (36.6 C)  SpO2: 98%   Wt Readings from Last 3 Encounters:  10/03/17 115 lb (52.2 kg)  09/18/17 111 lb (50.3 kg)  07/28/17 111 lb (50.3 kg)   There is no height or weight on file to calculate BMI.   Physical Exam  Constitutional: She appears well-developed and well-nourished. No distress.  HENT:  Head: Normocephalic and atraumatic.  Eyes: Conjunctivae are normal.  Abdominal: She exhibits no mass. There is tenderness (lower abdominal). There is no rebound and no guarding.  Skin: Skin is warm and dry. She is not diaphoretic.          Assessment & Plan:    See Problem List for Assessment and Plan of chronic medical problems.

## 2017-10-23 DIAGNOSIS — Z0279 Encounter for issue of other medical certificate: Secondary | ICD-10-CM

## 2017-10-23 LAB — URINE CULTURE
MICRO NUMBER:: 81396877
SPECIMEN QUALITY:: ADEQUATE

## 2017-10-23 NOTE — Telephone Encounter (Signed)
signed

## 2017-10-23 NOTE — Telephone Encounter (Signed)
Completed FMLA given to [place]

## 2017-10-23 NOTE — Telephone Encounter (Signed)
Forms have been completed, given to MD to review &sign.

## 2017-10-24 NOTE — Telephone Encounter (Signed)
Forms have been faxed to [phone], Copy sent to scan, Original ready for pick - LVM to inform [facility], &Charged for.

## 2017-10-25 ENCOUNTER — Ambulatory Visit (INDEPENDENT_AMBULATORY_CARE_PROVIDER_SITE_OTHER): Payer: Medicare Other | Admitting: Physician Assistant

## 2017-10-25 ENCOUNTER — Encounter: Payer: Self-pay | Admitting: Physician Assistant

## 2017-10-25 VITALS — BP 138/70 | HR 76 | Temp 97.8°F | Resp 14

## 2017-10-25 DIAGNOSIS — G8929 Other chronic pain: Secondary | ICD-10-CM | POA: Diagnosis not present

## 2017-10-25 DIAGNOSIS — R1031 Right lower quadrant pain: Secondary | ICD-10-CM | POA: Diagnosis not present

## 2017-10-25 DIAGNOSIS — I639 Cerebral infarction, unspecified: Secondary | ICD-10-CM | POA: Diagnosis not present

## 2017-10-25 DIAGNOSIS — R300 Dysuria: Secondary | ICD-10-CM | POA: Diagnosis not present

## 2017-10-25 LAB — POCT URINALYSIS DIPSTICK
Bilirubin, UA: NEGATIVE
Glucose, UA: NEGATIVE
Ketones, UA: NEGATIVE
Nitrite, UA: NEGATIVE
Spec Grav, UA: 1.01 (ref 1.010–1.025)
Urobilinogen, UA: 0.2 E.U./dL
pH, UA: 6 (ref 5.0–8.0)

## 2017-10-25 MED ORDER — CEPHALEXIN 250 MG PO CAPS: 250.0000 mg | ORAL_CAPSULE | Freq: Two times a day (BID) | ORAL | 0 refills | Status: DC

## 2017-10-25 NOTE — Patient Instructions (Signed)
Your symptoms are consistent with a bladder infection, also called acute cystitis. Please stop the Ciprofloxacin. Please take your new antibiotic (Keflex) as directed until all pills are gone.  Stay very well hydrated.  Consider a daily probiotic (Align, Culturelle, or Activia) to help prevent stomach upset caused by the antibiotic.  Taking a probiotic daily may also help prevent recurrent UTIs.  Also consider taking AZO (Phenazopyridine) tablets to help decrease pain with urination.  I will call you with your urine testing results.  We will change antibiotics if indicated.   I will place a referral to Gastroenterology for your chronic RLQ pain. Also, I want you to see Dr. Quay Burow Monday or Tuesday so she can get a further assessment as I do not have access to labs or imaging today.    Urinary Tract Infection A urinary tract infection (UTI) can occur any place along the urinary tract. The tract includes the kidneys, ureters, bladder, and urethra. A type of germ called bacteria often causes a UTI. UTIs are often helped with antibiotic medicine.  HOME CARE   If given, take antibiotics as told by your doctor. Finish them even if you start to feel better.  Drink enough fluids to keep your pee (urine) clear or pale yellow.  Avoid tea, drinks with caffeine, and bubbly (carbonated) drinks.  Pee often. Avoid holding your pee in for a long time.  Pee before and after having sex (intercourse).  Wipe from front to back after you poop (bowel movement) if you are a woman. Use each tissue only once. GET HELP RIGHT AWAY IF:   You have back pain.  You have lower belly (abdominal) pain.  You have chills.  You feel sick to your stomach (nauseous).  You throw up (vomit).  Your burning or discomfort with peeing does not go away.  You have a fever.  Your symptoms are not better in 3 days. MAKE SURE YOU:   Understand these instructions.  Will watch your condition.  Will get help right away if  you are not doing well or get worse. Document Released: 04/15/2008 Document Revised: 07/22/2012 Document Reviewed: 05/28/2012 [facility] Patient Information 2015 [place], [place]. This information is not intended to replace advice given to you by your health care provider. Make sure you discuss any questions you have with your health care provider.

## 2017-10-26 ENCOUNTER — Other Ambulatory Visit (HOSPITAL_COMMUNITY)
Admit: 2017-10-26 | Discharge: 2017-10-26 | Disposition: A | Discharge status: Home / Self Care | Payer: Medicare Other | Source: Other Acute Inpatient Hospital | Attending: Physician Assistant | Admitting: Physician Assistant

## 2017-10-26 DIAGNOSIS — R300 Dysuria: Secondary | ICD-10-CM | POA: Diagnosis not present

## 2017-10-26 DIAGNOSIS — G8929 Other chronic pain: Secondary | ICD-10-CM | POA: Insufficient documentation

## 2017-10-26 DIAGNOSIS — R1031 Right lower quadrant pain: Secondary | ICD-10-CM | POA: Diagnosis not present

## 2017-10-26 NOTE — Progress Notes (Signed)
 Patient presents to clinic today with her daughter complaining of abdominal cramping and mild nausea since starting Cipro for suspected UTI. Patient was seen by PCP on 10/22/17 and diagnosed with UTI. Patient started on ABX pending culture results. Culture showed multiple colonizers. Patient has noted symptom improvement with Cipro but cannot tolerate the nausea and abdominal cramping. As such has stopped medication. Still having dysuria but much milder. Is hydrating well per patient and daughter. They also c/o of chronic RLQ pain that has been present for many months. Has had workup for this by PCP and in ER previously without noted cause. Daughter feels patient needs colonoscopy but this has been declined due to her age.  Past Medical History:  Diagnosis Date  . Abdominal pain   . Allergic rhinitis, cause unspecified   . Anemia B twelve deficiency   . Arrhythmia    paroxysmal atrial fibrillation  . Arthritis    "arms and hands" (06/30/2013)  . Benign neoplasm of colon   . Colon polyp   . Coronary artery disease   . Degeneration of lumbar or lumbosacral intervertebral disc   . Dizziness - giddy   . Edema   . GERD (gastroesophageal reflux disease)   . Hyperlipidemia   . Hypertension   . Iron deficiency anemia, unspecified   . Lumbago   . Pernicious anemia   . Seizures ([facility])   . Stroke ([facility])   . Symptomatic menopausal or female climacteric states   . Thoracic or lumbosacral neuritis or radiculitis, unspecified   . Type II diabetes mellitus ([facility])     Current Outpatient Medications on File Prior to Visit  Medication Sig Dispense Refill  . amLODipine (NORVASC) 5 MG tablet Take 1 tablet (5 mg total) daily by mouth. Please keep upcoming appt in February for future refills. Thanks 90 tablet 0  . apixaban (ELIQUIS) 2.5 MG TABS tablet Take 1 tablet (2.5 mg total) by mouth 2 (two) times daily. 180 tablet 3  . atorvastatin (LIPITOR) 40 MG tablet Take 1 tablet (40 mg total) daily by mouth.  Please keep upcoming appt in February for future refills. Thanks 90 tablet 0  . divalproex (DEPAKOTE ER) 250 MG 24 hr tablet Take 1 tablet (250 mg total) by mouth daily. 30 tablet 6  . donepezil (ARICEPT) 10 MG tablet Take 1 tablet (10 mg total) by mouth at bedtime. 90 tablet 3  . glucose blood (ONE TOUCH ULTRA TEST) test strip Use to check blood sugar 1 time per day. Dx Code: E11.9 100 each 12  . metFORMIN (GLUCOPHAGE) 500 MG tablet TAKE 1 TABLET BY MOUTH TWICE DAILY WITH MEALS (NEEDS  APPOINTMENT) 180 tablet 0  . metoprolol succinate (TOPROL-XL) 50 MG 24 hr tablet TAKE 1 TABLET BY MOUTH DAILY. TAKE WITH OR IMMEDIATELY FOLLOWING A MEAL. 90 tablet 3  . Na Sulfate-K Sulfate-Mg Sulf 17.5-3.13-1.6 GM/180ML SOLN Suprep-Use as directed 354 mL 0  . nitroGLYCERIN (NITROSTAT) 0.4 MG SL tablet Place 0.4 mg under the tongue every 5 (five) minutes as needed for chest pain. Reported on 12/21/2015    . omeprazole (PRILOSEC) 40 MG capsule Take 1 capsule (40 mg total) by mouth daily. 90 capsule 0  . ondansetron (ZOFRAN ODT) 8 MG disintegrating tablet Take 1 tablet (8 mg total) by mouth every 8 (eight) hours as needed for nausea or vomiting. 10 tablet 0  . ONETOUCH DELICA LANCETS 33G MISC Use 1 lancet to check glucose once daily 100 each 1  . OVER THE COUNTER MEDICATION Place   1 patch onto the skin daily as needed (pain). Arthritis pain reliever patch    . potassium chloride (KLOR-CON 10) 10 MEQ tablet Take 1 tablet (10 mEq total) daily by mouth. Please keep upcoming appt in February for future refills. Thanks 90 tablet 0   No current facility-administered medications on file prior to visit.     Allergies  Allergen Reactions  . Clindamycin Other (See Comments)    unspecified  . Iodides Hives  . Iodinated Diagnostic Agents Hives  . Penicillins Swelling      . Sulfa Antibiotics Hives  . Sulfonamide Derivatives Other (See Comments)    REACTION: "like my head was full of water"  . Albumin (Human) Other (See  Comments)    unspecified  . Povidone Iodine   . Celecoxib Itching and Rash  . Erythromycin Itching and Rash  . Nitrofuran Derivatives Rash  . Nitrofurantoin Itching and Rash  . Piroxicam Other (See Comments)    unknown  . Povidone-Iodine Itching and Rash    Family History  Problem Relation Age of Onset  . Breast cancer Sister        "behind heart"  . Colon cancer Sister   . Colon cancer Sister   . Breast cancer Daughter   . Breast cancer Daughter   . Breast cancer Daughter   . Kidney cancer Daughter     Social History   Socioeconomic History  . Marital status: Widowed    Spouse name: None  . Number of children: 8  . Years of education: 10th Grade  . Highest education level: None  Social Needs  . Financial resource strain: None  . Food insecurity - worry: None  . Food insecurity - inability: None  . Transportation needs - medical: None  . Transportation needs - non-medical: None  Occupational History  . None  Tobacco Use  . Smoking status: Never Smoker  . Smokeless tobacco: Current User    Types: Snuff  Substance and Sexual Activity  . Alcohol use: No    Alcohol/week: 0.0 oz  . Drug use: No  . Sexual activity: No  Other Topics Concern  . None  Social History Narrative   Patient lives with daughter in a two story home.   Patient has a 10th grade education.   Patient is retired.   Patient is right handed.   Review of Systems - See HPI.  All other ROS are negative.  BP 138/70   Pulse 76   Temp 97.8 F (36.6 C) (Oral)   Resp 14   SpO2 97%   Physical Exam  Constitutional: She is oriented to person, place, and time and well-developed, well-nourished, and in no distress.  HENT:  Head: Normocephalic and atraumatic.  Eyes: Conjunctivae are normal.  Neck: Neck supple.  Cardiovascular: Normal rate, regular rhythm, normal heart sounds and intact distal pulses.  Pulmonary/Chest: Effort normal and breath sounds normal. No respiratory distress. She has no  wheezes. She has no rales. She exhibits no tenderness.  Abdominal: Soft. Bowel sounds are normal. She exhibits no distension and no mass. There is tenderness (Patient endorses RLQ tenderness with light palpation on exam initailly. When talking to her daughter however, she has not response to even deep palpation in the same area). There is no rebound and no guarding.  Lymphadenopathy:    She has no cervical adenopathy.  Neurological: She is alert and oriented to person, place, and time.  Skin: Skin is warm and dry. No rash noted.  Psychiatric: Affect  normal.  Vitals reviewed.   Recent Results (from the past 2160 hour(s))  Lipase, blood     Status: Abnormal   Collection Time: 07/28/17  8:25 PM  Result Value Ref Range   Lipase 54 (H) 11 - 51 U/L  Comprehensive metabolic panel     Status: Abnormal   Collection Time: 07/28/17  8:25 PM  Result Value Ref Range   Sodium 140 135 - 145 mmol/L   Potassium 3.4 (L) 3.5 - 5.1 mmol/L   Chloride 103 101 - 111 mmol/L   CO2 27 22 - 32 mmol/L   Glucose, Bld 162 (H) 65 - 99 mg/dL   BUN 16 6 - 20 mg/dL   Creatinine, Ser 0.77 0.44 - 1.00 mg/dL   Calcium 9.3 8.9 - 10.3 mg/dL   Total Protein 7.4 6.5 - 8.1 g/dL   Albumin 4.0 3.5 - 5.0 g/dL   AST 23 15 - 41 U/L   ALT 14 14 - 54 U/L   Alkaline Phosphatase 79 38 - 126 U/L   Total Bilirubin 0.9 0.3 - 1.2 mg/dL   GFR calc non Af Amer >60 >60 mL/min   GFR calc Af Amer >60 >60 mL/min    Comment: (NOTE) The eGFR has been calculated using the CKD EPI equation. This calculation has not been validated in all clinical situations. eGFR's persistently <60 mL/min signify possible Chronic Kidney Disease.    Anion gap 10 5 - 15  CBC     Status: Abnormal   Collection Time: 07/28/17  8:25 PM  Result Value Ref Range   WBC 6.3 4.0 - 10.5 K/uL   RBC 3.64 (L) 3.87 - 5.11 MIL/uL   Hemoglobin 10.2 (L) 12.0 - 15.0 g/dL   HCT 31.8 (L) 36.0 - 46.0 %   MCV 87.4 78.0 - 100.0 fL   MCH 28.0 26.0 - 34.0 pg   MCHC 32.1 30.0  - 36.0 g/dL   RDW 14.5 11.5 - 15.5 %   Platelets 298 150 - 400 K/uL  POCT urinalysis dipstick     Status: Abnormal   Collection Time: 10/22/17  3:36 PM  Result Value Ref Range   Color, UA yellow    Clarity, UA cloudy    Glucose, UA neg    Bilirubin, UA neg    Ketones, UA neg    Spec Grav, UA <=1.005 (A) 1.010 - 1.025   Blood, UA 1+    pH, UA 6.0 5.0 - 8.0   Protein, UA 1+    Urobilinogen, UA 0.2 0.2 or 1.0 E.U./dL   Nitrite, UA neg    Leukocytes, UA Small (1+) (A) Negative   Appearance     Odor    Urine Culture     Status: None   Collection Time: 10/22/17  3:58 PM  Result Value Ref Range   MICRO NUMBER: [id number]    SPECIMEN QUALITY: ADEQUATE    Sample Source URINE    STATUS: FINAL    Result:      Multiple organisms present, each less than 10,000 CFU/mL. These organisms, commonly found on external and internal genitalia, are considered to be colonizers. No further testing performed.  POCT Urinalysis Dipstick     Status: Abnormal   Collection Time: 10/25/17 11:33 AM  Result Value Ref Range   Color, UA yellow    Clarity, UA cloudy    Glucose, UA negative    Bilirubin, UA negative    Ketones, UA negative    Spec Grav, UA 1.010 1.010 -  1.025   Blood, UA 1+    pH, UA 6.0 5.0 - 8.0   Protein, UA 1+    Urobilinogen, UA 0.2 0.2 or 1.0 E.U./dL   Nitrite, UA negative    Leukocytes, UA Small (1+) (A) Negative   Appearance     Odor     Assessment/Plan: 1. Dysuria Still with UTI symptoms, states improving with Cipro but cannot tolerate due to abdominal cramping. They have discontinued medications. Repeat UA performed today and still with LE, blood. Discussed potential contamination of last culture. Will send repeat culture. Start 3 day of Keflex (EMR reviewed and patient has taken for UTI before and tolerated well). Follow-up with PCP on Monday for reassessment. - POCT Urinalysis Dipstick - Urine Culture  2. Chronic RLQ pain Patient and daughter endorses this is an ongoing  issue and not changed with recent antibiotic use. Again she endorses pain but when distracted there is no pain with palpation, even deep palpation. Discussed that Saturday clinic if only for simple acutes. She will need follow-up with PCP early next week to discuss further options. Daughter requesting GI referral. I will place this for her.  - POCT Urinalysis Dipstick - Urine Culture    [place] , PA-C 

## 2017-10-27 LAB — URINE CULTURE: Culture: NO GROWTH

## 2017-10-30 ENCOUNTER — Telehealth: Payer: Self-pay

## 2017-10-30 NOTE — Telephone Encounter (Signed)
Spoke with patients daughter, Enid Derry. Provided urine culture results, advised that patient needs to f/u with PCP. Offered to schedule appt, daughter declined at this time. She also reports patients urinary symptoms have resolved. Enid Derry states her sister will call back to schedule f/u with PCP.

## 2017-10-30 NOTE — Telephone Encounter (Signed)
-----   Message from Brunetta Jeans, PA-C sent at 10/30/2017  7:39 AM EST ----- Saturday clinic patient. Urine culture negative for any growth. No need for further ABX. She needs to follow-up with PCP regarding chronic abdominal issues.  ----- Message ----- From: SYSTEM Sent: 10/30/2017  12:06 AM To: Brunetta Jeans, PA-C

## 2017-11-24 ENCOUNTER — Encounter: Payer: Self-pay | Admitting: Internal Medicine

## 2017-11-24 ENCOUNTER — Other Ambulatory Visit (INDEPENDENT_AMBULATORY_CARE_PROVIDER_SITE_OTHER): Payer: Medicare Other

## 2017-11-24 ENCOUNTER — Ambulatory Visit (INDEPENDENT_AMBULATORY_CARE_PROVIDER_SITE_OTHER): Payer: Medicare Other | Admitting: Internal Medicine

## 2017-11-24 VITALS — BP 174/80 | HR 71 | Resp 16 | Wt 108.0 lb

## 2017-11-24 DIAGNOSIS — R300 Dysuria: Secondary | ICD-10-CM

## 2017-11-24 DIAGNOSIS — E785 Hyperlipidemia, unspecified: Secondary | ICD-10-CM | POA: Diagnosis not present

## 2017-11-24 DIAGNOSIS — E1122 Type 2 diabetes mellitus with diabetic chronic kidney disease: Secondary | ICD-10-CM | POA: Diagnosis not present

## 2017-11-24 DIAGNOSIS — I10 Essential (primary) hypertension: Secondary | ICD-10-CM

## 2017-11-24 DIAGNOSIS — K219 Gastro-esophageal reflux disease without esophagitis: Secondary | ICD-10-CM

## 2017-11-24 DIAGNOSIS — D509 Iron deficiency anemia, unspecified: Secondary | ICD-10-CM

## 2017-11-24 DIAGNOSIS — N181 Chronic kidney disease, stage 1: Secondary | ICD-10-CM

## 2017-11-24 DIAGNOSIS — N390 Urinary tract infection, site not specified: Secondary | ICD-10-CM | POA: Diagnosis not present

## 2017-11-24 LAB — CBC WITH DIFFERENTIAL/PLATELET
Basophils Absolute: 0 10*3/uL (ref 0.0–0.1)
Basophils Relative: 0.6 % (ref 0.0–3.0)
Eosinophils Absolute: 0.3 10*3/uL (ref 0.0–0.7)
Eosinophils Relative: 3.5 % (ref 0.0–5.0)
HCT: 32.5 % — ABNORMAL LOW (ref 36.0–46.0)
Hemoglobin: 10.3 g/dL — ABNORMAL LOW (ref 12.0–15.0)
Lymphocytes Relative: 30 % (ref 12.0–46.0)
Lymphs Abs: 2.2 10*3/uL (ref 0.7–4.0)
MCHC: 31.8 g/dL (ref 30.0–36.0)
MCV: 89.8 fl (ref 78.0–100.0)
Monocytes Absolute: 0.7 10*3/uL (ref 0.1–1.0)
Monocytes Relative: 9.6 % (ref 3.0–12.0)
Neutro Abs: 4.2 10*3/uL (ref 1.4–7.7)
Neutrophils Relative %: 56.3 % (ref 43.0–77.0)
Platelets: 325 10*3/uL (ref 150.0–400.0)
RBC: 3.62 Mil/uL — ABNORMAL LOW (ref 3.87–5.11)
RDW: 15.8 % — ABNORMAL HIGH (ref 11.5–15.5)
WBC: 7.5 10*3/uL (ref 4.0–10.5)

## 2017-11-24 LAB — LIPID PANEL
Cholesterol: 111 mg/dL (ref 0–200)
HDL: 44.2 mg/dL (ref >39.00)
LDL Cholesterol: 41 mg/dL (ref 0–99)
NonHDL: 67.27
Total CHOL/HDL Ratio: 3
Triglycerides: 130 mg/dL (ref 0.0–149.0)
VLDL: 26 mg/dL (ref 0.0–40.0)

## 2017-11-24 LAB — COMPREHENSIVE METABOLIC PANEL
ALT: 7 U/L (ref 0–35)
AST: 15 U/L (ref 0–37)
Albumin: 4.1 g/dL (ref 3.5–5.2)
Alkaline Phosphatase: 81 U/L (ref 39–117)
BUN: 19 mg/dL (ref 6–23)
CO2: 23 mEq/L (ref 19–32)
Calcium: 9.2 mg/dL (ref 8.4–10.5)
Chloride: 105 mEq/L (ref 96–112)
Creatinine, Ser: 0.85 mg/dL (ref 0.40–1.20)
GFR: 80.63 mL/min (ref >60.00)
Glucose, Bld: 139 mg/dL — ABNORMAL HIGH (ref 70–99)
Potassium: 3.4 mEq/L — ABNORMAL LOW (ref 3.5–5.1)
Sodium: 139 mEq/L (ref 135–145)
Total Bilirubin: 0.5 mg/dL (ref 0.2–1.2)
Total Protein: 7.2 g/dL (ref 6.0–8.3)

## 2017-11-24 LAB — POCT URINALYSIS DIPSTICK
Bilirubin, UA: NEGATIVE
Glucose, UA: NEGATIVE
Ketones, UA: NEGATIVE
Nitrite, UA: POSITIVE
Spec Grav, UA: 1.02 (ref 1.010–1.025)
Urobilinogen, UA: 0.2 E.U./dL
pH, UA: 6 (ref 5.0–8.0)

## 2017-11-24 LAB — FERRITIN: Ferritin: 33.4 ng/mL (ref 10.0–291.0)

## 2017-11-24 LAB — IRON: Iron: 28 ug/dL — ABNORMAL LOW (ref 42–145)

## 2017-11-24 LAB — HEMOGLOBIN A1C: Hgb A1c MFr Bld: 7.5 % — ABNORMAL HIGH (ref 4.6–6.5)

## 2017-11-24 MED ORDER — CEPHALEXIN 500 MG PO CAPS: 500.0000 mg | ORAL_CAPSULE | Freq: Two times a day (BID) | ORAL | 0 refills | Status: DC

## 2017-11-24 NOTE — Assessment & Plan Note (Signed)
Check lipid panel  Continue daily statin Regular exercise and healthy diet encouraged  

## 2017-11-24 NOTE — Assessment & Plan Note (Signed)
GERD controlled Continue daily medication  

## 2017-11-24 NOTE — Progress Notes (Signed)
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   MRN: [id number]  HPI The patient is here for follow up.  She is here with her daughter.  Diabetes: She is taking her medication daily as prescribed. She is not always compliant with a diabetic diet. She is not exercising regularly. She monitors her sugars and they have been running 100-150 in the morning.   Hypertension: She is taking her medication daily. She is compliant with a low sodium diet.  She denies chest pain, palpitations, frequent edema, shortness of breath and regular headaches. She is not exercising regularly.  She does monitor her blood pressure at home - this morning it was 130's/70's.  It is sometimes higher when she has her back pain, but typically controlled at home.  [name]    Hyperlipidemia: She is taking her medication daily. She is compliant with a low fat/cholesterol diet. She is not exercising regularly.   GERD:  She is taking her medication daily as prescribed.  She denies any GERD symptoms and feels her GERD is well controlled.   ? UTI:  For the past couple of days she has had dysuria and frequent urination.  There is some discoloration and order.  She denies fever and chills.    Medications and allergies reviewed with patient and updated if appropriate.  Patient Active Problem List   Diagnosis Date Noted  . Acute cystitis without hematuria 10/22/2017  . Left ankle pain 10/03/2017  . Moderate dementia with behavioral disturbance 03/21/2017  . Cerebral infarction ([place]) 12/27/2015  . Convulsions ([place]) 09/11/2015  . SVT (supraventricular tachycardia) ([place])   . Fall 08/21/2015  . PAF (paroxysmal atrial fibrillation) ([place])   . Stroke (cerebrum) ([place]) 08/20/2015  . Aphasia   . TIA (transient ischemic attack)   . FTT (failure to thrive) in adult   . Memory loss 02/24/2015  . Syncope 10/31/2014  . Diabetes mellitus type 2, controlled ([place]) 10/31/2014  . Uncontrolled hypertension  10/31/2014  . Incomplete emptying of bladder 05/17/2014  . Headache 11/26/2013  . CAD, NATIVE VESSEL 02/14/2010  . GERD 02/14/2010  . ANEMIA-B12 DEFICIENCY 01/01/2010  . OSTEOARTHRITIS 01/01/2010  . PERSONAL HX COLONIC POLYPS 01/01/2010  . Iron deficiency anemia 12/28/2009  . DIZZINESS AND GIDDINESS 12/28/2009  . Lower abdominal pain 09/13/2009  . Chronic lower back pain 08/11/2008  . KNEE PAIN, RIGHT 02/11/2008  . Dyslipidemia 04/23/2007  . Allergic rhinitis 04/23/2007  . GALLBLADDER DISEASE 04/23/2007  . DIVERTICULITIS, HX OF 04/23/2007    Current Outpatient Medications on File Prior to Visit  Medication Sig Dispense Refill  . amLODipine (NORVASC) 5 MG tablet Take 1 tablet (5 mg total) daily by mouth. Please keep upcoming appt in February for future refills. Thanks 90 tablet 0  . apixaban (ELIQUIS) 2.5 MG TABS tablet Take 1 tablet (2.5 mg total) by mouth 2 (two) times daily. 180 tablet 3  . atorvastatin (LIPITOR) 40 MG tablet Take 1 tablet (40 mg total) daily by mouth. Please keep upcoming appt in February for future refills. Thanks 90 tablet 0  . divalproex (DEPAKOTE ER) 250 MG 24 hr tablet Take 1 tablet (250 mg total) by mouth daily. 30 tablet 6  . donepezil (ARICEPT) 10 MG tablet Take 1 tablet (10 mg total) by mouth at bedtime. 90 tablet 3  . glucose blood (ONE TOUCH ULTRA TEST) test strip Use to check blood sugar 1 time per day. Dx Code: E11.9 100 each 12  .  metFORMIN (GLUCOPHAGE) 500 MG tablet TAKE 1 TABLET BY MOUTH TWICE DAILY WITH MEALS (NEEDS  APPOINTMENT) 180 tablet 0  . metoprolol succinate (TOPROL-XL) 50 MG 24 hr tablet TAKE 1 TABLET BY MOUTH DAILY. TAKE WITH OR IMMEDIATELY FOLLOWING A MEAL. 90 tablet 3  . Na Sulfate-K Sulfate-Mg Sulf 17.5-3.13-1.6 GM/180ML SOLN Suprep-Use as directed 354 mL 0  . nitroGLYCERIN (NITROSTAT) 0.4 MG SL tablet Place 0.4 mg under the tongue every 5 (five) minutes as needed for chest pain. Reported on 12/21/2015    . omeprazole (PRILOSEC) 40 MG  capsule Take 1 capsule (40 mg total) by mouth daily. 90 capsule 0  . ondansetron (ZOFRAN ODT) 8 MG disintegrating tablet Take 1 tablet (8 mg total) by mouth every 8 (eight) hours as needed for nausea or vomiting. 10 tablet 0  . ONETOUCH DELICA LANCETS 86P MISC Use 1 lancet to check glucose once daily 100 each 1  . OVER THE COUNTER MEDICATION Place 1 patch onto the skin daily as needed (pain). Arthritis pain reliever patch    . potassium chloride (KLOR-CON 10) 10 MEQ tablet Take 1 tablet (10 mEq total) daily by mouth. Please keep upcoming appt in February for future refills. Thanks 90 tablet 0   No current facility-administered medications on file prior to visit.     Past Medical History:  Diagnosis Date  . Abdominal pain   . Allergic rhinitis, cause unspecified   . Anemia B twelve deficiency   . Arrhythmia    paroxysmal atrial fibrillation  . Arthritis    "arms and hands" (06/30/2013)  . Benign neoplasm of colon   . Colon polyp   . Coronary artery disease   . Degeneration of lumbar or lumbosacral intervertebral disc   . Dizziness - giddy   . Edema   . GERD (gastroesophageal reflux disease)   . Hyperlipidemia   . Hypertension   . Iron deficiency anemia, unspecified   . Lumbago   . Pernicious anemia   . Seizures ([place])   . Stroke ([place])   . Symptomatic menopausal or female climacteric states   . Thoracic or lumbosacral neuritis or radiculitis, unspecified   . Type II diabetes mellitus ([place])     Past Surgical History:  Procedure Laterality Date  . ABDOMINAL HYSTERECTOMY  1974  . APPENDECTOMY  1974  . CARDIAC CATHETERIZATION    . CARPAL TUNNEL RELEASE Bilateral 1970's  . CATARACT EXTRACTION, BILATERAL Bilateral   . CHOLECYSTECTOMY    . CORONARY ANGIOPLASTY WITH STENT PLACEMENT     "1" (06/30/2013  . LUMBAR DISC SURGERY      Social History   Socioeconomic History  . Marital status: Widowed    Spouse name: Not on file  . Number of children: 8  . Years of education: 10th  Grade  . Highest education level: Not on file  Social Needs  . Financial resource strain: Not on file  . Food insecurity - worry: Not on file  . Food insecurity - inability: Not on file  . Transportation needs - medical: Not on file  . Transportation needs - non-medical: Not on file  Occupational History  . Not on file  Tobacco Use  . Smoking status: Never Smoker  . Smokeless tobacco: Current User    Types: Snuff  Substance and Sexual Activity  . Alcohol use: No    Alcohol/week: 0.0 oz  . Drug use: No  . Sexual activity: No  Other Topics Concern  . Not on file  Social History Narrative  Patient lives with daughter in a two story home.   Patient has a 10th grade education.   Patient is retired.   Patient is right handed.    Family History  Problem Relation Age of Onset  . Breast cancer Sister        "behind heart"  . Colon cancer Sister   . Colon cancer Sister   . Breast cancer Daughter   . Breast cancer Daughter   . Breast cancer Daughter   . Kidney cancer Daughter     Review of Systems  Constitutional: Negative for chills and fever.  Respiratory: Negative for cough, shortness of breath and wheezing.   Cardiovascular: Positive for leg swelling (chronic - one leg since cva - intemrittent, mild). Negative for chest pain and palpitations.  Gastrointestinal: Positive for abdominal pain and nausea.  Genitourinary: Positive for dysuria and frequency. Negative for hematuria.       Urine odor  Neurological: Positive for light-headedness (occ). Negative for headaches.       Objective:   Vitals:   11/24/17 1009  BP: (!) 174/80  Pulse: 71  Resp: 16  SpO2: 97%   Wt Readings from Last 3 Encounters:  11/24/17 108 lb (49 kg)  10/03/17 115 lb (52.2 kg)  09/18/17 111 lb (50.3 kg)   Body mass index is 20.41 kg/m.   Physical Exam    Constitutional: Appears well-developed and well-nourished. No distress.  HENT:  Head: Normocephalic and atraumatic.  Neck: Neck  supple. No tracheal deviation present. No thyromegaly present.  No cervical lymphadenopathy Cardiovascular: Normal rate, regular rhythm and normal heart sounds.   No murmur heard.  No edema Pulmonary/Chest: Effort normal and breath sounds normal. No respiratory distress. No has no wheezes. No rales.  Abdomen: Soft, tenderness in lower abdomen-chronic, no distention Skin: Skin is warm and dry. Not diaphoretic.  Psychiatric: Normal mood and affect. Behavior is normal.      Assessment & Plan:    See Problem List for Assessment and Plan of chronic medical problems.

## 2017-11-24 NOTE — Assessment & Plan Note (Signed)
Has had GI evaluation in the past and has an upcoming appointment Will recheck CBC, iron, ferritin

## 2017-11-24 NOTE — Patient Instructions (Signed)
  Test(s) ordered today. Your results will be released to [place] (or called to you) after review, usually within 72hours after test completion. If any changes need to be made, you will be notified at that same time.  Medications reviewed and updated.  Changes include starting an antibiotic - Keflex for your bladder infection.  Your prescription(s) have been submitted to your pharmacy. Please take as directed and contact our office if you believe you are having problem(s) with the medication(s).   Please followup in 6 months

## 2017-11-24 NOTE — Assessment & Plan Note (Signed)
Blood pressure elevated here today, but better controlled at home-her pain level does influence her blood pressure. Given her blood pressure is fairly controlled on average I will not adjust her medication given her age Occasion to current doses CMP

## 2017-11-24 NOTE — Assessment & Plan Note (Signed)
Sugars fairly controlled at home We will check A1c We will adjust medication if needed

## 2017-11-24 NOTE — Assessment & Plan Note (Signed)
Urine dip consistent with probable UTI Start Keflex twice daily x5 days Call if symptoms do not resolve

## 2017-11-25 LAB — URINE CULTURE
MICRO NUMBER:: 90053792
SPECIMEN QUALITY:: ADEQUATE

## 2017-11-28 ENCOUNTER — Ambulatory Visit: Payer: Medicare Other | Admitting: Internal Medicine

## 2017-12-05 ENCOUNTER — Other Ambulatory Visit: Payer: Self-pay | Admitting: Cardiovascular Disease

## 2017-12-09 NOTE — Progress Notes (Signed)
Chief Complaint  Patient presents with  . Follow-up    History of Present Illness: 84 yo female with history of CAD, atrial fibrillation, HTN, HLD, syncope who is here today for cardiac follow up. Last cath April 2011 with bare metal stent patent in LAD, moderate disease in mid Circumflex, mild disease RCA. She had not been on anti-coagulation due to advanced age and fall risk but had a stroke in October 2016 and was placed on Eliquis.  Statin restarted after CVA.   She is here today for follow up. The patient denies any chest pain, dyspnea, palpitations, lower extremity edema, orthopnea, PND, dizziness, near syncope or syncope.   Primary Care Physician: Binnie Rail, MD  Past Medical History:  Diagnosis Date  . Abdominal pain   . Allergic rhinitis, cause unspecified   . Anemia B twelve deficiency   . Arrhythmia    paroxysmal atrial fibrillation  . Arthritis    "arms and hands" (06/30/2013)  . Benign neoplasm of colon   . Colon polyp   . Coronary artery disease   . Degeneration of lumbar or lumbosacral intervertebral disc   . Dizziness - giddy   . Edema   . GERD (gastroesophageal reflux disease)   . Hyperlipidemia   . Hypertension   . Iron deficiency anemia, unspecified   . Lumbago   . Pernicious anemia   . Seizures ([place])   . Stroke ([place])   . Symptomatic menopausal or female climacteric states   . Thoracic or lumbosacral neuritis or radiculitis, unspecified   . Type II diabetes mellitus ([place])     Past Surgical History:  Procedure Laterality Date  . ABDOMINAL HYSTERECTOMY  1974  . APPENDECTOMY  1974  . CARDIAC CATHETERIZATION    . CARPAL TUNNEL RELEASE Bilateral 1970's  . CATARACT EXTRACTION, BILATERAL Bilateral   . CHOLECYSTECTOMY    . CORONARY ANGIOPLASTY WITH STENT PLACEMENT     "1" (06/30/2013  . LUMBAR DISC SURGERY      Current Outpatient Medications  Medication Sig Dispense Refill  . amLODipine (NORVASC) 5 MG tablet Take 1 tablet (5 mg total) daily by  mouth. Please keep upcoming appt in February for future refills. Thanks 90 tablet 0  . apixaban (ELIQUIS) 2.5 MG TABS tablet Take 1 tablet (2.5 mg total) by mouth 2 (two) times daily. 180 tablet 3  . atorvastatin (LIPITOR) 40 MG tablet Take 1 tablet (40 mg total) daily by mouth. Please keep upcoming appt in February for future refills. Thanks 90 tablet 0  . cephALEXin (KEFLEX) 500 MG capsule Take 1 capsule (500 mg total) by mouth 2 (two) times daily. 10 capsule 0  . divalproex (DEPAKOTE ER) 250 MG 24 hr tablet Take 1 tablet (250 mg total) by mouth daily. 30 tablet 6  . donepezil (ARICEPT) 10 MG tablet Take 1 tablet (10 mg total) by mouth at bedtime. 90 tablet 3  . glucose blood (ONE TOUCH ULTRA TEST) test strip Use to check blood sugar 1 time per day. Dx Code: E11.9 100 each 12  . metFORMIN (GLUCOPHAGE) 500 MG tablet TAKE 1 TABLET BY MOUTH TWICE DAILY WITH MEALS (NEEDS  APPOINTMENT) 180 tablet 0  . metoprolol succinate (TOPROL-XL) 50 MG 24 hr tablet TAKE 1 TABLET BY MOUTH DAILY. TAKE WITH OR IMMEDIATELY FOLLOWING A MEAL. 90 tablet 0  . nitroGLYCERIN (NITROSTAT) 0.4 MG SL tablet Place 0.4 mg under the tongue every 5 (five) minutes as needed for chest pain. Reported on 12/21/2015    .  ondansetron (ZOFRAN ODT) 8 MG disintegrating tablet Take 1 tablet (8 mg total) by mouth every 8 (eight) hours as needed for nausea or vomiting. 10 tablet 0  . ONETOUCH DELICA LANCETS 75Z MISC Use 1 lancet to check glucose once daily 100 each 1  . OVER THE COUNTER MEDICATION Place 1 patch onto the skin daily as needed (pain). Arthritis pain reliever patch    . potassium chloride (KLOR-CON 10) 10 MEQ tablet Take 1 tablet (10 mEq total) daily by mouth. Please keep upcoming appt in February for future refills. Thanks 90 tablet 0   No current facility-administered medications for this visit.     Allergies  Allergen Reactions  . Penicillins Swelling    Hy proceed with Cephalosporin use.  . Sulfonamide Derivatives Other (See  Comments)    REACTION: "like my head was full of water"  . Albumin (Human) Other (See Comments)    Doesn't remember   . Clindamycin Other (See Comments)    Doesn't remember   . Iodides Hives  . Iodinated Diagnostic Agents Hives  . Sulfa Antibiotics Hives  . Celecoxib Itching and Rash  . Erythromycin Itching and Rash  . Nitrofuran Derivatives Rash  . Nitrofurantoin Itching and Rash  . Piroxicam Other (See Comments)    unknown  . Povidone-Iodine Itching and Rash    Social History   Socioeconomic History  . Marital status: Widowed    Spouse name: Not on file  . Number of children: 8  . Years of education: 10th Grade  . Highest education level: Not on file  Social Needs  . Financial resource strain: Not on file  . Food insecurity - worry: Not on file  . Food insecurity - inability: Not on file  . Transportation needs - medical: Not on file  . Transportation needs - non-medical: Not on file  Occupational History  . Not on file  Tobacco Use  . Smoking status: Never Smoker  . Smokeless tobacco: Current User    Types: Snuff  Substance and Sexual Activity  . Alcohol use: No    Alcohol/week: 0.0 oz  . Drug use: No  . Sexual activity: No  Other Topics Concern  . Not on file  Social History Narrative   Patient lives with daughter in a two story home.   Patient has a 10th grade education.   Patient is retired.   Patient is right handed.    Family History  Problem Relation Age of Onset  . Breast cancer Sister        "behind heart"  . Colon cancer Sister   . Colon cancer Sister   . Breast cancer Daughter   . Breast cancer Daughter   . Breast cancer Daughter   . Kidney cancer Daughter     Review of Systems:  As stated in the HPI and otherwise negative.   BP 124/60   Pulse 71   Ht 5\' 1"  (1.549 m)   Wt 110 lb 6.4 oz (50.1 kg)   SpO2 97%   BMI 20.86 kg/m   Physical Examination:  General: Well developed, well nourished, NAD  HEENT: OP clear, mucus membranes  moist  SKIN: warm, dry. No rashes. Neuro: No focal deficits  Musculoskeletal: Muscle strength 5/5 all ext  Psychiatric: Mood and affect normal  Neck: No JVD, no carotid bruits, no thyromegaly, no lymphadenopathy.  Lungs:Clear bilaterally, no wheezes, rhonci, crackles Cardiovascular: Regular rate and rhythm. Soft systolic murmur.  Abdomen:Soft. Bowel sounds present. Non-tender.  Extremities: No lower  extremity edema. Pulses are 2 + in the bilateral DP/PT.  EKG:  EKG is ordered today. The ekg ordered today demonstrates Sinus, rate 71 bpm. 1st degree AV block. Poor R wave progression precordial leads.   Recent Labs: 11/24/2017: ALT 7; BUN 19; Creatinine, Ser 0.85; Hemoglobin 10.3; Platelets 325.0; Potassium 3.4; Sodium 139   Lipid Panel    Component Value Date/Time   CHOL 111 11/24/2017 1038   TRIG 130.0 11/24/2017 1038   TRIG 80 09/04/2006 0837   HDL 44.20 11/24/2017 1038   CHOLHDL 3 11/24/2017 1038   VLDL 26.0 11/24/2017 1038   LDLCALC 41 11/24/2017 1038   LDLDIRECT 155.3 08/11/2008 1122     Wt Readings from Last 3 Encounters:  12/15/17 110 lb 6.4 oz (50.1 kg)  12/10/17 110 lb (49.9 kg)  11/24/17 108 lb (49 kg)     Other studies Reviewed: Additional studies/ records that were reviewed today include: . Review of the above records demonstrates:    Assessment and Plan:   1. CAD without angina: She has had no chest pain suggestive of angina. Continue beta blocker and statin. She is not on an ASA since she is on Eliquis.      2. Paroxysmal atrial fibrillation: She is in sinus. Will continue beta blocker and Eliquis.     3. HTN: BP is well controlled. No changes.   4. Hyperlipidemia: Continue statin.      Current medicines are reviewed at length with the patient today.  The patient does not have concerns regarding medicines.  The following changes have been made:  no change  Labs/ tests ordered today include:   Orders Placed This Encounter  Procedures  . EKG 12-Lead     Disposition:   FU with me in 12  months  Signed, Lauree Chandler, MD 12/15/2017 8:50 AM    [facility] [place], [place], [place]  [address] Phone: [phone]; Fax: [phone]

## 2017-12-10 ENCOUNTER — Encounter: Payer: Self-pay | Admitting: Internal Medicine

## 2017-12-10 ENCOUNTER — Ambulatory Visit (INDEPENDENT_AMBULATORY_CARE_PROVIDER_SITE_OTHER): Payer: Medicare Other | Admitting: Internal Medicine

## 2017-12-10 ENCOUNTER — Ambulatory Visit: Payer: Medicare Other | Admitting: Internal Medicine

## 2017-12-10 ENCOUNTER — Ambulatory Visit (INDEPENDENT_AMBULATORY_CARE_PROVIDER_SITE_OTHER): Payer: Medicare Other | Admitting: Podiatry

## 2017-12-10 ENCOUNTER — Encounter: Payer: Self-pay | Admitting: Podiatry

## 2017-12-10 VITALS — BP 152/62 | HR 80 | Ht 61.0 in | Wt 110.0 lb

## 2017-12-10 DIAGNOSIS — M79674 Pain in right toe(s): Secondary | ICD-10-CM | POA: Diagnosis not present

## 2017-12-10 DIAGNOSIS — M79675 Pain in left toe(s): Secondary | ICD-10-CM

## 2017-12-10 DIAGNOSIS — R1031 Right lower quadrant pain: Secondary | ICD-10-CM | POA: Diagnosis not present

## 2017-12-10 DIAGNOSIS — R197 Diarrhea, unspecified: Secondary | ICD-10-CM

## 2017-12-10 DIAGNOSIS — E119 Type 2 diabetes mellitus without complications: Secondary | ICD-10-CM

## 2017-12-10 DIAGNOSIS — D689 Coagulation defect, unspecified: Secondary | ICD-10-CM

## 2017-12-10 DIAGNOSIS — B351 Tinea unguium: Secondary | ICD-10-CM

## 2017-12-10 NOTE — Progress Notes (Signed)
Complaint:  Visit Type: Patient returns to my office for continued preventative foot care services. Complaint: Patient states" my nails have grown long and thick and become painful to walk and wear shoes" Patient has been diagnosed with DM with no foot complications. The patient presents for preventative foot care services. No changes to ROS  Podiatric Exam: Vascular: dorsalis pedis and posterior tibial pulses are palpable bilateral. Capillary return is immediate. Temperature gradient is WNL. Skin turgor WNL  Sensorium: Normal Semmes Weinstein monofilament test. Normal tactile sensation bilaterally. Nail Exam: Pt has thick disfigured discolored nails with subungual debris noted bilateral entire nail hallux through fifth toenails Ulcer Exam: There is no evidence of ulcer or pre-ulcerative changes or infection. Orthopedic Exam: Muscle tone and strength are WNL. No limitations in general ROM. No crepitus or effusions noted. Foot type and digits show no abnormalities. Bony prominences are unremarkable. Skin: No Porokeratosis. No infection or ulcers  Diagnosis:  Onychomycosis, , Pain in right toe, pain in left toes  Treatment & Plan Procedures and Treatment: Consent by patient was obtained for treatment procedures.   Debridement of mycotic and hypertrophic toenails, 1 through 5 bilateral and clearing of subungual debris. No ulceration, no infection noted.  Return Visit-Office Procedure: Patient instructed to return to the office for a follow up visit 3 months for continued evaluation and treatment.    Yanky Vanderburg DPM 

## 2017-12-10 NOTE — Patient Instructions (Signed)
As discussed with Dr. Henrene Pastor:  Stop Aricept for one week.  If this helps the loose stools, speak to her doctor about a change in medicine.    If this does not make a difference, stop Metformin for one week.  If this helps, speak to her doctor about a change in medicine.  Begin taking 1 tablespoon of Metamucil daily  You may take 1 Imodium daily

## 2017-12-11 ENCOUNTER — Encounter: Payer: Self-pay | Admitting: Internal Medicine

## 2017-12-11 NOTE — Progress Notes (Signed)
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 previous patient of Dr. Olevia Perches. Seen last summer by the GI nurse practitioner. Problems with abdominal discomfort and loose stools. She presents today regarding the same. She is accompanied by her daughter. Previous colonoscopy 2011 with non-advanced adenoma, diverticulosis, and hemorrhoids. CT scan September 2018 was negative except for diverticulosis. Laboratories several weeks ago including comprehensive metabolic panel and CBC were unremarkable except for chronic stable anemia with hemoglobin 10.3. She is accompanied today by her daughter. They report 3 or 4 loose bowel movements per day without incontinence. Some intermittent abdominal cramping. Multiple medications including metformin and Aricept. She has not tried any symptomatic therapies. No bleeding. No weight loss. GI review of systems otherwise negative  REVIEW OF SYSTEMS:  All non-GI ROS negative unless otherwise stated in the history of present illness except for back pain  Past Medical History:  Diagnosis Date  . Abdominal pain   . Allergic rhinitis, cause unspecified   . Anemia B twelve deficiency   . Arrhythmia    paroxysmal atrial fibrillation  . Arthritis    "arms and hands" (06/30/2013)  . Benign neoplasm of colon   . Colon polyp   . Coronary artery disease   . Degeneration of lumbar or lumbosacral intervertebral disc   . Dizziness - giddy   . Edema   . GERD (gastroesophageal reflux disease)   . Hyperlipidemia   . Hypertension   . Iron deficiency anemia, unspecified   . Lumbago   . Pernicious anemia   . Seizures ([place])   . Stroke ([place])   . Symptomatic menopausal or female climacteric states   . Thoracic or lumbosacral neuritis or radiculitis, unspecified   . Type II diabetes mellitus ([place])     Past Surgical History:  Procedure Laterality Date  . ABDOMINAL HYSTERECTOMY  1974  . APPENDECTOMY  1974   . CARDIAC CATHETERIZATION    . CARPAL TUNNEL RELEASE Bilateral 1970's  . CATARACT EXTRACTION, BILATERAL Bilateral   . CHOLECYSTECTOMY    . CORONARY ANGIOPLASTY WITH STENT PLACEMENT     "1" (06/30/2013  . [place] Hoffmann  reports that  has never smoked. Her smokeless tobacco use includes snuff. She reports that she does not drink alcohol or use drugs.  family history includes Breast cancer in her daughter, daughter, daughter, and sister; Colon cancer in her sister and sister; Kidney cancer in her daughter.       PHYSICAL EXAMINATION: Vital signs: BP (!) 152/62   Pulse 80   Ht 5\' 1"  (1.549 m)   Wt 110 lb (49.9 kg)   BMI 20.78 kg/m   Constitutional: thin, elderly, but generally well-appearing, no acute distress Psychiatric: alert and oriented x3, cooperative Eyes: extraocular movements intact, anicteric, conjunctiva pink Mouth: oral pharynx moist, no lesions Neck: supple no lymphadenopathy Cardiovascular: heart regular rate and rhythm, no murmur Lungs: clear to auscultation bilaterally Abdomen: soft, nontender, nondistended, no obvious ascites, no peritoneal signs, normal bowel sounds, no organomegaly Rectal:no external abnormalities. Reasonable tone. Soft brown Hemoccult negative stool. No tenderness or mass Extremities: no clubbing, cyanosis, or lower extremity edema bilaterally Skin: no lesions on visible extremities Neuro: No focal deficits. Cranial nerves intact  ASSESSMENT:  #1. Intermittent abdominal cramping and loose stools for 6-8 months. No worrisome features. Question medication effect. Negative workup including laboratories and imaging #2. Multiple medical problems   PLAN:  #1.  Under the supervision of her primary physician, hold metformin for one week to see if this improves problem with loose stools and abdominal cramping. If so, see alternative therapy. If not, #2. Resume metformin and then Hold Aricept for 1 week.  If this improves the problem with loose stools and abdominal cramping, then seek alternative therapy. If not, #3. Resume Aricept and initiate daily fiber supplementation with Metamucil one to 2 tablespoons and antidiarrheals including Imodium one daily. Adjust to improve symptoms and hold for unwanted constipation #4. If problems persist despite all these measures, GI follow-up. Otherwise resume general medical care with PCP  25 minutes spent face-to-face with the patient. Granted 50% a time use for counseling her and her daughter regarding management of her abdominal cramping and loose bowels as detailed above

## 2017-12-15 ENCOUNTER — Encounter: Payer: Self-pay | Admitting: Cardiovascular Disease

## 2017-12-15 ENCOUNTER — Ambulatory Visit (INDEPENDENT_AMBULATORY_CARE_PROVIDER_SITE_OTHER): Payer: Medicare Other | Admitting: Cardiovascular Disease

## 2017-12-15 ENCOUNTER — Other Ambulatory Visit: Payer: Self-pay | Admitting: Endocrinology

## 2017-12-15 VITALS — BP 124/60 | HR 71 | Ht 61.0 in | Wt 110.4 lb

## 2017-12-15 DIAGNOSIS — E7800 Pure hypercholesterolemia, unspecified: Secondary | ICD-10-CM | POA: Diagnosis not present

## 2017-12-15 DIAGNOSIS — I480 Paroxysmal atrial fibrillation: Secondary | ICD-10-CM | POA: Diagnosis not present

## 2017-12-15 DIAGNOSIS — I2510 Atherosclerotic heart disease of native coronary artery without angina pectoris: Secondary | ICD-10-CM | POA: Diagnosis not present

## 2017-12-15 DIAGNOSIS — I10 Essential (primary) hypertension: Secondary | ICD-10-CM | POA: Diagnosis not present

## 2017-12-15 NOTE — Patient Instructions (Signed)

## 2017-12-16 NOTE — Telephone Encounter (Signed)
Last ov 08/12/16 1 no show and no future scheduled refill or refuse please advise

## 2017-12-16 NOTE — Telephone Encounter (Signed)
No longer a patient here

## 2017-12-17 ENCOUNTER — Other Ambulatory Visit: Payer: Self-pay | Admitting: Endocrinology

## 2017-12-18 ENCOUNTER — Telehealth: Payer: Self-pay | Admitting: Internal Medicine

## 2017-12-18 ENCOUNTER — Ambulatory Visit: Payer: Medicare Other | Admitting: Family Medicine

## 2017-12-18 MED ORDER — METFORMIN HCL 500 MG PO TABS: 500.0000 mg | ORAL_TABLET | Freq: Two times a day (BID) | ORAL | 1 refills | Status: DC

## 2017-12-18 NOTE — Telephone Encounter (Signed)
Reviewed chart pt is up-to-date sent refills to [facility].../lmb  

## 2017-12-18 NOTE — Telephone Encounter (Signed)
Metformin refill Last OV: 11/24/17 but sees PCP today Last Refill:09/18/17 Pharmacy:[facility] [address] Last Hgb A1C 11/24/17   7.5 Routing back to provider to address Metformin at [place] today

## 2017-12-18 NOTE — Telephone Encounter (Signed)
Copied from [place] #[id number]. Topic: Quick Communication - Rx Refill/Question >> Dec 18, 2017 10:10 AM Cleaster Corin, NT wrote: Medication: metformin   Has the patient contacted their pharmacy? yes   (Agent: If no, request that the patient contact the pharmacy for the refill.)   Preferred Pharmacy (with phone number or street name): [facility] ([address]), [place] - [address] [place] ([place]) [place] [address] Phone: [phone] Fax: [phone]     Agent: Please be advised that RX refills may take up to 3 business days. We ask that you follow-up with your pharmacy.

## 2017-12-22 ENCOUNTER — Other Ambulatory Visit: Payer: Self-pay | Admitting: Cardiovascular Disease

## 2017-12-22 NOTE — Telephone Encounter (Signed)
Pt last saw Dr Angelena Form 12/15/17, last labs 11/24/17 Creat 0.85, age 84, weight 50.1kg, based on specified criteria pt is on appropriate dosage.  Will refill rx.

## 2017-12-24 ENCOUNTER — Other Ambulatory Visit: Payer: Self-pay | Admitting: Endocrinology

## 2017-12-24 ENCOUNTER — Other Ambulatory Visit: Payer: Self-pay

## 2017-12-24 MED ORDER — POTASSIUM CHLORIDE ER 10 MEQ PO TBCR: 10.0000 meq | EXTENDED_RELEASE_TABLET | Freq: Every day | ORAL | 3 refills | Status: DC

## 2018-02-03 ENCOUNTER — Encounter (HOSPITAL_COMMUNITY): Payer: Self-pay | Admitting: *Deleted

## 2018-02-03 ENCOUNTER — Emergency Department (HOSPITAL_COMMUNITY): Payer: Medicare Other

## 2018-02-03 ENCOUNTER — Emergency Department (HOSPITAL_COMMUNITY)
Admission: EM | Admit: 2018-02-03 | Discharge: 2018-02-03 | Disposition: A | Discharge status: Home / Self Care | Payer: Medicare Other | Attending: Emergency Medicine | Admitting: Emergency Medicine

## 2018-02-03 ENCOUNTER — Other Ambulatory Visit: Payer: Self-pay

## 2018-02-03 DIAGNOSIS — I2510 Atherosclerotic heart disease of native coronary artery without angina pectoris: Secondary | ICD-10-CM | POA: Insufficient documentation

## 2018-02-03 DIAGNOSIS — E119 Type 2 diabetes mellitus without complications: Secondary | ICD-10-CM | POA: Diagnosis not present

## 2018-02-03 DIAGNOSIS — D649 Anemia, unspecified: Secondary | ICD-10-CM | POA: Diagnosis not present

## 2018-02-03 DIAGNOSIS — I10 Essential (primary) hypertension: Secondary | ICD-10-CM | POA: Diagnosis not present

## 2018-02-03 DIAGNOSIS — Z8673 Personal history of transient ischemic attack (TIA), and cerebral infarction without residual deficits: Secondary | ICD-10-CM | POA: Insufficient documentation

## 2018-02-03 DIAGNOSIS — D599 Acquired hemolytic anemia, unspecified: Secondary | ICD-10-CM | POA: Diagnosis not present

## 2018-02-03 DIAGNOSIS — Z7901 Long term (current) use of anticoagulants: Secondary | ICD-10-CM | POA: Insufficient documentation

## 2018-02-03 DIAGNOSIS — R0789 Other chest pain: Secondary | ICD-10-CM

## 2018-02-03 DIAGNOSIS — R079 Chest pain, unspecified: Secondary | ICD-10-CM | POA: Diagnosis present

## 2018-02-03 DIAGNOSIS — Z7984 Long term (current) use of oral hypoglycemic drugs: Secondary | ICD-10-CM | POA: Insufficient documentation

## 2018-02-03 DIAGNOSIS — J984 Other disorders of lung: Secondary | ICD-10-CM | POA: Diagnosis not present

## 2018-02-03 DIAGNOSIS — Z79899 Other long term (current) drug therapy: Secondary | ICD-10-CM | POA: Insufficient documentation

## 2018-02-03 LAB — BASIC METABOLIC PANEL
Anion gap: 8 (ref 5–15)
BUN: 18 mg/dL (ref 6–20)
CO2: 21 mmol/L — ABNORMAL LOW (ref 22–32)
Calcium: 9.5 mg/dL (ref 8.9–10.3)
Chloride: 109 mmol/L (ref 101–111)
Creatinine, Ser: 0.76 mg/dL (ref 0.44–1.00)
GFR calc Af Amer: >60 mL/min (ref >60)
GFR calc non Af Amer: >60 mL/min (ref >60)
Glucose, Bld: 105 mg/dL — ABNORMAL HIGH (ref 65–99)
Potassium: 3.7 mmol/L (ref 3.5–5.1)
Sodium: 138 mmol/L (ref 135–145)

## 2018-02-03 LAB — I-STAT TROPONIN, ED
Troponin i, poc: 0 ng/mL (ref 0.00–0.08)
Troponin i, poc: 0.02 ng/mL (ref 0.00–0.08)
Troponin i, poc: 0 ng/mL (ref 0.00–0.08)

## 2018-02-03 LAB — CBC
HCT: 31.5 % — ABNORMAL LOW (ref 36.0–46.0)
Hemoglobin: 9.9 g/dL — ABNORMAL LOW (ref 12.0–15.0)
MCH: 28 pg (ref 26.0–34.0)
MCHC: 31.4 g/dL (ref 30.0–36.0)
MCV: 89.2 fL (ref 78.0–100.0)
Platelets: 283 10*3/uL (ref 150–400)
RBC: 3.53 MIL/uL — ABNORMAL LOW (ref 3.87–5.11)
RDW: 16.2 % — ABNORMAL HIGH (ref 11.5–15.5)
WBC: 5.6 10*3/uL (ref 4.0–10.5)

## 2018-02-03 NOTE — ED Notes (Signed)
Pt verbalized understanding of all d/c instructions and f/u information. VSS. All belongings with patient at this time. Pt wheeled to lobby by this RN

## 2018-02-03 NOTE — ED Notes (Signed)
Pt getting dressed at this time.

## 2018-02-03 NOTE — ED Notes (Signed)
ED Provider at bedside. 

## 2018-02-03 NOTE — ED Triage Notes (Signed)
Pt states she is having a feeling in her left chest that she cannot describe and she just doesn't feel good in her chest. No radiation of the feeling.  No sob.  Pt just feels uncomfortable.  Pt is alert and oriented

## 2018-02-03 NOTE — ED Provider Notes (Signed)
[facility] EMERGENCY DEPARTMENT Provider Note   CSN: [id number] Arrival date & time: 02/03/18  7680     History   Chief Complaint Chief Complaint  Patient presents with  . Chest Pain    HPI [name] is a 84 y.o. female.  HPI 84 year old female history of arrhythmia, hypertension, hyperlipidemia, seizures, stroke, type 2 diabetes presents today with an episode of not feeling well.  She states she does not feel her chest felt right but is unable to give me a good description.  She points to her upper abdomen and her lower chest.  She had some nausea but no vomiting.  She denies cough, fever, chills and has not had similar symptoms in the past.  She is unclear as to whether or not she is currently having discomfort but states she feels better than before.  Past Medical History:  Diagnosis Date  . Abdominal pain   . Allergic rhinitis, cause unspecified   . Anemia B twelve deficiency   . Arrhythmia    paroxysmal atrial fibrillation  . Arthritis    "arms and hands" (06/30/2013)  . Benign neoplasm of colon   . Colon polyp   . Coronary artery disease   . Degeneration of lumbar or lumbosacral intervertebral disc   . Dizziness - giddy   . Edema   . GERD (gastroesophageal reflux disease)   . Hyperlipidemia   . Hypertension   . Iron deficiency anemia, unspecified   . Lumbago   . Pernicious anemia   . Seizures ([place])   . Stroke ([place])   . Symptomatic menopausal or female climacteric states   . Thoracic or lumbosacral neuritis or radiculitis, unspecified   . Type II diabetes mellitus [facility])     Patient Active Problem List   Diagnosis Date Noted  . Acute cystitis without hematuria 10/22/2017  . Left ankle pain 10/03/2017  . Moderate dementia with behavioral disturbance 03/21/2017  . Cerebral infarction ([place]) 12/27/2015  . Convulsions ([place]) 09/11/2015  . SVT (supraventricular tachycardia) ([place])   . Fall 08/21/2015  . PAF (paroxysmal atrial  fibrillation) ([place])   . Stroke (cerebrum) ([place]) 08/20/2015  . Aphasia   . TIA (transient ischemic attack)   . FTT (failure to thrive) in adult   . Syncope 10/31/2014  . Diabetes mellitus type 2, controlled ([place]) 10/31/2014  . Uncontrolled hypertension 10/31/2014  . Incomplete emptying of bladder 05/17/2014  . Headache 11/26/2013  . Urinary tract infection without hematuria 08/20/2011  . CAD, NATIVE VESSEL 02/14/2010  . GERD 02/14/2010  . ANEMIA-B12 DEFICIENCY 01/01/2010  . OSTEOARTHRITIS 01/01/2010  . PERSONAL HX COLONIC POLYPS 01/01/2010  . Iron deficiency anemia 12/28/2009  . DIZZINESS AND GIDDINESS 12/28/2009  . Lower abdominal pain 09/13/2009  . Chronic lower back pain 08/11/2008  . KNEE PAIN, RIGHT 02/11/2008  . Dyslipidemia 04/23/2007  . Allergic rhinitis 04/23/2007  . GALLBLADDER DISEASE 04/23/2007  . DIVERTICULITIS, HX OF 04/23/2007    Past Surgical History:  Procedure Laterality Date  . ABDOMINAL HYSTERECTOMY  1974  . APPENDECTOMY  1974  . CARDIAC CATHETERIZATION    . CARPAL TUNNEL RELEASE Bilateral 1970's  . CATARACT EXTRACTION, BILATERAL Bilateral   . CHOLECYSTECTOMY    . CORONARY ANGIOPLASTY WITH STENT PLACEMENT     "1" (06/30/2013  . LUMBAR DISC SURGERY       OB History   None      Home Medications    Prior to Admission medications   Medication Sig Start Date End Date Taking?  Authorizing Provider  acetaminophen (TYLENOL) 325 MG tablet Take 650 mg by mouth every 6 (six) hours as needed for mild pain.   Yes [provider]  amLODipine (NORVASC) 5 MG tablet Take 1 tablet (5 mg total) daily by mouth. Please keep upcoming appt in February for future refills. Thanks 09/30/17  Yes Burnell Blanks, MD  atorvastatin (LIPITOR) 40 MG tablet Take 1 tablet (40 mg total) daily by mouth. Please keep upcoming appt in February for future refills. Thanks Patient taking differently: Take 40 mg by mouth every evening. Please keep upcoming appt in February  for future refills. Thanks 09/30/17  Yes Burnell Blanks, MD  divalproex (DEPAKOTE ER) 250 MG 24 hr tablet Take 1 tablet (250 mg total) by mouth daily. 03/17/17  Yes Cameron Sprang, MD  ELIQUIS 2.5 MG TABS tablet TAKE 1 TABLET BY MOUTH TWICE A DAY Patient taking differently: 2.5mg  by mouth twice daily 12/22/17  Yes McAlhany, Annita Brod, MD  glucose blood (ONE TOUCH ULTRA TEST) test strip Use to check blood sugar 1 time per day. Dx Code: E11.9 01/13/17  Yes Renato Shin, MD  metFORMIN (GLUCOPHAGE) 500 MG tablet Take 1 tablet (500 mg total) by mouth 2 (two) times daily with a meal. 12/18/17  Yes Burns, Claudina Lick, MD  metoprolol succinate (TOPROL-XL) 50 MG 24 hr tablet TAKE 1 TABLET BY MOUTH DAILY. TAKE WITH OR IMMEDIATELY FOLLOWING A MEAL. Patient taking differently: 50mg  by mouth once daily 12/05/17  Yes McAlhany, Annita Brod, MD  nitroGLYCERIN (NITROSTAT) 0.4 MG SL tablet Place 0.4 mg under the tongue every 5 (five) minutes as needed for chest pain. Reported on 12/21/2015   Yes [provider]  [facility] DELICA LANCETS 39Q MISC Use 1 lancet to check glucose once daily 01/15/17  Yes Burns, Claudina Lick, MD  OVER THE COUNTER MEDICATION Place 1 patch onto the skin daily as needed (pain). Arthritis pain reliever patch   Yes [provider]  potassium chloride (KLOR-CON 10) 10 MEQ tablet Take 1 tablet (10 mEq total) by mouth daily. 12/24/17  Yes Burnell Blanks, MD  cephALEXin (KEFLEX) 500 MG capsule Take 1 capsule (500 mg total) by mouth 2 (two) times daily. Patient not taking: Reported on 02/03/2018 11/24/17   Binnie Rail, MD  donepezil (ARICEPT) 10 MG tablet Take 1 tablet (10 mg total) by mouth at bedtime. Patient not taking: Reported on 02/03/2018 03/17/17   Cameron Sprang, MD  ondansetron (ZOFRAN ODT) 8 MG disintegrating tablet Take 1 tablet (8 mg total) by mouth every 8 (eight) hours as needed for nausea or vomiting. Patient not taking: Reported on 02/03/2018 02/25/17   Jola Schmidt, MD    Family History Family History  Problem Relation Age of Onset  . Breast cancer Sister        "behind heart"  . Colon cancer Sister   . Colon cancer Sister   . Breast cancer Daughter   . Breast cancer Daughter   . Breast cancer Daughter   . Kidney cancer Daughter     Social History Social History   Tobacco Use  . Smoking status: Never Smoker  . Smokeless tobacco: Current User    Types: Snuff  Substance Use Topics  . Alcohol use: No    Alcohol/week: 0.0 oz  . Drug use: No     Allergies   Clindamycin; Iodides; Iodinated diagnostic agents; Penicillins; Sulfa antibiotics; Sulfonamide derivatives; Albumin (human); Povidone iodine; Celecoxib; Erythromycin; Nitrofuran derivatives; Nitrofurantoin; Piroxicam; and Povidone-iodine  Review of Systems Review of Systems  All other systems reviewed and are negative.    Physical Exam Updated Vital Signs BP (!) 161/66   Pulse 71   Temp (!) 97.5 F (36.4 C) (Oral)   Resp 18   SpO2 100%   Physical Exam  Constitutional: She is oriented to person, place, and time. She appears well-developed and well-nourished.  Non-toxic appearance. She does not appear ill.  HENT:  Head: Normocephalic and atraumatic.  Eyes: Pupils are equal, round, and reactive to light. EOM are normal.  Neck: Normal range of motion.  Cardiovascular: Normal rate, regular rhythm and normal pulses.  Pulmonary/Chest: Effort normal and breath sounds normal.  Abdominal: Soft. Bowel sounds are normal. She exhibits no distension. There is no tenderness. There is no guarding.  Musculoskeletal: Normal range of motion.       Right lower leg: Normal. She exhibits no tenderness and no edema.       Left lower leg: Normal. She exhibits no tenderness and no edema.  Neurological: She is alert and oriented to person, place, and time.  Skin: Skin is warm and dry. Capillary refill takes less than 2 seconds.  Psychiatric: She has a normal mood and affect.  Nursing  note and vitals reviewed.    ED Treatments / Results  Labs (all labs ordered are listed, but only abnormal results are displayed) Labs Reviewed  BASIC METABOLIC PANEL - Abnormal; Notable for the following components:      Result Value   CO2 21 (*)    Glucose, Bld 105 (*)    All other components within normal limits  CBC - Abnormal; Notable for the following components:   RBC 3.53 (*)    Hemoglobin 9.9 (*)    HCT 31.5 (*)    RDW 16.2 (*)    All other components within normal limits  I-STAT TROPONIN, ED  I-STAT TROPONIN, ED  I-STAT TROPONIN, ED    EKG EKG Interpretation  Date/Time:  Tuesday February 03 2018 10:07:15 EDT Ventricular Rate:  77 PR Interval:  212 QRS Duration: 76 QT Interval:  368 QTC Calculation: 416 R Axis:   -7 Text Interpretation:  Sinus rhythm with 1st degree A-V block Moderate voltage criteria for LVH, may be normal variant Anterior infarct , age undetermined Abnormal ECG Confirmed by Pattricia Boss [phone]) on 02/03/2018 12:33:08 PM   Radiology Dg Chest 2 View  Result Date: 02/03/2018 CLINICAL DATA:  Midline chest discomfort for 4-5 days EXAM: CHEST - 2 VIEW COMPARISON:  09/22/2016 FINDINGS: Normal heart size and mediastinal contours. Coronary stent. Chronic mild elevation of the right diaphragm. Linear densities in the lateral projection attributed to mild right base scarring based on 2018 abdominal CT. There is no edema, consolidation, effusion, or pneumothorax. Cholecystectomy clips. IMPRESSION: 1. No evidence of active disease. 2. Mild lung scarring. Electronically Signed   By: Monte Fantasia M.D.   On: 02/03/2018 10:54    Procedures Procedures (including critical care time)  Medications Ordered in ED Medications - No data to display   Initial Impression / Assessment and Plan / ED Course  I have reviewed the triage vital signs and the nursing notes.  Pertinent labs & imaging results that were available during my care of the patient were reviewed  by me and considered in my medical decision making (see chart for details).     1- nonspecific chest pain-no acute ischemic changes - states feels better, cxr clear.  Troponin normal x 3 2- anemia - stable-  will need recheck follow up.   Final Clinical Impressions(s) / ED Diagnoses   Final diagnoses:  Atypical chest pain  Chronic anemia    ED Discharge Orders    None       Pattricia Boss, MD 02/03/18 [phone]

## 2018-02-03 NOTE — Discharge Instructions (Addendum)
Please have your red blood cell count rechecked this week. Your hemoglobin is 9.9 today which is down slightly from 10.3.  Return if your chest pain is worse at any time.

## 2018-02-04 ENCOUNTER — Other Ambulatory Visit: Payer: Self-pay | Admitting: Endocrinology

## 2018-02-05 ENCOUNTER — Other Ambulatory Visit: Payer: Self-pay

## 2018-02-05 ENCOUNTER — Emergency Department (HOSPITAL_COMMUNITY): Payer: Medicare Other

## 2018-02-05 ENCOUNTER — Emergency Department (HOSPITAL_COMMUNITY)
Admission: EM | Admit: 2018-02-05 | Discharge: 2018-02-05 | Disposition: A | Discharge status: Home / Self Care | Payer: Medicare Other | Attending: Emergency Medicine | Admitting: Emergency Medicine

## 2018-02-05 ENCOUNTER — Encounter (HOSPITAL_COMMUNITY): Payer: Self-pay | Admitting: Emergency Medicine

## 2018-02-05 DIAGNOSIS — Z955 Presence of coronary angioplasty implant and graft: Secondary | ICD-10-CM | POA: Diagnosis not present

## 2018-02-05 DIAGNOSIS — I2510 Atherosclerotic heart disease of native coronary artery without angina pectoris: Secondary | ICD-10-CM | POA: Insufficient documentation

## 2018-02-05 DIAGNOSIS — Z79899 Other long term (current) drug therapy: Secondary | ICD-10-CM | POA: Diagnosis not present

## 2018-02-05 DIAGNOSIS — F0391 Unspecified dementia with behavioral disturbance: Secondary | ICD-10-CM | POA: Diagnosis not present

## 2018-02-05 DIAGNOSIS — R079 Chest pain, unspecified: Secondary | ICD-10-CM | POA: Diagnosis not present

## 2018-02-05 DIAGNOSIS — F17290 Nicotine dependence, other tobacco product, uncomplicated: Secondary | ICD-10-CM | POA: Diagnosis not present

## 2018-02-05 DIAGNOSIS — I252 Old myocardial infarction: Secondary | ICD-10-CM | POA: Insufficient documentation

## 2018-02-05 DIAGNOSIS — Z7984 Long term (current) use of oral hypoglycemic drugs: Secondary | ICD-10-CM | POA: Insufficient documentation

## 2018-02-05 DIAGNOSIS — Z8673 Personal history of transient ischemic attack (TIA), and cerebral infarction without residual deficits: Secondary | ICD-10-CM | POA: Diagnosis not present

## 2018-02-05 DIAGNOSIS — R0789 Other chest pain: Secondary | ICD-10-CM | POA: Insufficient documentation

## 2018-02-05 DIAGNOSIS — E119 Type 2 diabetes mellitus without complications: Secondary | ICD-10-CM | POA: Diagnosis not present

## 2018-02-05 DIAGNOSIS — I10 Essential (primary) hypertension: Secondary | ICD-10-CM | POA: Insufficient documentation

## 2018-02-05 LAB — BASIC METABOLIC PANEL
Anion gap: 10 (ref 5–15)
BUN: 21 mg/dL — ABNORMAL HIGH (ref 6–20)
CO2: 21 mmol/L — ABNORMAL LOW (ref 22–32)
Calcium: 9.6 mg/dL (ref 8.9–10.3)
Chloride: 108 mmol/L (ref 101–111)
Creatinine, Ser: 0.81 mg/dL (ref 0.44–1.00)
GFR calc Af Amer: >60 mL/min (ref >60)
GFR calc non Af Amer: >60 mL/min (ref >60)
Glucose, Bld: 103 mg/dL — ABNORMAL HIGH (ref 65–99)
Potassium: 4 mmol/L (ref 3.5–5.1)
Sodium: 139 mmol/L (ref 135–145)

## 2018-02-05 LAB — CBC
HCT: 32.5 % — ABNORMAL LOW (ref 36.0–46.0)
Hemoglobin: 10.3 g/dL — ABNORMAL LOW (ref 12.0–15.0)
MCH: 28 pg (ref 26.0–34.0)
MCHC: 31.7 g/dL (ref 30.0–36.0)
MCV: 88.3 fL (ref 78.0–100.0)
Platelets: 274 10*3/uL (ref 150–400)
RBC: 3.68 MIL/uL — ABNORMAL LOW (ref 3.87–5.11)
RDW: 16 % — ABNORMAL HIGH (ref 11.5–15.5)
WBC: 6.8 10*3/uL (ref 4.0–10.5)

## 2018-02-05 LAB — I-STAT TROPONIN, ED
Troponin i, poc: 0 ng/mL (ref 0.00–0.08)
Troponin i, poc: 0.01 ng/mL (ref 0.00–0.08)

## 2018-02-05 MED ORDER — PANTOPRAZOLE SODIUM 20 MG PO TBEC: 20.0000 mg | DELAYED_RELEASE_TABLET | Freq: Every day | ORAL | 0 refills | Status: DC

## 2018-02-05 MED ORDER — PANTOPRAZOLE SODIUM 40 MG PO TBEC
40.0000 mg | DELAYED_RELEASE_TABLET | Freq: Once | ORAL | Status: AC
  Administered 2018-02-05: 40 mg via ORAL
  Filled 2018-02-05: qty 1

## 2018-02-05 NOTE — Telephone Encounter (Signed)
No longer a patient of mine.

## 2018-02-05 NOTE — Telephone Encounter (Signed)
Last ov 08/12/16 1 no show and no future appointment scheduled refill or refuse please advise

## 2018-02-05 NOTE — ED Triage Notes (Signed)
Patient complains of "my heart feels funny, it hurts". Patient states she is unable to describe the pain. Denies shortness of breath, dizziness, and nausea. Alert and oriented and in no apparent distress at this time.

## 2018-02-05 NOTE — ED Provider Notes (Signed)
[facility] EMERGENCY DEPARTMENT Provider Note   CSN: [id number] Arrival date & time: 02/05/18  1013     History   Chief Complaint Chief Complaint  Patient presents with  . Chest Pain    HPI [name] is a 84 y.o. female.  HPI Patient reports that she has had the chest pain for which she is presenting today for may be "years".  She states that its her "heart".  Pain typically occurs at rest and seated position.  Often it is if she is watching television or doing a sedentary activity.  Patient reports that she can go to the grocery store and walk around without developing chest pain or exertional dyspnea.  She may go days without having the pain.  If she develops it, may last for hours.  She denies any associated shortness of breath.  No cough no nausea no vomiting.  No recent fever.  She does not specifically associate the pain with eating.  She does not have any known history of reflux and does not take reflux medications. Past Medical History:  Diagnosis Date  . Abdominal pain   . Allergic rhinitis, cause unspecified   . Anemia B twelve deficiency   . Arrhythmia    paroxysmal atrial fibrillation  . Arthritis    "arms and hands" (06/30/2013)  . Benign neoplasm of colon   . Colon polyp   . Coronary artery disease   . Degeneration of lumbar or lumbosacral intervertebral disc   . Dizziness - giddy   . Edema   . GERD (gastroesophageal reflux disease)   . Hyperlipidemia   . Hypertension   . Iron deficiency anemia, unspecified   . Lumbago   . Pernicious anemia   . Seizures ([place])   . Stroke ([place])   . Symptomatic menopausal or female climacteric states   . Thoracic or lumbosacral neuritis or radiculitis, unspecified   . Type II diabetes mellitus [facility])     Patient Active Problem List   Diagnosis Date Noted  . Acute cystitis without hematuria 10/22/2017  . Left ankle pain 10/03/2017  . Moderate dementia with behavioral disturbance 03/21/2017  .  Cerebral infarction ([place]) 12/27/2015  . Convulsions ([place]) 09/11/2015  . SVT (supraventricular tachycardia) ([place])   . Fall 08/21/2015  . PAF (paroxysmal atrial fibrillation) ([place])   . Stroke (cerebrum) ([place]) 08/20/2015  . Aphasia   . TIA (transient ischemic attack)   . FTT (failure to thrive) in adult   . Syncope 10/31/2014  . Diabetes mellitus type 2, controlled ([place]) 10/31/2014  . Uncontrolled hypertension 10/31/2014  . Incomplete emptying of bladder 05/17/2014  . Headache 11/26/2013  . Urinary tract infection without hematuria 08/20/2011  . CAD, NATIVE VESSEL 02/14/2010  . GERD 02/14/2010  . ANEMIA-B12 DEFICIENCY 01/01/2010  . OSTEOARTHRITIS 01/01/2010  . PERSONAL HX COLONIC POLYPS 01/01/2010  . Iron deficiency anemia 12/28/2009  . DIZZINESS AND GIDDINESS 12/28/2009  . Lower abdominal pain 09/13/2009  . Chronic lower back pain 08/11/2008  . KNEE PAIN, RIGHT 02/11/2008  . Dyslipidemia 04/23/2007  . Allergic rhinitis 04/23/2007  . GALLBLADDER DISEASE 04/23/2007  . DIVERTICULITIS, HX OF 04/23/2007    Past Surgical History:  Procedure Laterality Date  . ABDOMINAL HYSTERECTOMY  1974  . APPENDECTOMY  1974  . CARDIAC CATHETERIZATION    . CARPAL TUNNEL RELEASE Bilateral 1970's  . CATARACT EXTRACTION, BILATERAL Bilateral   . CHOLECYSTECTOMY    . CORONARY ANGIOPLASTY WITH STENT PLACEMENT     "1" (06/30/2013  . LUMBAR  DISC SURGERY       OB History   None      Home Medications    Prior to Admission medications   Medication Sig Start Date End Date Taking? Authorizing Provider  acetaminophen (TYLENOL) 325 MG tablet Take 650 mg by mouth every 6 (six) hours as needed for mild pain.   Yes [provider]  amLODipine (NORVASC) 5 MG tablet Take 1 tablet (5 mg total) daily by mouth. Please keep upcoming appt in February for future refills. Thanks 09/30/17  Yes Burnell Blanks, MD  atorvastatin (LIPITOR) 40 MG tablet Take 1 tablet (40 mg total) daily by mouth.  Please keep upcoming appt in February for future refills. Thanks Patient taking differently: Take 40 mg by mouth every evening. Please keep upcoming appt in February for future refills. Thanks 09/30/17  Yes Burnell Blanks, MD  divalproex (DEPAKOTE ER) 250 MG 24 hr tablet Take 1 tablet (250 mg total) by mouth daily. 03/17/17  Yes Cameron Sprang, MD  ELIQUIS 2.5 MG TABS tablet TAKE 1 TABLET BY MOUTH TWICE A DAY Patient taking differently: 2.5mg  by mouth twice daily 12/22/17  Yes McAlhany, Annita Brod, MD  glucose blood (ONE TOUCH ULTRA TEST) test strip Use to check blood sugar 1 time per day. Dx Code: E11.9 01/13/17  Yes Renato Shin, MD  metFORMIN (GLUCOPHAGE) 500 MG tablet Take 1 tablet (500 mg total) by mouth 2 (two) times daily with a meal. 12/18/17  Yes Burns, Claudina Lick, MD  metoprolol succinate (TOPROL-XL) 50 MG 24 hr tablet TAKE 1 TABLET BY MOUTH DAILY. TAKE WITH OR IMMEDIATELY FOLLOWING A MEAL. Patient taking differently: 50mg  by mouth once daily 12/05/17  Yes McAlhany, Annita Brod, MD  [facility] DELICA LANCETS 27C MISC Use 1 lancet to check glucose once daily 01/15/17  Yes Burns, Claudina Lick, MD  OVER THE COUNTER MEDICATION Place 1 patch onto the skin daily as needed (pain). Arthritis pain reliever patch   Yes [provider]  potassium chloride (KLOR-CON 10) 10 MEQ tablet Take 1 tablet (10 mEq total) by mouth daily. 12/24/17  Yes Burnell Blanks, MD  donepezil (ARICEPT) 10 MG tablet Take 1 tablet (10 mg total) by mouth at bedtime. Patient not taking: Reported on 02/03/2018 03/17/17   Cameron Sprang, MD  nitroGLYCERIN (NITROSTAT) 0.4 MG SL tablet Place 0.4 mg under the tongue every 5 (five) minutes as needed for chest pain. Reported on 12/21/2015    [provider]  ondansetron (ZOFRAN ODT) 8 MG disintegrating tablet Take 1 tablet (8 mg total) by mouth every 8 (eight) hours as needed for nausea or vomiting. Patient not taking: Reported on 02/03/2018 02/25/17   Jola Schmidt,  MD  pantoprazole (PROTONIX) 20 MG tablet Take 1 tablet (20 mg total) by mouth daily. 02/05/18   Charlesetta Shanks, MD    Family History Family History  Problem Relation Age of Onset  . Breast cancer Sister        "behind heart"  . Colon cancer Sister   . Colon cancer Sister   . Breast cancer Daughter   . Breast cancer Daughter   . Breast cancer Daughter   . Kidney cancer Daughter     Social History Social History   Tobacco Use  . Smoking status: Never Smoker  . Smokeless tobacco: Current User    Types: Snuff  Substance Use Topics  . Alcohol use: No    Alcohol/week: 0.0 oz  . Drug use: No  Allergies   Clindamycin; Iodides; Iodinated diagnostic agents; Penicillins; Sulfa antibiotics; Sulfonamide derivatives; Albumin (human); Povidone iodine; Celecoxib; Erythromycin; Nitrofuran derivatives; Nitrofurantoin; Piroxicam; and Povidone-iodine   Review of Systems Review of Systems  10 Systems reviewed and are negative for acute change except as noted in the HPI. Physical Exam Updated Vital Signs BP (!) 164/73 (BP Location: Right Arm)   Pulse 68   Temp 98.2 F (36.8 C) (Oral)   Resp 14   SpO2 99%   Physical Exam  Constitutional: She is oriented to person, place, and time. She appears well-developed and well-nourished. No distress.  HENT:  Head: Normocephalic and atraumatic.  Mouth/Throat: Oropharynx is clear and moist.  Eyes: Conjunctivae and EOM are normal.  Neck: Neck supple.  Cardiovascular: Normal rate, regular rhythm, normal heart sounds and intact distal pulses.  No murmur heard. Pulmonary/Chest: Effort normal and breath sounds normal. No respiratory distress. She exhibits no tenderness.  Abdominal: Soft. She exhibits no distension. There is no tenderness. There is no guarding.  Musculoskeletal: Normal range of motion. She exhibits no edema or tenderness.  Neurological: She is alert and oriented to person, place, and time. No cranial nerve deficit. She exhibits  normal muscle tone. Coordination normal.  Skin: Skin is warm and dry.  Psychiatric: She has a normal mood and affect.  Nursing note and vitals reviewed.    ED Treatments / Results  Labs (all labs ordered are listed, but only abnormal results are displayed) Labs Reviewed  BASIC METABOLIC PANEL - Abnormal; Notable for the following components:      Result Value   CO2 21 (*)    Glucose, Bld 103 (*)    BUN 21 (*)    All other components within normal limits  CBC - Abnormal; Notable for the following components:   RBC 3.68 (*)    Hemoglobin 10.3 (*)    HCT 32.5 (*)    RDW 16.0 (*)    All other components within normal limits  I-STAT TROPONIN, ED  I-STAT TROPONIN, ED    EKG None EKG will not import from [place].  Sinus rhythm 73 PR 210 QRS 70 No ST-T wave changes.  No change from previous.  Radiology Dg Chest 2 View  Result Date: 02/05/2018 CLINICAL DATA:  One day history of chest pain. No shortness of breath. History of previous cholecystectomy, coronary angioplasty with stent placement, nonsmoker. EXAM: CHEST - 2 VIEW COMPARISON:  Chest x-ray of February 03, 2018 FINDINGS: There is elevation of the right hemidiaphragm which is chronic. The lungs are clear. The heart and pulmonary vascularity are normal. There is calcification in the wall of the thoracic aorta. There is no pleural effusion. The bony thorax exhibits no acute abnormality. There is calcification of the anterior longitudinal ligament of the thoracic spine. IMPRESSION: No acute cardiopulmonary abnormality. Chronic elevation of the right hemidiaphragm. Thoracic aortic atherosclerosis. Electronically Signed   By: David  Martinique M.D.   On: 02/05/2018 11:10    Procedures Procedures (including critical care time)  Medications Ordered in ED Medications  pantoprazole (PROTONIX) EC tablet 40 mg (40 mg Oral Given 02/05/18 1556)     Initial Impression / Assessment and Plan / ED Course  I have reviewed the triage vital signs and  the nursing notes.  Pertinent labs & imaging results that were available during my care of the patient were reviewed by me and considered in my medical decision making (see chart for details).      Final Clinical Impressions(s) / ED Diagnoses  Final diagnoses:  Atypical chest pain  Patient is clinically quite well in appearance.  Patient is active at her baseline without any limitations of chest pain or dyspnea on exertion.  Patient had cardiac rule out in the emergency department 2 days earlier.  EKG and enzymes remain unchanged.  Patient's vital signs are very good.  Except for patient's age, I have low suspicion for cardiac etiology.  At this time, plan will be to initiate a PPI and follow-up with cardiology as outpatient.  ED Discharge Orders        Ordered    pantoprazole (PROTONIX) 20 MG tablet  Daily     02/05/18 1557       Charlesetta Shanks, MD 02/06/18 [phone]

## 2018-02-08 ENCOUNTER — Other Ambulatory Visit: Payer: Self-pay

## 2018-02-08 ENCOUNTER — Encounter (HOSPITAL_BASED_OUTPATIENT_CLINIC_OR_DEPARTMENT_OTHER): Payer: Self-pay | Admitting: *Deleted

## 2018-02-08 ENCOUNTER — Emergency Department (HOSPITAL_BASED_OUTPATIENT_CLINIC_OR_DEPARTMENT_OTHER)
Admission: EM | Admit: 2018-02-08 | Discharge: 2018-02-08 | Disposition: A | Discharge status: Home / Self Care | Payer: Medicare Other | Attending: Emergency Medicine | Admitting: Emergency Medicine

## 2018-02-08 ENCOUNTER — Emergency Department (HOSPITAL_BASED_OUTPATIENT_CLINIC_OR_DEPARTMENT_OTHER): Payer: Medicare Other

## 2018-02-08 DIAGNOSIS — Y92019 Unspecified place in single-family (private) house as the place of occurrence of the external cause: Secondary | ICD-10-CM | POA: Diagnosis not present

## 2018-02-08 DIAGNOSIS — E119 Type 2 diabetes mellitus without complications: Secondary | ICD-10-CM | POA: Diagnosis not present

## 2018-02-08 DIAGNOSIS — F17228 Nicotine dependence, chewing tobacco, with other nicotine-induced disorders: Secondary | ICD-10-CM | POA: Insufficient documentation

## 2018-02-08 DIAGNOSIS — Z8673 Personal history of transient ischemic attack (TIA), and cerebral infarction without residual deficits: Secondary | ICD-10-CM | POA: Diagnosis not present

## 2018-02-08 DIAGNOSIS — Z23 Encounter for immunization: Secondary | ICD-10-CM | POA: Diagnosis not present

## 2018-02-08 DIAGNOSIS — S8011XA Contusion of right lower leg, initial encounter: Secondary | ICD-10-CM | POA: Insufficient documentation

## 2018-02-08 DIAGNOSIS — M7989 Other specified soft tissue disorders: Secondary | ICD-10-CM | POA: Diagnosis not present

## 2018-02-08 DIAGNOSIS — I10 Essential (primary) hypertension: Secondary | ICD-10-CM | POA: Diagnosis not present

## 2018-02-08 DIAGNOSIS — W010XXA Fall on same level from slipping, tripping and stumbling without subsequent striking against object, initial encounter: Secondary | ICD-10-CM | POA: Diagnosis not present

## 2018-02-08 DIAGNOSIS — Y998 Other external cause status: Secondary | ICD-10-CM | POA: Diagnosis not present

## 2018-02-08 DIAGNOSIS — Z7901 Long term (current) use of anticoagulants: Secondary | ICD-10-CM | POA: Diagnosis not present

## 2018-02-08 DIAGNOSIS — Z7984 Long term (current) use of oral hypoglycemic drugs: Secondary | ICD-10-CM | POA: Diagnosis not present

## 2018-02-08 DIAGNOSIS — Z79899 Other long term (current) drug therapy: Secondary | ICD-10-CM | POA: Insufficient documentation

## 2018-02-08 DIAGNOSIS — I2510 Atherosclerotic heart disease of native coronary artery without angina pectoris: Secondary | ICD-10-CM | POA: Diagnosis not present

## 2018-02-08 DIAGNOSIS — S81811A Laceration without foreign body, right lower leg, initial encounter: Secondary | ICD-10-CM | POA: Insufficient documentation

## 2018-02-08 DIAGNOSIS — R011 Cardiac murmur, unspecified: Secondary | ICD-10-CM | POA: Diagnosis not present

## 2018-02-08 DIAGNOSIS — Y9301 Activity, walking, marching and hiking: Secondary | ICD-10-CM | POA: Diagnosis not present

## 2018-02-08 DIAGNOSIS — S8991XA Unspecified injury of right lower leg, initial encounter: Secondary | ICD-10-CM | POA: Diagnosis present

## 2018-02-08 MED ORDER — TETANUS-DIPHTH-ACELL PERTUSSIS 5-2.5-18.5 LF-MCG/0.5 IM SUSP
0.5000 mL | Freq: Once | INTRAMUSCULAR | Status: AC
  Administered 2018-02-08: 0.5 mL via INTRAMUSCULAR
  Filled 2018-02-08: qty 0.5

## 2018-02-08 NOTE — ED Notes (Signed)
Wound care completed with non-adherent dressing , kerlix and tape.

## 2018-02-08 NOTE — ED Provider Notes (Signed)
[facility] EMERGENCY DEPARTMENT Provider Note   CSN: [id number] Arrival date & time: 02/08/18  1529     History   Chief Complaint Chief Complaint  Patient presents with  . 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  84 year old female with past medical history below including CVA, hypertension, type 2 diabetes mellitus, atrial fibrillation on Eliquis who presents with fall and right leg injury.  Just prior to arrival, the patient was at her house and tripped and fell, hitting her right shin.  She did not hit her head or lose consciousness.  She has been ambulatory since the event.  She sustained a wound to her lower leg and has had some swelling but denies any severe pain.  She denies any other areas of injury including no chest, abdominal, neck, head, or back pain.  Unknown last tetanus vaccination.  The history is provided by the patient.  Fall     Past Medical History:  Diagnosis Date  . Abdominal pain   . Allergic rhinitis, cause unspecified   . Anemia B twelve deficiency   . Arrhythmia    paroxysmal atrial fibrillation  . Arthritis    "arms and hands" (06/30/2013)  . Benign neoplasm of colon   . Colon polyp   . Coronary artery disease   . Degeneration of lumbar or lumbosacral intervertebral disc   . Dizziness - giddy   . Edema   . GERD (gastroesophageal reflux disease)   . Hyperlipidemia   . Hypertension   . Iron deficiency anemia, unspecified   . Lumbago   . Pernicious anemia   . Seizures ([place])   . Stroke ([place])   . Symptomatic menopausal or female climacteric states   . Thoracic or lumbosacral neuritis or radiculitis, unspecified   . Type II diabetes mellitus [facility])     Patient Active Problem List   Diagnosis Date Noted  . Acute cystitis without hematuria 10/22/2017  . Left ankle pain 10/03/2017  . Moderate dementia with behavioral disturbance 03/21/2017  . Cerebral infarction ([place]) 12/27/2015  . Convulsions ([place]) 09/11/2015  . SVT  (supraventricular tachycardia) ([place])   . Fall 08/21/2015  . PAF (paroxysmal atrial fibrillation) ([place])   . Stroke (cerebrum) ([place]) 08/20/2015  . Aphasia   . TIA (transient ischemic attack)   . FTT (failure to thrive) in adult   . Syncope 10/31/2014  . Diabetes mellitus type 2, controlled ([place]) 10/31/2014  . Uncontrolled hypertension 10/31/2014  . Incomplete emptying of bladder 05/17/2014  . Headache 11/26/2013  . Urinary tract infection without hematuria 08/20/2011  . CAD, NATIVE VESSEL 02/14/2010  . GERD 02/14/2010  . ANEMIA-B12 DEFICIENCY 01/01/2010  . OSTEOARTHRITIS 01/01/2010  . PERSONAL HX COLONIC POLYPS 01/01/2010  . Iron deficiency anemia 12/28/2009  . DIZZINESS AND GIDDINESS 12/28/2009  . Lower abdominal pain 09/13/2009  . Chronic lower back pain 08/11/2008  . KNEE PAIN, RIGHT 02/11/2008  . Dyslipidemia 04/23/2007  . Allergic rhinitis 04/23/2007  . GALLBLADDER DISEASE 04/23/2007  . DIVERTICULITIS, HX OF 04/23/2007    Past Surgical History:  Procedure Laterality Date  . ABDOMINAL HYSTERECTOMY  1974  . APPENDECTOMY  1974  . CARDIAC CATHETERIZATION    . CARPAL TUNNEL RELEASE Bilateral 1970's  . CATARACT EXTRACTION, BILATERAL Bilateral   . CHOLECYSTECTOMY    . CORONARY ANGIOPLASTY WITH STENT PLACEMENT     "1" (06/30/2013  . LUMBAR DISC SURGERY       OB History   None  Home Medications    Prior to Admission medications   Medication Sig Start Date End Date Taking? Authorizing Provider  acetaminophen (TYLENOL) 325 MG tablet Take 650 mg by mouth every 6 (six) hours as needed for mild pain.    [provider]  amLODipine (NORVASC) 5 MG tablet Take 1 tablet (5 mg total) daily by mouth. Please keep upcoming appt in February for future refills. Thanks 09/30/17   Burnell Blanks, MD  atorvastatin (LIPITOR) 40 MG tablet Take 1 tablet (40 mg total) daily by mouth. Please keep upcoming appt in February for future refills. Thanks Patient taking  differently: Take 40 mg by mouth every evening. Please keep upcoming appt in February for future refills. Thanks 09/30/17   Burnell Blanks, MD  divalproex (DEPAKOTE ER) 250 MG 24 hr tablet Take 1 tablet (250 mg total) by mouth daily. 03/17/17   Cameron Sprang, MD  donepezil (ARICEPT) 10 MG tablet Take 1 tablet (10 mg total) by mouth at bedtime. Patient not taking: Reported on 02/03/2018 03/17/17   Cameron Sprang, MD  ELIQUIS 2.5 MG TABS tablet TAKE 1 TABLET BY MOUTH TWICE A DAY Patient taking differently: 2.5mg  by mouth twice daily 12/22/17   Burnell Blanks, MD  glucose blood (ONE TOUCH ULTRA TEST) test strip Use to check blood sugar 1 time per day. Dx Code: E11.9 01/13/17   Renato Shin, MD  metFORMIN (GLUCOPHAGE) 500 MG tablet Take 1 tablet (500 mg total) by mouth 2 (two) times daily with a meal. 12/18/17   Burns, Claudina Lick, MD  metoprolol succinate (TOPROL-XL) 50 MG 24 hr tablet TAKE 1 TABLET BY MOUTH DAILY. TAKE WITH OR IMMEDIATELY FOLLOWING A MEAL. Patient taking differently: 50mg  by mouth once daily 12/05/17   Burnell Blanks, MD  nitroGLYCERIN (NITROSTAT) 0.4 MG SL tablet Place 0.4 mg under the tongue every 5 (five) minutes as needed for chest pain. Reported on 12/21/2015    [provider]  ondansetron (ZOFRAN ODT) 8 MG disintegrating tablet Take 1 tablet (8 mg total) by mouth every 8 (eight) hours as needed for nausea or vomiting. Patient not taking: Reported on 02/03/2018 02/25/17   Jola Schmidt, MD  [facility] DELICA LANCETS 44H MISC Use 1 lancet to check glucose once daily 01/15/17   Binnie Rail, MD  OVER THE COUNTER MEDICATION Place 1 patch onto the skin daily as needed (pain). Arthritis pain reliever patch    [provider]  pantoprazole (PROTONIX) 20 MG tablet Take 1 tablet (20 mg total) by mouth daily. 02/05/18   Charlesetta Shanks, MD  potassium chloride (KLOR-CON 10) 10 MEQ tablet Take 1 tablet (10 mEq total) by mouth daily. 12/24/17   Burnell Blanks, MD    Family History Family History  Problem Relation Age of Onset  . Breast cancer Sister        "behind heart"  . Colon cancer Sister   . Colon cancer Sister   . Breast cancer Daughter   . Breast cancer Daughter   . Breast cancer Daughter   . Kidney cancer Daughter     Social History Social History   Tobacco Use  . Smoking status: Never Smoker  . Smokeless tobacco: Current User    Types: Snuff  Substance Use Topics  . Alcohol use: No    Alcohol/week: 0.0 oz  . Drug use: No     Allergies   Clindamycin; Iodides; Iodinated diagnostic agents; Penicillins; Sulfa antibiotics; Sulfonamide derivatives; Albumin (human); Povidone iodine; Celecoxib;  Erythromycin; Nitrofuran derivatives; Nitrofurantoin; Piroxicam; and Povidone-iodine   Review of Systems Review of Systems All other systems reviewed and are negative except that which was mentioned in HPI   Physical Exam Updated Vital Signs BP (!) 146/74 (BP Location: Right Arm)   Pulse 85   Temp 98.7 F (37.1 C) (Oral)   Resp 18   Ht 5\' 1"  (1.549 m)   Wt 49.9 kg (110 lb)   SpO2 95%   BMI 20.78 kg/m   Physical Exam  Constitutional: She is oriented to person, place, and time. She appears well-developed and well-nourished. No distress.  HENT:  Head: Normocephalic and atraumatic.  Mouth/Throat: Oropharynx is clear and moist.  Moist mucous membranes  Eyes: Pupils are equal, round, and reactive to light. Conjunctivae are normal.  Neck: Neck supple.  Cardiovascular: Normal rate, regular rhythm and intact distal pulses.  Murmur heard. Pulmonary/Chest: Effort normal and breath sounds normal. She exhibits no tenderness.  Abdominal: Soft. Bowel sounds are normal. She exhibits no distension. There is no tenderness.  Musculoskeletal: She exhibits no deformity.  R lower leg with mid-shin skin tear and hematoma just distal to skin tear; no knee or ankle tenderness  Neurological: She is alert and oriented to person, place,  and time. No sensory deficit.  Fluent speech  Skin: Skin is warm and dry.  Psychiatric: She has a normal mood and affect. Judgment normal.  Nursing note and vitals reviewed.    ED Treatments / Results  Labs (all labs ordered are listed, but only abnormal results are displayed) Labs Reviewed - No data to display  EKG None  Radiology Dg Tibia/fibula Right  Result Date: 02/08/2018 CLINICAL DATA:  Initial encounter for C/o tripped over her cane today and fell. Has skin tear and small hematoma on mid shaft of anterior Rt lower leg EXAM: RIGHT TIBIA AND FIBULA - 2 VIEW COMPARISON:  02/11/2008 knee films. FINDINGS: Degenerate changes about the ankle. Mild soft tissue swelling about the mid to distal tibial shaft anteriorly. Vascular calcifications. Small Achilles and calcaneal spurs. Enthesopathic change about the quadriceps. Possible small knee joint effusion. IMPRESSION: Pretibial soft tissue swelling, without acute osseous abnormality. Electronically Signed   By: Abigail Miyamoto M.D.   On: 02/08/2018 17:12    Procedures Procedures (including critical care time)  Medications Ordered in ED Medications  Tdap (BOOSTRIX) injection 0.5 mL (has no administration in time range)     Initial Impression / Assessment and Plan / ED Course  I have reviewed the triage vital signs and the nursing notes.  Pertinent imaging results that were available during my care of the patient were reviewed by me and considered in my medical decision making (see chart for details).    Well appearing on exam, no evidence of head injury and denies any head injury. No other complaints aside from R leg. XR negative for bony injury. Applied bacitracin to wound, discussed wound care. Updated tetanus.  Viewed return precautions with the patient and her family given her Eliquis use.  They voiced understanding.  Final Clinical Impressions(s) / ED Diagnoses   Final diagnoses:  Skin tear of lower leg without complication,  right, initial encounter  Traumatic hematoma of right lower leg, initial encounter    ED Discharge Orders    None       Yuleimy Kretz, Wenda Overland, MD 02/08/18 1749

## 2018-02-08 NOTE — ED Triage Notes (Signed)
Pt fell today. Reports that she tripped over her cane. Denies hitting head or LOC. Noted to have a skin tear on right shin and a hematoma below the skin tear. Bleeding controlled.

## 2018-02-10 NOTE — Progress Notes (Signed)
Subjective:    Patient ID: [name], female    DOB: May 08, 1925, 84 y.o.   MRN: [id number]  HPI The patient is here for follow up from the hospital.  ED 02/03/18, 02/05/18 for atypical chest pain:  She went to the ED 02/03/18 for not feeling well.  Her chest pain did not feel right - she pointed to the upper abdomen and lower chest.  She had some nausea, but no vomiting.  She denied cough, fever/chill.  Troponin x 3 was negative.  cxr was normal.  EKG showed no acute changes.  Blood work showed stable anemia, other wise normal.  She felt better and was discharged home.   3.28/19; she went to ED complaining of heart pain that occurs at rest and seated position.  She has had the pain for years. She denies chest pain and DOE with walking.  The pain is not daily.  No pain with eating.  EKG showed no acute changes.  Troponin x 2 negative.  She was prescribed protonix.  She has not had any reflux.     ED 02/08/18 for skin tear of right lower leg:  She fell and suffered a right leg injury.  She tripped at her home and fell.  She hit her right shin.  There was no head trauma or LOC.  She has been ambulatory since the fall.  She had a wound to her lower leg and had some swelling.  She denied severe pain.  There were no other injuries.  She had a td.  Bacitracin applied to wound.    Tib/fib xray 02/08/18:  IMPRESSION: Pretibial soft tissue swelling, without acute osseous abnormality.  Her daughter has been doing wound care and it has not changed.  She does have pain in the leg.  She denies discharge from the wound.  She denies fever or chills.    She has not had any chest pain, palpitations, sob and GERD.     Medications and allergies reviewed with patient and updated if appropriate.  Patient Active Problem List   Diagnosis Date Noted  . Acute cystitis without hematuria 10/22/2017  . Left ankle pain 10/03/2017  . Moderate dementia with behavioral disturbance 03/21/2017  . Cerebral  infarction ([place]) 12/27/2015  . Convulsions ([place]) 09/11/2015  . SVT (supraventricular tachycardia) ([place])   . Fall 08/21/2015  . PAF (paroxysmal atrial fibrillation) ([place])   . Stroke (cerebrum) ([place]) 08/20/2015  . Aphasia   . TIA (transient ischemic attack)   . FTT (failure to thrive) in adult   . Syncope 10/31/2014  . Diabetes mellitus type 2, controlled ([place]) 10/31/2014  . Uncontrolled hypertension 10/31/2014  . Incomplete emptying of bladder 05/17/2014  . Headache 11/26/2013  . Urinary tract infection without hematuria 08/20/2011  . CAD, NATIVE VESSEL 02/14/2010  . GERD 02/14/2010  . ANEMIA-B12 DEFICIENCY 01/01/2010  . OSTEOARTHRITIS 01/01/2010  . PERSONAL HX COLONIC POLYPS 01/01/2010  . Iron deficiency anemia 12/28/2009  . DIZZINESS AND GIDDINESS 12/28/2009  . Lower abdominal pain 09/13/2009  . Chronic lower back pain 08/11/2008  . KNEE PAIN, RIGHT 02/11/2008  . Dyslipidemia 04/23/2007  . Allergic rhinitis 04/23/2007  . GALLBLADDER DISEASE 04/23/2007  . DIVERTICULITIS, HX OF 04/23/2007    Current Outpatient Medications on File Prior to Visit  Medication Sig Dispense Refill  . acetaminophen (TYLENOL) 325 MG tablet Take 650 mg by mouth every 6 (six) hours as needed for mild pain.    [name] amLODipine (NORVASC) 5 MG tablet Take 1 tablet (  5 mg total) daily by mouth. Please keep upcoming appt in February for future refills. Thanks 90 tablet 0  . atorvastatin (LIPITOR) 40 MG tablet Take 1 tablet (40 mg total) daily by mouth. Please keep upcoming appt in February for future refills. Thanks (Patient taking differently: Take 40 mg by mouth every evening. Please keep upcoming appt in February for future refills. Thanks) 90 tablet 0  . divalproex (DEPAKOTE ER) 250 MG 24 hr tablet Take 1 tablet (250 mg total) by mouth daily. 30 tablet 6  . donepezil (ARICEPT) 10 MG tablet Take 1 tablet (10 mg total) by mouth at bedtime. 90 tablet 3  . ELIQUIS 2.5 MG TABS tablet TAKE 1 TABLET BY MOUTH TWICE A  DAY (Patient taking differently: 2.5mg  by mouth twice daily) 180 tablet 2  . glucose blood (ONE TOUCH ULTRA TEST) test strip Use to check blood sugar 1 time per day. Dx Code: E11.9 100 each 12  . metFORMIN (GLUCOPHAGE) 500 MG tablet Take 1 tablet (500 mg total) by mouth 2 (two) times daily with a meal. 180 tablet 1  . metoprolol succinate (TOPROL-XL) 50 MG 24 hr tablet TAKE 1 TABLET BY MOUTH DAILY. TAKE WITH OR IMMEDIATELY FOLLOWING A MEAL. (Patient taking differently: 50mg  by mouth once daily) 90 tablet 0  . nitroGLYCERIN (NITROSTAT) 0.4 MG SL tablet Place 0.4 mg under the tongue every 5 (five) minutes as needed for chest pain. Reported on 12/21/2015    . ondansetron (ZOFRAN ODT) 8 MG disintegrating tablet Take 1 tablet (8 mg total) by mouth every 8 (eight) hours as needed for nausea or vomiting. 10 tablet 0  . ONETOUCH DELICA LANCETS 80D MISC Use 1 lancet to check glucose once daily 100 each 1  . OVER THE COUNTER MEDICATION Place 1 patch onto the skin daily as needed (pain). Arthritis pain reliever patch    . pantoprazole (PROTONIX) 20 MG tablet Take 1 tablet (20 mg total) by mouth daily. 30 tablet 0  . potassium chloride (KLOR-CON 10) 10 MEQ tablet Take 1 tablet (10 mEq total) by mouth daily. 90 tablet 3   No current facility-administered medications on file prior to visit.     Past Medical History:  Diagnosis Date  . Abdominal pain   . Allergic rhinitis, cause unspecified   . Anemia B twelve deficiency   . Arrhythmia    paroxysmal atrial fibrillation  . Arthritis    "arms and hands" (06/30/2013)  . Benign neoplasm of colon   . Colon polyp   . Coronary artery disease   . Degeneration of lumbar or lumbosacral intervertebral disc   . Dizziness - giddy   . Edema   . GERD (gastroesophageal reflux disease)   . Hyperlipidemia   . Hypertension   . Iron deficiency anemia, unspecified   . Lumbago   . Pernicious anemia   . Seizures ([place])   . Stroke ([place])   . Symptomatic menopausal or  female climacteric states   . Thoracic or lumbosacral neuritis or radiculitis, unspecified   . Type II diabetes mellitus ([place])     Past Surgical History:  Procedure Laterality Date  . ABDOMINAL HYSTERECTOMY  1974  . APPENDECTOMY  1974  . CARDIAC CATHETERIZATION    . CARPAL TUNNEL RELEASE Bilateral 1970's  . CATARACT EXTRACTION, BILATERAL Bilateral   . CHOLECYSTECTOMY    . CORONARY ANGIOPLASTY WITH STENT PLACEMENT     "1" (06/30/2013  . LUMBAR DISC SURGERY      Social History   Socioeconomic History  .  Marital status: Widowed    Spouse name: Not on file  . Number of children: 8  . Years of education: 10th Grade  . Highest education level: Not on file  Occupational History  . Not on file  Social Needs  . Financial resource strain: Not on file  . Food insecurity:    Worry: Not on file    Inability: Not on file  . Transportation needs:    Medical: Not on file    Non-medical: Not on file  Tobacco Use  . Smoking status: Never Smoker  . Smokeless tobacco: Current User    Types: Snuff  Substance and Sexual Activity  . Alcohol use: No    Alcohol/week: 0.0 oz  . Drug use: No  . Sexual activity: Never  Lifestyle  . Physical activity:    Days per week: Not on file    Minutes per session: Not on file  . Stress: Not on file  Relationships  . Social connections:    Talks on phone: Not on file    Gets together: Not on file    Attends religious service: Not on file    Active member of club or organization: Not on file    Attends meetings of clubs or organizations: Not on file    Relationship status: Not on file  Other Topics Concern  . Not on file  Social History Narrative   Patient lives with daughter in a two story home.   Patient has a 10th grade education.   Patient is retired.   Patient is right handed.    Family History  Problem Relation Age of Onset  . Breast cancer Sister        "behind heart"  . Colon cancer Sister   . Colon cancer Sister   . Breast  cancer Daughter   . Breast cancer Daughter   . Breast cancer Daughter   . Kidney cancer Daughter     Review of Systems  Constitutional: Negative for chills and fever.  Respiratory: Negative for cough, shortness of breath and wheezing.   Cardiovascular: Negative for chest pain, palpitations and leg swelling.  Gastrointestinal: Positive for abdominal pain (chronic).  Endocrine: Positive for cold intolerance.  Neurological: Negative for light-headedness and headaches.       Objective:   Vitals:   02/11/18 1022  BP: 134/76  Pulse: 74  Resp: 16  Temp: 98.6 F (37 C)  SpO2: 98%   BP Readings from Last 3 Encounters:  02/11/18 134/76  02/08/18 (!) 155/68  02/05/18 (!) 164/73   Wt Readings from Last 3 Encounters:  02/11/18 112 lb (50.8 kg)  02/08/18 110 lb (49.9 kg)  12/15/17 110 lb 6.4 oz (50.1 kg)   Body mass index is 21.16 kg/m.   Physical Exam    Constitutional: Appears well-developed and well-nourished. No distress.  HENT:  Head: Normocephalic and atraumatic.  Neck: Neck supple. No tracheal deviation present. No thyromegaly present.  No cervical lymphadenopathy Cardiovascular: Normal rate, regular rhythm and normal heart sounds.   No murmur heard. No carotid bruit .  No edema Pulmonary/Chest: Effort normal and breath sounds normal. No respiratory distress. No has no wheezes. No rales.  Abdomen: soft, non distended, minimal tenderness in LLQ w/o rebound or guarding Skin: Skin is warm and dry. Not diaphoretic. three superficial skin tears right anterior shin without discharge, bruising surrounding; hematoma lower right shin that is tender with mild ankle swelling and ecchymosis Psychiatric: Normal mood and affect. Behavior is normal.  Assessment & Plan:    See Problem List for Assessment and Plan of chronic medical problems.

## 2018-02-11 ENCOUNTER — Encounter: Payer: Self-pay | Admitting: Internal Medicine

## 2018-02-11 ENCOUNTER — Ambulatory Visit (INDEPENDENT_AMBULATORY_CARE_PROVIDER_SITE_OTHER): Payer: Medicare Other | Admitting: Internal Medicine

## 2018-02-11 VITALS — BP 134/76 | HR 74 | Temp 98.6°F | Resp 16 | Wt 112.0 lb

## 2018-02-11 DIAGNOSIS — F0391 Unspecified dementia with behavioral disturbance: Secondary | ICD-10-CM

## 2018-02-11 DIAGNOSIS — I2510 Atherosclerotic heart disease of native coronary artery without angina pectoris: Secondary | ICD-10-CM | POA: Diagnosis not present

## 2018-02-11 DIAGNOSIS — K219 Gastro-esophageal reflux disease without esophagitis: Secondary | ICD-10-CM

## 2018-02-11 DIAGNOSIS — R0789 Other chest pain: Secondary | ICD-10-CM | POA: Diagnosis not present

## 2018-02-11 DIAGNOSIS — S81811A Laceration without foreign body, right lower leg, initial encounter: Secondary | ICD-10-CM

## 2018-02-11 DIAGNOSIS — F03B18 Unspecified dementia, moderate, with other behavioral disturbance: Secondary | ICD-10-CM

## 2018-02-11 DIAGNOSIS — D509 Iron deficiency anemia, unspecified: Secondary | ICD-10-CM

## 2018-02-11 MED ORDER — PANTOPRAZOLE SODIUM 20 MG PO TBEC: 20.0000 mg | DELAYED_RELEASE_TABLET | Freq: Every day | ORAL | 3 refills | Status: DC

## 2018-02-11 MED ORDER — POLYSACCHARIDE IRON COMPLEX 150 MG PO CAPS: 150.0000 mg | ORAL_CAPSULE | Freq: Two times a day (BID) | ORAL | 3 refills | Status: DC

## 2018-02-11 NOTE — Assessment & Plan Note (Signed)
Work up in ED negative for cardiac cause - possible atypical GERD No complaints of chest pain since emergency room visits No further evaluation necessary Continue pantoprazole for possible GERD

## 2018-02-11 NOTE — Assessment & Plan Note (Signed)
aricept stopped due to frequency of bowels and that has improved since stopping the medication and stool less loose.  Will follow up with Dr Delice Lesch

## 2018-02-11 NOTE — Assessment & Plan Note (Signed)
GI evaluation in the past negative for GI source Continue iron supplementation-increase to twice daily Anemia has remained stable Recheck CBC, iron levels at next visit

## 2018-02-11 NOTE — Assessment & Plan Note (Signed)
Skin tears w/o complication or infection Local wound care - antibacterial ointment Keep wrapped

## 2018-02-11 NOTE — Assessment & Plan Note (Signed)
?   Chest pain atypical GERD Will continue protonix 20 mg daily

## 2018-02-11 NOTE — Patient Instructions (Signed)
Continue pantoprazole 20 mg daily - a new prescription was sent to the pharmacy.   Take the iron pill twice daily.  This was also sent to the pharmacy.     Continue wound care as you are doing on the right lower leg.   Follow up this summer as scheduled, sooner if needed

## 2018-02-23 ENCOUNTER — Encounter: Payer: Self-pay | Admitting: Internal Medicine

## 2018-02-23 ENCOUNTER — Ambulatory Visit (INDEPENDENT_AMBULATORY_CARE_PROVIDER_SITE_OTHER): Payer: Medicare Other | Admitting: Internal Medicine

## 2018-02-23 ENCOUNTER — Ambulatory Visit (INDEPENDENT_AMBULATORY_CARE_PROVIDER_SITE_OTHER)
Admission: RE | Admit: 2018-02-23 | Discharge: 2018-02-23 | Disposition: A | Discharge status: Home / Self Care | Payer: Medicare Other | Source: Ambulatory Visit | Attending: Internal Medicine | Admitting: Internal Medicine

## 2018-02-23 ENCOUNTER — Ambulatory Visit: Payer: Self-pay

## 2018-02-23 VITALS — BP 136/68 | HR 94 | Temp 99.2°F | Resp 16 | Wt 110.0 lb

## 2018-02-23 DIAGNOSIS — R059 Cough, unspecified: Secondary | ICD-10-CM

## 2018-02-23 DIAGNOSIS — R05 Cough: Secondary | ICD-10-CM

## 2018-02-23 DIAGNOSIS — R531 Weakness: Secondary | ICD-10-CM | POA: Diagnosis not present

## 2018-02-23 DIAGNOSIS — I2510 Atherosclerotic heart disease of native coronary artery without angina pectoris: Secondary | ICD-10-CM | POA: Diagnosis not present

## 2018-02-23 MED ORDER — DOXYCYCLINE HYCLATE 100 MG PO TABS: 100.0000 mg | ORAL_TABLET | Freq: Two times a day (BID) | ORAL | 0 refills | Status: DC

## 2018-02-23 NOTE — Assessment & Plan Note (Signed)
Weakness x few days - no localized - difficulty walking on own - both legs Started after cold symptoms Will treat infection and expect weakness to improve - if no improvement her family will call

## 2018-02-23 NOTE — Patient Instructions (Signed)
Have a chest xray today.  Test(s) ordered today. Your results will be released to [place] (or called to you) after review, usually within 72hours after test completion. If any changes need to be made, you will be notified at that same time.   Medications reviewed and updated.  Changes include starting doxycycline for your cough.   Your prescription(s) have been submitted to your pharmacy. Please take as directed and contact our office if you believe you are having problem(s) with the medication(s).  Call if no improvement

## 2018-02-23 NOTE — Telephone Encounter (Signed)
Daughter reports increased weakness "over the past few days.We have to lift her for transfers." Reports she is eating and drinking fine. No fever. No medication changes. Has noticed "some sinus congestion." Appointment made for today.  Reason for Disposition . [1] MODERATE weakness (i.e., interferes with work, school, normal activities) AND [2] persists > 3 days  Answer Assessment - Initial Assessment Questions 1. DESCRIPTION: "Describe how you are feeling."     Weak 2. SEVERITY: "How bad is it?"  "Can you stand and walk?"   - MILD - Feels weak or tired, but does not interfere with work, school or normal activities   - [place] to stand and walk; weakness interferes with work, school, or normal activities   - SEVERE - Unable to stand or walk     Moderate 3. ONSET:  "When did the weakness begin?"     This weekness 4. CAUSE: "What do you think is causing the weakness?"     Unsure 5. MEDICINES: "Have you recently started a new medicine or had a change in the amount of a medicine?"     No 6. OTHER SYMPTOMS: "Do you have any other symptoms?" (e.g., chest pain, fever, cough, SOB, vomiting, diarrhea, bleeding)     Sinus congestion 7. PREGNANCY: "Is there any chance you are pregnant?" "When was your last menstrual period?"     No  Protocols used: WEAKNESS (GENERALIZED) AND FATIGUE-A-AH

## 2018-02-23 NOTE — Progress Notes (Addendum)
Subjective:    Patient ID: [name], female    DOB: Oct 16, 1925, 84 y.o.   MRN: [id number]  HPI The patient is here for an acute visit.  Weakness;  It started two days ago.  She was not able to walk or transfer on her own.  Her legs were like jelly.  The weakness is not localized.  She denies urinary symptoms.  Her appetite is good.    Sinus infection:  It started about 4-5 days ago.  She has been coughing.  She feels like the mucus is in her chest.  She is drinking a lot of water.  She denies fever or chills.  There is no change in appetite.   She denies sinus pain, sore throat, wheeze and sob.     Medications and allergies reviewed with patient and updated if appropriate.  Patient Active Problem List   Diagnosis Date Noted  . Skin tear of right lower leg without complication [id number]/41/[id number]  . Acute cystitis without hematuria 10/22/2017  . Left ankle pain 10/03/2017  . Moderate dementia with behavioral disturbance 03/21/2017  . Cerebral infarction ([place]) 12/27/2015  . Convulsions ([place]) 09/11/2015  . SVT (supraventricular tachycardia) ([place])   . Fall 08/21/2015  . PAF (paroxysmal atrial fibrillation) ([place])   . Stroke (cerebrum) ([place]) 08/20/2015  . Aphasia   . TIA (transient ischemic attack)   . FTT (failure to thrive) in adult   . Syncope 10/31/2014  . Diabetes mellitus type 2, controlled ([place]) 10/31/2014  . Uncontrolled hypertension 10/31/2014  . Incomplete emptying of bladder 05/17/2014  . Headache 11/26/2013  . Atypical chest pain 06/29/2013  . Urinary tract infection without hematuria 08/20/2011  . CAD, NATIVE VESSEL 02/14/2010  . GERD 02/14/2010  . ANEMIA-B12 DEFICIENCY 01/01/2010  . OSTEOARTHRITIS 01/01/2010  . PERSONAL HX COLONIC POLYPS 01/01/2010  . Iron deficiency anemia 12/28/2009  . DIZZINESS AND GIDDINESS 12/28/2009  . Lower abdominal pain 09/13/2009  . Chronic lower back pain 08/11/2008  . KNEE PAIN, RIGHT 02/11/2008  . Dyslipidemia 04/23/2007  .  Allergic rhinitis 04/23/2007  . GALLBLADDER DISEASE 04/23/2007  . DIVERTICULITIS, HX OF 04/23/2007    Current Outpatient Medications on File Prior to Visit  Medication Sig Dispense Refill  . acetaminophen (TYLENOL) 325 MG tablet Take 650 mg by mouth every 6 (six) hours as needed for mild pain.    [name] amLODipine (NORVASC) 5 MG tablet Take 1 tablet (5 mg total) daily by mouth. Please keep upcoming appt in February for future refills. Thanks 90 tablet 0  . atorvastatin (LIPITOR) 40 MG tablet Take 1 tablet (40 mg total) daily by mouth. Please keep upcoming appt in February for future refills. Thanks (Patient taking differently: Take 40 mg by mouth every evening. Please keep upcoming appt in February for future refills. Thanks) 90 tablet 0  . divalproex (DEPAKOTE ER) 250 MG 24 hr tablet Take 1 tablet (250 mg total) by mouth daily. 30 tablet 6  . ELIQUIS 2.5 MG TABS tablet TAKE 1 TABLET BY MOUTH TWICE A DAY (Patient taking differently: 2.5mg  by mouth twice daily) 180 tablet 2  . glucose blood (ONE TOUCH ULTRA TEST) test strip Use to check blood sugar 1 time per day. Dx Code: E11.9 100 each 12  . iron polysaccharides (FERREX 150) 150 MG capsule Take 1 capsule (150 mg total) by mouth 2 (two) times daily. 180 capsule 3  . metFORMIN (GLUCOPHAGE) 500 MG tablet Take 1 tablet (500 mg total) by mouth 2 (two) times daily with  a meal. 180 tablet 1  . metoprolol succinate (TOPROL-XL) 50 MG 24 hr tablet TAKE 1 TABLET BY MOUTH DAILY. TAKE WITH OR IMMEDIATELY FOLLOWING A MEAL. (Patient taking differently: 50mg  by mouth once daily) 90 tablet 0  . nitroGLYCERIN (NITROSTAT) 0.4 MG SL tablet Place 0.4 mg under the tongue every 5 (five) minutes as needed for chest pain. Reported on 12/21/2015    . ONETOUCH DELICA LANCETS 14H MISC Use 1 lancet to check glucose once daily 100 each 1  . OVER THE COUNTER MEDICATION Place 1 patch onto the skin daily as needed (pain). Arthritis pain reliever patch    . pantoprazole (PROTONIX) 20 MG  tablet Take 1 tablet (20 mg total) by mouth daily. 90 tablet 3  . potassium chloride (KLOR-CON 10) 10 MEQ tablet Take 1 tablet (10 mEq total) by mouth daily. 90 tablet 3   No current facility-administered medications on file prior to visit.     Past Medical History:  Diagnosis Date  . Abdominal pain   . Allergic rhinitis, cause unspecified   . Anemia B twelve deficiency   . Arrhythmia    paroxysmal atrial fibrillation  . Arthritis    "arms and hands" (06/30/2013)  . Benign neoplasm of colon   . Colon polyp   . Coronary artery disease   . Degeneration of lumbar or lumbosacral intervertebral disc   . Dizziness - giddy   . Edema   . GERD (gastroesophageal reflux disease)   . Hyperlipidemia   . Hypertension   . Iron deficiency anemia, unspecified   . Lumbago   . Pernicious anemia   . Seizures ([place])   . Stroke ([place])   . Symptomatic menopausal or female climacteric states   . Thoracic or lumbosacral neuritis or radiculitis, unspecified   . Type II diabetes mellitus ([place])     Past Surgical History:  Procedure Laterality Date  . ABDOMINAL HYSTERECTOMY  1974  . APPENDECTOMY  1974  . CARDIAC CATHETERIZATION    . CARPAL TUNNEL RELEASE Bilateral 1970's  . CATARACT EXTRACTION, BILATERAL Bilateral   . CHOLECYSTECTOMY    . CORONARY ANGIOPLASTY WITH STENT PLACEMENT     "1" (06/30/2013  . LUMBAR DISC SURGERY      Social History   Socioeconomic History  . Marital status: Widowed    Spouse name: Not on file  . Number of children: 8  . Years of education: 10th Grade  . Highest education level: Not on file  Occupational History  . Not on file  Social Needs  . Financial resource strain: Not on file  . Food insecurity:    Worry: Not on file    Inability: Not on file  . Transportation needs:    Medical: Not on file    Non-medical: Not on file  Tobacco Use  . Smoking status: Never Smoker  . Smokeless tobacco: Current User    Types: Snuff  Substance and Sexual Activity  .  Alcohol use: No    Alcohol/week: 0.0 oz  . Drug use: No  . Sexual activity: Never  Lifestyle  . Physical activity:    Days per week: Not on file    Minutes per session: Not on file  . Stress: Not on file  Relationships  . Social connections:    Talks on phone: Not on file    Gets together: Not on file    Attends religious service: Not on file    Active member of club or organization: Not on file  Attends meetings of clubs or organizations: Not on file    Relationship status: Not on file  Other Topics Concern  . Not on file  Social History Narrative   Patient lives with daughter in a two story home.   Patient has a 10th grade education.   Patient is retired.   Patient is right handed.    Family History  Problem Relation Age of Onset  . Breast cancer Sister        "behind heart"  . Colon cancer Sister   . Colon cancer Sister   . Breast cancer Daughter   . Breast cancer Daughter   . Breast cancer Daughter   . Kidney cancer Daughter     Review of Systems  Constitutional: Positive for fatigue. Negative for appetite change, chills and fever.  HENT: Positive for postnasal drip. Negative for congestion, ear pain, sinus pressure, sinus pain and sore throat.   Respiratory: Positive for cough. Negative for shortness of breath and wheezing.   Gastrointestinal: Negative for abdominal pain and nausea.  Genitourinary: Negative for dysuria and hematuria.  Neurological: Negative for headaches.       Objective:   Vitals:   02/23/18 1311  BP: 136/68  Pulse: 94  Resp: 16  Temp: 99.2 F (37.3 C)  SpO2: 96%   BP Readings from Last 3 Encounters:  02/23/18 136/68  02/11/18 134/76  02/08/18 (!) 155/68   Wt Readings from Last 3 Encounters:  02/23/18 110 lb (49.9 kg)  02/11/18 112 lb (50.8 kg)  02/08/18 110 lb (49.9 kg)   Body mass index is 20.78 kg/m.   Physical Exam    GENERAL APPEARANCE: Appears stated age, well appearing, NAD EYES: conjunctiva clear, no  icterus HEENT: bilateral tympanic membranes and ear canals normal, oropharynx with no erythema, no sinus pain, no thyromegaly, trachea midline, no cervical or supraclavicular lymphadenopathy LUNGS: unlabored breathing, good air entry bilaterally, no rales or wheeze, coarse BS on right > left - very mild - clears slightly with cough CARDIOVASCULAR: Normal S1,S2 without murmurs, no edema Abd: soft , NT, ND SKIN: Warm, dry      Assessment & Plan:    See Problem List for Assessment and Plan of chronic medical problems.

## 2018-02-23 NOTE — Assessment & Plan Note (Signed)
Cough from mucus in chest - concerning for possible PNA, less likely sinus infection Low grade fever and generalized weakness cxr today otc cough syrup Doxycycline bid x 10 days Family to monitor closely and call if no improvement or worsening

## 2018-03-04 ENCOUNTER — Other Ambulatory Visit: Payer: Self-pay | Admitting: Cardiovascular Disease

## 2018-03-13 ENCOUNTER — Ambulatory Visit: Payer: PRIVATE HEALTH INSURANCE | Admitting: Podiatry

## 2018-03-19 ENCOUNTER — Other Ambulatory Visit: Payer: Self-pay | Admitting: Neurology

## 2018-03-19 DIAGNOSIS — F03B18 Unspecified dementia, moderate, with other behavioral disturbance: Secondary | ICD-10-CM

## 2018-03-19 DIAGNOSIS — F0391 Unspecified dementia with behavioral disturbance: Secondary | ICD-10-CM

## 2018-03-23 ENCOUNTER — Other Ambulatory Visit: Payer: Self-pay

## 2018-03-23 ENCOUNTER — Encounter: Payer: Self-pay | Admitting: Neurology

## 2018-03-23 ENCOUNTER — Ambulatory Visit (INDEPENDENT_AMBULATORY_CARE_PROVIDER_SITE_OTHER): Payer: Medicare Other | Admitting: Neurology

## 2018-03-23 DIAGNOSIS — F0391 Unspecified dementia with behavioral disturbance: Secondary | ICD-10-CM | POA: Diagnosis not present

## 2018-03-23 DIAGNOSIS — I2510 Atherosclerotic heart disease of native coronary artery without angina pectoris: Secondary | ICD-10-CM

## 2018-03-23 DIAGNOSIS — F03B18 Unspecified dementia, moderate, with other behavioral disturbance: Secondary | ICD-10-CM

## 2018-03-23 MED ORDER — DIVALPROEX SODIUM ER 250 MG PO TB24: ORAL_TABLET | ORAL | 11 refills | Status: DC

## 2018-03-23 MED ORDER — MEMANTINE HCL 10 MG PO TABS: 10.0000 mg | ORAL_TABLET | Freq: Two times a day (BID) | ORAL | 11 refills | Status: DC

## 2018-03-23 NOTE — Patient Instructions (Signed)
1. Start Memantine (Namenda) 10mg : take 1 tablet twice a day 2. For behavior, start Depakote ER 250mg : take 1 tablet at night for a week, may increase to 1 tablet twice a day if needed 3. Follow-up in 6 months, call for any changes  FALL PRECAUTIONS: Be cautious when walking. Scan the area for obstacles that may increase the risk of trips and falls. When getting up in the mornings, sit up at the edge of the bed for a few minutes before getting out of bed. Consider elevating the bed at the head end to avoid drop of blood pressure when getting up. Walk always in a well-lit room (use night lights in the walls). Avoid area rugs or power cords from appliances in the middle of the walkways. Use a walker or a cane if necessary and consider physical therapy for balance exercise. Get your eyesight checked regularly.  FINANCIAL OVERSIGHT: Supervision, especially oversight when making financial decisions or transactions is also recommended.  HOME SAFETY: Consider the safety of the kitchen when operating appliances like stoves, microwave oven, and blender. Consider having supervision and share cooking responsibilities until no longer able to participate in those. Accidents with firearms and other hazards in the house should be identified and addressed as well.  DRIVING: Regarding driving, in patients with progressive memory problems, driving will be impaired. We advise to have someone else do the driving if trouble finding directions or if minor accidents are reported. Independent driving assessment is available to determine safety of driving.  ABILITY TO BE LEFT ALONE: If patient is unable to contact 911 operator, consider using LifeLine, or when the need is there, arrange for someone to stay with patients. Smoking is a fire hazard, consider supervision or cessation. Risk of wandering should be assessed by caregiver and if detected at any point, supervision and safe proof recommendations should be  instituted.  MEDICATION SUPERVISION: Inability to self-administer medication needs to be constantly addressed. Implement a mechanism to ensure safe administration of the medications.  RECOMMENDATIONS FOR ALL PATIENTS WITH MEMORY PROBLEMS: 1. Continue to exercise (Recommend 30 minutes of walking everyday, or 3 hours every week) 2. Increase social interactions - continue going to [place] and enjoy social gatherings with friends and family 3. Eat healthy, avoid fried foods and eat more fruits and vegetables 4. Maintain adequate blood pressure, blood sugar, and blood cholesterol level. Reducing the risk of stroke and cardiovascular disease also helps promoting better memory. 5. Avoid stressful situations. Live a simple life and avoid aggravations. Organize your time and prepare for the next day in anticipation. 6. Sleep well, avoid any interruptions of sleep and avoid any distractions in the bedroom that may interfere with adequate sleep quality 7. Avoid sugar, avoid sweets as there is a strong link between excessive sugar intake, diabetes, and cognitive impairment The Mediterranean diet has been shown to help patients reduce the risk of progressive memory disorders and reduces cardiovascular risk. This includes eating fish, eat fruits and green leafy vegetables, nuts like almonds and hazelnuts, walnuts, and also use olive oil. Avoid fast foods and fried foods as much as possible. Avoid sweets and sugar as sugar use has been linked to worsening of memory function.  There is always a concern of gradual progression of memory problems. If this is the case, then we may need to adjust level of care according to patient needs. Support, both to the patient and caregiver, should then be put into place.

## 2018-03-23 NOTE — Progress Notes (Signed)
NEUROLOGY FOLLOW UP OFFICE NOTE  [name] [id number]  DOB: 09/29/1925  HISTORY OF PRESENT ILLNESS: I had the pleasure of seeing [place] Ze[place] in follow-up in the neurology clinic on 03/23/2018.  The patient was last seen a year ago for moderate dementia. She had initially presented for recurrent episodes of loss of consciousness and headaches. She is again accompanied by her daughter who helps supplement the history today. Her daughter denies any further syncopal episodes since June 2017. On her last visit, her daughter reported increased agitation and was instructed to take Depakote regularly. Her daughter reports they only give it prn for agitation. She does get agitated easily, she is paranoid there are people coming in to the house. Infrequently she has auditory hallucinations thinking someone is messing with the door. She had been on Aricept for a time, but has been having diarrhea and stopped this 2 months ago with improvement in GI symptoms. She has had several falls, she fell a couple of times last month. Her feels her memory is pretty good. Her daughter helps with bathing, she bathes 2-3 times a week and it is quite difficult to get her to bathe. She dresses herself. She denies any headaches, dizziness, focal numbness/tingling/weakness.   HPI: This is a pleasant 84 yo RH woman with a history of diabetes, hypertension, CAD, paroxysmal atrial fibrillation, dementia, with a 5-6 year history of recurrent episodes of loss of consciousness and headaches. Prior to hospital admission in August 2016, she had a total of 5 or 6 of these episodes. Once or twice, her daughter has seen her stare, then go out. It appeared she would just go to sleep. She had urinary incontinence at least twice with these. No convulsive activity noted. Episodes would last 5-10 minutes, then she would come to a little confused. She had an episode in January 2015, the the next episode occurred in August 2016. She was  brought to the ER on 07/11/15 for severe retro-orbital headaches, BP was noted to be 195/79. She was discharged home with improvement in headache and BP. She was brought back by family the next day for aphasia lasting 30-35 minutes. She was attempting to eat breakfast when she started "babbling" and appeared more confused. The cup she was holding was shaking and beginning to spill. This lasted a few minutes, and as she was improving, she said "why am I talking like this?" No focal weakness noted by family, she was arguing about what she wanted to wear when EMS arrived. She has no recollection of this. Per records, symptoms resolved upon EMS arrival. She was admitted for stroke workup. CBC, BMP were unremarkable except for glucose of 206. I personally reviewed MRI brain without contrast which did not show acute infarct, there was generalized atrophy and hydrocephalus ex vacuo, moderately advanced chronic microvascular disease. MRA showed intracranial atherosclerosis, no significant stenosis. Echo showed EF of 62-70%, grade 1 diastolic dysfunction, normal left atrium. On her third hospital day, she had an episode while sitting on a chair, when her whole body became stiff and she was unresponsive for 1-2 minutes. She was lowered to the ground and revived within a couple of sternal rubs. Vital signs were normal. Head CT was unremarkable. On further questioning, family reported previous episodes of transient loss of consciousness where she would suddenly collapse with loss of bladder control. Her wake and drowsy EEG was normal. She was very confused during her hospital stay, "she tried to escape," and was started on Seroquel. She was  discharged home back to baseline per family. She was brought back to the ER on 07/22/15 for slurred speech and lethargy, felt to be due to Seroquel, this has since been discontinued with no further similar episodes.   Her 24-hour ambulatory EEG in October 2016 did not show any epileptiform  discharges, there was occasional focal slowing over the left temporal region. She was admitted to [facility] for a right subcortical stroke on 08/20/15. She started having slurred speech on 08/18/15 and was brought to the ER the next day where she was evaluated by Neurology with note of normal neurological exam, recent TIA workup, head CT unremarkable. She was discharged home then returned the next day due to weakness, inability to walk, and fall. Family felt left side was weaker. MRI brain showed an acute stroke in the posterior right lentiform nucleus extending superiorly into the centrum semiovale. MRA showed intracranial stenosis. She had some worsening of symptoms in the hospital, and after discussion with family, it was decided to start Eliquis. Her BP medications were adjusted for permissive hypertension.  She feels her memory is pretty good. She lives with her daughter. Family started to notice memory changes several years ago, they administer her medications. They deny any hallucinations. She does not drive. She is taking Aricept 5mg  daily with no side effects. She denies any olfactory/gustatory hallucinations, deja vu, rising epigastric sensation, focal numbness/tingling/weakness, myoclonic jerks. There is a family history of seizures in her older sister, cousin, and grandson. Otherwise, she had a normal birth and early development. There is no history of febrile convulsions, CNS infections such as meningitis/encephalitis, significant traumatic brain injury, neurosurgical procedures.   PAST MEDICAL HISTORY: Past Medical History:  Diagnosis Date  . Abdominal pain   . Allergic rhinitis, cause unspecified   . Anemia B twelve deficiency   . Arrhythmia    paroxysmal atrial fibrillation  . Arthritis    "arms and hands" (06/30/2013)  . Benign neoplasm of colon   . Colon polyp   . Coronary artery disease   . Degeneration of lumbar or lumbosacral intervertebral disc   . Dizziness - giddy   . Edema   .  GERD (gastroesophageal reflux disease)   . Hyperlipidemia   . Hypertension   . Iron deficiency anemia, unspecified   . Lumbago   . Pernicious anemia   . Seizures ([place])   . Stroke ([place])   . Symptomatic menopausal or female climacteric states   . Thoracic or lumbosacral neuritis or radiculitis, unspecified   . Type II diabetes mellitus ([facility])     MEDICATIONS: Current Outpatient Medications on File Prior to Visit  Medication Sig Dispense Refill  . acetaminophen (TYLENOL) 325 MG tablet Take 650 mg by mouth every 6 (six) hours as needed for mild pain.    [name] amLODipine (NORVASC) 5 MG tablet Take 1 tablet (5 mg total) daily by mouth. Please keep upcoming appt in February for future refills. Thanks 90 tablet 0  . atorvastatin (LIPITOR) 40 MG tablet Take 1 tablet (40 mg total) daily by mouth. Please keep upcoming appt in February for future refills. Thanks (Patient taking differently: Take 40 mg by mouth every evening. Please keep upcoming appt in February for future refills. Thanks) 90 tablet 0  . divalproex (DEPAKOTE ER) 250 MG 24 hr tablet Take 1 tablet (250 mg total) by mouth daily. 30 tablet 6  . ELIQUIS 2.5 MG TABS tablet TAKE 1 TABLET BY MOUTH TWICE A DAY (Patient taking differently: 2.5mg  by mouth twice daily)  180 tablet 2  . glucose blood (ONE TOUCH ULTRA TEST) test strip Use to check blood sugar 1 time per day. Dx Code: E11.9 100 each 12  . iron polysaccharides (FERREX 150) 150 MG capsule Take 1 capsule (150 mg total) by mouth 2 (two) times daily. 180 capsule 3  . metFORMIN (GLUCOPHAGE) 500 MG tablet Take 1 tablet (500 mg total) by mouth 2 (two) times daily with a meal. 180 tablet 1  . metoprolol succinate (TOPROL-XL) 50 MG 24 hr tablet TAKE 1 TABLET BY MOUTH DAILY. TAKE WITH OR IMMEDIATELY FOLLOWING A MEAL. 90 tablet 2  . nitroGLYCERIN (NITROSTAT) 0.4 MG SL tablet Place 0.4 mg under the tongue every 5 (five) minutes as needed for chest pain. Reported on 12/21/2015    . ONETOUCH DELICA LANCETS  17E MISC Use 1 lancet to check glucose once daily 100 each 1  . OVER THE COUNTER MEDICATION Place 1 patch onto the skin daily as needed (pain). Arthritis pain reliever patch    . pantoprazole (PROTONIX) 20 MG tablet Take 1 tablet (20 mg total) by mouth daily. 90 tablet 3  . potassium chloride (KLOR-CON 10) 10 MEQ tablet Take 1 tablet (10 mEq total) by mouth daily. 90 tablet 3  . donepezil (ARICEPT) 10 MG tablet TAKE 1 TABLET BY MOUTH EVERYDAY AT BEDTIME (Patient not taking: Reported on 03/23/2018) 90 tablet 3   No current facility-administered medications on file prior to visit.     ALLERGIES: Allergies  Allergen Reactions  . Penicillins Swelling      . Sulfonamide Derivatives Other (See Comments)    REACTION: "like my head was full of water"  . Albumin (Human) Other (See Comments)    Doesn't remember   . Clindamycin Other (See Comments)    Doesn't remember   . Iodides Hives  . Iodinated Diagnostic Agents Hives  . Sulfa Antibiotics Hives  . Celecoxib Itching and Rash  . Erythromycin Itching and Rash  . Nitrofuran Derivatives Rash  . Nitrofurantoin Itching and Rash  . Piroxicam Other (See Comments)    unknown  . Povidone-Iodine Itching and Rash    FAMILY HISTORY: Family History  Problem Relation Age of Onset  . Breast cancer Sister        "behind heart"  . Colon cancer Sister   . Colon cancer Sister   . Breast cancer Daughter   . Breast cancer Daughter   . Breast cancer Daughter   . Kidney cancer Daughter     SOCIAL HISTORY: Social History   Socioeconomic History  . Marital status: Widowed    Spouse name: Not on file  . Number of children: 8  . Years of education: 10th Grade  . Highest education level: Not on file  Occupational History  . Not on file  Social Needs  . Financial resource strain: Not on file  . Food insecurity:    Worry: Not on file    Inability: Not on file  . Transportation needs:    Medical: Not on file    Non-medical: Not on file    Tobacco Use  . Smoking status: Never Smoker  . Smokeless tobacco: Current User    Types: Snuff  Substance and Sexual Activity  . Alcohol use: No    Alcohol/week: 0.0 oz  . Drug use: No  . Sexual activity: Never  Lifestyle  . Physical activity:    Days per week: Not on file    Minutes per session: Not on file  . Stress: Not  on file  Relationships  . Social connections:    Talks on phone: Not on file    Gets together: Not on file    Attends religious service: Not on file    Active member of club or organization: Not on file    Attends meetings of clubs or organizations: Not on file    Relationship status: Not on file  . Intimate partner violence:    Fear of current or ex partner: Not on file    Emotionally abused: Not on file    Physically abused: Not on file    Forced sexual activity: Not on file  Other Topics Concern  . Not on file  Social History Narrative   Patient lives with daughter in a two story home.   Patient has a 10th grade education.   Patient is retired.   Patient is right handed.    REVIEW OF SYSTEMS: Constitutional: No fevers, chills, or sweats, no generalized fatigue, change in appetite Eyes: No visual changes, double vision, eye pain Ear, nose and throat: No hearing loss, ear pain, nasal congestion, sore throat Cardiovascular: No chest pain, palpitations Respiratory:  No shortness of breath at rest or with exertion, wheezes GastrointestinaI: No nausea, vomiting, diarrhea, +abdominal pain, no fecal incontinence Genitourinary:  No dysuria, urinary retention or frequency Musculoskeletal:  No neck pain, +back pain Integumentary: No rash, pruritus, skin lesions Neurological: as above Psychiatric: No depression, insomnia, anxiety Endocrine: No palpitations, fatigue, diaphoresis, mood swings, change in appetite, change in weight, increased thirst Hematologic/Lymphatic:  No anemia, purpura, petechiae. Allergic/Immunologic: no itchy/runny eyes, nasal  congestion, recent allergic reactions, rashes  PHYSICAL EXAM: Vitals:   03/23/18 0827  BP: (!) 146/64  Pulse: 73  SpO2: 95%   General: No acute distress, sitting in wheelchair Head:  Normocephalic/atraumatic Neck: supple, no paraspinal tenderness, full range of motion Heart:  Regular rate and rhythm Lungs:  Clear to auscultation bilaterally Back: No paraspinal tenderness Skin/Extremities: No rash, no edema Neurological Exam: alert and oriented to person, place,day of week. No aphasia or dysarthria. Fund of knowledge is appropriate.  Remote memory intact. 0/3 delayed recall. Attention and concentration are normal, missed one letter spelling WORLD, could not do it backward, could not do serial 7s. Able to name objects and repeat phrases. CDT 4/5  MMSE - Mini Mental State Exam 03/23/2018 03/17/2017 08/19/2016  Orientation to time 1 1 2   Orientation to Place 4 4 5   Registration 3 3 3   Attention/ Calculation 0 0 0  Recall 0 0 0  Language- name 2 objects 2 2 2   Language- repeat 1 1 1   Language- follow 3 step command 3 3 3   Language- read & follow direction 1 1 1   Write a sentence 1 1 1   Copy design 1 0 0  Total score 17 16 18     Cranial nerves: Pupils equal, round, reactive to light. Extraocular movements intact with no nystagmus. Visual fields full. Facial sensation intact. No facial asymmetry. Tongue, uvula, palate midline.  Motor: Bulk and tone normal, muscle strength 4/5 on left UE, otherwise 5/5 throughout (similar to prior).  Sensation to light touch intact.  No extinction to double simultaneous stimulation.  Deep tendon reflexes 2+ throughout, toes downgoing.  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  Gait not tested, sitting on wheelchair.  IMPRESSION: This is a pleasant 84 yo RH woman with a history of hypertension, diabetes, CAD, atrial fibrillation, mild to moderate dementia, stroke in October 2016 with  residual left hemiparesis, who initially presented for  evaluation of possible seizures. She has been having recurrent episodes of loss of consciousness for several years, admitted in August 2016 for transient aphasia, and during her hospital stay had an episode of generalized stiffening and unresponsiveness. She does have risk factors for seizures with dementia and family history of seizures. Convulsive syncope is also a consideration. She had a syncopal episode last June 2017, no seizure-like activity, with quick return to baseline. Her 24-hour EEG did not show any epileptiform discharges, there was occasional focal slowing over the left temporal region. She had a subcortical stroke in October 2016 and takes Eliquis. No further similar syncopal episodes since June 2017. Her daughter's main concern is the memory, MMSE today 17/30 (16/30 in May 2018, 18/30 in October 2017, 19/30 in September 2016). Diarrhea improved off Aricept, she will start Namenda 10mg  BID, side effects were discussed. We also discussed behavioral changes associated with dementia, her daughter was instructed to start giving Depakote ER 250mg  qhs on a regular basis, we may increase to BID dosing if needed. We again discussed the importance of control of vascular risk factors, physical exercise, and brain stimulation exercises for brain health. Her daughter was given resources to contact for in home aide. She will follow-up in 6 months and knows to call for any changes.  Thank you for allowing me to participate in her care.  Please do not hesitate to call for any questions or concerns.  The duration of this appointment visit was 30 minutes of face-to-face time with the patient.  Greater than 50% of this time was spent in counseling, explanation of diagnosis, planning of further management, and coordination of care.   Ellouise Newer, M.D.   CC: Dr. Quay Burow

## 2018-03-24 ENCOUNTER — Other Ambulatory Visit: Payer: Self-pay | Admitting: Cardiovascular Disease

## 2018-04-15 ENCOUNTER — Other Ambulatory Visit: Payer: Self-pay | Admitting: Neurology

## 2018-04-16 ENCOUNTER — Other Ambulatory Visit: Payer: Self-pay | Admitting: Endocrinology

## 2018-04-16 ENCOUNTER — Other Ambulatory Visit: Payer: Self-pay | Admitting: Internal Medicine

## 2018-04-17 NOTE — Telephone Encounter (Signed)
Last ov 08/12/16 1 no-show and no future scheduled refill or refuse please advise

## 2018-04-17 NOTE — Telephone Encounter (Signed)
Please refill x 3 mos Ov is due 

## 2018-04-23 ENCOUNTER — Other Ambulatory Visit: Payer: Self-pay

## 2018-04-23 MED ORDER — GLUCOSE BLOOD VI STRP: ORAL_STRIP | 0 refills | Status: DC

## 2018-05-01 ENCOUNTER — Encounter: Payer: Self-pay | Admitting: Podiatry

## 2018-05-01 ENCOUNTER — Ambulatory Visit (INDEPENDENT_AMBULATORY_CARE_PROVIDER_SITE_OTHER): Payer: Medicare Other | Admitting: Podiatry

## 2018-05-01 DIAGNOSIS — B351 Tinea unguium: Secondary | ICD-10-CM

## 2018-05-01 DIAGNOSIS — M79674 Pain in right toe(s): Secondary | ICD-10-CM

## 2018-05-01 DIAGNOSIS — M79675 Pain in left toe(s): Secondary | ICD-10-CM | POA: Diagnosis not present

## 2018-05-04 NOTE — Progress Notes (Signed)
Subjective: [name] is a 84 y.o. AAF who presents today with her daughter. [name] has diabetes and cc of painful, discolored, thick toenails which interfere with activities of daily living. Pain is aggravated when wearing enclosed shoe gear. Pain is relieved with periodic professional debridement.  Objective: Vascular Examination: Capillary refill time immediate x 10 digits Dorsalis pedis and posterior tibial pulses present b/l No digital hair x 10 digits Skin temperature warm to warm b/l  Dermatological Examination: Skin thin and atrophic b/l Toenails 1-5 b/l discolored, thick, dystrophic with subungual debris and pain with palpation to nailbeds due to thickness of nails.  Musculoskeletal: Muscle strength 5/5 to all LE muscle groups No crepitus or effusions noted. No gross pedal abnormalities  Neurological: Sensation intact with 10 gram monofilament. Vibratory sensation intact.  Assessment: Painful onychomycosis toenails 1-5 b/l   Plan: 1. Toenails 1-5 b/l were debrided in length and girth without iatrogenic bleeding. 2. Patient to continue soft, supportive shoe gear 3. Patient to report any pedal injuries to medical professional immediately. 4. Follow up 3 months.  5. Patient/POA to call should there be a concern in the interim.

## 2018-05-24 NOTE — Patient Instructions (Addendum)
  Test(s) ordered today. Your results will be released to [place] (or called to you) after review, usually within 72hours after test completion. If any changes need to be made, you will be notified at that same time.   Medications reviewed and updated.  Changes include starting avapro daily for blood pressure.   Monitor your BP at home.    Your prescription(s) have been submitted to your pharmacy. Please take as directed and contact our office if you believe you are having problem(s) with the medication(s).  A referral for home PT was ordered.  Please followup in 6 months

## 2018-05-24 NOTE — Progress Notes (Signed)
Subjective:    Patient ID: [name], female    DOB: 1925-01-09, 84 y.o.   MRN: [id number]  HPI The patient is here for follow up.  Diabetes: She is taking her medication daily as prescribed. She is compliant with a diabetic diet. She is not exercising regularly.  She is up-to-date with an ophthalmology examination.   PAfib, CAD, Hypertension: She is taking her medication daily. She is compliant with a low sodium diet.  She denies chest pain, palpitations, edema, shortness of breath and regular headaches. She is not exercising regularly.  She does monitor her blood pressure at home - it is high some days.    Hyperlipidemia: She is taking her medication daily. She is compliant with a low fat/cholesterol diet. She is not exercising regularly. She denies myalgias.   GERD:  She is taking her medication daily as prescribed.  She denies any GERD symptoms and feels her GERD is well controlled.   Headaches:  She has been having some headaches.  Tingling sensation and pain on scalp.  It is intermittent.    Poor balance, stiffness:  She is not moving much and becoming more stiff.   She always needs assistance to ambulate.  Hr balance is very poor.    Medications and allergies reviewed with patient and updated if appropriate.  Patient Active Problem List   Diagnosis Date Noted  . Generalized weakness 02/23/2018  . Skin tear of right lower leg without complication 84/13/2440  . Acute cystitis without hematuria 10/22/2017  . Left ankle pain 10/03/2017  . Moderate dementia with behavioral disturbance 03/21/2017  . Cerebral infarction ([place]) 12/27/2015  . Convulsions ([place]) 09/11/2015  . SVT (supraventricular tachycardia) ([place])   . Fall 08/21/2015  . PAF (paroxysmal atrial fibrillation) ([place])   . Stroke (cerebrum) ([place]) 08/20/2015  . Aphasia   . TIA (transient ischemic attack)   . FTT (failure to thrive) in adult   . Syncope 10/31/2014  . Diabetes mellitus type 2, controlled ([place])  10/31/2014  . Uncontrolled hypertension 10/31/2014  . Incomplete emptying of bladder 05/17/2014  . Headache 11/26/2013  . Atypical chest pain 06/29/2013  . Urinary tract infection without hematuria 08/20/2011  . CAD, NATIVE VESSEL 02/14/2010  . GERD 02/14/2010  . ANEMIA-B12 DEFICIENCY 01/01/2010  . OSTEOARTHRITIS 01/01/2010  . PERSONAL HX COLONIC POLYPS 01/01/2010  . Iron deficiency anemia 12/28/2009  . DIZZINESS AND GIDDINESS 12/28/2009  . Lower abdominal pain 09/13/2009  . Cough 12/13/2008  . Chronic lower back pain 08/11/2008  . KNEE PAIN, RIGHT 02/11/2008  . Dyslipidemia 04/23/2007  . Allergic rhinitis 04/23/2007  . GALLBLADDER DISEASE 04/23/2007  . DIVERTICULITIS, HX OF 04/23/2007    Current Outpatient Medications on File Prior to Visit  Medication Sig Dispense Refill  . acetaminophen (TYLENOL) 325 MG tablet Take 650 mg by mouth every 6 (six) hours as needed for mild pain.    [name] amLODipine (NORVASC) 5 MG tablet TAKE 1 TABLET BY MOUTH DAILY PLEASE KEEP UPCOMING APPT IN FEBRUARY FOR FUTURE REFILLS. 90 tablet 3  . atorvastatin (LIPITOR) 40 MG tablet Take 1 tablet daily 90 tablet 3  . divalproex (DEPAKOTE ER) 250 MG 24 hr tablet Take 1 tablet at night for a week, then increase to 1 tablet twice a day 60 tablet 11  . ELIQUIS 2.5 MG TABS tablet TAKE 1 TABLET BY MOUTH TWICE A DAY (Patient taking differently: 2.5mg  by mouth twice daily) 180 tablet 2  . iron polysaccharides (FERREX 150) 150 MG capsule Take 1  capsule (150 mg total) by mouth 2 (two) times daily. 180 capsule 3  . Lancets (ONETOUCH ULTRASOFT) lancets USE 1  TO CHECK GLUCOSE ONCE DAILY 100 each 1  . memantine (NAMENDA) 10 MG tablet TAKE 1 TABLET BY MOUTH TWICE A DAY 180 tablet 4  . metFORMIN (GLUCOPHAGE) 500 MG tablet Take 1 tablet (500 mg total) by mouth 2 (two) times daily with a meal. 180 tablet 1  . metoprolol succinate (TOPROL-XL) 50 MG 24 hr tablet TAKE 1 TABLET BY MOUTH DAILY. TAKE WITH OR IMMEDIATELY FOLLOWING A MEAL.  90 tablet 2  . nitroGLYCERIN (NITROSTAT) 0.4 MG SL tablet Place 0.4 mg under the tongue every 5 (five) minutes as needed for chest pain. Reported on 12/21/2015    . ONETOUCH DELICA LANCETS 40J MISC Use 1 lancet to check glucose once daily 100 each 1  . OVER THE COUNTER MEDICATION Place 1 patch onto the skin daily as needed (pain). Arthritis pain reliever patch    . pantoprazole (PROTONIX) 20 MG tablet Take 1 tablet (20 mg total) by mouth daily. 90 tablet 3  . potassium chloride (KLOR-CON 10) 10 MEQ tablet Take 1 tablet (10 mEq total) by mouth daily. 90 tablet 3   No current facility-administered medications on file prior to visit.     Past Medical History:  Diagnosis Date  . Abdominal pain   . Allergic rhinitis, cause unspecified   . Anemia B twelve deficiency   . Arrhythmia    paroxysmal atrial fibrillation  . Arthritis    "arms and hands" (06/30/2013)  . Benign neoplasm of colon   . Colon polyp   . Coronary artery disease   . Degeneration of lumbar or lumbosacral intervertebral disc   . Dizziness - giddy   . Edema   . GERD (gastroesophageal reflux disease)   . Hyperlipidemia   . Hypertension   . Iron deficiency anemia, unspecified   . Lumbago   . Pernicious anemia   . Seizures ([place])   . Stroke ([place])   . Symptomatic menopausal or female climacteric states   . Thoracic or lumbosacral neuritis or radiculitis, unspecified   . Type II diabetes mellitus ([place])     Past Surgical History:  Procedure Laterality Date  . ABDOMINAL HYSTERECTOMY  1974  . APPENDECTOMY  1974  . CARDIAC CATHETERIZATION    . CARPAL TUNNEL RELEASE Bilateral 1970's  . CATARACT EXTRACTION, BILATERAL Bilateral   . CHOLECYSTECTOMY    . CORONARY ANGIOPLASTY WITH STENT PLACEMENT     "1" (06/30/2013  . LUMBAR DISC SURGERY      Social History   Socioeconomic History  . Marital status: Widowed    Spouse name: Not on file  . Number of children: 8  . Years of education: 10th Grade  . Highest education  level: Not on file  Occupational History  . Not on file  Social Needs  . Financial resource strain: Not on file  . Food insecurity:    Worry: Not on file    Inability: Not on file  . Transportation needs:    Medical: Not on file    Non-medical: Not on file  Tobacco Use  . Smoking status: Never Smoker  . Smokeless tobacco: Current User    Types: Snuff  Substance and Sexual Activity  . Alcohol use: No    Alcohol/week: 0.0 oz  . Drug use: No  . Sexual activity: Never  Lifestyle  . Physical activity:    Days per week: Not on file  Minutes per session: Not on file  . Stress: Not on file  Relationships  . Social connections:    Talks on phone: Not on file    Gets together: Not on file    Attends religious service: Not on file    Active member of club or organization: Not on file    Attends meetings of clubs or organizations: Not on file    Relationship status: Not on file  Other Topics Concern  . Not on file  Social History Narrative   Patient lives with daughter in a two story home.   Patient has a 10th grade education.   Patient is retired.   Patient is right handed.    Family History  Problem Relation Age of Onset  . Breast cancer Sister        "behind heart"  . Colon cancer Sister   . Colon cancer Sister   . Breast cancer Daughter   . Breast cancer Daughter   . Breast cancer Daughter   . Kidney cancer Daughter     Review of Systems  Constitutional: Negative for appetite change (good appetite), chills and fever.  Respiratory: Negative for cough, shortness of breath and wheezing.   Cardiovascular: Positive for leg swelling (left chronically swollen since cva). Negative for chest pain and palpitations.  Gastrointestinal: Negative for constipation.  Neurological: Positive for dizziness (occ when she gets up) and headaches.       Objective:   Vitals:   05/26/18 0926  BP: (!) 168/72  Pulse: 74  Resp: 16  Temp: (!) 97.5 F (36.4 C)  SpO2: 94%   BP  Readings from Last 3 Encounters:  05/26/18 (!) 168/72  03/23/18 (!) 146/64  02/23/18 136/68   Wt Readings from Last 3 Encounters:  05/26/18 117 lb (53.1 kg)  03/23/18 109 lb (49.4 kg)  02/23/18 110 lb (49.9 kg)   Body mass index is 22.85 kg/m.   Physical Exam    Constitutional: Appears well-developed and well-nourished. No distress.  HENT:  Head: Normocephalic and atraumatic.  Neck: Neck supple. No tracheal deviation present. No thyromegaly present.  No cervical lymphadenopathy Cardiovascular: Normal rate, regular rhythm and normal heart sounds.   2/6 systolic murmur heard. No carotid bruit .  1 + b/l LE edema Pulmonary/Chest: Effort normal and breath sounds normal. No respiratory distress. No has no wheezes. No rales.  Skin: Skin is warm and dry. Not diaphoretic.  Psychiatric: Normal mood and affect. Behavior is normal.      Assessment & Plan:    See Problem List for Assessment and Plan of chronic medical problems.

## 2018-05-26 ENCOUNTER — Ambulatory Visit (INDEPENDENT_AMBULATORY_CARE_PROVIDER_SITE_OTHER): Payer: Medicare Other | Admitting: Internal Medicine

## 2018-05-26 ENCOUNTER — Encounter: Payer: Self-pay | Admitting: Internal Medicine

## 2018-05-26 ENCOUNTER — Ambulatory Visit: Payer: Medicare Other

## 2018-05-26 ENCOUNTER — Other Ambulatory Visit (INDEPENDENT_AMBULATORY_CARE_PROVIDER_SITE_OTHER): Payer: Medicare Other

## 2018-05-26 VITALS — BP 168/72 | HR 74 | Temp 97.5°F | Resp 16 | Wt 117.0 lb

## 2018-05-26 DIAGNOSIS — I480 Paroxysmal atrial fibrillation: Secondary | ICD-10-CM | POA: Diagnosis not present

## 2018-05-26 DIAGNOSIS — I2510 Atherosclerotic heart disease of native coronary artery without angina pectoris: Secondary | ICD-10-CM

## 2018-05-26 DIAGNOSIS — N181 Chronic kidney disease, stage 1: Secondary | ICD-10-CM

## 2018-05-26 DIAGNOSIS — R2689 Other abnormalities of gait and mobility: Secondary | ICD-10-CM | POA: Diagnosis not present

## 2018-05-26 DIAGNOSIS — R5381 Other malaise: Secondary | ICD-10-CM | POA: Diagnosis not present

## 2018-05-26 DIAGNOSIS — R51 Headache: Secondary | ICD-10-CM

## 2018-05-26 DIAGNOSIS — E785 Hyperlipidemia, unspecified: Secondary | ICD-10-CM

## 2018-05-26 DIAGNOSIS — E1122 Type 2 diabetes mellitus with diabetic chronic kidney disease: Secondary | ICD-10-CM

## 2018-05-26 DIAGNOSIS — D509 Iron deficiency anemia, unspecified: Secondary | ICD-10-CM

## 2018-05-26 DIAGNOSIS — R519 Headache, unspecified: Secondary | ICD-10-CM

## 2018-05-26 DIAGNOSIS — I10 Essential (primary) hypertension: Secondary | ICD-10-CM | POA: Diagnosis not present

## 2018-05-26 LAB — COMPREHENSIVE METABOLIC PANEL
ALT: 13 U/L (ref 0–35)
AST: 20 U/L (ref 0–37)
Albumin: 3.8 g/dL (ref 3.5–5.2)
Alkaline Phosphatase: 67 U/L (ref 39–117)
BUN: 25 mg/dL — ABNORMAL HIGH (ref 6–23)
CO2: 26 mEq/L (ref 19–32)
Calcium: 9.1 mg/dL (ref 8.4–10.5)
Chloride: 106 mEq/L (ref 96–112)
Creatinine, Ser: 0.87 mg/dL (ref 0.40–1.20)
GFR: 78.4 mL/min (ref >60.00)
Glucose, Bld: 157 mg/dL — ABNORMAL HIGH (ref 70–99)
Potassium: 3.8 mEq/L (ref 3.5–5.1)
Sodium: 139 mEq/L (ref 135–145)
Total Bilirubin: 0.5 mg/dL (ref 0.2–1.2)
Total Protein: 6.8 g/dL (ref 6.0–8.3)

## 2018-05-26 LAB — TSH: TSH: 11.48 u[IU]/mL — ABNORMAL HIGH (ref 0.35–4.50)

## 2018-05-26 LAB — LIPID PANEL
Cholesterol: 129 mg/dL (ref 0–200)
HDL: 54.3 mg/dL (ref >39.00)
LDL Cholesterol: 57 mg/dL (ref 0–99)
NonHDL: 74.44
Total CHOL/HDL Ratio: 2
Triglycerides: 89 mg/dL (ref 0.0–149.0)
VLDL: 17.8 mg/dL (ref 0.0–40.0)

## 2018-05-26 LAB — CBC WITH DIFFERENTIAL/PLATELET
Basophils Absolute: 0 10*3/uL (ref 0.0–0.1)
Basophils Relative: 0.3 % (ref 0.0–3.0)
Eosinophils Absolute: 0.1 10*3/uL (ref 0.0–0.7)
Eosinophils Relative: 2 % (ref 0.0–5.0)
HCT: 31.4 % — ABNORMAL LOW (ref 36.0–46.0)
Hemoglobin: 10.2 g/dL — ABNORMAL LOW (ref 12.0–15.0)
Lymphocytes Relative: 29.2 % (ref 12.0–46.0)
Lymphs Abs: 1.6 10*3/uL (ref 0.7–4.0)
MCHC: 32.6 g/dL (ref 30.0–36.0)
MCV: 89.3 fl (ref 78.0–100.0)
Monocytes Absolute: 0.6 10*3/uL (ref 0.1–1.0)
Monocytes Relative: 10.1 % (ref 3.0–12.0)
Neutro Abs: 3.2 10*3/uL (ref 1.4–7.7)
Neutrophils Relative %: 58.4 % (ref 43.0–77.0)
Platelets: 212 10*3/uL (ref 150.0–400.0)
RBC: 3.52 Mil/uL — ABNORMAL LOW (ref 3.87–5.11)
RDW: 16.2 % — ABNORMAL HIGH (ref 11.5–15.5)
WBC: 5.4 10*3/uL (ref 4.0–10.5)

## 2018-05-26 LAB — FERRITIN: Ferritin: 23.5 ng/mL (ref 10.0–291.0)

## 2018-05-26 LAB — IRON: Iron: 51 ug/dL (ref 42–145)

## 2018-05-26 LAB — HEMOGLOBIN A1C: Hgb A1c MFr Bld: 7.6 % — ABNORMAL HIGH (ref 4.6–6.5)

## 2018-05-26 MED ORDER — GLUCOSE BLOOD VI STRP: ORAL_STRIP | 3 refills | Status: DC

## 2018-05-26 MED ORDER — IRBESARTAN 75 MG PO TABS: 75.0000 mg | ORAL_TABLET | Freq: Every day | ORAL | 5 refills | Status: DC

## 2018-05-26 NOTE — Assessment & Plan Note (Signed)
Taking 1 iron tab BID Cbc, iron, ferritin

## 2018-05-26 NOTE — Assessment & Plan Note (Signed)
Asymptomatic Cbc On eliquis

## 2018-05-26 NOTE — Assessment & Plan Note (Signed)
Sugars have been well controlled Cmp, a1c On metformin - will adjust if needed

## 2018-05-26 NOTE — Assessment & Plan Note (Signed)
Check lipid panel  Continue daily statin Regular activity/exercise and healthy diet encouraged

## 2018-05-26 NOTE — Assessment & Plan Note (Signed)
Having headaches suggestive of neuralgia Discussed gabapentin - but we will hold off since she is not too bothered by it  -- her daughter will call if she wants to try it -- discussed possible side effects Can discuss with neuro

## 2018-05-26 NOTE — Assessment & Plan Note (Signed)
No concerning symptoms Cmp, cbc, tsh Continue current medications

## 2018-05-26 NOTE — Assessment & Plan Note (Signed)
BP not controlled and high at times at home Add avapro 75 mg daily Continue other meds Monitor BP at home

## 2018-05-26 NOTE — Assessment & Plan Note (Signed)
Will refer to home PT

## 2018-05-29 ENCOUNTER — Telehealth: Payer: Self-pay

## 2018-05-29 DIAGNOSIS — E039 Hypothyroidism, unspecified: Secondary | ICD-10-CM

## 2018-05-29 MED ORDER — LEVOTHYROXINE SODIUM 25 MCG PO TABS
25.0000 ug | ORAL_TABLET | Freq: Every day | ORAL | 1 refills | Status: DC
Start: 2018-05-29 — End: 2018-07-07

## 2018-05-29 NOTE — Telephone Encounter (Signed)
rx sent. tsh ordered.

## 2018-05-29 NOTE — Telephone Encounter (Signed)
-----   Message from Binnie Rail, MD sent at 05/29/2018  9:05 AM EDT ----- Cholesterol is very good. Iron levels are normal.  Anemia levels are stable.  Kidney and liver tests are normal.  Sugars are not ideally controlled - a1c is 7.6% - increase metformin to 1 pill in morning and 2 pills at dinner.  Thyroid underactive - start levothyroxine 25 mcg daily.  Recheck tsh in 6 weeks.

## 2018-05-29 NOTE — Telephone Encounter (Signed)
Pt's daughter has been informed and expressed understanding. Would like to proceed with the levothyroxine. Please send to pharmacy.

## 2018-05-29 NOTE — Addendum Note (Signed)
Addended by: Binnie Rail on: 05/29/2018 06:00 PM   Modules accepted: Orders

## 2018-06-03 DIAGNOSIS — M6281 Muscle weakness (generalized): Secondary | ICD-10-CM | POA: Diagnosis not present

## 2018-06-03 DIAGNOSIS — Z7984 Long term (current) use of oral hypoglycemic drugs: Secondary | ICD-10-CM | POA: Diagnosis not present

## 2018-06-03 DIAGNOSIS — I2510 Atherosclerotic heart disease of native coronary artery without angina pectoris: Secondary | ICD-10-CM | POA: Diagnosis not present

## 2018-06-03 DIAGNOSIS — I10 Essential (primary) hypertension: Secondary | ICD-10-CM | POA: Diagnosis not present

## 2018-06-03 DIAGNOSIS — I69320 Aphasia following cerebral infarction: Secondary | ICD-10-CM | POA: Diagnosis not present

## 2018-06-03 DIAGNOSIS — M810 Age-related osteoporosis without current pathological fracture: Secondary | ICD-10-CM | POA: Diagnosis not present

## 2018-06-03 DIAGNOSIS — I69398 Other sequelae of cerebral infarction: Secondary | ICD-10-CM | POA: Diagnosis not present

## 2018-06-03 DIAGNOSIS — F0390 Unspecified dementia without behavioral disturbance: Secondary | ICD-10-CM | POA: Diagnosis not present

## 2018-06-03 DIAGNOSIS — Z959 Presence of cardiac and vascular implant and graft, unspecified: Secondary | ICD-10-CM | POA: Diagnosis not present

## 2018-06-03 DIAGNOSIS — R262 Difficulty in walking, not elsewhere classified: Secondary | ICD-10-CM | POA: Diagnosis not present

## 2018-06-03 DIAGNOSIS — E119 Type 2 diabetes mellitus without complications: Secondary | ICD-10-CM | POA: Diagnosis not present

## 2018-06-03 DIAGNOSIS — Z7901 Long term (current) use of anticoagulants: Secondary | ICD-10-CM | POA: Diagnosis not present

## 2018-06-04 ENCOUNTER — Telehealth: Payer: Self-pay | Admitting: Internal Medicine

## 2018-06-04 DIAGNOSIS — E119 Type 2 diabetes mellitus without complications: Secondary | ICD-10-CM | POA: Diagnosis not present

## 2018-06-04 DIAGNOSIS — H35033 Hypertensive retinopathy, bilateral: Secondary | ICD-10-CM | POA: Diagnosis not present

## 2018-06-04 DIAGNOSIS — H40051 Ocular hypertension, right eye: Secondary | ICD-10-CM | POA: Diagnosis not present

## 2018-06-04 DIAGNOSIS — H401231 Low-tension glaucoma, bilateral, mild stage: Secondary | ICD-10-CM | POA: Diagnosis not present

## 2018-06-04 NOTE — Telephone Encounter (Signed)
Will you please contact [facility] to give verbal orders per MD

## 2018-06-04 NOTE — Telephone Encounter (Signed)
LVM with [facility] stating ok for verbal orders per provider. Asked her to call back with any questions or concerns if needed.

## 2018-06-04 NOTE — Telephone Encounter (Signed)
Copied from [place] [id number]. Topic: Quick Communication - See Telephone Encounter >> Jun 04, 2018  1:02 PM Ivar Drape wrote: CRM for notification. See Telephone encounter for: 06/04/18. Liji PT w/[facility] [phone] need verbal orders for 1w1, 2 times for 4 wks, once a week for two weeks to work on balance and strength.

## 2018-06-09 DIAGNOSIS — E119 Type 2 diabetes mellitus without complications: Secondary | ICD-10-CM | POA: Diagnosis not present

## 2018-06-09 DIAGNOSIS — I69398 Other sequelae of cerebral infarction: Secondary | ICD-10-CM | POA: Diagnosis not present

## 2018-06-09 DIAGNOSIS — F0390 Unspecified dementia without behavioral disturbance: Secondary | ICD-10-CM | POA: Diagnosis not present

## 2018-06-09 DIAGNOSIS — I69320 Aphasia following cerebral infarction: Secondary | ICD-10-CM | POA: Diagnosis not present

## 2018-06-09 DIAGNOSIS — M6281 Muscle weakness (generalized): Secondary | ICD-10-CM | POA: Diagnosis not present

## 2018-06-09 DIAGNOSIS — I2510 Atherosclerotic heart disease of native coronary artery without angina pectoris: Secondary | ICD-10-CM | POA: Diagnosis not present

## 2018-06-11 DIAGNOSIS — M6281 Muscle weakness (generalized): Secondary | ICD-10-CM | POA: Diagnosis not present

## 2018-06-11 DIAGNOSIS — F0390 Unspecified dementia without behavioral disturbance: Secondary | ICD-10-CM | POA: Diagnosis not present

## 2018-06-11 DIAGNOSIS — I69320 Aphasia following cerebral infarction: Secondary | ICD-10-CM | POA: Diagnosis not present

## 2018-06-11 DIAGNOSIS — E119 Type 2 diabetes mellitus without complications: Secondary | ICD-10-CM | POA: Diagnosis not present

## 2018-06-11 DIAGNOSIS — I69398 Other sequelae of cerebral infarction: Secondary | ICD-10-CM | POA: Diagnosis not present

## 2018-06-11 DIAGNOSIS — I2510 Atherosclerotic heart disease of native coronary artery without angina pectoris: Secondary | ICD-10-CM | POA: Diagnosis not present

## 2018-06-16 DIAGNOSIS — E119 Type 2 diabetes mellitus without complications: Secondary | ICD-10-CM | POA: Diagnosis not present

## 2018-06-16 DIAGNOSIS — F0390 Unspecified dementia without behavioral disturbance: Secondary | ICD-10-CM | POA: Diagnosis not present

## 2018-06-16 DIAGNOSIS — I2510 Atherosclerotic heart disease of native coronary artery without angina pectoris: Secondary | ICD-10-CM | POA: Diagnosis not present

## 2018-06-16 DIAGNOSIS — I69320 Aphasia following cerebral infarction: Secondary | ICD-10-CM | POA: Diagnosis not present

## 2018-06-16 DIAGNOSIS — M6281 Muscle weakness (generalized): Secondary | ICD-10-CM | POA: Diagnosis not present

## 2018-06-16 DIAGNOSIS — I69398 Other sequelae of cerebral infarction: Secondary | ICD-10-CM | POA: Diagnosis not present

## 2018-06-18 ENCOUNTER — Other Ambulatory Visit: Payer: Self-pay | Admitting: Internal Medicine

## 2018-06-18 DIAGNOSIS — M6281 Muscle weakness (generalized): Secondary | ICD-10-CM | POA: Diagnosis not present

## 2018-06-18 DIAGNOSIS — I69398 Other sequelae of cerebral infarction: Secondary | ICD-10-CM | POA: Diagnosis not present

## 2018-06-18 DIAGNOSIS — I69320 Aphasia following cerebral infarction: Secondary | ICD-10-CM | POA: Diagnosis not present

## 2018-06-18 DIAGNOSIS — I2510 Atherosclerotic heart disease of native coronary artery without angina pectoris: Secondary | ICD-10-CM | POA: Diagnosis not present

## 2018-06-18 DIAGNOSIS — F0390 Unspecified dementia without behavioral disturbance: Secondary | ICD-10-CM | POA: Diagnosis not present

## 2018-06-18 DIAGNOSIS — E119 Type 2 diabetes mellitus without complications: Secondary | ICD-10-CM | POA: Diagnosis not present

## 2018-06-22 DIAGNOSIS — E119 Type 2 diabetes mellitus without complications: Secondary | ICD-10-CM | POA: Diagnosis not present

## 2018-06-22 DIAGNOSIS — I69320 Aphasia following cerebral infarction: Secondary | ICD-10-CM | POA: Diagnosis not present

## 2018-06-22 DIAGNOSIS — M6281 Muscle weakness (generalized): Secondary | ICD-10-CM | POA: Diagnosis not present

## 2018-06-22 DIAGNOSIS — I2510 Atherosclerotic heart disease of native coronary artery without angina pectoris: Secondary | ICD-10-CM | POA: Diagnosis not present

## 2018-06-22 DIAGNOSIS — F0390 Unspecified dementia without behavioral disturbance: Secondary | ICD-10-CM | POA: Diagnosis not present

## 2018-06-22 DIAGNOSIS — I69398 Other sequelae of cerebral infarction: Secondary | ICD-10-CM | POA: Diagnosis not present

## 2018-06-24 DIAGNOSIS — E119 Type 2 diabetes mellitus without complications: Secondary | ICD-10-CM | POA: Diagnosis not present

## 2018-06-24 DIAGNOSIS — M6281 Muscle weakness (generalized): Secondary | ICD-10-CM | POA: Diagnosis not present

## 2018-06-24 DIAGNOSIS — I69320 Aphasia following cerebral infarction: Secondary | ICD-10-CM | POA: Diagnosis not present

## 2018-06-24 DIAGNOSIS — I69398 Other sequelae of cerebral infarction: Secondary | ICD-10-CM | POA: Diagnosis not present

## 2018-06-24 DIAGNOSIS — F0390 Unspecified dementia without behavioral disturbance: Secondary | ICD-10-CM | POA: Diagnosis not present

## 2018-06-24 DIAGNOSIS — I2510 Atherosclerotic heart disease of native coronary artery without angina pectoris: Secondary | ICD-10-CM | POA: Diagnosis not present

## 2018-06-25 DIAGNOSIS — M6281 Muscle weakness (generalized): Secondary | ICD-10-CM | POA: Diagnosis not present

## 2018-06-25 DIAGNOSIS — R262 Difficulty in walking, not elsewhere classified: Secondary | ICD-10-CM

## 2018-06-25 DIAGNOSIS — I69398 Other sequelae of cerebral infarction: Secondary | ICD-10-CM | POA: Diagnosis not present

## 2018-06-25 DIAGNOSIS — M810 Age-related osteoporosis without current pathological fracture: Secondary | ICD-10-CM

## 2018-06-25 DIAGNOSIS — I69320 Aphasia following cerebral infarction: Secondary | ICD-10-CM | POA: Diagnosis not present

## 2018-06-25 DIAGNOSIS — Z7901 Long term (current) use of anticoagulants: Secondary | ICD-10-CM | POA: Diagnosis not present

## 2018-06-25 DIAGNOSIS — E119 Type 2 diabetes mellitus without complications: Secondary | ICD-10-CM

## 2018-06-25 DIAGNOSIS — I10 Essential (primary) hypertension: Secondary | ICD-10-CM | POA: Diagnosis not present

## 2018-06-25 DIAGNOSIS — F0390 Unspecified dementia without behavioral disturbance: Secondary | ICD-10-CM | POA: Diagnosis not present

## 2018-06-25 DIAGNOSIS — I2510 Atherosclerotic heart disease of native coronary artery without angina pectoris: Secondary | ICD-10-CM

## 2018-06-25 DIAGNOSIS — Z7984 Long term (current) use of oral hypoglycemic drugs: Secondary | ICD-10-CM

## 2018-06-25 DIAGNOSIS — Z959 Presence of cardiac and vascular implant and graft, unspecified: Secondary | ICD-10-CM | POA: Diagnosis not present

## 2018-06-29 DIAGNOSIS — M6281 Muscle weakness (generalized): Secondary | ICD-10-CM | POA: Diagnosis not present

## 2018-06-29 DIAGNOSIS — I2510 Atherosclerotic heart disease of native coronary artery without angina pectoris: Secondary | ICD-10-CM | POA: Diagnosis not present

## 2018-06-29 DIAGNOSIS — I69320 Aphasia following cerebral infarction: Secondary | ICD-10-CM | POA: Diagnosis not present

## 2018-06-29 DIAGNOSIS — E119 Type 2 diabetes mellitus without complications: Secondary | ICD-10-CM | POA: Diagnosis not present

## 2018-06-29 DIAGNOSIS — I69398 Other sequelae of cerebral infarction: Secondary | ICD-10-CM | POA: Diagnosis not present

## 2018-06-29 DIAGNOSIS — F0390 Unspecified dementia without behavioral disturbance: Secondary | ICD-10-CM | POA: Diagnosis not present

## 2018-06-30 ENCOUNTER — Other Ambulatory Visit: Payer: Self-pay

## 2018-06-30 MED ORDER — IRBESARTAN 75 MG PO TABS: 75.0000 mg | ORAL_TABLET | Freq: Every day | ORAL | 1 refills | Status: DC

## 2018-07-01 DIAGNOSIS — I69320 Aphasia following cerebral infarction: Secondary | ICD-10-CM | POA: Diagnosis not present

## 2018-07-01 DIAGNOSIS — I69398 Other sequelae of cerebral infarction: Secondary | ICD-10-CM | POA: Diagnosis not present

## 2018-07-01 DIAGNOSIS — E119 Type 2 diabetes mellitus without complications: Secondary | ICD-10-CM | POA: Diagnosis not present

## 2018-07-01 DIAGNOSIS — M6281 Muscle weakness (generalized): Secondary | ICD-10-CM | POA: Diagnosis not present

## 2018-07-01 DIAGNOSIS — F0390 Unspecified dementia without behavioral disturbance: Secondary | ICD-10-CM | POA: Diagnosis not present

## 2018-07-01 DIAGNOSIS — I2510 Atherosclerotic heart disease of native coronary artery without angina pectoris: Secondary | ICD-10-CM | POA: Diagnosis not present

## 2018-07-06 ENCOUNTER — Telehealth: Payer: Self-pay | Admitting: Internal Medicine

## 2018-07-06 ENCOUNTER — Other Ambulatory Visit (INDEPENDENT_AMBULATORY_CARE_PROVIDER_SITE_OTHER): Payer: Medicare Other

## 2018-07-06 DIAGNOSIS — M6281 Muscle weakness (generalized): Secondary | ICD-10-CM | POA: Diagnosis not present

## 2018-07-06 DIAGNOSIS — E119 Type 2 diabetes mellitus without complications: Secondary | ICD-10-CM | POA: Diagnosis not present

## 2018-07-06 DIAGNOSIS — I2510 Atherosclerotic heart disease of native coronary artery without angina pectoris: Secondary | ICD-10-CM | POA: Diagnosis not present

## 2018-07-06 DIAGNOSIS — F0390 Unspecified dementia without behavioral disturbance: Secondary | ICD-10-CM | POA: Diagnosis not present

## 2018-07-06 DIAGNOSIS — E039 Hypothyroidism, unspecified: Secondary | ICD-10-CM

## 2018-07-06 DIAGNOSIS — I69320 Aphasia following cerebral infarction: Secondary | ICD-10-CM | POA: Diagnosis not present

## 2018-07-06 DIAGNOSIS — I69398 Other sequelae of cerebral infarction: Secondary | ICD-10-CM | POA: Diagnosis not present

## 2018-07-06 LAB — TSH: TSH: 6.53 u[IU]/mL — ABNORMAL HIGH (ref 0.35–4.50)

## 2018-07-06 NOTE — Telephone Encounter (Signed)
ok 

## 2018-07-06 NOTE — Telephone Encounter (Signed)
Gave verbal orders per provider.

## 2018-07-06 NOTE — Telephone Encounter (Signed)
Copied from [place] [phone]. Topic: Inquiry >> Jul 06, 2018  1:16 PM Scherrie Gerlach wrote: Reason for CRM: Shanon Brow with [facility] calling to get verbal for extension of home health PT orders to 2 wk 4

## 2018-07-07 MED ORDER — LEVOTHYROXINE SODIUM 50 MCG PO TABS: 50.0000 ug | ORAL_TABLET | Freq: Every day | ORAL | 1 refills | Status: DC

## 2018-07-08 DIAGNOSIS — M6281 Muscle weakness (generalized): Secondary | ICD-10-CM | POA: Diagnosis not present

## 2018-07-08 DIAGNOSIS — E119 Type 2 diabetes mellitus without complications: Secondary | ICD-10-CM | POA: Diagnosis not present

## 2018-07-08 DIAGNOSIS — I2510 Atherosclerotic heart disease of native coronary artery without angina pectoris: Secondary | ICD-10-CM | POA: Diagnosis not present

## 2018-07-08 DIAGNOSIS — F0390 Unspecified dementia without behavioral disturbance: Secondary | ICD-10-CM | POA: Diagnosis not present

## 2018-07-08 DIAGNOSIS — I69398 Other sequelae of cerebral infarction: Secondary | ICD-10-CM | POA: Diagnosis not present

## 2018-07-08 DIAGNOSIS — I69320 Aphasia following cerebral infarction: Secondary | ICD-10-CM | POA: Diagnosis not present

## 2018-07-14 DIAGNOSIS — I69398 Other sequelae of cerebral infarction: Secondary | ICD-10-CM | POA: Diagnosis not present

## 2018-07-14 DIAGNOSIS — I2510 Atherosclerotic heart disease of native coronary artery without angina pectoris: Secondary | ICD-10-CM | POA: Diagnosis not present

## 2018-07-14 DIAGNOSIS — E119 Type 2 diabetes mellitus without complications: Secondary | ICD-10-CM | POA: Diagnosis not present

## 2018-07-14 DIAGNOSIS — I69320 Aphasia following cerebral infarction: Secondary | ICD-10-CM | POA: Diagnosis not present

## 2018-07-14 DIAGNOSIS — M6281 Muscle weakness (generalized): Secondary | ICD-10-CM | POA: Diagnosis not present

## 2018-07-14 DIAGNOSIS — F0390 Unspecified dementia without behavioral disturbance: Secondary | ICD-10-CM | POA: Diagnosis not present

## 2018-07-16 DIAGNOSIS — I69320 Aphasia following cerebral infarction: Secondary | ICD-10-CM | POA: Diagnosis not present

## 2018-07-16 DIAGNOSIS — I69398 Other sequelae of cerebral infarction: Secondary | ICD-10-CM | POA: Diagnosis not present

## 2018-07-16 DIAGNOSIS — M6281 Muscle weakness (generalized): Secondary | ICD-10-CM | POA: Diagnosis not present

## 2018-07-16 DIAGNOSIS — F0390 Unspecified dementia without behavioral disturbance: Secondary | ICD-10-CM | POA: Diagnosis not present

## 2018-07-16 DIAGNOSIS — E119 Type 2 diabetes mellitus without complications: Secondary | ICD-10-CM | POA: Diagnosis not present

## 2018-07-16 DIAGNOSIS — I2510 Atherosclerotic heart disease of native coronary artery without angina pectoris: Secondary | ICD-10-CM | POA: Diagnosis not present

## 2018-07-21 DIAGNOSIS — E119 Type 2 diabetes mellitus without complications: Secondary | ICD-10-CM | POA: Diagnosis not present

## 2018-07-21 DIAGNOSIS — F0390 Unspecified dementia without behavioral disturbance: Secondary | ICD-10-CM | POA: Diagnosis not present

## 2018-07-21 DIAGNOSIS — I69398 Other sequelae of cerebral infarction: Secondary | ICD-10-CM | POA: Diagnosis not present

## 2018-07-21 DIAGNOSIS — M6281 Muscle weakness (generalized): Secondary | ICD-10-CM | POA: Diagnosis not present

## 2018-07-21 DIAGNOSIS — I2510 Atherosclerotic heart disease of native coronary artery without angina pectoris: Secondary | ICD-10-CM | POA: Diagnosis not present

## 2018-07-21 DIAGNOSIS — I69320 Aphasia following cerebral infarction: Secondary | ICD-10-CM | POA: Diagnosis not present

## 2018-07-24 DIAGNOSIS — F0390 Unspecified dementia without behavioral disturbance: Secondary | ICD-10-CM | POA: Diagnosis not present

## 2018-07-24 DIAGNOSIS — I69320 Aphasia following cerebral infarction: Secondary | ICD-10-CM | POA: Diagnosis not present

## 2018-07-24 DIAGNOSIS — I2510 Atherosclerotic heart disease of native coronary artery without angina pectoris: Secondary | ICD-10-CM | POA: Diagnosis not present

## 2018-07-24 DIAGNOSIS — M6281 Muscle weakness (generalized): Secondary | ICD-10-CM | POA: Diagnosis not present

## 2018-07-24 DIAGNOSIS — I69398 Other sequelae of cerebral infarction: Secondary | ICD-10-CM | POA: Diagnosis not present

## 2018-07-24 DIAGNOSIS — E119 Type 2 diabetes mellitus without complications: Secondary | ICD-10-CM | POA: Diagnosis not present

## 2018-07-28 DIAGNOSIS — M6281 Muscle weakness (generalized): Secondary | ICD-10-CM | POA: Diagnosis not present

## 2018-07-28 DIAGNOSIS — I69320 Aphasia following cerebral infarction: Secondary | ICD-10-CM | POA: Diagnosis not present

## 2018-07-28 DIAGNOSIS — E119 Type 2 diabetes mellitus without complications: Secondary | ICD-10-CM | POA: Diagnosis not present

## 2018-07-28 DIAGNOSIS — I2510 Atherosclerotic heart disease of native coronary artery without angina pectoris: Secondary | ICD-10-CM | POA: Diagnosis not present

## 2018-07-28 DIAGNOSIS — I69398 Other sequelae of cerebral infarction: Secondary | ICD-10-CM | POA: Diagnosis not present

## 2018-07-28 DIAGNOSIS — F0390 Unspecified dementia without behavioral disturbance: Secondary | ICD-10-CM | POA: Diagnosis not present

## 2018-07-30 DIAGNOSIS — M6281 Muscle weakness (generalized): Secondary | ICD-10-CM | POA: Diagnosis not present

## 2018-07-30 DIAGNOSIS — I69398 Other sequelae of cerebral infarction: Secondary | ICD-10-CM | POA: Diagnosis not present

## 2018-07-30 DIAGNOSIS — I2510 Atherosclerotic heart disease of native coronary artery without angina pectoris: Secondary | ICD-10-CM | POA: Diagnosis not present

## 2018-07-30 DIAGNOSIS — E119 Type 2 diabetes mellitus without complications: Secondary | ICD-10-CM | POA: Diagnosis not present

## 2018-07-30 DIAGNOSIS — I69320 Aphasia following cerebral infarction: Secondary | ICD-10-CM | POA: Diagnosis not present

## 2018-07-30 DIAGNOSIS — F0390 Unspecified dementia without behavioral disturbance: Secondary | ICD-10-CM | POA: Diagnosis not present

## 2018-07-31 ENCOUNTER — Encounter: Payer: Self-pay | Admitting: Podiatry

## 2018-07-31 ENCOUNTER — Ambulatory Visit (INDEPENDENT_AMBULATORY_CARE_PROVIDER_SITE_OTHER): Payer: Medicare Other | Admitting: Podiatry

## 2018-07-31 DIAGNOSIS — B351 Tinea unguium: Secondary | ICD-10-CM | POA: Diagnosis not present

## 2018-07-31 DIAGNOSIS — D689 Coagulation defect, unspecified: Secondary | ICD-10-CM

## 2018-07-31 DIAGNOSIS — E119 Type 2 diabetes mellitus without complications: Secondary | ICD-10-CM

## 2018-07-31 DIAGNOSIS — M79674 Pain in right toe(s): Secondary | ICD-10-CM

## 2018-07-31 DIAGNOSIS — M79675 Pain in left toe(s): Secondary | ICD-10-CM

## 2018-07-31 NOTE — Progress Notes (Signed)
Complaint:  Visit Type: Patient returns to my office for continued preventative foot care services. Complaint: Patient states" my nails have grown long and thick and become painful to walk and wear shoes" Patient has been diagnosed with DM with no foot complications. The patient presents for preventative foot care services. No changes to ROS  Podiatric Exam: Vascular: dorsalis pedis and posterior tibial pulses are palpable bilateral. Capillary return is immediate. Temperature gradient is WNL. Skin turgor WNL  Sensorium: Normal Semmes Weinstein monofilament test. Normal tactile sensation bilaterally. Nail Exam: Pt has thick disfigured discolored nails with subungual debris noted bilateral entire nail hallux through fifth toenails Ulcer Exam: There is no evidence of ulcer or pre-ulcerative changes or infection. Orthopedic Exam: Muscle tone and strength are WNL. No limitations in general ROM. No crepitus or effusions noted. Foot type and digits show no abnormalities. Bony prominences are unremarkable. Skin: No Porokeratosis. No infection or ulcers  Diagnosis:  Onychomycosis, , Pain in right toe, pain in left toes  Treatment & Plan Procedures and Treatment: Consent by patient was obtained for treatment procedures.   Debridement of mycotic and hypertrophic toenails, 1 through 5 bilateral and clearing of subungual debris. No ulceration, no infection noted.  Return Visit-Office Procedure: Patient instructed to return to the office for a follow up visit 3 months for continued evaluation and treatment.    Jennings Corado DPM 

## 2018-08-04 ENCOUNTER — Other Ambulatory Visit: Payer: Self-pay | Admitting: Internal Medicine

## 2018-08-04 MED ORDER — LEVOTHYROXINE SODIUM 50 MCG PO TABS: 50.0000 ug | ORAL_TABLET | Freq: Every day | ORAL | 1 refills | Status: DC

## 2018-08-04 NOTE — Telephone Encounter (Signed)
Copied from [place] [id number]. Topic: Quick Communication - Rx Refill/Question >> Aug 04, 2018  8:45 AM [name] wrote: Medication: levothyroxine (SYNTHROID, LEVOTHROID) 50 MCG tablet [[id number]]   Has the patient contacted their pharmacy? Yes.   (Agent: If no, request that the patient contact the pharmacy for the refill.) (Agent: If yes, when and what did the pharmacy advise?)  Preferred Pharm[facility] (with phone number [facility] street name[facility]  Agent: Please be advised that RX refills may take up to 3 business days. We ask that you follow-up with your pharmacy.

## 2018-08-04 NOTE — Telephone Encounter (Signed)
levothyroxine refill Last Refill:07/07/18 # 90 1 RF Last OV: 07/07/18 addressed in result note PCP: Dr Quay Burow Pharmacy:[facility] [place] Last TSH: 6.53 on 07/06/18

## 2018-08-06 ENCOUNTER — Ambulatory Visit (INDEPENDENT_AMBULATORY_CARE_PROVIDER_SITE_OTHER): Payer: Medicare Other

## 2018-08-06 DIAGNOSIS — Z23 Encounter for immunization: Secondary | ICD-10-CM | POA: Diagnosis not present

## 2018-08-13 ENCOUNTER — Other Ambulatory Visit (INDEPENDENT_AMBULATORY_CARE_PROVIDER_SITE_OTHER): Payer: Medicare Other

## 2018-08-13 DIAGNOSIS — E039 Hypothyroidism, unspecified: Secondary | ICD-10-CM | POA: Diagnosis not present

## 2018-08-13 LAB — TSH: TSH: 1.13 u[IU]/mL (ref 0.35–4.50)

## 2018-08-17 IMAGING — DX DG ANKLE COMPLETE 3+V*L*
3 series · 3 of 3 positions shown · non-contrast
Comparison: None.

CLINICAL DATA: Fall 2 weeks ago

EXAM:
LEFT ANKLE COMPLETE - 3+ VIEW

[ankle ap]
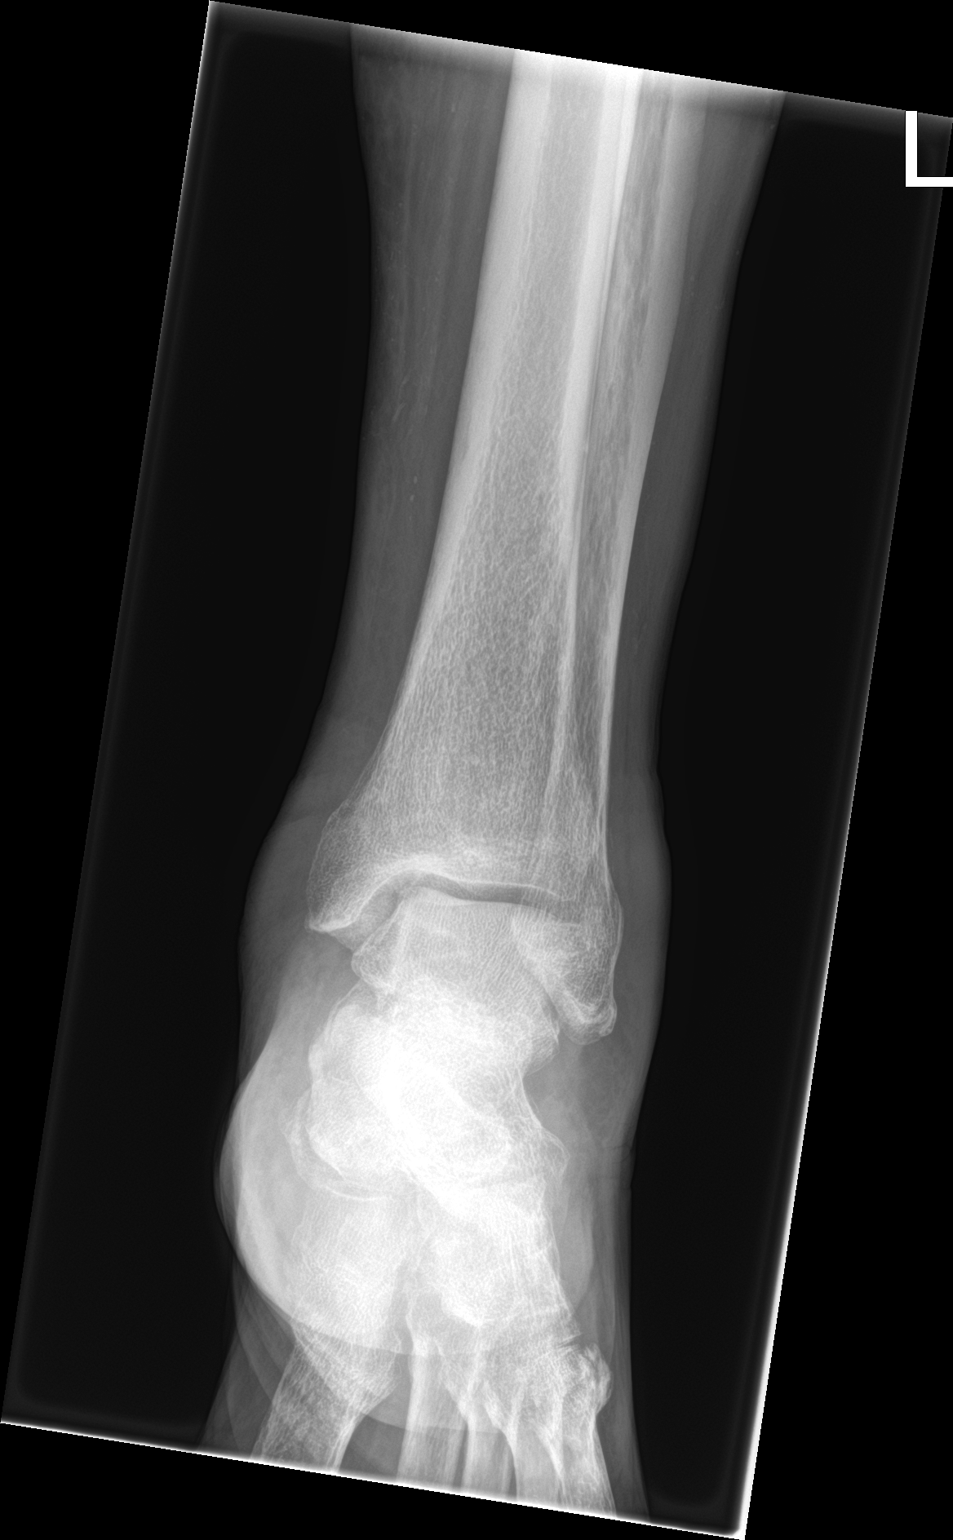

[ankle obl]
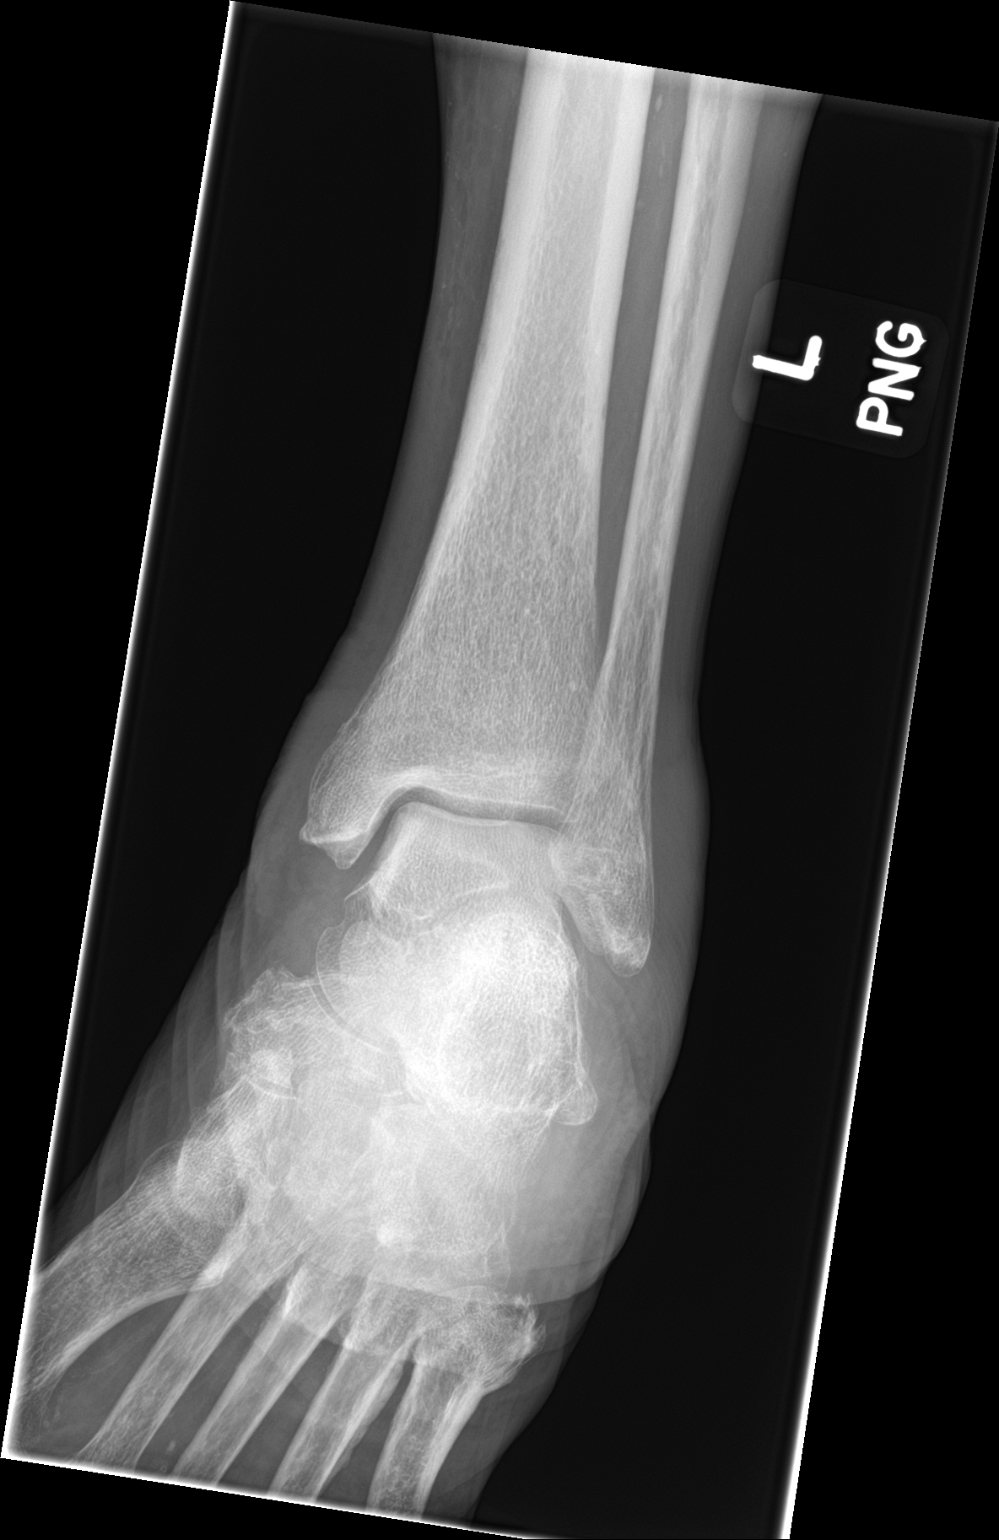

[ankle lat]
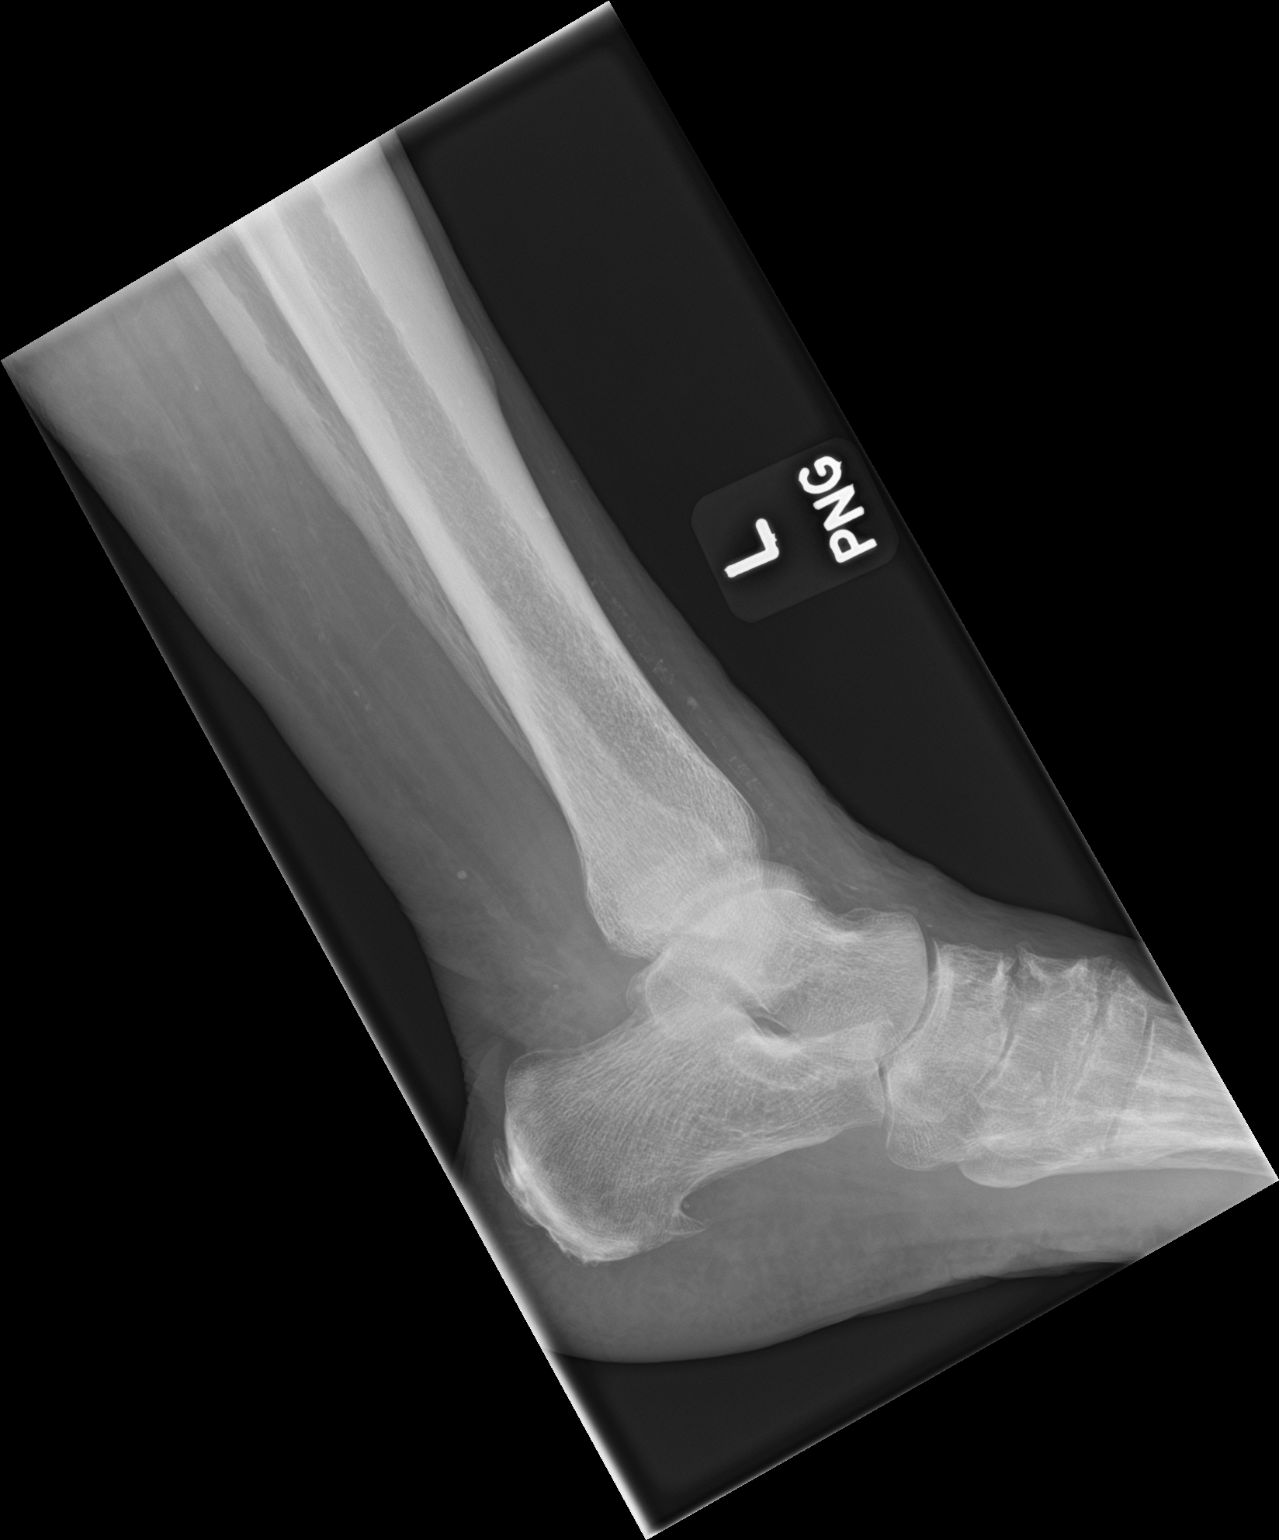

[3 of 3 positions shown; findings below may reference images not displayed]

FINDINGS: No fracture or dislocation is seen.

The ankle mortise is intact.

Degenerative changes the dorsal midfoot.

Small plantar and posterior calcaneal enthesophytes.

Mild diffuse soft tissue swelling.
IMPRESSION: Negative.

## 2018-09-13 ENCOUNTER — Other Ambulatory Visit: Payer: Self-pay | Admitting: Cardiovascular Disease

## 2018-10-09 ENCOUNTER — Other Ambulatory Visit (INDEPENDENT_AMBULATORY_CARE_PROVIDER_SITE_OTHER): Payer: Medicare Other

## 2018-10-09 ENCOUNTER — Ambulatory Visit (INDEPENDENT_AMBULATORY_CARE_PROVIDER_SITE_OTHER): Payer: Medicare Other | Admitting: Internal Medicine

## 2018-10-09 ENCOUNTER — Encounter: Payer: Self-pay | Admitting: Internal Medicine

## 2018-10-09 VITALS — BP 210/94 | HR 74 | Temp 97.7°F | Resp 16

## 2018-10-09 DIAGNOSIS — I10 Essential (primary) hypertension: Secondary | ICD-10-CM

## 2018-10-09 DIAGNOSIS — R300 Dysuria: Secondary | ICD-10-CM

## 2018-10-09 DIAGNOSIS — I2510 Atherosclerotic heart disease of native coronary artery without angina pectoris: Secondary | ICD-10-CM | POA: Diagnosis not present

## 2018-10-09 DIAGNOSIS — R102 Pelvic and perineal pain: Secondary | ICD-10-CM | POA: Diagnosis not present

## 2018-10-09 LAB — URINALYSIS, ROUTINE W REFLEX MICROSCOPIC
Bilirubin Urine: NEGATIVE
Ketones, ur: NEGATIVE
Leukocytes, UA: NEGATIVE
Nitrite: NEGATIVE
Specific Gravity, Urine: <=1.005 — AB (ref 1.000–1.030)
Total Protein, Urine: NEGATIVE
Urine Glucose: NEGATIVE
Urobilinogen, UA: 0.2 (ref 0.0–1.0)
pH: 6 (ref 5.0–8.0)

## 2018-10-09 LAB — POCT URINALYSIS DIPSTICK
Bilirubin, UA: NEGATIVE
Blood, UA: 10
Glucose, UA: NEGATIVE
Ketones, UA: NEGATIVE
Leukocytes, UA: NEGATIVE
Nitrite, UA: NEGATIVE
Protein, UA: POSITIVE — AB
Spec Grav, UA: 1.015 (ref 1.010–1.025)
Urobilinogen, UA: 0.2 E.U./dL
pH, UA: 6 (ref 5.0–8.0)

## 2018-10-09 MED ORDER — IRBESARTAN 150 MG PO TABS: 150.0000 mg | ORAL_TABLET | Freq: Every day | ORAL | 1 refills | Status: DC

## 2018-10-09 NOTE — Patient Instructions (Addendum)
  We will call with the urine culture results.  We will hold off on any medication for now unless the culture is positive for an infection.   If your pain continues we will need to evaluate further.    Medications reviewed and updated.  Changes include :   Increase irbesartan to 150 mg daily.  Your prescription(s) have been submitted to your pharmacy. Please take as directed and contact our office if you believe you are having problem(s) with the medication(s).    Please followup in January as scheduled

## 2018-10-09 NOTE — Progress Notes (Signed)
Subjective:    Patient ID: [name], female    DOB: Jun 15, 1925, 84 y.o.   MRN: [id number]  HPI The patient is here for an acute visit. She is here with her daughter.   She started having urinary symptoms about one week ago.  She is having suprapubic pain, back pain and darker urine.   She has rare dysuria.  She denies fever, chills, nausea, urinary frequency, odor to her urine and urinary hematuria.  Her daughter denies confusion.    Hypertension: She is taking her medication daily. She is compliant with a low sodium diet.   Her family does monitor her blood pressure at home and it has been elevated - typically in the 150's/?. .     Medications and allergies reviewed with patient and updated if appropriate.  Patient Active Problem List   Diagnosis Date Noted  . Hypothyroidism 05/29/2018  . Poor balance 05/26/2018  . Physical deconditioning 05/26/2018  . Generalized weakness 02/23/2018  . Skin tear of right lower leg without complication 46/56/8127  . Acute cystitis without hematuria 10/22/2017  . Left ankle pain 10/03/2017  . Moderate dementia with behavioral disturbance ([place]) 03/21/2017  . Cerebral infarction ([place]) 12/27/2015  . Convulsions ([place]) 09/11/2015  . SVT (supraventricular tachycardia) ([place])   . Fall 08/21/2015  . PAF (paroxysmal atrial fibrillation) ([place])   . Stroke (cerebrum) ([place]) 08/20/2015  . Aphasia   . TIA (transient ischemic attack)   . FTT (failure to thrive) in adult   . Syncope 10/31/2014  . Diabetes mellitus type 2, controlled ([place]) 10/31/2014  . Uncontrolled hypertension 10/31/2014  . Incomplete emptying of bladder 05/17/2014  . Headache 11/26/2013  . Atypical chest pain 06/29/2013  . Urinary tract infection without hematuria 08/20/2011  . CAD, NATIVE VESSEL 02/14/2010  . GERD 02/14/2010  . ANEMIA-B12 DEFICIENCY 01/01/2010  . OSTEOARTHRITIS 01/01/2010  . PERSONAL HX COLONIC POLYPS 01/01/2010  . Iron deficiency anemia 12/28/2009  .  DIZZINESS AND GIDDINESS 12/28/2009  . Lower abdominal pain 09/13/2009  . Cough 12/13/2008  . Chronic lower back pain 08/11/2008  . KNEE PAIN, RIGHT 02/11/2008  . Dyslipidemia 04/23/2007  . Allergic rhinitis 04/23/2007  . GALLBLADDER DISEASE 04/23/2007  . DIVERTICULITIS, HX OF 04/23/2007    Current Outpatient Medications on File Prior to Visit  Medication Sig Dispense Refill  . acetaminophen (TYLENOL) 325 MG tablet Take 650 mg by mouth every 6 (six) hours as needed for mild pain.    [name] amLODipine (NORVASC) 5 MG tablet TAKE 1 TABLET BY MOUTH DAILY PLEASE KEEP UPCOMING APPT IN FEBRUARY FOR FUTURE REFILLS. 90 tablet 3  . atorvastatin (LIPITOR) 40 MG tablet Take 1 tablet daily 90 tablet 3  . ciprofloxacin (CIPRO) 250 MG tablet ciprofloxacin 250 mg tablet    . divalproex (DEPAKOTE ER) 250 MG 24 hr tablet Take 1 tablet at night for a week, then increase to 1 tablet twice a day 60 tablet 11  . donepezil (ARICEPT) 5 MG tablet donepezil 5 mg tablet    . ELIQUIS 2.5 MG TABS tablet TAKE 1 TABLET BY MOUTH TWICE A DAY 180 tablet 1  . fluticasone (FLONASE) 50 MCG/ACT nasal spray fluticasone propionate 50 mcg/actuation nasal spray,suspension    . glucose blood (ONE TOUCH ULTRA TEST) test strip USE 1 STRIP TO CHECK GLUCOSE ONCE DAILY DX CODE E11.9. 100 each 3  . irbesartan (AVAPRO) 75 MG tablet Take 1 tablet (75 mg total) by mouth daily. 90 tablet 1  . iron polysaccharides (FERREX 150) 150  MG capsule Ferrex 150 mg iron capsule  TAKE ONE CAPSULE BY MOUTH TWICE A DAY    . Lancets (ONETOUCH ULTRASOFT) lancets USE 1  TO CHECK GLUCOSE ONCE DAILY 100 each 1  . latanoprost (XALATAN) 0.005 % ophthalmic solution latanoprost 0.005 % eye drops  PLACE 1 DROP IN BOTH EYES NIGHTLY    . levofloxacin (LEVAQUIN) 500 MG tablet levofloxacin 500 mg tablet    . levothyroxine (SYNTHROID, LEVOTHROID) 50 MCG tablet Take 1 tablet (50 mcg total) by mouth daily. 90 tablet 1  . memantine (NAMENDA) 10 MG tablet TAKE 1 TABLET BY  MOUTH TWICE A DAY 180 tablet 4  . metFORMIN (GLUCOPHAGE) 500 MG tablet Take 1 tablet in AM with meal, Take 2 tablets in PM with meal. 270 tablet 1  . metoprolol succinate (TOPROL-XL) 50 MG 24 hr tablet TAKE 1 TABLET BY MOUTH DAILY. TAKE WITH OR IMMEDIATELY FOLLOWING A MEAL. 90 tablet 2  . nateglinide (STARLIX) 60 MG tablet nateglinide 60 mg tablet    . nitroGLYCERIN (NITROSTAT) 0.4 MG SL tablet Place 0.4 mg under the tongue every 5 (five) minutes as needed for chest pain. Reported on 12/21/2015    . ONETOUCH DELICA LANCETS 40N MISC Use 1 lancet to check glucose once daily 100 each 1  . OVER THE COUNTER MEDICATION Place 1 patch onto the skin daily as needed (pain). Arthritis pain reliever patch    . pantoprazole (PROTONIX) 20 MG tablet Take 1 tablet (20 mg total) by mouth daily. 90 tablet 3  . phenazopyridine (PYRIDIUM) 100 MG tablet phenazopyridine 100 mg tablet    . potassium chloride (KLOR-CON 10) 10 MEQ tablet Take 1 tablet (10 mEq total) by mouth daily. 90 tablet 3  . sitaGLIPtin (JANUVIA) 100 MG tablet Januvia 100 mg tablet     No current facility-administered medications on file prior to visit.     Past Medical History:  Diagnosis Date  . Abdominal pain   . Allergic rhinitis, cause unspecified   . Anemia B twelve deficiency   . Arrhythmia    paroxysmal atrial fibrillation  . Arthritis    "arms and hands" (06/30/2013)  . Benign neoplasm of colon   . Colon polyp   . Coronary artery disease   . Degeneration of lumbar or lumbosacral intervertebral disc   . Dizziness - giddy   . Edema   . GERD (gastroesophageal reflux disease)   . Hyperlipidemia   . Hypertension   . Iron deficiency anemia, unspecified   . Lumbago   . Pernicious anemia   . Seizures ([place])   . Stroke ([place])   . Symptomatic menopausal or female climacteric states   . Thoracic or lumbosacral neuritis or radiculitis, unspecified   . Type II diabetes mellitus ([place])     Past Surgical History:  Procedure Laterality  Date  . ABDOMINAL HYSTERECTOMY  1974  . APPENDECTOMY  1974  . CARDIAC CATHETERIZATION    . CARPAL TUNNEL RELEASE Bilateral 1970's  . CATARACT EXTRACTION, BILATERAL Bilateral   . CHOLECYSTECTOMY    . CORONARY ANGIOPLASTY WITH STENT PLACEMENT     "1" (06/30/2013  . LUMBAR DISC SURGERY      Social History   Socioeconomic History  . Marital status: Widowed    Spouse name: Not on file  . Number of children: 8  . Years of education: 10th Grade  . Highest education level: Not on file  Occupational History  . Not on file  Social Needs  . Financial resource strain: Not on  file  . Food insecurity:    Worry: Not on file    Inability: Not on file  . Transportation needs:    Medical: Not on file    Non-medical: Not on file  Tobacco Use  . Smoking status: Never Smoker  . Smokeless tobacco: Current User    Types: Snuff  Substance and Sexual Activity  . Alcohol use: No    Alcohol/week: 0.0 standard drinks  . Drug use: No  . Sexual activity: Never  Lifestyle  . Physical activity:    Days per week: Not on file    Minutes per session: Not on file  . Stress: Not on file  Relationships  . Social connections:    Talks on phone: Not on file    Gets together: Not on file    Attends religious service: Not on file    Active member of club or organization: Not on file    Attends meetings of clubs or organizations: Not on file    Relationship status: Not on file  Other Topics Concern  . Not on file  Social History Narrative   Patient lives with daughter in a two story home.   Patient has a 10th grade education.   Patient is retired.   Patient is right handed.    Family History  Problem Relation Age of Onset  . Breast cancer Sister        "behind heart"  . Colon cancer Sister   . Colon cancer Sister   . Breast cancer Daughter   . Breast cancer Daughter   . Breast cancer Daughter   . Kidney cancer Daughter     Review of Systems  Constitutional: Negative for appetite change,  chills and fever.  Gastrointestinal: Positive for abdominal pain (suprapubic pain). Negative for nausea.  Genitourinary: Positive for dysuria (seldom). Negative for frequency and hematuria.       Urine is darker, no odor to urine  Musculoskeletal: Positive for back pain (chronic).  Psychiatric/Behavioral: Negative for confusion.       Objective:   Vitals:   10/09/18 1043  BP: (!) 210/94  Pulse: 74  Resp: 16  Temp: 97.7 F (36.5 C)  SpO2: 97%   BP Readings from Last 3 Encounters:  10/09/18 (!) 210/94  05/26/18 (!) 168/72  03/23/18 (!) 146/64   Wt Readings from Last 3 Encounters:  05/26/18 117 lb (53.1 kg)  03/23/18 109 lb (49.4 kg)  02/23/18 110 lb (49.9 kg)   There is no height or weight on file to calculate BMI.   Physical Exam  Constitutional: She appears well-developed and well-nourished. No distress.  HENT:  Head: Normocephalic and atraumatic.  Cardiovascular: Normal rate and regular rhythm.  Pulmonary/Chest: Effort normal and breath sounds normal. No respiratory distress. She has no wheezes. She has no rales.  Abdominal: Soft. She exhibits no mass. There is tenderness (mild across lower abdomen). There is no rebound and no guarding.  Musculoskeletal: She exhibits no edema.  Skin: Skin is warm and dry. She is not diaphoretic.          Assessment & Plan:    See Problem List for Assessment and Plan of chronic medical problems.

## 2018-10-10 NOTE — Assessment & Plan Note (Signed)
Suprapubic pain associated with rare dysuria, dark urine ? UTI Urine dip not convincing of a UTI Will send for culture and wait for culture results before treating If pain persists and urine culture is negative may need to pursue imaging of lower abdomen

## 2018-10-10 NOTE — Assessment & Plan Note (Signed)
Rare dysuria ? UTI Urine dip not convincing of a UTI Await culture results

## 2018-10-10 NOTE — Assessment & Plan Note (Signed)
BP not ideally controlled Has been elevated at home as well Increase avapro to 140 mg daily monitor at home - discussed goal BP

## 2018-10-12 ENCOUNTER — Encounter: Payer: Self-pay | Admitting: Neurology

## 2018-10-12 ENCOUNTER — Other Ambulatory Visit: Payer: Self-pay

## 2018-10-12 ENCOUNTER — Ambulatory Visit (INDEPENDENT_AMBULATORY_CARE_PROVIDER_SITE_OTHER): Payer: Medicare Other | Admitting: Neurology

## 2018-10-12 VITALS — BP 148/64 | HR 76 | Ht 61.25 in | Wt 121.0 lb

## 2018-10-12 DIAGNOSIS — F03B18 Unspecified dementia, moderate, with other behavioral disturbance: Secondary | ICD-10-CM

## 2018-10-12 DIAGNOSIS — F0391 Unspecified dementia with behavioral disturbance: Secondary | ICD-10-CM | POA: Diagnosis not present

## 2018-10-12 DIAGNOSIS — I2510 Atherosclerotic heart disease of native coronary artery without angina pectoris: Secondary | ICD-10-CM

## 2018-10-12 MED ORDER — DIVALPROEX SODIUM ER 250 MG PO TB24: ORAL_TABLET | ORAL | 3 refills | Status: DC

## 2018-10-12 MED ORDER — MEMANTINE HCL 10 MG PO TABS: 10.0000 mg | ORAL_TABLET | Freq: Two times a day (BID) | ORAL | 3 refills | Status: DC

## 2018-10-12 NOTE — Progress Notes (Signed)
NEUROLOGY FOLLOW UP OFFICE NOTE  [name] [id number]  DOB: 1925/02/02  HISTORY OF PRESENT ILLNESS: I had the pleasure of seeing [name] in follow-up in the neurology clinic on 10/12/2018.  The patient was last seen 7 months ago for moderate dementia with behavioral disturbance. She is again accompanied by her daughter and granddaughter who help supplement the history today. MMSE in May 2019 was 17/30. She is on Memantine 10mg  BID (had diarrhea on Aricept). Depakote was started on last visit for agitation and paranoia. She is taking Depakote ER 250mg  BID without side effects, her daughter wonders if it should be increased since she is having extreme paranoia. She "goes from 0 to 10 over just anything." She thinks someone is always breaking in at night. They deny any visual or auditory hallucinations. Sleep is somewhat better, but some times family staying with her only get a few hours of sleep at night. No wandering behavior. She is mostly wheelchair-bound, needing 2-person assist for transfers. She walks hunched down per family, she had home PT for strengthening which did not help much. She has 24/7 supervision at home and is able to feed herself but needs assistance with dressing and bathing. Memory decline continues to progress, she has difficulties with short-term memory such as dates, but has good long-term memory. She has reported headaches a couple of times over the vertex, no associated nausea/vomiting. She has baseline left arm weakness that is unchanged. She denies any dizziness, focal numbness/tingling/weakness, no falls. Appetite has increased a lot per daughter. No further episodes of loss of consciousness since June 2017.  HPI: This is a pleasant 84 yo RH woman with a history of diabetes, hypertension, CAD, paroxysmal atrial fibrillation, dementia, with a 5-6 year history of recurrent episodes of loss of consciousness and headaches. Prior to hospital admission in August  2016, she had a total of 5 or 6 of these episodes. Once or twice, her daughter has seen her stare, then go out. It appeared she would just go to sleep. She had urinary incontinence at least twice with these. No convulsive activity noted. Episodes would last 5-10 minutes, then she would come to a little confused. She had an episode in January 2015, the the next episode occurred in August 2016. She was brought to the ER on 07/11/15 for severe retro-orbital headaches, BP was noted to be 195/79. She was discharged home with improvement in headache and BP. She was brought back by family the next day for aphasia lasting 30-35 minutes. She was attempting to eat breakfast when she started "babbling" and appeared more confused. The cup she was holding was shaking and beginning to spill. This lasted a few minutes, and as she was improving, she said "why am I talking like this?" No focal weakness noted by family, she was arguing about what she wanted to wear when EMS arrived. She has no recollection of this. Per records, symptoms resolved upon EMS arrival. She was admitted for stroke workup. CBC, BMP were unremarkable except for glucose of 206. I personally reviewed MRI brain without contrast which did not show acute infarct, there was generalized atrophy and hydrocephalus ex vacuo, moderately advanced chronic microvascular disease. MRA showed intracranial atherosclerosis, no significant stenosis. Echo showed EF of 02-58%, grade 1 diastolic dysfunction, normal left atrium. On her third hospital day, she had an episode while sitting on a chair, when her whole body became stiff and she was unresponsive for 1-2 minutes. She was lowered to the ground and revived  within a couple of sternal rubs. Vital signs were normal. Head CT was unremarkable. On further questioning, family reported previous episodes of transient loss of consciousness where she would suddenly collapse with loss of bladder control. Her wake and drowsy EEG was  normal. She was very confused during her hospital stay, "she tried to escape," and was started on Seroquel. She was discharged home back to baseline per family. She was brought back to the ER on 07/22/15 for slurred speech and lethargy, felt to be due to Seroquel, this has since been discontinued with no further similar episodes.   Her 24-hour ambulatory EEG in October 2016 did not show any epileptiform discharges, there was occasional focal slowing over the left temporal region. She was admitted to [facility] for a right subcortical stroke on 08/20/15. She started having slurred speech on 08/18/15 and was brought to the ER the next day where she was evaluated by Neurology with note of normal neurological exam, recent TIA workup, head CT unremarkable. She was discharged home then returned the next day due to weakness, inability to walk, and fall. Family felt left side was weaker. MRI brain showed an acute stroke in the posterior right lentiform nucleus extending superiorly into the centrum semiovale. MRA showed intracranial stenosis. She had some worsening of symptoms in the hospital, and after discussion with family, it was decided to start Eliquis. Her BP medications were adjusted for permissive hypertension.  She feels her memory is pretty good. She lives with her daughter. Family started to notice memory changes several years ago, they administer her medications. They deny any hallucinations. She does not drive. She is taking Aricept 5mg  daily with no side effects. She denies any olfactory/gustatory hallucinations, deja vu, rising epigastric sensation, focal numbness/tingling/weakness, myoclonic jerks. There is a family history of seizures in her older sister, cousin, and grandson. Otherwise, she had a normal birth and early development. There is no history of febrile convulsions, CNS infections such as meningitis/encephalitis, significant traumatic brain injury, neurosurgical procedures.   PAST MEDICAL  HISTORY: Past Medical History:  Diagnosis Date  . Abdominal pain   . Allergic rhinitis, cause unspecified   . Anemia B twelve deficiency   . Arrhythmia    paroxysmal atrial fibrillation  . Arthritis    "arms and hands" (06/30/2013)  . Benign neoplasm of colon   . Colon polyp   . Coronary artery disease   . Degeneration of lumbar or lumbosacral intervertebral disc   . Dizziness - giddy   . Edema   . GERD (gastroesophageal reflux disease)   . Hyperlipidemia   . Hypertension   . Iron deficiency anemia, unspecified   . Lumbago   . Pernicious anemia   . Seizures ([place])   . Stroke ([place])   . Symptomatic menopausal or female climacteric states   . Thoracic or lumbosacral neuritis or radiculitis, unspecified   . Type II diabetes mellitus (HCC)     MEDICATIONS: Current Outpatient Medications on File Prior to Visit  Medication Sig Dispense Refill  . acetaminophen (TYLENOL) 325 MG tablet Take 650 mg by mouth every 6 (six) hours as needed for mild pain.    [name] amLODipine (NORVASC) 5 MG tablet TAKE 1 TABLET BY MOUTH DAILY PLEASE KEEP UPCOMING APPT IN FEBRUARY FOR FUTURE REFILLS. 90 tablet 3  . atorvastatin (LIPITOR) 40 MG tablet Take 1 tablet daily 90 tablet 3  . ciprofloxacin (CIPRO) 250 MG tablet ciprofloxacin 250 mg tablet    . divalproex (DEPAKOTE ER) 250 MG 24 hr  tablet Take 1 tablet at night for a week, then increase to 1 tablet twice a day 60 tablet 11  . donepezil (ARICEPT) 5 MG tablet donepezil 5 mg tablet    . ELIQUIS 2.5 MG TABS tablet TAKE 1 TABLET BY MOUTH TWICE A DAY 180 tablet 1  . fluticasone (FLONASE) 50 MCG/ACT nasal spray fluticasone propionate 50 mcg/actuation nasal spray,suspension    . glucose blood (ONE TOUCH ULTRA TEST) test strip USE 1 STRIP TO CHECK GLUCOSE ONCE DAILY DX CODE E11.9. 100 each 3  . irbesartan (AVAPRO) 150 MG tablet Take 1 tablet (150 mg total) by mouth daily. 90 tablet 1  . iron polysaccharides (FERREX 150) 150 MG capsule Ferrex 150 mg iron capsule   TAKE ONE CAPSULE BY MOUTH TWICE A DAY    . Lancets (ONETOUCH ULTRASOFT) lancets USE 1  TO CHECK GLUCOSE ONCE DAILY 100 each 1  . latanoprost (XALATAN) 0.005 % ophthalmic solution latanoprost 0.005 % eye drops  PLACE 1 DROP IN BOTH EYES NIGHTLY    . levothyroxine (SYNTHROID, LEVOTHROID) 50 MCG tablet Take 1 tablet (50 mcg total) by mouth daily. 90 tablet 1  . memantine (NAMENDA) 10 MG tablet TAKE 1 TABLET BY MOUTH TWICE A DAY 180 tablet 4  . metFORMIN (GLUCOPHAGE) 500 MG tablet Take 1 tablet in AM with meal, Take 2 tablets in PM with meal. 270 tablet 1  . metoprolol succinate (TOPROL-XL) 50 MG 24 hr tablet TAKE 1 TABLET BY MOUTH DAILY. TAKE WITH OR IMMEDIATELY FOLLOWING A MEAL. 90 tablet 2  . nateglinide (STARLIX) 60 MG tablet nateglinide 60 mg tablet    . nitroGLYCERIN (NITROSTAT) 0.4 MG SL tablet Place 0.4 mg under the tongue every 5 (five) minutes as needed for chest pain. Reported on 12/21/2015    . ONETOUCH DELICA LANCETS 38H MISC Use 1 lancet to check glucose once daily 100 each 1  . OVER THE COUNTER MEDICATION Place 1 patch onto the skin daily as needed (pain). Arthritis pain reliever patch    . pantoprazole (PROTONIX) 20 MG tablet Take 1 tablet (20 mg total) by mouth daily. 90 tablet 3  . phenazopyridine (PYRIDIUM) 100 MG tablet phenazopyridine 100 mg tablet    . potassium chloride (KLOR-CON 10) 10 MEQ tablet Take 1 tablet (10 mEq total) by mouth daily. 90 tablet 3  . sitaGLIPtin (JANUVIA) 100 MG tablet Januvia 100 mg tablet     No current facility-administered medications on file prior to visit.     ALLERGIES: Allergies  Allergen Reactions  . Penicillins Swelling      . Sulfonamide Derivatives Other (See Comments)    REACTION: "like my head was full of water"  . Albumin (Human) Other (See Comments)    Doesn't remember   . Clindamycin Other (See Comments)    Doesn't remember   . Iodides Hives  . Iodinated Diagnostic Agents Hives  . Sulfa Antibiotics Hives  . Celecoxib Itching  and Rash  . Erythromycin Itching and Rash  . Nitrofuran Derivatives Rash  . Nitrofurantoin Itching and Rash  . Piroxicam Other (See Comments)    unknown  . Povidone-Iodine Itching and Rash    FAMILY HISTORY: Family History  Problem Relation Age of Onset  . Breast cancer Sister        "behind heart"  . Colon cancer Sister   . Colon cancer Sister   . Breast cancer Daughter   . Breast cancer Daughter   . Breast cancer Daughter   . Kidney cancer Daughter  SOCIAL HISTORY: Social History   Socioeconomic History  . Marital status: Widowed    Spouse name: Not on file  . Number of children: 8  . Years of education: 10th Grade  . Highest education level: Not on file  Occupational History  . Not on file  Social Needs  . Financial resource strain: Not on file  . Food insecurity:    Worry: Not on file    Inability: Not on file  . Transportation needs:    Medical: Not on file    Non-medical: Not on file  Tobacco Use  . Smoking status: Never Smoker  . Smokeless tobacco: Current User    Types: Snuff  Substance and Sexual Activity  . Alcohol use: No    Alcohol/week: 0.0 standard drinks  . Drug use: No  . Sexual activity: Never  Lifestyle  . Physical activity:    Days per week: Not on file    Minutes per session: Not on file  . Stress: Not on file  Relationships  . Social connections:    Talks on phone: Not on file    Gets together: Not on file    Attends religious service: Not on file    Active member of club or organization: Not on file    Attends meetings of clubs or organizations: Not on file    Relationship status: Not on file  . Intimate partner violence:    Fear of current or ex partner: Not on file    Emotionally abused: Not on file    Physically abused: Not on file    Forced sexual activity: Not on file  Other Topics Concern  . Not on file  Social History Narrative   Patient lives with daughter in a two story home.   Patient has a 10th grade  education.   Patient is retired.   Patient is right handed.    REVIEW OF SYSTEMS: Constitutional: No fevers, chills, or sweats, no generalized fatigue, change in appetite Eyes: No visual changes, double vision, eye pain Ear, nose and throat: No hearing loss, ear pain, nasal congestion, sore throat Cardiovascular: No chest pain, palpitations Respiratory:  No shortness of breath at rest or with exertion, wheezes GastrointestinaI: No nausea, vomiting, diarrhea, +abdominal pain, no fecal incontinence Genitourinary:  No dysuria, urinary retention or frequency Musculoskeletal:  No neck pain, +back pain Integumentary: No rash, pruritus, skin lesions Neurological: as above Psychiatric: No depression, insomnia, anxiety Endocrine: No palpitations, fatigue, diaphoresis, mood swings, change in appetite, change in weight, increased thirst Hematologic/Lymphatic:  No anemia, purpura, petechiae. Allergic/Immunologic: no itchy/runny eyes, nasal congestion, recent allergic reactions, rashes  PHYSICAL EXAM: Vitals:   10/12/18 0832  BP: (!) 148/64  Pulse: 76  SpO2: 98%   General: No acute distress, sitting in wheelchair Head:  Normocephalic/atraumatic Neck: supple, no paraspinal tenderness, full range of motion Heart:  Regular rate and rhythm Lungs:  Clear to auscultation bilaterally Back: No paraspinal tenderness Skin/Extremities: No rash, no edema Neurological Exam: alert and oriented to person, place. No aphasia or dysarthria. Fund of knowledge is appropriate.  Remote memory intact. 0/3 delayed recall. Attention and concentration are reduced, unable to spell WORLD or do serial 7s. Able to name objects and repeat phrases. CDT 5/5  MMSE - Mini Mental State Exam 10/12/2018 03/23/2018 03/17/2017  Orientation to time 0 1 1  Orientation to Place 4 4 4   Registration 3 3 3   Attention/ Calculation 0 0 0  Recall 0 0 0  Language- name 2  objects 2 2 2   Language- repeat 1 1 1   Language- follow 3 step  command 3 3 3   Language- read & follow direction 1 1 1   Write a sentence 1 1 1   Copy design 0 1 0  Total score 15 17 16     Cranial nerves: Pupils equal, round, reactive to light. Extraocular movements intact with no nystagmus. Visual fields full. Facial sensation intact. No facial asymmetry. Tongue, uvula, palate midline.  Motor: Bulk and tone normal, muscle strength 4/5 on proximal left UE, 5/5 distally, 5/5 on right UE and both LE.  Sensation to light touch, cold intact.  No extinction to double simultaneous stimulation.  Deep tendon reflexes +2 throughout.  Finger to nose testing showed ataxic hemiparesis on the left UE (similar to prior).  Gait not tested, sitting on wheelchair (needs 2-person assist)  IMPRESSION: This is a pleasant 84 yo RH woman with a history of hypertension, diabetes, CAD, atrial fibrillation, mild to moderate dementia, stroke in October 2016 with residual left hemiparesis, who initially presented for evaluation of possible seizures. She has been having recurrent episodes of loss of consciousness for several years, admitted in August 2016 for transient aphasia, and during her hospital stay had an episode of generalized stiffening and unresponsiveness. She does have risk factors for seizures with dementia and family history of seizures. Convulsive syncope is also a consideration. She had a syncopal episode last June 2017, no seizure-like activity, with quick return to baseline. Her 24-hour EEG did not show any epileptiform discharges, there was occasional focal slowing over the left temporal region. No further similar spells since June 2017. She continues to have progression of dementia with more behavioral changes (paranoia). MMSE today 15/30. Continue Memantine 10mg  BID. We discussed increasing Depakote ER to 250mg  in AM, 500mg  in PM. This may help with headaches as well. She is having more walking difficulties despite normal leg strength, possibly due to arthritis in her lower  back. We again discussed the importance of control of vascular risk factors, physical exercise, and brain stimulation exercises for brain health. Continue 24/7 care. She will follow-up in 6 months and knows to call for any changes.  Thank you for allowing me to participate in her care.  Please do not hesitate to call for any questions or concerns.  The duration of this appointment visit was 30 minutes of face-to-face time with the patient.  Greater than 50% of this time was spent in counseling, explanation of diagnosis, planning of further management, and coordination of care.   Ellouise Newer, M.D.   CC: Dr. Quay Burow

## 2018-10-12 NOTE — Patient Instructions (Signed)
1. Increase Depakote 250mg : take 1 tab in AM, 2 tabs in PM 2. Continue Memantine 10mg  twice a day 3. Follow-up in 6 months, call for any changes  FALL PRECAUTIONS: Be cautious when walking. Scan the area for obstacles that may increase the risk of trips and falls. When getting up in the mornings, sit up at the edge of the bed for a few minutes before getting out of bed. Consider elevating the bed at the head end to avoid drop of blood pressure when getting up. Walk always in a well-lit room (use night lights in the walls). Avoid area rugs or power cords from appliances in the middle of the walkways. Use a walker or a cane if necessary and consider physical therapy for balance exercise. Get your eyesight checked regularly.  HOME SAFETY: Consider the safety of the kitchen when operating appliances like stoves, microwave oven, and blender. Consider having supervision and share cooking responsibilities until no longer able to participate in those. Accidents with firearms and other hazards in the house should be identified and addressed as well.   ABILITY TO BE LEFT ALONE: If patient is unable to contact 911 operator, consider using LifeLine, or when the need is there, arrange for someone to stay with patients. Smoking is a fire hazard, consider supervision or cessation. Risk of wandering should be assessed by caregiver and if detected at any point, supervision and safe proof recommendations should be instituted.   RECOMMENDATIONS FOR ALL PATIENTS WITH MEMORY PROBLEMS: 1. Continue to exercise (Recommend 30 minutes of walking everyday, or 3 hours every week) 2. Increase social interactions - continue going to [place] and enjoy social gatherings with friends and family 3. Eat healthy, avoid fried foods and eat more fruits and vegetables 4. Maintain adequate blood pressure, blood sugar, and blood cholesterol level. Reducing the risk of stroke and cardiovascular disease also helps promoting better memory. 5.  Avoid stressful situations. Live a simple life and avoid aggravations. Organize your time and prepare for the next day in anticipation. 6. Sleep well, avoid any interruptions of sleep and avoid any distractions in the bedroom that may interfere with adequate sleep quality 7. Avoid sugar, avoid sweets as there is a strong link between excessive sugar intake, diabetes, and cognitive impairment The Mediterranean diet has been shown to help patients reduce the risk of progressive memory disorders and reduces cardiovascular risk. This includes eating fish, eat fruits and green leafy vegetables, nuts like almonds and hazelnuts, walnuts, and also use olive oil. Avoid fast foods and fried foods as much as possible. Avoid sweets and sugar as sugar use has been linked to worsening of memory function.  There is always a concern of gradual progression of memory problems. If this is the case, then we may need to adjust level of care according to patient needs. Support, both to the patient and caregiver, should then be put into place.

## 2018-10-15 ENCOUNTER — Ambulatory Visit: Payer: Self-pay | Admitting: *Deleted

## 2018-10-15 NOTE — Telephone Encounter (Signed)
Ideal BP is 120-140's/60-80's.  If her BP is too low or she is symptomatic with lightheadedness with any BP then we need to change her medication.  We want to look at the overall average BP's -- if it is too frequently on the low side we can decrease the irbesartan back to 75 mg daily

## 2018-10-15 NOTE — Telephone Encounter (Signed)
Notified pt daughter shirley w/MD response.Marland KitchenJohny [name]

## 2018-10-15 NOTE — Telephone Encounter (Signed)
Spoke with patient's daughter, [name], she is concerned that the pt's blood pressure is significantly lower since her irbesartan was changed to 150 daily; yesterday her blood pressure was 108/65; today it was 124/74; she says that the pt normal systolic is 005'R-102'T; she would like to know what is a "good" blood pressure; recommendations made per nurse triage protocol; the pt's daughter that she will bring the pt in if necessary, but she prefers a callback from office first; [name] can be contacted at [phone]; the pt was last seen by Dr Quay Burow, [place] on 10/09/18; will route to office for notification.     Reason for Disposition . [1] Fall in systolic BP > 20 mm Hg from normal AND [2] NOT dizzy, lightheaded, or weak  Answer Assessment - Initial Assessment Questions 1. BLOOD PRESSURE: "What is the blood pressure?" "Did you take at least two measurements 5 minutes apart?"     108/65 and 124/74 2. ONSET: "When did you take your blood pressure?"     10/14/18 at 0400 and 10/15/18 at 0717 3. HOW: "How did you obtain the blood pressure?" (e.g., visiting nurse, automatic home BP monitor)     Automatic cuff on right wrist 4. HISTORY: "Do you have a history of low blood pressure?" "What is your blood pressure normally?"     no 5. MEDICATIONS: "Are you taking any medications for blood pressure?" If yes: "Have they been changed recently?"     yes 6. PULSE RATE: "Do you know what your pulse rate is?"      71 7. OTHER SYMPTOMS: "Have you been sick recently?" "Have you had a recent injury?"     no 8. PREGNANCY: "Is there any chance you are pregnant?" "When was your last menstrual period?"     no  Protocols used: LOW BLOOD PRESSURE-A-AH

## 2018-11-05 ENCOUNTER — Encounter: Payer: Self-pay | Admitting: Podiatry

## 2018-11-05 ENCOUNTER — Ambulatory Visit (INDEPENDENT_AMBULATORY_CARE_PROVIDER_SITE_OTHER): Payer: Medicare Other | Admitting: Podiatry

## 2018-11-05 DIAGNOSIS — M79675 Pain in left toe(s): Secondary | ICD-10-CM

## 2018-11-05 DIAGNOSIS — M79674 Pain in right toe(s): Secondary | ICD-10-CM | POA: Diagnosis not present

## 2018-11-05 DIAGNOSIS — B351 Tinea unguium: Secondary | ICD-10-CM

## 2018-11-05 NOTE — Patient Instructions (Signed)
Diabetes Mellitus and Foot Care  Foot care is an important part of your health, especially when you have diabetes. Diabetes may cause you to have problems because of poor blood flow (circulation) to your feet and legs, which can cause your skin to:   Become thinner and drier.   Break more easily.   Heal more slowly.   Peel and crack.  You may also have nerve damage (neuropathy) in your legs and feet, causing decreased feeling in them. This means that you may not notice minor injuries to your feet that could lead to more serious problems. Noticing and addressing any potential problems early is the best way to prevent future foot problems.  How to care for your feet  Foot hygiene   Wash your feet daily with warm water and mild soap. Do not use hot water. Then, pat your feet and the areas between your toes until they are completely dry. Do not soak your feet as this can dry your skin.   Trim your toenails straight across. Do not dig under them or around the cuticle. File the edges of your nails with an emery board or nail file.   Apply a moisturizing lotion or petroleum jelly to the skin on your feet and to dry, brittle toenails. Use lotion that does not contain alcohol and is unscented. Do not apply lotion between your toes.  Shoes and socks   Wear clean socks or stockings every day. Make sure they are not too tight. Do not wear knee-high stockings since they may decrease blood flow to your legs.   Wear shoes that fit properly and have enough cushioning. Always look in your shoes before you put them on to be sure there are no objects inside.   To break in new shoes, wear them for just a few hours a day. This prevents injuries on your feet.  Wounds, scrapes, corns, and calluses   Check your feet daily for blisters, cuts, bruises, sores, and redness. If you cannot see the bottom of your feet, use a mirror or ask someone for help.   Do not cut corns or calluses or try to remove them with medicine.   If you  find a minor scrape, cut, or break in the skin on your feet, keep it and the skin around it clean and dry. You may clean these areas with mild soap and water. Do not clean the area with peroxide, alcohol, or iodine.   If you have a wound, scrape, corn, or callus on your foot, look at it several times a day to make sure it is healing and not infected. Check for:  ? Redness, swelling, or pain.  ? Fluid or blood.  ? Warmth.  ? Pus or a bad smell.  General instructions   Do not cross your legs. This may decrease blood flow to your feet.   Do not use heating pads or hot water bottles on your feet. They may burn your skin. If you have lost feeling in your feet or legs, you may not know this is happening until it is too late.   Protect your feet from hot and cold by wearing shoes, such as at the beach or on hot pavement.   Schedule a complete foot exam at least once a year (annually) or more often if you have foot problems. If you have foot problems, report any cuts, sores, or bruises to your health care provider immediately.  Contact a health care provider if:     You have a medical condition that increases your risk of infection and you have any cuts, sores, or bruises on your feet.   You have an injury that is not healing.   You have redness on your legs or feet.   You feel burning or tingling in your legs or feet.   You have pain or cramps in your legs and feet.   Your legs or feet are numb.   Your feet always feel cold.   You have pain around a toenail.  Get help right away if:   You have a wound, scrape, corn, or callus on your foot and:  ? You have pain, swelling, or redness that gets worse.  ? You have fluid or blood coming from the wound, scrape, corn, or callus.  ? Your wound, scrape, corn, or callus feels warm to the touch.  ? You have pus or a bad smell coming from the wound, scrape, corn, or callus.  ? You have a fever.  ? You have a red line going up your leg.  Summary   Check your feet every day  for cuts, sores, red spots, swelling, and blisters.   Moisturize feet and legs daily.   Wear shoes that fit properly and have enough cushioning.   If you have foot problems, report any cuts, sores, or bruises to your health care provider immediately.   Schedule a complete foot exam at least once a year (annually) or more often if you have foot problems.  This information is not intended to replace advice given to you by your health care provider. Make sure you discuss any questions you have with your health care provider.  Document Released: 10/25/2000 Document Revised: 12/10/2017 Document Reviewed: 11/29/2016  Elsevier Interactive Patient Education  2019 [employer]vier Inc.

## 2018-11-05 NOTE — Progress Notes (Signed)
Subjective: [name] presents today with painful, thick toenails 1-5 b/l that she cannot cut and which interfere with daily activities.  Pain is aggravated when wearing enclosed shoe gear and relieved with periodic professional debridement.  Her daughter and granddaughter are present during today's visit. [name] is diabetic and her daughter relates no new pedal concerns on today's visit.  Binnie Rail, MD is her PCP and last DOS was 10/09/2018.   Current Outpatient Medications:  .  acetaminophen (TYLENOL) 325 MG tablet, Take 650 mg by mouth every 6 (six) hours as needed for mild pain., Disp: , Rfl:  .  amLODipine (NORVASC) 5 MG tablet, TAKE 1 TABLET BY MOUTH DAILY PLEASE KEEP UPCOMING APPT IN FEBRUARY FOR FUTURE REFILLS., Disp: 90 tablet, Rfl: 3 .  atorvastatin (LIPITOR) 40 MG tablet, Take 1 tablet daily, Disp: 90 tablet, Rfl: 3 .  ciprofloxacin (CIPRO) 250 MG tablet, ciprofloxacin 250 mg tablet, Disp: , Rfl:  .  divalproex (DEPAKOTE ER) 250 MG 24 hr tablet, Take 1 tab in AM, 2 tabs in PM, Disp: 270 tablet, Rfl: 3 .  ELIQUIS 2.5 MG TABS tablet, TAKE 1 TABLET BY MOUTH TWICE A DAY, Disp: 180 tablet, Rfl: 1 .  fluticasone (FLONASE) 50 MCG/ACT nasal spray, fluticasone propionate 50 mcg/actuation nasal spray,suspension, Disp: , Rfl:  .  glucose blood (ONE TOUCH ULTRA TEST) test strip, USE 1 STRIP TO CHECK GLUCOSE ONCE DAILY DX CODE E11.9., Disp: 100 each, Rfl: 3 .  irbesartan (AVAPRO) 150 MG tablet, Take 1 tablet (150 mg total) by mouth daily., Disp: 90 tablet, Rfl: 1 .  iron polysaccharides (FERREX 150) 150 MG capsule, Ferrex 150 mg iron capsule  TAKE ONE CAPSULE BY MOUTH TWICE A DAY, Disp: , Rfl:  .  Lancets (ONETOUCH ULTRASOFT) lancets, USE 1  TO CHECK GLUCOSE ONCE DAILY, Disp: 100 each, Rfl: 1 .  latanoprost (XALATAN) 0.005 % ophthalmic solution, latanoprost 0.005 % eye drops  PLACE 1 DROP IN BOTH EYES NIGHTLY, Disp: , Rfl:  .  levothyroxine (SYNTHROID, LEVOTHROID) 50 MCG tablet,  Take 1 tablet (50 mcg total) by mouth daily., Disp: 90 tablet, Rfl: 1 .  memantine (NAMENDA) 10 MG tablet, Take 1 tablet (10 mg total) by mouth 2 (two) times daily., Disp: 180 tablet, Rfl: 3 .  metFORMIN (GLUCOPHAGE) 500 MG tablet, Take 1 tablet in AM with meal, Take 2 tablets in PM with meal., Disp: 270 tablet, Rfl: 1 .  metoprolol succinate (TOPROL-XL) 50 MG 24 hr tablet, TAKE 1 TABLET BY MOUTH DAILY. TAKE WITH OR IMMEDIATELY FOLLOWING A MEAL., Disp: 90 tablet, Rfl: 2 .  nateglinide (STARLIX) 60 MG tablet, nateglinide 60 mg tablet, Disp: , Rfl:  .  nitroGLYCERIN (NITROSTAT) 0.4 MG SL tablet, Place 0.4 mg under the tongue every 5 (five) minutes as needed for chest pain. Reported on 12/21/2015, Disp: , Rfl:  .  ONETOUCH DELICA LANCETS 62X MISC, Use 1 lancet to check glucose once daily, Disp: 100 each, Rfl: 1 .  OVER THE COUNTER MEDICATION, Place 1 patch onto the skin daily as needed (pain). Arthritis pain reliever patch, Disp: , Rfl:  .  pantoprazole (PROTONIX) 20 MG tablet, Take 1 tablet (20 mg total) by mouth daily., Disp: 90 tablet, Rfl: 3 .  phenazopyridine (PYRIDIUM) 100 MG tablet, phenazopyridine 100 mg tablet, Disp: , Rfl:  .  potassium chloride (KLOR-CON 10) 10 MEQ tablet, Take 1 tablet (10 mEq total) by mouth daily., Disp: 90 tablet, Rfl: 3 .  sitaGLIPtin (JANUVIA) 100 MG tablet, Januvia  100 mg tablet, Disp: , Rfl:    Allergies     ClindamycinOther (See Comments)  IodidesHives  Iodinated Diagnostic AgentsHives  PenicillinsSwelling  Sulfa AntibioticsHives  Sulfonamide DerivativesOther (See Comments)  Albumin (Human)Other (See Comments)  Albumin Human-interferon Beta-1a [Interferon Beta-1a]  Chlorpheniramine  Povidone IodineOther (See Comments)  CelecoxibItching, Rash  ErythromycinItching, Rash  Nitrofuran DerivativesRash  NitrofurantoinItching, Rash  PiroxicamOther (See Comments)  Povidone-iodineItching, Rash    Objective:  Vascular Examination: Capillary refill time  immediate x 10 digits Dorsalis pedis and Posterior tibial pulses palpable b/l Digital hair absent x 10 digits Skin temperature gradient WNL b/l  Dermatological Examination: Skin thin and atrophic b/l  Toenails 1-5 b/l discolored, thick, dystrophic with subungual debris and pain with palpation to nailbeds due to thickness of nails.  Musculoskeletal: Muscle strength 5/5 to all LE muscle groups  No crepitus or effusions b/l  No gross pedal abnormalities b/l  Neurological: Sensation intact with 10 gram monofilament.  Vibratory sensation intact.  Assessment: Painful onychomycosis toenails 1-5 b/l   Plan: 1. Toenails 1-5 b/l were debrided in length and girth without iatrogenic bleeding. 2. Patient to continue soft, supportive shoe gear 3. Patient to report any pedal injuries to medical professional immediately. 4. Follow up 3 months. Patient/POA to call should there be a concern in the interim.

## 2018-11-10 ENCOUNTER — Other Ambulatory Visit: Payer: Self-pay

## 2018-11-10 ENCOUNTER — Ambulatory Visit: Payer: Self-pay

## 2018-11-10 MED ORDER — IRBESARTAN 150 MG PO TABS: 150.0000 mg | ORAL_TABLET | Freq: Every day | ORAL | 1 refills | Status: DC

## 2018-11-10 NOTE — Telephone Encounter (Signed)
[facility]'t [facility] the irbesartan (AVAPRO) 150 MG tablet 150mg  of the but [place] ([place] do they need a new RX sent to the [facility] pt has been out for two days daughter need this done today it the irbesartan (AVAPRO) 150 MG tablet  Reason for Disposition . Caller has medication question about med not prescribed by PCP and triager unable to answer question (e.g., compatibility with other med, storage)  Answer Assessment - Initial Assessment Questions 1. SYMPTOMS: "Do you have any symptoms?"     n/a 2. SEVERITY: If symptoms are present, ask "Are they mild, moderate or severe?" n/a [facility]'t [facility] the irbesartan (AVAPRO) 150 MG tablet 150mg  of the but [place] ([place]), [place] do they need a new RX sent to the [facility] pt has been out for two days daughter need this done today it the irbesartan (AVAPRO) 150 MG tablet  Protocols used: MEDICATION QUESTION CALL-A-AH

## 2018-11-10 NOTE — Progress Notes (Signed)
[facility]'t [facility] the irbesartan (AVAPRO) 150 MG tablet 150mg  of the but [place] ([place]), [place] do they need a new RX sent to the [facility] pt has been out for two days daughter need this done today it the irbesartan (AVAPRO) 150 MG tablet

## 2018-11-24 NOTE — Progress Notes (Signed)
Subjective:    Patient ID: [name], female    DOB: Mar 12, 1925, 85 y.o.   MRN: [id number]  HPI The patient is here for follow up.  She is here with her daughter who provides most of the history due to the patient dementia.    Abnormal color:  She has a history of UTIs.  She has dark urine.  She denies dysuria.   Diabetes: She is taking her medication daily as prescribed. She is not  compliant with a diabetic diet. Her sugar was 188 yesterday morning.  they can range 145-180. She is not exercising regularly.     CAD, PaFib, Hypertension: She is taking her medication daily. She is compliant with a low sodium diet.  She denies chest pain, palpitations, edema, shortness of breath and regular headaches. She is not exercising .       Hyperlipidemia: She is taking her medication daily. She is compliant with a low fat/cholesterol diet. She is not exercising. She denies myalgias.   Hypothyroidism:  She is taking her medication daily.  She denies any recent changes in energy or weight that are unexplained.   GERD:  She is taking her medication daily as prescribed.  She denies any GERD symptoms and feels her GERD is well controlled.   Poor balance, physical deconditioning:  She has done PT in the past and it has helped.  She is very sedentary and does not exercise regularly.  She does walk around her house.  Her daughter wonders about more PT>   Medications and allergies reviewed with patient and updated if appropriate.  Patient Active Problem List   Diagnosis Date Noted  . Hypothyroidism 05/29/2018  . Poor balance 05/26/2018  . Physical deconditioning 05/26/2018  . Generalized weakness 02/23/2018  . Skin tear of right lower leg without complication 05/10/1600  . Acute cystitis without hematuria 10/22/2017  . Left ankle pain 10/03/2017  . Moderate dementia with behavioral disturbance ([place]) 03/21/2017  . Cerebral infarction ([place]) 12/27/2015  . Convulsions ([place]) 09/11/2015  . SVT  (supraventricular tachycardia) ([place])   . Fall 08/21/2015  . PAF (paroxysmal atrial fibrillation) ([place])   . Stroke (cerebrum) ([place]) 08/20/2015  . Aphasia   . TIA (transient ischemic attack)   . FTT (failure to thrive) in adult   . Syncope 10/31/2014  . Diabetes mellitus type 2, controlled ([place]) 10/31/2014  . Uncontrolled hypertension 10/31/2014  . Incomplete emptying of bladder 05/17/2014  . Headache 11/26/2013  . Atypical chest pain 06/29/2013  . Urinary tract infection without hematuria 08/20/2011  . CAD, NATIVE VESSEL 02/14/2010  . GERD 02/14/2010  . ANEMIA-B12 DEFICIENCY 01/01/2010  . OSTEOARTHRITIS 01/01/2010  . PERSONAL HX COLONIC POLYPS 01/01/2010  . Iron deficiency anemia 12/28/2009  . DIZZINESS AND GIDDINESS 12/28/2009  . Suprapubic abdominal pain 09/13/2009  . Cough 12/13/2008  . Chronic lower back pain 08/11/2008  . KNEE PAIN, RIGHT 02/11/2008  . Dyslipidemia 04/23/2007  . Allergic rhinitis 04/23/2007  . GALLBLADDER DISEASE 04/23/2007  . DIVERTICULITIS, HX OF 04/23/2007    Current Outpatient Medications on File Prior to Visit  Medication Sig Dispense Refill  . acetaminophen (TYLENOL) 325 MG tablet Take 650 mg by mouth every 6 (six) hours as needed for mild pain.    [name] amLODipine (NORVASC) 5 MG tablet TAKE 1 TABLET BY MOUTH DAILY PLEASE KEEP UPCOMING APPT IN FEBRUARY FOR FUTURE REFILLS. 90 tablet 3  . atorvastatin (LIPITOR) 40 MG tablet Take 1 tablet daily 90 tablet 3  . divalproex (DEPAKOTE  ER) 250 MG 24 hr tablet Take 1 tab in AM, 2 tabs in PM 270 tablet 3  . ELIQUIS 2.5 MG TABS tablet TAKE 1 TABLET BY MOUTH TWICE A DAY 180 tablet 1  . fluticasone (FLONASE) 50 MCG/ACT nasal spray fluticasone propionate 50 mcg/actuation nasal spray,suspension    . glucose blood (ONE TOUCH ULTRA TEST) test strip USE 1 STRIP TO CHECK GLUCOSE ONCE DAILY DX CODE E11.9. 100 each 3  . irbesartan (AVAPRO) 150 MG tablet Take 1 tablet (150 mg total) by mouth daily. 90 tablet 1  . iron  polysaccharides (FERREX 150) 150 MG capsule Ferrex 150 mg iron capsule  TAKE ONE CAPSULE BY MOUTH TWICE A DAY    . Lancets (ONETOUCH ULTRASOFT) lancets USE 1  TO CHECK GLUCOSE ONCE DAILY 100 each 1  . latanoprost (XALATAN) 0.005 % ophthalmic solution latanoprost 0.005 % eye drops  PLACE 1 DROP IN BOTH EYES NIGHTLY    . levothyroxine (SYNTHROID, LEVOTHROID) 50 MCG tablet Take 1 tablet (50 mcg total) by mouth daily. 90 tablet 1  . metFORMIN (GLUCOPHAGE) 500 MG tablet Take 1 tablet in AM with meal, Take 2 tablets in PM with meal. 270 tablet 1  . metoprolol succinate (TOPROL-XL) 50 MG 24 hr tablet TAKE 1 TABLET BY MOUTH DAILY. TAKE WITH OR IMMEDIATELY FOLLOWING A MEAL. 90 tablet 2  . nitroGLYCERIN (NITROSTAT) 0.4 MG SL tablet Place 0.4 mg under the tongue every 5 (five) minutes as needed for chest pain. Reported on 12/21/2015    . ONETOUCH DELICA LANCETS 86P MISC Use 1 lancet to check glucose once daily 100 each 1  . OVER THE COUNTER MEDICATION Place 1 patch onto the skin daily as needed (pain). Arthritis pain reliever patch    . pantoprazole (PROTONIX) 20 MG tablet Take 1 tablet (20 mg total) by mouth daily. 90 tablet 3  . potassium chloride (KLOR-CON 10) 10 MEQ tablet Take 1 tablet (10 mEq total) by mouth daily. 90 tablet 3   No current facility-administered medications on file prior to visit.     Past Medical History:  Diagnosis Date  . Abdominal pain   . Allergic rhinitis, cause unspecified   . Anemia B twelve deficiency   . Arrhythmia    paroxysmal atrial fibrillation  . Arthritis    "arms and hands" (06/30/2013)  . Benign neoplasm of colon   . Colon polyp   . Coronary artery disease   . Degeneration of lumbar or lumbosacral intervertebral disc   . Dizziness - giddy   . Edema   . GERD (gastroesophageal reflux disease)   . Hyperlipidemia   . Hypertension   . Iron deficiency anemia, unspecified   . Lumbago   . Pernicious anemia   . Seizures ([place])   . Stroke ([place])   . Symptomatic  menopausal or female climacteric states   . Thoracic or lumbosacral neuritis or radiculitis, unspecified   . Type II diabetes mellitus ([place])     Past Surgical History:  Procedure Laterality Date  . ABDOMINAL HYSTERECTOMY  1974  . APPENDECTOMY  1974  . CARDIAC CATHETERIZATION    . CARPAL TUNNEL RELEASE Bilateral 1970's  . CATARACT EXTRACTION, BILATERAL Bilateral   . CHOLECYSTECTOMY    . CORONARY ANGIOPLASTY WITH STENT PLACEMENT     "1" (06/30/2013  . LUMBAR DISC SURGERY      Social History   Socioeconomic History  . Marital status: Widowed    Spouse name: Not on file  . Number of children: 8  .  Years of education: 10th Grade  . Highest education level: Not on file  Occupational History  . Not on file  Social Needs  . Financial resource strain: Not on file  . Food insecurity:    Worry: Not on file    Inability: Not on file  . Transportation needs:    Medical: Not on file    Non-medical: Not on file  Tobacco Use  . Smoking status: Never Smoker  . Smokeless tobacco: Current User    Types: Snuff  Substance and Sexual Activity  . Alcohol use: No    Alcohol/week: 0.0 standard drinks  . Drug use: No  . Sexual activity: Never  Lifestyle  . Physical activity:    Days per week: Not on file    Minutes per session: Not on file  . Stress: Not on file  Relationships  . Social connections:    Talks on phone: Not on file    Gets together: Not on file    Attends religious service: Not on file    Active member of club or organization: Not on file    Attends meetings of clubs or organizations: Not on file    Relationship status: Not on file  Other Topics Concern  . Not on file  Social History Narrative   Patient lives with daughter in a two story home.   Patient has a 10th grade education.   Patient is retired.   Patient is right handed.    Family History  Problem Relation Age of Onset  . Breast cancer Sister        "behind heart"  . Colon cancer Sister   . Colon  cancer Sister   . Breast cancer Daughter   . Breast cancer Daughter   . Breast cancer Daughter   . Kidney cancer Daughter     Review of Systems  Constitutional: Negative for appetite change and fever.  Respiratory: Negative for cough, shortness of breath and wheezing.   Cardiovascular: Positive for leg swelling (feet). Negative for chest pain and palpitations.  Gastrointestinal: Positive for abdominal pain (chronic).  Genitourinary: Negative for dysuria.       Abnormal urine color  Musculoskeletal: Positive for back pain (chronic).  Neurological: Negative for light-headedness and headaches.       Objective:   Vitals:   11/25/18 0751  BP: (!) 162/90  Pulse: 76  Resp: 16  Temp: 98.4 F (36.9 C)  SpO2: 99%   BP Readings from Last 3 Encounters:  11/25/18 (!) 162/90  10/12/18 (!) 148/64  10/09/18 (!) 210/94   Wt Readings from Last 3 Encounters:  11/25/18 125 lb (56.7 kg)  10/12/18 121 lb (54.9 kg)  05/26/18 117 lb (53.1 kg)   Body mass index is 23.43 kg/m.   Physical Exam    Constitutional: Appears well-developed and well-nourished. No distress.  HENT:  Head: Normocephalic and atraumatic.  Neck: Neck supple. No tracheal deviation present. No thyromegaly present.  No cervical lymphadenopathy Cardiovascular: Normal rate, regular rhythm and normal heart sounds.   No murmur heard. No carotid bruit .  1+ pitting b/l LE edema Pulmonary/Chest: Effort normal and breath sounds normal. No respiratory distress. No has no wheezes. No rales.  Abd:  Soft, nontender, ND Skin: Skin is warm and dry. Not diaphoretic.  Psychiatric: Normal mood and affect. Behavior is normal.      Assessment & Plan:    See Problem List for Assessment and Plan of chronic medical problems.

## 2018-11-24 NOTE — Patient Instructions (Addendum)
  Tests ordered today. Your results will be released to [facility] (or called to you) after review, usually within 72hours after test completion. If any changes need to be made, you will be notified at that same time.  Medications reviewed and updated.  Changes include :   Stop metformin.  Start januvia 100 mg daily for her sugars.     Your prescription(s) have been submitted to your pharmacy. Please take as directed and contact our office if you believe you are having problem(s) with the medication(s).  A referral was ordered for home PT.  Please followup in 6 months

## 2018-11-25 ENCOUNTER — Ambulatory Visit (INDEPENDENT_AMBULATORY_CARE_PROVIDER_SITE_OTHER): Payer: Medicare Other | Admitting: Internal Medicine

## 2018-11-25 ENCOUNTER — Encounter: Payer: Self-pay | Admitting: Internal Medicine

## 2018-11-25 ENCOUNTER — Other Ambulatory Visit (INDEPENDENT_AMBULATORY_CARE_PROVIDER_SITE_OTHER): Payer: Medicare Other

## 2018-11-25 VITALS — BP 162/90 | HR 76 | Temp 98.4°F | Resp 16 | Ht 61.25 in | Wt 125.0 lb

## 2018-11-25 DIAGNOSIS — R3989 Other symptoms and signs involving the genitourinary system: Secondary | ICD-10-CM

## 2018-11-25 DIAGNOSIS — R5381 Other malaise: Secondary | ICD-10-CM

## 2018-11-25 DIAGNOSIS — D509 Iron deficiency anemia, unspecified: Secondary | ICD-10-CM | POA: Diagnosis not present

## 2018-11-25 DIAGNOSIS — N181 Chronic kidney disease, stage 1: Secondary | ICD-10-CM | POA: Diagnosis not present

## 2018-11-25 DIAGNOSIS — E039 Hypothyroidism, unspecified: Secondary | ICD-10-CM

## 2018-11-25 DIAGNOSIS — E1122 Type 2 diabetes mellitus with diabetic chronic kidney disease: Secondary | ICD-10-CM

## 2018-11-25 DIAGNOSIS — I480 Paroxysmal atrial fibrillation: Secondary | ICD-10-CM

## 2018-11-25 DIAGNOSIS — K219 Gastro-esophageal reflux disease without esophagitis: Secondary | ICD-10-CM | POA: Diagnosis not present

## 2018-11-25 DIAGNOSIS — I10 Essential (primary) hypertension: Secondary | ICD-10-CM | POA: Diagnosis not present

## 2018-11-25 DIAGNOSIS — E785 Hyperlipidemia, unspecified: Secondary | ICD-10-CM

## 2018-11-25 DIAGNOSIS — R2689 Other abnormalities of gait and mobility: Secondary | ICD-10-CM | POA: Diagnosis not present

## 2018-11-25 LAB — CBC WITH DIFFERENTIAL/PLATELET
Basophils Absolute: 0 10*3/uL (ref 0.0–0.1)
Basophils Relative: 0.2 % (ref 0.0–3.0)
Eosinophils Absolute: 0.1 10*3/uL (ref 0.0–0.7)
Eosinophils Relative: 2.7 % (ref 0.0–5.0)
HCT: 31.1 % — ABNORMAL LOW (ref 36.0–46.0)
Hemoglobin: 10 g/dL — ABNORMAL LOW (ref 12.0–15.0)
Lymphocytes Relative: 34.4 % (ref 12.0–46.0)
Lymphs Abs: 1.8 10*3/uL (ref 0.7–4.0)
MCHC: 32.2 g/dL (ref 30.0–36.0)
MCV: 89.9 fl (ref 78.0–100.0)
Monocytes Absolute: 0.6 10*3/uL (ref 0.1–1.0)
Monocytes Relative: 10.7 % (ref 3.0–12.0)
Neutro Abs: 2.7 10*3/uL (ref 1.4–7.7)
Neutrophils Relative %: 52 % (ref 43.0–77.0)
Platelets: 209 10*3/uL (ref 150.0–400.0)
RBC: 3.46 Mil/uL — ABNORMAL LOW (ref 3.87–5.11)
RDW: 16.8 % — ABNORMAL HIGH (ref 11.5–15.5)
WBC: 5.2 10*3/uL (ref 4.0–10.5)

## 2018-11-25 LAB — LIPID PANEL
Cholesterol: 117 mg/dL (ref 0–200)
HDL: 47.1 mg/dL (ref >39.00)
LDL Cholesterol: 52 mg/dL (ref 0–99)
NonHDL: 69.73
Total CHOL/HDL Ratio: 2
Triglycerides: 87 mg/dL (ref 0.0–149.0)
VLDL: 17.4 mg/dL (ref 0.0–40.0)

## 2018-11-25 LAB — URINALYSIS, ROUTINE W REFLEX MICROSCOPIC
Bilirubin Urine: NEGATIVE
Ketones, ur: NEGATIVE
Nitrite: NEGATIVE
Specific Gravity, Urine: 1.015 (ref 1.000–1.030)
Urine Glucose: 500 — AB
Urobilinogen, UA: 0.2 (ref 0.0–1.0)
pH: 6 (ref 5.0–8.0)

## 2018-11-25 LAB — HEMOGLOBIN A1C: Hgb A1c MFr Bld: 8.2 % — ABNORMAL HIGH (ref 4.6–6.5)

## 2018-11-25 LAB — COMPREHENSIVE METABOLIC PANEL
ALT: 12 U/L (ref 0–35)
AST: 17 U/L (ref 0–37)
Albumin: 3.7 g/dL (ref 3.5–5.2)
Alkaline Phosphatase: 68 U/L (ref 39–117)
BUN: 23 mg/dL (ref 6–23)
CO2: 26 mEq/L (ref 19–32)
Calcium: 9.2 mg/dL (ref 8.4–10.5)
Chloride: 105 mEq/L (ref 96–112)
Creatinine, Ser: 0.87 mg/dL (ref 0.40–1.20)
GFR: 78.32 mL/min (ref >60.00)
Glucose, Bld: 280 mg/dL — ABNORMAL HIGH (ref 70–99)
Potassium: 3.7 mEq/L (ref 3.5–5.1)
Sodium: 139 mEq/L (ref 135–145)
Total Bilirubin: 0.6 mg/dL (ref 0.2–1.2)
Total Protein: 6.7 g/dL (ref 6.0–8.3)

## 2018-11-25 LAB — TSH: TSH: 1.26 u[IU]/mL (ref 0.35–4.50)

## 2018-11-25 MED ORDER — SITAGLIPTIN PHOSPHATE 100 MG PO TABS: 100.0000 mg | ORAL_TABLET | Freq: Every day | ORAL | 5 refills | Status: DC

## 2018-11-25 NOTE — Assessment & Plan Note (Signed)
?   Metformin causing abdominal pain - will d/c for now - consider restarting if pain does not change Start januvia 100 mg daily If sugars too high will add metformin back or try glimepiride but family will need to monitor for lactic reaction given sulfa allergy A1c today Family check sugar daily

## 2018-11-25 NOTE — Assessment & Plan Note (Signed)
GERD controlled Continue daily medication  

## 2018-11-25 NOTE — Assessment & Plan Note (Signed)
Taking iron Check CBC, iron panel

## 2018-11-25 NOTE — Assessment & Plan Note (Signed)
History of UTIs Check urinalysis, urine culture

## 2018-11-25 NOTE — Assessment & Plan Note (Signed)
Will refer to PT Encouraged regular exercise after completing PT

## 2018-11-25 NOTE — Assessment & Plan Note (Signed)
Clinically euthyroid Check tsh  Titrate med dose if needed  

## 2018-11-25 NOTE — Assessment & Plan Note (Signed)
Rate controlled Asymptomatic Cbc, cmp

## 2018-11-25 NOTE — Assessment & Plan Note (Signed)
Check lipid panel  Continue daily statin  

## 2018-11-25 NOTE — Assessment & Plan Note (Signed)
BP variable - concern about risk of falls Will monitor on current medications cmp

## 2018-11-27 ENCOUNTER — Other Ambulatory Visit: Payer: Self-pay | Admitting: Cardiovascular Disease

## 2018-11-28 LAB — IRON,TIBC AND FERRITIN PANEL
%SAT: 13 % (calc) — ABNORMAL LOW (ref 16–45)
Ferritin: 47 ng/mL (ref 16–288)
Iron: 39 ug/dL — ABNORMAL LOW (ref 45–160)
TIBC: 293 mcg/dL (calc) (ref 250–450)

## 2018-11-28 LAB — URINE CULTURE
MICRO NUMBER:: 58945
SPECIMEN QUALITY:: ADEQUATE

## 2018-11-30 ENCOUNTER — Other Ambulatory Visit: Payer: Self-pay | Admitting: Internal Medicine

## 2018-11-30 ENCOUNTER — Telehealth: Payer: Self-pay

## 2018-11-30 DIAGNOSIS — M25562 Pain in left knee: Secondary | ICD-10-CM | POA: Diagnosis not present

## 2018-11-30 DIAGNOSIS — F0391 Unspecified dementia with behavioral disturbance: Secondary | ICD-10-CM | POA: Diagnosis not present

## 2018-11-30 DIAGNOSIS — M6281 Muscle weakness (generalized): Secondary | ICD-10-CM | POA: Diagnosis not present

## 2018-11-30 DIAGNOSIS — G8929 Other chronic pain: Secondary | ICD-10-CM | POA: Diagnosis not present

## 2018-11-30 DIAGNOSIS — I69398 Other sequelae of cerebral infarction: Secondary | ICD-10-CM | POA: Diagnosis not present

## 2018-11-30 DIAGNOSIS — I2510 Atherosclerotic heart disease of native coronary artery without angina pectoris: Secondary | ICD-10-CM | POA: Diagnosis not present

## 2018-11-30 DIAGNOSIS — M5136 Other intervertebral disc degeneration, lumbar region: Secondary | ICD-10-CM | POA: Diagnosis not present

## 2018-11-30 DIAGNOSIS — Z7984 Long term (current) use of oral hypoglycemic drugs: Secondary | ICD-10-CM | POA: Diagnosis not present

## 2018-11-30 DIAGNOSIS — I480 Paroxysmal atrial fibrillation: Secondary | ICD-10-CM | POA: Diagnosis not present

## 2018-11-30 DIAGNOSIS — E119 Type 2 diabetes mellitus without complications: Secondary | ICD-10-CM | POA: Diagnosis not present

## 2018-11-30 DIAGNOSIS — I10 Essential (primary) hypertension: Secondary | ICD-10-CM | POA: Diagnosis not present

## 2018-11-30 DIAGNOSIS — M25561 Pain in right knee: Secondary | ICD-10-CM | POA: Diagnosis not present

## 2018-11-30 DIAGNOSIS — M1990 Unspecified osteoarthritis, unspecified site: Secondary | ICD-10-CM | POA: Diagnosis not present

## 2018-11-30 DIAGNOSIS — Z7901 Long term (current) use of anticoagulants: Secondary | ICD-10-CM | POA: Diagnosis not present

## 2018-11-30 DIAGNOSIS — Z9111 Patient's noncompliance with dietary regimen: Secondary | ICD-10-CM | POA: Diagnosis not present

## 2018-11-30 MED ORDER — FOSFOMYCIN TROMETHAMINE 3 G PO PACK: 3.0000 g | PACK | Freq: Once | ORAL | 0 refills | Status: AC

## 2018-11-30 NOTE — Telephone Encounter (Signed)
FYI

## 2018-11-30 NOTE — Telephone Encounter (Signed)
noted 

## 2018-11-30 NOTE — Telephone Encounter (Signed)
Copied from [place] [id number]. Topic: General - Other >> Nov 27, 2018  5:35 PM Yvette Rack wrote: Reason for CRM: Suanne Marker with [facility] stated pt refused therapy until Monday 11/30/18.Cb# [phone]

## 2018-12-03 DIAGNOSIS — G8929 Other chronic pain: Secondary | ICD-10-CM | POA: Diagnosis not present

## 2018-12-03 DIAGNOSIS — M1990 Unspecified osteoarthritis, unspecified site: Secondary | ICD-10-CM | POA: Diagnosis not present

## 2018-12-03 DIAGNOSIS — M25562 Pain in left knee: Secondary | ICD-10-CM | POA: Diagnosis not present

## 2018-12-03 DIAGNOSIS — M25561 Pain in right knee: Secondary | ICD-10-CM | POA: Diagnosis not present

## 2018-12-03 DIAGNOSIS — M5136 Other intervertebral disc degeneration, lumbar region: Secondary | ICD-10-CM | POA: Diagnosis not present

## 2018-12-03 DIAGNOSIS — I69398 Other sequelae of cerebral infarction: Secondary | ICD-10-CM | POA: Diagnosis not present

## 2018-12-07 DIAGNOSIS — M1990 Unspecified osteoarthritis, unspecified site: Secondary | ICD-10-CM | POA: Diagnosis not present

## 2018-12-07 DIAGNOSIS — G8929 Other chronic pain: Secondary | ICD-10-CM | POA: Diagnosis not present

## 2018-12-07 DIAGNOSIS — M25561 Pain in right knee: Secondary | ICD-10-CM | POA: Diagnosis not present

## 2018-12-07 DIAGNOSIS — I69398 Other sequelae of cerebral infarction: Secondary | ICD-10-CM | POA: Diagnosis not present

## 2018-12-07 DIAGNOSIS — M5136 Other intervertebral disc degeneration, lumbar region: Secondary | ICD-10-CM | POA: Diagnosis not present

## 2018-12-07 DIAGNOSIS — M25562 Pain in left knee: Secondary | ICD-10-CM | POA: Diagnosis not present

## 2018-12-08 ENCOUNTER — Telehealth: Payer: Self-pay | Admitting: Internal Medicine

## 2018-12-08 DIAGNOSIS — N390 Urinary tract infection, site not specified: Secondary | ICD-10-CM

## 2018-12-08 MED ORDER — METFORMIN HCL 500 MG PO TABS: 500.0000 mg | ORAL_TABLET | Freq: Two times a day (BID) | ORAL | 1 refills | Status: DC

## 2018-12-08 NOTE — Telephone Encounter (Signed)
Spoke with pts daughter. Will try adding back the metformin. Also they never got the medicine for UTI bc it was too expensive and would like to know if something else can be sent in.

## 2018-12-08 NOTE — Telephone Encounter (Signed)
How did you want pt to take metformin? The way it is listed in direction on list or differently.

## 2018-12-08 NOTE — Telephone Encounter (Signed)
Copied from [place] [id number]. Topic: Quick Communication - See Telephone Encounter >> Dec 08, 2018  8:41 AM Valla Leaver wrote: CRM for notification. See Telephone encounter for: 12/08/18. Lynn Ito, daughter calling to report patients blood sugar is running high on the Januvia so per Dr. Quay Burow prior discussion she may need additional treatment. Last bllod sugar reading 225 at 5:30am today. Call [place] on home phone to discuss (other daughter).  Also [place] says Dr. Quay Burow was supposed to prescribe something for bladder infection/urinary tract but nothing was called in.

## 2018-12-08 NOTE — Telephone Encounter (Signed)
One tab bid - med list changed

## 2018-12-08 NOTE — Telephone Encounter (Signed)
Daughter is aware of response.  

## 2018-12-08 NOTE — Telephone Encounter (Signed)
Caller name: Enid Derry Padovano  Relation to pt: daughter  Call back number: [phone]    Reason for call:  Daughter called back and would like to know how often should patient take metformin, please advise

## 2018-12-08 NOTE — Telephone Encounter (Signed)
If her stomach symptoms have not changed off the metformin we should consider adding that back.  If not we can try glimepiride, but she may have hives, but it is related to sulfa.  We could try jardiance but it may increase her risk of UTIs.  Let me know.     The antibiotic for the UTI was sent to her pharmacy on 1/20 - it was a one dose only - fosfomycin.

## 2018-12-08 NOTE — Telephone Encounter (Signed)
She has an infection that is only susceptible to 3 antibiotics.  2 she is allergic to the other one is given in the hospital.  I am going to refer her to infectious disease to help treat this urinary tract infection.  As long as she is feeling well I think we can hold off on treatment until she sees them.  Referral ordered.

## 2018-12-09 DIAGNOSIS — M25562 Pain in left knee: Secondary | ICD-10-CM | POA: Diagnosis not present

## 2018-12-09 DIAGNOSIS — M5136 Other intervertebral disc degeneration, lumbar region: Secondary | ICD-10-CM | POA: Diagnosis not present

## 2018-12-09 DIAGNOSIS — G8929 Other chronic pain: Secondary | ICD-10-CM | POA: Diagnosis not present

## 2018-12-09 DIAGNOSIS — M25561 Pain in right knee: Secondary | ICD-10-CM | POA: Diagnosis not present

## 2018-12-09 DIAGNOSIS — I69398 Other sequelae of cerebral infarction: Secondary | ICD-10-CM | POA: Diagnosis not present

## 2018-12-09 DIAGNOSIS — M1990 Unspecified osteoarthritis, unspecified site: Secondary | ICD-10-CM | POA: Diagnosis not present

## 2018-12-10 ENCOUNTER — Other Ambulatory Visit: Payer: Self-pay | Admitting: Cardiovascular Disease

## 2018-12-14 DIAGNOSIS — M25561 Pain in right knee: Secondary | ICD-10-CM

## 2018-12-14 DIAGNOSIS — I2510 Atherosclerotic heart disease of native coronary artery without angina pectoris: Secondary | ICD-10-CM

## 2018-12-14 DIAGNOSIS — M25562 Pain in left knee: Secondary | ICD-10-CM

## 2018-12-14 DIAGNOSIS — M5136 Other intervertebral disc degeneration, lumbar region: Secondary | ICD-10-CM

## 2018-12-14 DIAGNOSIS — I480 Paroxysmal atrial fibrillation: Secondary | ICD-10-CM

## 2018-12-14 DIAGNOSIS — Z7901 Long term (current) use of anticoagulants: Secondary | ICD-10-CM

## 2018-12-14 DIAGNOSIS — Z9111 Patient's noncompliance with dietary regimen: Secondary | ICD-10-CM

## 2018-12-14 DIAGNOSIS — F0391 Unspecified dementia with behavioral disturbance: Secondary | ICD-10-CM

## 2018-12-14 DIAGNOSIS — I69398 Other sequelae of cerebral infarction: Secondary | ICD-10-CM | POA: Diagnosis not present

## 2018-12-14 DIAGNOSIS — G8929 Other chronic pain: Secondary | ICD-10-CM

## 2018-12-14 DIAGNOSIS — M1990 Unspecified osteoarthritis, unspecified site: Secondary | ICD-10-CM

## 2018-12-14 DIAGNOSIS — I10 Essential (primary) hypertension: Secondary | ICD-10-CM

## 2018-12-14 DIAGNOSIS — Z7984 Long term (current) use of oral hypoglycemic drugs: Secondary | ICD-10-CM

## 2018-12-14 DIAGNOSIS — E119 Type 2 diabetes mellitus without complications: Secondary | ICD-10-CM | POA: Diagnosis not present

## 2018-12-14 DIAGNOSIS — M6281 Muscle weakness (generalized): Secondary | ICD-10-CM | POA: Diagnosis not present

## 2018-12-16 DIAGNOSIS — M25561 Pain in right knee: Secondary | ICD-10-CM | POA: Diagnosis not present

## 2018-12-16 DIAGNOSIS — I69398 Other sequelae of cerebral infarction: Secondary | ICD-10-CM | POA: Diagnosis not present

## 2018-12-16 DIAGNOSIS — M25562 Pain in left knee: Secondary | ICD-10-CM | POA: Diagnosis not present

## 2018-12-16 DIAGNOSIS — M1990 Unspecified osteoarthritis, unspecified site: Secondary | ICD-10-CM | POA: Diagnosis not present

## 2018-12-16 DIAGNOSIS — G8929 Other chronic pain: Secondary | ICD-10-CM | POA: Diagnosis not present

## 2018-12-16 DIAGNOSIS — M5136 Other intervertebral disc degeneration, lumbar region: Secondary | ICD-10-CM | POA: Diagnosis not present

## 2018-12-21 DIAGNOSIS — G8929 Other chronic pain: Secondary | ICD-10-CM | POA: Diagnosis not present

## 2018-12-21 DIAGNOSIS — M25561 Pain in right knee: Secondary | ICD-10-CM | POA: Diagnosis not present

## 2018-12-21 DIAGNOSIS — M1990 Unspecified osteoarthritis, unspecified site: Secondary | ICD-10-CM | POA: Diagnosis not present

## 2018-12-21 DIAGNOSIS — M25562 Pain in left knee: Secondary | ICD-10-CM | POA: Diagnosis not present

## 2018-12-21 DIAGNOSIS — I69398 Other sequelae of cerebral infarction: Secondary | ICD-10-CM | POA: Diagnosis not present

## 2018-12-21 DIAGNOSIS — M5136 Other intervertebral disc degeneration, lumbar region: Secondary | ICD-10-CM | POA: Diagnosis not present

## 2018-12-22 ENCOUNTER — Encounter: Payer: Self-pay | Admitting: Internal Medicine

## 2018-12-22 ENCOUNTER — Ambulatory Visit (INDEPENDENT_AMBULATORY_CARE_PROVIDER_SITE_OTHER): Payer: Medicare Other | Admitting: Internal Medicine

## 2018-12-22 DIAGNOSIS — R8271 Bacteriuria: Secondary | ICD-10-CM | POA: Diagnosis not present

## 2018-12-22 NOTE — Progress Notes (Signed)
[facility]ious [facility]      Reason for Consult: positive urine culture    Referring Physician: Dr. Quay Burow    Patient ID: [name], female    DOB: 04/25/1925, 85 y.o.   MRN: [id number]  HPI:   She is here for evaluation of a positive urine culture.  She was evaluated by her primary physician during a recent visit.  She has a history of multiple allergies or intolerances including to antibiotics and recent urine culture was scant growth of enterococcus. She has had multiple previous urinalysis tests and urine cultures.  She states that sometimes there is a certain smell but seems to be more related to her dehydration.  She does have occasional nocturia but nothing new, but no associated fever, chills, dysuria, pyuria or other new or concerning urinary symptoms.  No history of urinary stones. Previous record reviewed in Epic and summarized above.   Allergies: clinda, sulfa, nitrofurantoin, penicillins  Past Medical History:  Diagnosis Date  . Abdominal pain   . Allergic rhinitis, cause unspecified   . Anemia B twelve deficiency   . Arrhythmia    paroxysmal atrial fibrillation  . Arthritis    "arms and hands" (06/30/2013)  . Benign neoplasm of colon   . Colon polyp   . Coronary artery disease   . Degeneration of lumbar or lumbosacral intervertebral disc   . Dizziness - giddy   . Edema   . GERD (gastroesophageal reflux disease)   . Hyperlipidemia   . Hypertension   . Iron deficiency anemia, unspecified   . Lumbago   . Pernicious anemia   . Seizures ([place])   . Stroke ([place])   . Symptomatic menopausal or female climacteric states   . Thoracic or lumbosacral neuritis or radiculitis, unspecified   . Type II diabetes mellitus ([place])     Prior to Admission medications   Medication Sig Start Date End Date Taking? Authorizing Provider  acetaminophen (TYLENOL) 325 MG tablet Take 650 mg by mouth every 6 (six) hours as needed for mild pain.    [provider]  amLODipine (NORVASC) 5 MG tablet TAKE 1 TABLET BY MOUTH DAILY PLEASE KEEP UPCOMING APPT IN FEBRUARY FOR FUTURE REFILLS. 03/24/18   Burnell Blanks, MD  atorvastatin (LIPITOR) 40 MG tablet Take 1 tablet daily 03/24/18   Burnell Blanks, MD  divalproex (DEPAKOTE ER) 250 MG 24 hr tablet Take 1 tab in AM, 2 tabs in PM 10/12/18   Cameron Sprang, MD  ELIQUIS 2.5 MG TABS tablet TAKE 1 TABLET BY MOUTH TWICE A DAY 09/14/18   Burnell Blanks, MD  fluticasone (FLONASE) 50 MCG/ACT nasal spray fluticasone propionate 50 mcg/actuation nasal spray,suspension    [provider]  glucose blood (ONE TOUCH ULTRA TEST) test strip USE 1 STRIP TO CHECK GLUCOSE ONCE DAILY DX CODE E11.9. 05/26/18   Binnie Rail, MD  irbesartan (AVAPRO) 150 MG tablet Take 1 tablet (150 mg total) by mouth daily. 11/10/18   Binnie Rail, MD  iron polysaccharides (FERREX 150) 150 MG capsule Ferrex 150 mg iron capsule  TAKE ONE CAPSULE BY MOUTH TWICE A DAY    [provider]  Lancets (ONETOUCH ULTRASOFT) lancets USE 1  TO CHECK GLUCOSE ONCE DAILY 04/17/18   Burns, Claudina Lick, MD  latanoprost (XALATAN) 0.005 % ophthalmic solution latanoprost 0.005 % eye drops  PLACE 1 DROP IN BOTH EYES NIGHTLY    [provider]  levothyroxine (SYNTHROID, LEVOTHROID) 50 MCG  tablet Take 1 tablet (50 mcg total) by mouth daily. 08/04/18   Binnie Rail, MD  metFORMIN (GLUCOPHAGE) 500 MG tablet Take 1 tablet (500 mg total) by mouth 2 (two) times daily with a meal. Take 1 tablet in AM with meal, Take 2 tablets in PM with meal. 12/08/18   Burns, Claudina Lick, MD  metoprolol succinate (TOPROL-XL) 50 MG 24 hr tablet TAKE 1 TABLET BY MOUTH DAILY. TAKE WITH OR IMMEDIATELY FOLLOWING A MEAL. Please keep upcoming appt in March with Dr. Angelena Form. Thanks 11/27/18   Burnell Blanks, MD  nitroGLYCERIN (NITROSTAT) 0.4 MG SL tablet Place 0.4 mg under the tongue every 5 (five) minutes as needed for chest pain. Reported on  12/21/2015    [provider]  Jonetta Speak LANCETS 24O MISC Use 1 lancet to check glucose once daily 01/15/17   Binnie Rail, MD  OVER THE COUNTER MEDICATION Place 1 patch onto the skin daily as needed (pain). Arthritis pain reliever patch    [provider]  pantoprazole (PROTONIX) 20 MG tablet Take 1 tablet (20 mg total) by mouth daily. 02/11/18   Binnie Rail, MD  potassium chloride (K-DUR) 10 MEQ tablet TAKE 1 TABLET BY MOUTH EVERY DAY 12/10/18   Burnell Blanks, MD  sitaGLIPtin (JANUVIA) 100 MG tablet Take 1 tablet (100 mg total) by mouth daily. 11/25/18   Binnie Rail, MD      Social History   Tobacco Use  . Smoking status: Never Smoker  . Smokeless tobacco: Current User    Types: Snuff  Substance Use Topics  . Alcohol use: No    Alcohol/week: 0.0 standard drinks  . Drug use: No    Family History  Problem Relation Age of Onset  . Breast cancer Sister        "behind heart"  . Colon cancer Sister   . Colon cancer Sister   . Breast cancer Daughter   . Breast cancer Daughter   . Breast cancer Daughter   . Kidney cancer Daughter     Review of Systems  Constitutional: negative for fevers, chills and malaise Genitourinary: negative for frequency, dysuria, urinary incontinence and hematuria Integument/breast: negative for rash All other systems reviewed and are negative    Constitutional: in no apparent distress  There were no vitals filed for this visit.  EYES: anicteric ENMT: no thrush Cardiovascular: Cor RRR Respiratory: CTA B; normal respiratory effort Musculoskeletal: no edema Skin: no rashes Neuro: in wheelchair  Labs: Lab Results  Component Value Date   WBC 5.2 11/25/2018   HGB 10.0 (L) 11/25/2018   HCT 31.1 (L) 11/25/2018   MCV 89.9 11/25/2018   PLT 209.0 11/25/2018    Lab Results  Component Value Date   CREATININE 0.87 11/25/2018   BUN 23 11/25/2018   NA 139 11/25/2018   K 3.7 11/25/2018   CL 105 11/25/2018   CO2 26  11/25/2018    Lab Results  Component Value Date   ALT 12 11/25/2018   AST 17 11/25/2018   ALKPHOS 68 11/25/2018   BILITOT 0.6 11/25/2018   INR 1.08 07/22/2015     Assessment: Asymptomatic bacteruria.  She has had multiple urinalyses and cultures in the past with no concerning signs of infection by the history from the patient and her daughter who is with her.  Specifically she does not have any particular fever, chills, leukocytosis associated with urinary symptoms such as new dysuria, new frequency or new nocturia.  From the history  from the patient and review of the record, I do not see any particular history of urinary infection symptoms at the time of the evaluations.  I have discussed with the family that I recommend only checking the urinalysis and settings as above and there is no indication to check a urinalysis without prompting of symptoms and certainly no treatment without particular concerns.  Additionally cultures are not necessary unless there are significant findings on the urinalysis in the setting as above.  Additionally, I discussed that non-specific findings such as smell, color, back pain, confusion, are not indicators to check a urinalysis without above signs.  Patient and daughter voiced their understanding and are in agreement with this plan.  I did discuss hydration.  Plan: 1) observe off of antibiotics.   2) avoid unnecessary testing  Thank you for the referral!  Very nice to meet them and we are available if something changes.

## 2018-12-23 DIAGNOSIS — M25562 Pain in left knee: Secondary | ICD-10-CM | POA: Diagnosis not present

## 2018-12-23 DIAGNOSIS — M25561 Pain in right knee: Secondary | ICD-10-CM | POA: Diagnosis not present

## 2018-12-23 DIAGNOSIS — G8929 Other chronic pain: Secondary | ICD-10-CM | POA: Diagnosis not present

## 2018-12-23 DIAGNOSIS — M5136 Other intervertebral disc degeneration, lumbar region: Secondary | ICD-10-CM | POA: Diagnosis not present

## 2018-12-23 DIAGNOSIS — I69398 Other sequelae of cerebral infarction: Secondary | ICD-10-CM | POA: Diagnosis not present

## 2018-12-23 DIAGNOSIS — M1990 Unspecified osteoarthritis, unspecified site: Secondary | ICD-10-CM | POA: Diagnosis not present

## 2018-12-28 ENCOUNTER — Encounter: Payer: Self-pay | Admitting: Cardiovascular Disease

## 2018-12-28 DIAGNOSIS — M1990 Unspecified osteoarthritis, unspecified site: Secondary | ICD-10-CM | POA: Diagnosis not present

## 2018-12-28 DIAGNOSIS — G8929 Other chronic pain: Secondary | ICD-10-CM | POA: Diagnosis not present

## 2018-12-28 DIAGNOSIS — M25561 Pain in right knee: Secondary | ICD-10-CM | POA: Diagnosis not present

## 2018-12-28 DIAGNOSIS — I69398 Other sequelae of cerebral infarction: Secondary | ICD-10-CM | POA: Diagnosis not present

## 2018-12-28 DIAGNOSIS — M25562 Pain in left knee: Secondary | ICD-10-CM | POA: Diagnosis not present

## 2018-12-28 DIAGNOSIS — M5136 Other intervertebral disc degeneration, lumbar region: Secondary | ICD-10-CM | POA: Diagnosis not present

## 2018-12-30 DIAGNOSIS — I69398 Other sequelae of cerebral infarction: Secondary | ICD-10-CM | POA: Diagnosis not present

## 2018-12-30 DIAGNOSIS — M25561 Pain in right knee: Secondary | ICD-10-CM | POA: Diagnosis not present

## 2018-12-30 DIAGNOSIS — Z7901 Long term (current) use of anticoagulants: Secondary | ICD-10-CM | POA: Diagnosis not present

## 2018-12-30 DIAGNOSIS — E119 Type 2 diabetes mellitus without complications: Secondary | ICD-10-CM | POA: Diagnosis not present

## 2018-12-30 DIAGNOSIS — Z7984 Long term (current) use of oral hypoglycemic drugs: Secondary | ICD-10-CM | POA: Diagnosis not present

## 2018-12-30 DIAGNOSIS — I480 Paroxysmal atrial fibrillation: Secondary | ICD-10-CM | POA: Diagnosis not present

## 2018-12-30 DIAGNOSIS — M1990 Unspecified osteoarthritis, unspecified site: Secondary | ICD-10-CM | POA: Diagnosis not present

## 2018-12-30 DIAGNOSIS — M6281 Muscle weakness (generalized): Secondary | ICD-10-CM | POA: Diagnosis not present

## 2018-12-30 DIAGNOSIS — G8929 Other chronic pain: Secondary | ICD-10-CM | POA: Diagnosis not present

## 2018-12-30 DIAGNOSIS — I10 Essential (primary) hypertension: Secondary | ICD-10-CM | POA: Diagnosis not present

## 2018-12-30 DIAGNOSIS — I2510 Atherosclerotic heart disease of native coronary artery without angina pectoris: Secondary | ICD-10-CM | POA: Diagnosis not present

## 2018-12-30 DIAGNOSIS — M25562 Pain in left knee: Secondary | ICD-10-CM | POA: Diagnosis not present

## 2018-12-30 DIAGNOSIS — F0391 Unspecified dementia with behavioral disturbance: Secondary | ICD-10-CM | POA: Diagnosis not present

## 2018-12-30 DIAGNOSIS — Z9111 Patient's noncompliance with dietary regimen: Secondary | ICD-10-CM | POA: Diagnosis not present

## 2018-12-30 DIAGNOSIS — M5136 Other intervertebral disc degeneration, lumbar region: Secondary | ICD-10-CM | POA: Diagnosis not present

## 2019-01-04 ENCOUNTER — Telehealth: Payer: Self-pay | Admitting: Internal Medicine

## 2019-01-04 DIAGNOSIS — M25562 Pain in left knee: Secondary | ICD-10-CM | POA: Diagnosis not present

## 2019-01-04 DIAGNOSIS — M25561 Pain in right knee: Secondary | ICD-10-CM | POA: Diagnosis not present

## 2019-01-04 DIAGNOSIS — M1990 Unspecified osteoarthritis, unspecified site: Secondary | ICD-10-CM | POA: Diagnosis not present

## 2019-01-04 DIAGNOSIS — M5136 Other intervertebral disc degeneration, lumbar region: Secondary | ICD-10-CM | POA: Diagnosis not present

## 2019-01-04 DIAGNOSIS — G8929 Other chronic pain: Secondary | ICD-10-CM | POA: Diagnosis not present

## 2019-01-04 DIAGNOSIS — I69398 Other sequelae of cerebral infarction: Secondary | ICD-10-CM | POA: Diagnosis not present

## 2019-01-04 NOTE — Telephone Encounter (Signed)
Gave ok for verbal orders.  

## 2019-01-04 NOTE — Telephone Encounter (Unsigned)
Copied from [place] [id number]. Topic: Quick Communication - Home Health Verbal Orders >> Jan 04, 2019 11:29 AM Windy Kalata wrote: Caller/Agency: Shanon Brow from [facility] Number: [phone] Requesting OT/PT/Skilled Nursing/Social Work: Skilled nursing eval for Fluid discharge from the foot Frequency: Evaluation

## 2019-01-04 NOTE — Telephone Encounter (Signed)
Copied from [place] [id number]. Topic: Quick Communication - Home Health Verbal Orders >> Jan 04, 2019 11:29 AM Windy Kalata wrote: Caller/Agency: Shanon Brow from [facility] Number: [phone] Requesting OT/PT/Skilled Nursing/Social Work: Skilled nursing eval for Fluid discharge from the foot Frequency: Evaluation

## 2019-01-05 DIAGNOSIS — I69398 Other sequelae of cerebral infarction: Secondary | ICD-10-CM | POA: Diagnosis not present

## 2019-01-05 DIAGNOSIS — M25562 Pain in left knee: Secondary | ICD-10-CM | POA: Diagnosis not present

## 2019-01-05 DIAGNOSIS — M25561 Pain in right knee: Secondary | ICD-10-CM | POA: Diagnosis not present

## 2019-01-05 DIAGNOSIS — M5136 Other intervertebral disc degeneration, lumbar region: Secondary | ICD-10-CM | POA: Diagnosis not present

## 2019-01-05 DIAGNOSIS — M1990 Unspecified osteoarthritis, unspecified site: Secondary | ICD-10-CM | POA: Diagnosis not present

## 2019-01-05 DIAGNOSIS — G8929 Other chronic pain: Secondary | ICD-10-CM | POA: Diagnosis not present

## 2019-01-06 ENCOUNTER — Telehealth: Payer: Self-pay | Admitting: Internal Medicine

## 2019-01-06 DIAGNOSIS — I69398 Other sequelae of cerebral infarction: Secondary | ICD-10-CM | POA: Diagnosis not present

## 2019-01-06 DIAGNOSIS — M5136 Other intervertebral disc degeneration, lumbar region: Secondary | ICD-10-CM | POA: Diagnosis not present

## 2019-01-06 DIAGNOSIS — G8929 Other chronic pain: Secondary | ICD-10-CM | POA: Diagnosis not present

## 2019-01-06 DIAGNOSIS — M25562 Pain in left knee: Secondary | ICD-10-CM | POA: Diagnosis not present

## 2019-01-06 DIAGNOSIS — M1990 Unspecified osteoarthritis, unspecified site: Secondary | ICD-10-CM | POA: Diagnosis not present

## 2019-01-06 DIAGNOSIS — M25561 Pain in right knee: Secondary | ICD-10-CM | POA: Diagnosis not present

## 2019-01-06 NOTE — Telephone Encounter (Signed)
Gave ok for verbal orders.  

## 2019-01-06 NOTE — Telephone Encounter (Signed)
Copied from [place] [id number]. Topic: Quick Communication - Home Health Verbal Orders >> Jan 06, 2019 11:19 AM Berneta Levins wrote: Caller/Agency: Lottie from [place] Number: [phone], OK to leave a message Requesting OT/PT/Skilled Nursing/Social Work: skilled nursing - swelling and weeping in legs Frequency: 2x a week for 3 weeks

## 2019-01-07 DIAGNOSIS — I69398 Other sequelae of cerebral infarction: Secondary | ICD-10-CM | POA: Diagnosis not present

## 2019-01-07 DIAGNOSIS — M25562 Pain in left knee: Secondary | ICD-10-CM | POA: Diagnosis not present

## 2019-01-07 DIAGNOSIS — M25561 Pain in right knee: Secondary | ICD-10-CM | POA: Diagnosis not present

## 2019-01-07 DIAGNOSIS — G8929 Other chronic pain: Secondary | ICD-10-CM | POA: Diagnosis not present

## 2019-01-07 DIAGNOSIS — M5136 Other intervertebral disc degeneration, lumbar region: Secondary | ICD-10-CM | POA: Diagnosis not present

## 2019-01-07 DIAGNOSIS — M1990 Unspecified osteoarthritis, unspecified site: Secondary | ICD-10-CM | POA: Diagnosis not present

## 2019-01-11 DIAGNOSIS — M25561 Pain in right knee: Secondary | ICD-10-CM | POA: Diagnosis not present

## 2019-01-11 DIAGNOSIS — I69398 Other sequelae of cerebral infarction: Secondary | ICD-10-CM | POA: Diagnosis not present

## 2019-01-11 DIAGNOSIS — M1990 Unspecified osteoarthritis, unspecified site: Secondary | ICD-10-CM | POA: Diagnosis not present

## 2019-01-11 DIAGNOSIS — M5136 Other intervertebral disc degeneration, lumbar region: Secondary | ICD-10-CM | POA: Diagnosis not present

## 2019-01-11 DIAGNOSIS — M25562 Pain in left knee: Secondary | ICD-10-CM | POA: Diagnosis not present

## 2019-01-11 DIAGNOSIS — G8929 Other chronic pain: Secondary | ICD-10-CM | POA: Diagnosis not present

## 2019-01-14 DIAGNOSIS — M5136 Other intervertebral disc degeneration, lumbar region: Secondary | ICD-10-CM | POA: Diagnosis not present

## 2019-01-14 DIAGNOSIS — M25561 Pain in right knee: Secondary | ICD-10-CM | POA: Diagnosis not present

## 2019-01-14 DIAGNOSIS — M25562 Pain in left knee: Secondary | ICD-10-CM | POA: Diagnosis not present

## 2019-01-14 DIAGNOSIS — M1990 Unspecified osteoarthritis, unspecified site: Secondary | ICD-10-CM | POA: Diagnosis not present

## 2019-01-14 DIAGNOSIS — G8929 Other chronic pain: Secondary | ICD-10-CM | POA: Diagnosis not present

## 2019-01-14 DIAGNOSIS — I69398 Other sequelae of cerebral infarction: Secondary | ICD-10-CM | POA: Diagnosis not present

## 2019-01-19 DIAGNOSIS — M25562 Pain in left knee: Secondary | ICD-10-CM | POA: Diagnosis not present

## 2019-01-19 DIAGNOSIS — M5136 Other intervertebral disc degeneration, lumbar region: Secondary | ICD-10-CM | POA: Diagnosis not present

## 2019-01-19 DIAGNOSIS — M1990 Unspecified osteoarthritis, unspecified site: Secondary | ICD-10-CM | POA: Diagnosis not present

## 2019-01-19 DIAGNOSIS — I69398 Other sequelae of cerebral infarction: Secondary | ICD-10-CM | POA: Diagnosis not present

## 2019-01-19 DIAGNOSIS — G8929 Other chronic pain: Secondary | ICD-10-CM | POA: Diagnosis not present

## 2019-01-19 DIAGNOSIS — M25561 Pain in right knee: Secondary | ICD-10-CM | POA: Diagnosis not present

## 2019-01-21 ENCOUNTER — Telehealth: Payer: Self-pay | Admitting: Internal Medicine

## 2019-01-21 DIAGNOSIS — M5136 Other intervertebral disc degeneration, lumbar region: Secondary | ICD-10-CM | POA: Diagnosis not present

## 2019-01-21 DIAGNOSIS — G8929 Other chronic pain: Secondary | ICD-10-CM | POA: Diagnosis not present

## 2019-01-21 DIAGNOSIS — M25561 Pain in right knee: Secondary | ICD-10-CM | POA: Diagnosis not present

## 2019-01-21 DIAGNOSIS — M1990 Unspecified osteoarthritis, unspecified site: Secondary | ICD-10-CM | POA: Diagnosis not present

## 2019-01-21 DIAGNOSIS — I69398 Other sequelae of cerebral infarction: Secondary | ICD-10-CM | POA: Diagnosis not present

## 2019-01-21 DIAGNOSIS — M25562 Pain in left knee: Secondary | ICD-10-CM | POA: Diagnosis not present

## 2019-01-21 NOTE — Progress Notes (Signed)
Chief Complaint  Patient presents with  . Follow-up    CAD    History of Present Illness: 85 yo female with history of CAD, paroxysmal atrial fibrillation, HTN, HLD, prior CVA and syncope who is here today for cardiac follow up. Cardiac cath April 2011 with bare metal stent patent in LAD, moderate disease in mid Circumflex, mild disease RCA. She had not been on anti-coagulation due to advanced age and fall risk but had a stroke in October 2016 and was placed on Eliquis.  Statin restarted after CVA.   She is here today for follow up. The patient denies any chest pain, dyspnea, palpitations, orthopnea, PND, dizziness, near syncope or syncope. She has had lower extremity edema for the last several weeks. Her weight is up 15 lbs over the last year. She admits to eating much more   Primary Care Physician: Binnie Rail, MD  Past Medical History:  Diagnosis Date  . Abdominal pain   . Allergic rhinitis, cause unspecified   . Anemia B twelve deficiency   . Arrhythmia    paroxysmal atrial fibrillation  . Arthritis    "arms and hands" (06/30/2013)  . Benign neoplasm of colon   . Colon polyp   . Coronary artery disease   . Degeneration of lumbar or lumbosacral intervertebral disc   . Dizziness - giddy   . Edema   . GERD (gastroesophageal reflux disease)   . Hyperlipidemia   . Hypertension   . Iron deficiency anemia, unspecified   . Lumbago   . Pernicious anemia   . Seizures ([place])   . Stroke ([place])   . Symptomatic menopausal or female climacteric states   . Thoracic or lumbosacral neuritis or radiculitis, unspecified   . Type II diabetes mellitus ([place])     Past Surgical History:  Procedure Laterality Date  . ABDOMINAL HYSTERECTOMY  1974  . APPENDECTOMY  1974  . CARDIAC CATHETERIZATION    . CARPAL TUNNEL RELEASE Bilateral 1970's  . CATARACT EXTRACTION, BILATERAL Bilateral   . CHOLECYSTECTOMY    . CORONARY ANGIOPLASTY WITH STENT PLACEMENT     "1" (06/30/2013  . LUMBAR DISC  SURGERY      Current Outpatient Medications  Medication Sig Dispense Refill  . acetaminophen (TYLENOL) 325 MG tablet Take 650 mg by mouth every 6 (six) hours as needed for mild pain.    [name] amLODipine (NORVASC) 5 MG tablet TAKE 1 TABLET BY MOUTH DAILY PLEASE KEEP UPCOMING APPT IN FEBRUARY FOR FUTURE REFILLS. 90 tablet 3  . atorvastatin (LIPITOR) 40 MG tablet Take 1 tablet daily 90 tablet 3  . divalproex (DEPAKOTE ER) 250 MG 24 hr tablet Take 1 tab in AM, 2 tabs in PM 270 tablet 3  . ELIQUIS 2.5 MG TABS tablet TAKE 1 TABLET BY MOUTH TWICE A DAY 180 tablet 1  . fluticasone (FLONASE) 50 MCG/ACT nasal spray fluticasone propionate 50 mcg/actuation nasal spray,suspension    . glucose blood (ONE TOUCH ULTRA TEST) test strip USE 1 STRIP TO CHECK GLUCOSE ONCE DAILY DX CODE E11.9. 100 each 3  . irbesartan (AVAPRO) 150 MG tablet Take 1 tablet (150 mg total) by mouth daily. 90 tablet 1  . iron polysaccharides (FERREX 150) 150 MG capsule Ferrex 150 mg iron capsule  TAKE ONE CAPSULE BY MOUTH TWICE A DAY    . Lancets (ONETOUCH ULTRASOFT) lancets USE 1  TO CHECK GLUCOSE ONCE DAILY 100 each 1  . levothyroxine (SYNTHROID, LEVOTHROID) 50 MCG tablet Take 1 tablet (50 mcg  total) by mouth daily. 90 tablet 1  . memantine (NAMENDA) 10 MG tablet Take 1 tablet by mouth 2 (two) times daily.    . metFORMIN (GLUCOPHAGE) 500 MG tablet Take 1 tablet (500 mg total) by mouth 2 (two) times daily with a meal. Take 1 tablet in AM with meal, Take 2 tablets in PM with meal. 180 tablet 1  . metoprolol succinate (TOPROL-XL) 50 MG 24 hr tablet TAKE 1 TABLET BY MOUTH DAILY. TAKE WITH OR IMMEDIATELY FOLLOWING A MEAL. Please keep upcoming appt in March with Dr. Angelena Form. Thanks 90 tablet 0  . nitroGLYCERIN (NITROSTAT) 0.4 MG SL tablet Place 0.4 mg under the tongue every 5 (five) minutes as needed for chest pain. Reported on 12/21/2015    . ONETOUCH DELICA LANCETS 25K MISC Use 1 lancet to check glucose once daily 100 each 1  . OVER THE  COUNTER MEDICATION Place 1 patch onto the skin daily as needed (pain). Arthritis pain reliever patch    . pantoprazole (PROTONIX) 20 MG tablet Take 1 tablet (20 mg total) by mouth daily. 90 tablet 3  . potassium chloride (K-DUR) 10 MEQ tablet TAKE 1 TABLET BY MOUTH EVERY DAY 90 tablet 0  . furosemide (LASIX) 20 MG tablet Take one tablet by mouth daily as needed for swelling 30 tablet 2   No current facility-administered medications for this visit.     Allergies  Allergen Reactions  . Penicillins Swelling    Hy proceed with Cephalosporin use.  . Sulfonamide Derivatives Other (See Comments)    REACTION: "like my head was full of water"  . Albumin (Human) Other (See Comments)    Doesn't remember   . Clindamycin Other (See Comments)    Doesn't remember   . Iodides Hives  . Iodinated Diagnostic Agents Hives  . Sulfa Antibiotics Hives  . Celecoxib Itching and Rash  . Erythromycin Itching and Rash  . Nitrofuran Derivatives Rash  . Nitrofurantoin Itching and Rash  . Piroxicam Other (See Comments)    unknown  . Povidone-Iodine Itching and Rash    Social History   Socioeconomic History  . Marital status: Widowed    Spouse name: Not on file  . Number of children: 8  . Years of education: 10th Grade  . Highest education level: Not on file  Occupational History  . Not on file  Social Needs  . Financial resource strain: Not on file  . Food insecurity:    Worry: Not on file    Inability: Not on file  . Transportation needs:    Medical: Not on file    Non-medical: Not on file  Tobacco Use  . Smoking status: Never Smoker  . Smokeless tobacco: Current User    Types: Snuff  Substance and Sexual Activity  . Alcohol use: No    Alcohol/week: 0.0 standard drinks  . Drug use: No  . Sexual activity: Never  Lifestyle  . Physical activity:    Days per week: Not on file    Minutes per session: Not on file  . Stress: Not on file  Relationships  . Social connections:    Talks on  phone: Not on file    Gets together: Not on file    Attends religious service: Not on file    Active member of club or organization: Not on file    Attends meetings of clubs or organizations: Not on file    Relationship status: Not on file  . Intimate partner violence:  Fear of current or ex partner: Not on file    Emotionally abused: Not on file    Physically abused: Not on file    Forced sexual activity: Not on file  Other Topics Concern  . Not on file  Social History Narrative   Patient lives with daughter in a two story home.   Patient has a 10th grade education.   Patient is retired.   Patient is right handed.    Family History  Problem Relation Age of Onset  . Breast cancer Sister        "behind heart"  . Colon cancer Sister   . Colon cancer Sister   . Breast cancer Daughter   . Breast cancer Daughter   . Breast cancer Daughter   . Kidney cancer Daughter     Review of Systems:  As stated in the HPI and otherwise negative.   BP (!) 146/60   Pulse 74   Ht 5' 1.25" (1.556 m)   Wt 127 lb 6.4 oz (57.8 kg)   SpO2 99%   BMI 23.88 kg/m   Physical Examination:  General: Well developed, well nourished, NAD  HEENT: OP clear, mucus membranes moist  SKIN: warm, dry. No rashes. Neuro: No focal deficits  Musculoskeletal: Muscle strength 5/5 all ext  Psychiatric: Mood and affect normal  Neck: No JVD, no carotid bruits, no thyromegaly, no lymphadenopathy.  Lungs:Clear bilaterally, no wheezes, rhonci, crackles Cardiovascular: Regular rate and rhythm. No murmurs, gallops or rubs. Abdomen:Soft. Bowel sounds present. Non-tender.  Extremities: 1+ bilateral lower extremity edema.   EKG:  EKG is ordered today. The ekg ordered today demonstrates NSR, rate 74 bpm. LVH  Recent Labs: 11/25/2018: ALT 12; BUN 23; Creatinine, Ser 0.87; Hemoglobin 10.0; Platelets 209.0; Potassium 3.7; Sodium 139; TSH 1.26   Lipid Panel    Component Value Date/Time   CHOL 117 11/25/2018 0831    TRIG 87.0 11/25/2018 0831   TRIG 80 09/04/2006 0837   HDL 47.10 11/25/2018 0831   CHOLHDL 2 11/25/2018 0831   VLDL 17.4 11/25/2018 0831   LDLCALC 52 11/25/2018 0831   LDLDIRECT 155.3 08/11/2008 1122     Wt Readings from Last 3 Encounters:  01/22/19 127 lb 6.4 oz (57.8 kg)  11/25/18 125 lb (56.7 kg)  10/12/18 121 lb (54.9 kg)     Other studies Reviewed: Additional studies/ records that were reviewed today include: . Review of the above records demonstrates:    Assessment and Plan:   1. CAD without angina: No chest pain. Will continue statin and beta blocker. She is not on an ASA since she is on Eliquis.      2. Paroxysmal atrial fibrillation: She is in sinus today. Continue beta blocker and Eliquis.      3. HTN: BP is controlled. No changes  4. Hyperlipidemia: LDL is at goal. Lipids followed in primary care. Continue statin.   5. Acute on chronic diastolic CHF: Mild LE edema. Will start Lasix 20 mg daily as needed. Elevate legs. Limit salt intake. Follow daily weights.   Current medicines are reviewed at length with the patient today.  The patient does not have concerns regarding medicines.  The following changes have been made:  no change  Labs/ tests ordered today include:   Orders Placed This Encounter  Procedures  . EKG 12-Lead    Disposition:   FU with me in 12  months  Signed, Lauree Chandler, MD 01/22/2019 8:54 AM     [facility]  [address] [place], [place]  [address] Phone: [phone]; Fax: [phone]

## 2019-01-21 NOTE — Telephone Encounter (Signed)
Ok with you?

## 2019-01-21 NOTE — Telephone Encounter (Signed)
Copied from [place] [id number]. Topic: Quick Communication - See Telephone Encounter >> Jan 21, 2019 12:29 PM Antonieta Iba C wrote: CRM for notification. See Telephone encounter for: 01/21/19.  Almyra Free case manager with [facility] called in to get vo for discharge. Pt will be discharging from home health services today.    CB:   [phone]

## 2019-01-21 NOTE — Telephone Encounter (Signed)
Okay 

## 2019-01-22 ENCOUNTER — Encounter: Payer: Self-pay | Admitting: Cardiovascular Disease

## 2019-01-22 ENCOUNTER — Other Ambulatory Visit: Payer: Self-pay

## 2019-01-22 ENCOUNTER — Ambulatory Visit (INDEPENDENT_AMBULATORY_CARE_PROVIDER_SITE_OTHER): Payer: Medicare Other | Admitting: Internal Medicine

## 2019-01-22 ENCOUNTER — Ambulatory Visit (INDEPENDENT_AMBULATORY_CARE_PROVIDER_SITE_OTHER): Payer: Medicare Other | Admitting: Cardiovascular Disease

## 2019-01-22 ENCOUNTER — Encounter: Payer: Self-pay | Admitting: Internal Medicine

## 2019-01-22 VITALS — BP 146/60 | HR 74 | Ht 61.25 in | Wt 127.4 lb

## 2019-01-22 VITALS — BP 164/78 | HR 64 | Temp 98.9°F | Resp 16 | Ht 61.25 in | Wt 130.1 lb

## 2019-01-22 DIAGNOSIS — E7800 Pure hypercholesterolemia, unspecified: Secondary | ICD-10-CM | POA: Diagnosis not present

## 2019-01-22 DIAGNOSIS — I5033 Acute on chronic diastolic (congestive) heart failure: Secondary | ICD-10-CM | POA: Diagnosis not present

## 2019-01-22 DIAGNOSIS — H9202 Otalgia, left ear: Secondary | ICD-10-CM

## 2019-01-22 DIAGNOSIS — I480 Paroxysmal atrial fibrillation: Secondary | ICD-10-CM | POA: Diagnosis not present

## 2019-01-22 DIAGNOSIS — I2510 Atherosclerotic heart disease of native coronary artery without angina pectoris: Secondary | ICD-10-CM

## 2019-01-22 DIAGNOSIS — I10 Essential (primary) hypertension: Secondary | ICD-10-CM

## 2019-01-22 MED ORDER — FUROSEMIDE 20 MG PO TABS: ORAL_TABLET | ORAL | 2 refills | Status: DC

## 2019-01-22 NOTE — Progress Notes (Signed)
Subjective:    Patient ID: [name], female    DOB: 15-Dec-1924, 85 y.o.   MRN: [id number]  HPI The patient is here for an acute visit.   Left ear pain:  It started the beginning of the week.  It is intermittent.  At time it feels sharp. One time it was associated with chewing hard.  She denies other factors that cause it.   She has some mild nasal congestion, sinus pressure at times and mild occasional headaches.  She denies fever or cough.    Medications and allergies reviewed with patient and updated if appropriate.  Patient Active Problem List   Diagnosis Date Noted  . Asymptomatic bacteriuria 12/22/2018  . Abnormal urine color 11/25/2018  . Hypothyroidism 05/29/2018  . Poor balance 05/26/2018  . Physical deconditioning 05/26/2018  . Generalized weakness 02/23/2018  . Skin tear of right lower leg without complication 43/15/4008  . Left ankle pain 10/03/2017  . Moderate dementia with behavioral disturbance ([place]) 03/21/2017  . Cerebral infarction ([place]) 12/27/2015  . Convulsions ([place]) 09/11/2015  . SVT (supraventricular tachycardia) ([place])   . Fall 08/21/2015  . PAF (paroxysmal atrial fibrillation) ([place])   . Stroke (cerebrum) ([place]) 08/20/2015  . Aphasia   . TIA (transient ischemic attack)   . FTT (failure to thrive) in adult   . Syncope 10/31/2014  . Diabetes mellitus type 2, controlled ([place]) 10/31/2014  . Uncontrolled hypertension 10/31/2014  . Incomplete emptying of bladder 05/17/2014  . Headache 11/26/2013  . Atypical chest pain 06/29/2013  . CAD, NATIVE VESSEL 02/14/2010  . GERD 02/14/2010  . ANEMIA-B12 DEFICIENCY 01/01/2010  . OSTEOARTHRITIS 01/01/2010  . PERSONAL HX COLONIC POLYPS 01/01/2010  . Iron deficiency anemia 12/28/2009  . DIZZINESS AND GIDDINESS 12/28/2009  . Suprapubic abdominal pain 09/13/2009  . Cough 12/13/2008  . Chronic lower back pain 08/11/2008  . KNEE PAIN, RIGHT 02/11/2008  . Dyslipidemia 04/23/2007  . Allergic rhinitis 04/23/2007   . DIVERTICULITIS, HX OF 04/23/2007    Current Outpatient Medications on File Prior to Visit  Medication Sig Dispense Refill  . acetaminophen (TYLENOL) 325 MG tablet Take 650 mg by mouth every 6 (six) hours as needed for mild pain.    [name] amLODipine (NORVASC) 5 MG tablet TAKE 1 TABLET BY MOUTH DAILY PLEASE KEEP UPCOMING APPT IN FEBRUARY FOR FUTURE REFILLS. 90 tablet 3  . atorvastatin (LIPITOR) 40 MG tablet Take 1 tablet daily 90 tablet 3  . divalproex (DEPAKOTE ER) 250 MG 24 hr tablet Take 1 tab in AM, 2 tabs in PM 270 tablet 3  . ELIQUIS 2.5 MG TABS tablet TAKE 1 TABLET BY MOUTH TWICE A DAY 180 tablet 1  . fluticasone (FLONASE) 50 MCG/ACT nasal spray fluticasone propionate 50 mcg/actuation nasal spray,suspension    . furosemide (LASIX) 20 MG tablet Take one tablet by mouth daily as needed for swelling 30 tablet 2  . glucose blood (ONE TOUCH ULTRA TEST) test strip USE 1 STRIP TO CHECK GLUCOSE ONCE DAILY DX CODE E11.9. 100 each 3  . irbesartan (AVAPRO) 150 MG tablet Take 1 tablet (150 mg total) by mouth daily. 90 tablet 1  . iron polysaccharides (FERREX 150) 150 MG capsule Ferrex 150 mg iron capsule  TAKE ONE CAPSULE BY MOUTH TWICE A DAY    . Lancets (ONETOUCH ULTRASOFT) lancets USE 1  TO CHECK GLUCOSE ONCE DAILY 100 each 1  . levothyroxine (SYNTHROID, LEVOTHROID) 50 MCG tablet Take 1 tablet (50 mcg total) by mouth daily. 90 tablet 1  .  memantine (NAMENDA) 10 MG tablet Take 1 tablet by mouth 2 (two) times daily.    . metFORMIN (GLUCOPHAGE) 500 MG tablet Take 1 tablet (500 mg total) by mouth 2 (two) times daily with a meal. Take 1 tablet in AM with meal, Take 2 tablets in PM with meal. 180 tablet 1  . metoprolol succinate (TOPROL-XL) 50 MG 24 hr tablet TAKE 1 TABLET BY MOUTH DAILY. TAKE WITH OR IMMEDIATELY FOLLOWING A MEAL. Please keep upcoming appt in March with Dr. Angelena Form. Thanks 90 tablet 0  . nitroGLYCERIN (NITROSTAT) 0.4 MG SL tablet Place 0.4 mg under the tongue every 5 (five) minutes as  needed for chest pain. Reported on 12/21/2015    . ONETOUCH DELICA LANCETS 16X MISC Use 1 lancet to check glucose once daily 100 each 1  . OVER THE COUNTER MEDICATION Place 1 patch onto the skin daily as needed (pain). Arthritis pain reliever patch    . pantoprazole (PROTONIX) 20 MG tablet Take 1 tablet (20 mg total) by mouth daily. 90 tablet 3  . potassium chloride (K-DUR) 10 MEQ tablet TAKE 1 TABLET BY MOUTH EVERY DAY 90 tablet 0   No current facility-administered medications on file prior to visit.     Past Medical History:  Diagnosis Date  . Abdominal pain   . Allergic rhinitis, cause unspecified   . Anemia B twelve deficiency   . Arrhythmia    paroxysmal atrial fibrillation  . Arthritis    "arms and hands" (06/30/2013)  . Benign neoplasm of colon   . Colon polyp   . Coronary artery disease   . Degeneration of lumbar or lumbosacral intervertebral disc   . Dizziness - giddy   . Edema   . GERD (gastroesophageal reflux disease)   . Hyperlipidemia   . Hypertension   . Iron deficiency anemia, unspecified   . Lumbago   . Pernicious anemia   . Seizures ([place])   . Stroke ([place])   . Symptomatic menopausal or female climacteric states   . Thoracic or lumbosacral neuritis or radiculitis, unspecified   . Type II diabetes mellitus ([place])     Past Surgical History:  Procedure Laterality Date  . ABDOMINAL HYSTERECTOMY  1974  . APPENDECTOMY  1974  . CARDIAC CATHETERIZATION    . CARPAL TUNNEL RELEASE Bilateral 1970's  . CATARACT EXTRACTION, BILATERAL Bilateral   . CHOLECYSTECTOMY    . CORONARY ANGIOPLASTY WITH STENT PLACEMENT     "1" (06/30/2013  . LUMBAR DISC SURGERY      Social History   Socioeconomic History  . Marital status: Widowed    Spouse name: Not on file  . Number of children: 8  . Years of education: 10th Grade  . Highest education level: Not on file  Occupational History  . Not on file  Social Needs  . Financial resource strain: Not on file  . Food insecurity:     Worry: Not on file    Inability: Not on file  . Transportation needs:    Medical: Not on file    Non-medical: Not on file  Tobacco Use  . Smoking status: Never Smoker  . Smokeless tobacco: Current User    Types: Snuff  Substance and Sexual Activity  . Alcohol use: No    Alcohol/week: 0.0 standard drinks  . Drug use: No  . Sexual activity: Never  Lifestyle  . Physical activity:    Days per week: Not on file    Minutes per session: Not on file  .  Stress: Not on file  Relationships  . Social connections:    Talks on phone: Not on file    Gets together: Not on file    Attends religious service: Not on file    Active member of club or organization: Not on file    Attends meetings of clubs or organizations: Not on file    Relationship status: Not on file  Other Topics Concern  . Not on file  Social History Narrative   Patient lives with daughter in a two story home.   Patient has a 10th grade education.   Patient is retired.   Patient is right handed.    Family History  Problem Relation Age of Onset  . Breast cancer Sister        "behind heart"  . Colon cancer Sister   . Colon cancer Sister   . Breast cancer Daughter   . Breast cancer Daughter   . Breast cancer Daughter   . Kidney cancer Daughter     Review of Systems  Constitutional: Negative for chills and fever.  HENT: Positive for congestion (mild) and sinus pressure (at times). Negative for sore throat.   Respiratory: Negative for cough, shortness of breath and wheezing.   Neurological: Positive for headaches (slight at times).       Objective:   Vitals:   01/22/19 1259  BP: (!) 164/78  Pulse: 64  Resp: 16  Temp: 98.9 F (37.2 C)  SpO2: 98%   BP Readings from Last 3 Encounters:  01/22/19 (!) 164/78  01/22/19 (!) 146/60  12/22/18 (!) 178/87   Wt Readings from Last 3 Encounters:  01/22/19 130 lb 1.9 oz (59 kg)  01/22/19 127 lb 6.4 oz (57.8 kg)  11/25/18 125 lb (56.7 kg)   Body mass index is  24.39 kg/m.   Physical Exam Constitutional:      General: She is not in acute distress.    Appearance: Normal appearance. She is not ill-appearing.  HENT:     Head: Normocephalic and atraumatic.     Right Ear: Tympanic membrane, ear canal and external ear normal. There is no impacted cerumen.     Left Ear: Tympanic membrane, ear canal and external ear normal. There is no impacted cerumen.     Nose: Nose normal. No rhinorrhea.     Mouth/Throat:     Mouth: Mucous membranes are moist.     Pharynx: No posterior oropharyngeal erythema.  Cardiovascular:     Rate and Rhythm: Normal rate and regular rhythm.  Pulmonary:     Effort: Pulmonary effort is normal.     Breath sounds: Normal breath sounds. No wheezing or rhonchi.  Skin:    General: Skin is warm and dry.  Neurological:     Mental Status: She is alert.            Assessment & Plan:    See Problem List for Assessment and Plan of chronic medical problems.

## 2019-01-22 NOTE — Patient Instructions (Signed)
Your ear looks good.  You can take mucinex if needed for the congestion.    Your ear pain may be from your TMJ or jaw joint.

## 2019-01-22 NOTE — Assessment & Plan Note (Signed)
Left ear pain intermittent x several days Mild nasal congestion Ear exam normal - no wax Pain occurred once with chewing hard - ? TMJ No pain now  Symptomatic treatment

## 2019-01-22 NOTE — Patient Instructions (Signed)
Medication Instructions:  Your physician has recommended you make the following change in your medication: Start lasix 20 mg by mouth daily for 5 days and then change to daily as needed for swelling.   If you need a refill on your cardiac medications before your next appointment, please call your pharmacy.   Lab work: None If you have labs (blood work) drawn today and your tests are completely normal, you will receive your results only by: [name] MyChart Message (if you have MyChart) OR . A paper copy in the mail If you have any lab test that is abnormal or we need to change your treatment, we will call you to review the results.  Testing/Procedures: none  Follow-Up: At [facility] you and your health needs are our priority.  As part of our continuing mission to provide you with exceptional heart care, we have created designated Provider Care Teams.  These Care Teams include your primary Cardiologist (physician) and Advanced Practice Providers (APPs -  Physician Assistants and Nurse Practitioners) who all work together to provide you with the care you need, when you need it. You will need a follow up appointment in 12 months.  Please call our office 2 months in advance to schedule this appointment.  You may see Lauree Chandler, MD or one of the following Advanced Practice Providers on your designated Care Team:   Ar[place]o, PA-C Melina Copa, PA-C . Ermalinda Barrios, PA-C  Any Other Special Instructions Will Be Listed Below (If Applicable).

## 2019-01-22 NOTE — Telephone Encounter (Signed)
Notified Julie w/MD response../lmb 

## 2019-01-24 ENCOUNTER — Other Ambulatory Visit: Payer: Self-pay | Admitting: Internal Medicine

## 2019-02-04 ENCOUNTER — Ambulatory Visit: Payer: Medicare Other | Admitting: Podiatry

## 2019-02-15 ENCOUNTER — Telehealth: Payer: Self-pay | Admitting: Internal Medicine

## 2019-02-15 NOTE — Telephone Encounter (Signed)
Patients daughter will call back with email and we will complete virtual set up

## 2019-02-15 NOTE — Telephone Encounter (Signed)
Copied from [place] [id number]. Topic: Quick Communication - See Telephone Encounter >> Feb 15, 2019  7:47 AM Robina Ade, Helene Kelp D wrote: CRM for notification. See Telephone encounter for: 02/15/19. Patient daughter Peggye Form called and said that her mother is having a lot of back pain due to her arthritis and wants to know if Dr. Quay Burow will call something in to her pharmacy so she can take. Please call patient or daughter back, thanks.

## 2019-02-15 NOTE — Progress Notes (Signed)
Virtual Visit via Video Note  I connected with [name] on 02/15/19 at  9:00 AM EDT by a video enabled telemedicine application and verified that I am speaking with the correct person using two identifiers.   I discussed the limitations of evaluation and management by telemedicine and the availability of in person appointments. The patient expressed understanding and agreed to proceed.  The patient is currently at home and I am in the office.  Her and her daughter provided the information due to her dementia.    No referring provider.    History of Present Illness: This is an acute visit for back pain.  Chronic lower back pain: She has chronic lower back pain, which has been worse for the past 2 weeks.  Initially the patient stated that she tripped and fell 2 weeks ago and the pain is been more since then, but her daughter advised this was not the case.  She does have some dementia.  Her daughter stated there was no injuries or anything new, but her back pain is just worse.  She has pain in the lower back, particularly on the right side.  She does have pain down the right leg.  She denies any numbness or tingling.  She has been taking 500 mg of Tylenol every 6 hours.  They have also tried Aspercreme, alcohol rub and some other topical medications which do help some.  They are wondering what else they could do for the pain.  She has no other concerns or questions.   Observations/Objective: Appears well in NAD. Sitting comfortably on the couch.  Xray lumbar spine  04/15/2017: IMPRESSION: Multilevel degenerative disc disease greatest at L4-5. There has not been dramatic interval change since the previous study. Given the intermittent radicular symptoms, lumbar spine MRI may be useful if the patient can undergo the procedure.  Assessment and Plan:  See Problem List for Assessment and Plan of chronic medical problems.   Follow Up Instructions:    I discussed the assessment and  treatment plan with the patient. The patient was provided an opportunity to ask questions and all were answered. The patient agreed with the plan and demonstrated an understanding of the instructions.   The patient was advised to call back or seek an in-person evaluation if the symptoms worsen or if the condition fails to improve as anticipated.    Binnie Rail, MD

## 2019-02-15 NOTE — Telephone Encounter (Signed)
Can you see if she is able to do a virtual first please.

## 2019-02-16 ENCOUNTER — Encounter: Payer: Self-pay | Admitting: Internal Medicine

## 2019-02-16 ENCOUNTER — Ambulatory Visit (INDEPENDENT_AMBULATORY_CARE_PROVIDER_SITE_OTHER): Payer: Medicare Other | Admitting: Internal Medicine

## 2019-02-16 DIAGNOSIS — G8929 Other chronic pain: Secondary | ICD-10-CM

## 2019-02-16 DIAGNOSIS — M5440 Lumbago with sciatica, unspecified side: Secondary | ICD-10-CM | POA: Diagnosis not present

## 2019-02-16 DIAGNOSIS — I2510 Atherosclerotic heart disease of native coronary artery without angina pectoris: Secondary | ICD-10-CM | POA: Diagnosis not present

## 2019-02-16 NOTE — Assessment & Plan Note (Signed)
Chronic pain, worse for the past 2 weeks Pain is in the lower back, particularly on the right side and she does have pain down the right leg No numbness or tingling Will increase Tylenol to 1000 mg 3 times a day Continue topical medication, can try lidocaine patches or gel If this is not controlling her pain I will prescribe tramadol.  They will call and let me know.  I did discuss with them side effects of this medication that she should only take it if needed because of those side effects They will try increasing the Tylenol and lidocaine and let me know if her pain is not controlled

## 2019-02-19 ENCOUNTER — Other Ambulatory Visit: Payer: Self-pay | Admitting: Internal Medicine

## 2019-02-21 ENCOUNTER — Other Ambulatory Visit: Payer: Self-pay | Admitting: Cardiovascular Disease

## 2019-02-25 ENCOUNTER — Telehealth: Payer: Self-pay | Admitting: Internal Medicine

## 2019-02-25 NOTE — Telephone Encounter (Signed)
We have received renewal FMLA forms from daughter Dierdre Harness. Forms have been completed & placed in providers box to review and sign.

## 2019-02-26 ENCOUNTER — Other Ambulatory Visit: Payer: Self-pay

## 2019-03-01 ENCOUNTER — Telehealth: Payer: Medicare Other | Admitting: Neurology

## 2019-03-01 ENCOUNTER — Other Ambulatory Visit: Payer: Self-pay

## 2019-03-01 ENCOUNTER — Encounter

## 2019-03-01 DIAGNOSIS — Z0279 Encounter for issue of other medical certificate: Secondary | ICD-10-CM

## 2019-03-01 NOTE — Telephone Encounter (Signed)
Forms have been signed, faxed, sent to scan, &chagred for.  Daughter, Lynn Ito has been informed and original mailed to her.

## 2019-03-02 ENCOUNTER — Encounter: Payer: Self-pay | Admitting: Neurology

## 2019-03-02 ENCOUNTER — Telehealth (INDEPENDENT_AMBULATORY_CARE_PROVIDER_SITE_OTHER): Payer: Medicare Other | Admitting: Neurology

## 2019-03-02 VITALS — BP 134/84 | Ht 61.0 in | Wt 125.0 lb

## 2019-03-02 DIAGNOSIS — F0391 Unspecified dementia with behavioral disturbance: Secondary | ICD-10-CM | POA: Diagnosis not present

## 2019-03-02 DIAGNOSIS — F03B18 Unspecified dementia, moderate, with other behavioral disturbance: Secondary | ICD-10-CM

## 2019-03-02 MED ORDER — DIVALPROEX SODIUM ER 250 MG PO TB24: ORAL_TABLET | ORAL | 3 refills | Status: DC

## 2019-03-02 MED ORDER — MEMANTINE HCL 10 MG PO TABS: 10.0000 mg | ORAL_TABLET | Freq: Two times a day (BID) | ORAL | 3 refills | Status: DC

## 2019-03-02 NOTE — Progress Notes (Signed)
Virtual Visit via Video Note The purpose of this virtual visit is to provide medical care while limiting exposure to the novel coronavirus.    Consent was obtained for video visit:  Yes.   Answered questions that patient had about telehealth interaction:  Yes.   I discussed the limitations, risks, security and privacy concerns of performing an evaluation and management service by telemedicine. I also discussed with the patient that there may be a patient responsible charge related to this service. The patient expressed understanding and agreed to proceed.  Pt location: Home Physician Location: office Name of referring provider:  Binnie Rail, MD I connected with [name] at patients initiation/request on 03/02/2019 at  3:00 PM EDT by video enabled telemedicine application and verified that I am speaking with the correct person using two identifiers. Pt MRN:  [id number] Pt DOB:  1925-09-25 Video Participants:  [name];  Publishing rights manager (daughter)   History of Present Illness:  The patient was last seen in December 2019 for moderate dementia with behavioral disturbance. Her daughter is present during the e-visit to provide additional information. MMSE 15/30 in December 2019. She is on Memantine 10mg  BID(side effects on Donepezil). On her last visit, Depakote dose was increased to 250mg  in AM, 500mg  in PM due to increasing agitation. Daughter reports that this has helped, she still gets a little anxious sometimes, but better than before. She sometimes says her deceased daughters were just in the house, but does not clearly have hallucinations. She states some days she feels pretty good, some days she does not due to her back pain. She states she fell hard in August, her daughter shakes her head and states she has not had any falls. She states the month currently is August. She denies any headaches or dizziness, sleep is good, no wandering. She usually gets around with family  pushing her on her rollator, sometimes her daughter holds her from the front to walk. Appetite is good. No further episodes of loss of consciousness since June 2017.  HPI: This is a pleasant 85 yo RH woman with a history of diabetes, hypertension, CAD, paroxysmal atrial fibrillation, dementia, with a 5-6 year history of recurrent episodes of loss of consciousness and headaches. Prior to hospital admission in August 2016, she had a total of 5 or 6 of these episodes. Once or twice, her daughter has seen her stare, then go out. It appeared she would just go to sleep. She had urinary incontinence at least twice with these. No convulsive activity noted. Episodes would last 5-10 minutes, then she would come to a little confused. She had an episode in January 2015, the the next episode occurred in August 2016. She was brought to the ER on 07/11/15 for severe retro-orbital headaches, BP was noted to be 195/79. She was discharged home with improvement in headache and BP. She was brought back by family the next day for aphasia lasting 30-35 minutes. She was attempting to eat breakfast when she started "babbling" and appeared more confused. The cup she was holding was shaking and beginning to spill. This lasted a few minutes, and as she was improving, she said "why am I talking like this?" No focal weakness noted by family, she was arguing about what she wanted to wear when EMS arrived. She has no recollection of this. Per records, symptoms resolved upon EMS arrival. She was admitted for stroke workup. CBC, BMP were unremarkable except for glucose of 206. I personally reviewed MRI brain without  contrast which did not show acute infarct, there was generalized atrophy and hydrocephalus ex vacuo, moderately advanced chronic microvascular disease. MRA showed intracranial atherosclerosis, no significant stenosis. Echo showed EF of 76-72%, grade 1 diastolic dysfunction, normal left atrium. On her third hospital day, she had an  episode while sitting on a chair, when her whole body became stiff and she was unresponsive for 1-2 minutes. She was lowered to the ground and revived within a couple of sternal rubs. Vital signs were normal. Head CT was unremarkable. On further questioning, family reported previous episodes of transient loss of consciousness where she would suddenly collapse with loss of bladder control. Her wake and drowsy EEG was normal. She was very confused during her hospital stay, "she tried to escape," and was started on Seroquel. She was discharged home back to baseline per family. She was brought back to the ER on 07/22/15 for slurred speech and lethargy, felt to be due to Seroquel, this has since been discontinued with no further similar episodes.   Her 24-hour ambulatory EEG in October 2016 did not show any epileptiform discharges, there was occasional focal slowing over the left temporal region. She was admitted to [facility] for a right subcortical stroke on 08/20/15. She started having slurred speech on 08/18/15 and was brought to the ER the next day where she was evaluated by Neurology with note of normal neurological exam, recent TIA workup, head CT unremarkable. She was discharged home then returned the next day due to weakness, inability to walk, and fall. Family felt left side was weaker. MRI brain showed an acute stroke in the posterior right lentiform nucleus extending superiorly into the centrum semiovale. MRA showed intracranial stenosis. She had some worsening of symptoms in the hospital, and after discussion with family, it was decided to start Eliquis. Her BP medications were adjusted for permissive hypertension.  She feels her memory is pretty good. She lives with her daughter. Family started to notice memory changes several years ago, they administer her medications. They deny any hallucinations. She does not drive. She is taking Aricept 5mg  daily with no side effects. She denies any olfactory/gustatory  hallucinations, deja vu, rising epigastric sensation, focal numbness/tingling/weakness, myoclonic jerks. There is a family history of seizures in her older sister, cousin, and grandson. Otherwise, she had a normal birth and early development. There is no history of febrile convulsions, CNS infections such as meningitis/encephalitis, significant traumatic brain injury, neurosurgical procedures.   Observations/Objective:   Vitals:   03/02/19 1323  BP: 134/84  Weight: 125 lb (56.7 kg)  Height: 5\' 1"  (1.549 m)   GEN:  The patient appears stated age and is in NAD.   Neurological examination:  Orientation: The patient is alert and oriented x 2.  Cranial nerves: There is good facial symmetry. There is no facial hypomimia.  The speech is fluent and clear. Hearing is reduced to conversational tone. Motor: Strength is at least antigravity x 4.  There is no pronator drift. MMSE - Mini Mental State Exam 03/02/2019 10/12/2018 03/23/2018  Orientation to time 0 0 1  Orientation to Place 4 4 4   Registration 3 3 3   Attention/ Calculation 0 0 0  Recall 0 0 0  Language- name 2 objects 2 2 2   Language- repeat 0 1 1  Language- follow 3 step command 2 3 3   Language- read & follow direction 1 1 1   Write a sentence 1 1 1   Copy design 0 0 1  Total score 13 15 17  Assessment and Plan:   This is a pleasant 85 yo RH woman with a history of hypertension, diabetes, CAD, atrial fibrillation, moderate dementia, stroke in October 2016 with residual left hemiparesis, who initially presented for evaluation of possible seizures. She has been having recurrent episodes of loss of consciousness for several years, admitted in August 2016 for transient aphasia, and during her hospital stay had an episode of generalized stiffening and unresponsiveness. She does have risk factors for seizures with dementia and family history of seizures. Convulsive syncope is also a consideration. She had a syncopal episode last June 2017, no  seizure-like activity, with quick return to baseline. Her 24-hour EEG did not show any epileptiform discharges, there was occasional focal slowing over the left temporal region. No further similar spells since June 2017. MMSE today 13/30 (15/30 in 12/209, 17/30 in 03/2018). She is on Memantine 10mg  BID for dementia, no side effects. Depakote has helped with behavioral changes, continue 250mg  in AM, 500mg  in PM. Continue 24/7 care. Follow-up in 6 months, they know to call for any changes.   Follow Up Instructions:   -I discussed the assessment and treatment plan with the patient/daughter. The patient/daughter were provided an opportunity to ask questions and all were answered. The patient/daughter agreed with the plan and demonstrated an understanding of the instructions.   The patient/daughter were advised to call back or seek an in-person evaluation if the symptoms worsen or if the condition fails to improve as anticipated.    Total Time spent in visit with the patient was 25 minutes, of which more than 50% of the time was spent in counseling and/or coordinating care on the above.   Pt understands and agrees with the plan of care outlined.     Cameron Sprang, MD

## 2019-03-08 ENCOUNTER — Other Ambulatory Visit: Payer: Self-pay | Admitting: Cardiovascular Disease

## 2019-03-11 ENCOUNTER — Other Ambulatory Visit: Payer: Self-pay | Admitting: Cardiovascular Disease

## 2019-03-11 MED ORDER — ATORVASTATIN CALCIUM 40 MG PO TABS: ORAL_TABLET | ORAL | 3 refills | Status: AC

## 2019-03-11 MED ORDER — APIXABAN 2.5 MG PO TABS: 2.5000 mg | ORAL_TABLET | Freq: Two times a day (BID) | ORAL | 1 refills | Status: AC

## 2019-03-11 NOTE — Addendum Note (Signed)
Addended by: Brynda Peon on: 03/11/2019 12:12 PM   Modules accepted: Orders

## 2019-03-11 NOTE — Addendum Note (Signed)
Addended by: De Burrs on: 03/11/2019 11:43 AM   Modules accepted: Orders

## 2019-03-11 NOTE — Telephone Encounter (Signed)
Pt is overdue for follow-up with Dr Angelena Form last seen 12/15/17, recall in [place] however COVID-19 at present.  Pt is 89, weight 59kg, pt is on appropriate dosage of Eliquis 2.5mg  BID.  Will refill rx x 6 months given pt age and pandemic at present.

## 2019-03-18 ENCOUNTER — Telehealth: Payer: Self-pay | Admitting: *Deleted

## 2019-03-18 MED ORDER — TRAMADOL HCL 50 MG PO TABS: 50.0000 mg | ORAL_TABLET | Freq: Four times a day (QID) | ORAL | 0 refills | Status: AC | PRN

## 2019-03-18 NOTE — Telephone Encounter (Signed)
Patient's daughter informed of below. She agrees with Tramadol and will call us if patient starts to show signs of side effects.

## 2019-03-18 NOTE — Telephone Encounter (Signed)
Copied from [place] [phone]. Topic: General - Other >> Mar 18, 2019 10:43 AM Oneta Rack wrote: Caller name: Mrs. Lacey Jensen  Relation to pt: daughter  Call back number: [phone] [id number] [place], [address] [place]. [phone] (Phone) [phone] (Fax)  Reason for call:  Patient was seen virtually by PCP on 02/16/2019 for Chronic bilateral low back pain with sciatica, sciatica laterality unspecified and was advised if tylenol didn't work to inform PCP, pain has not improved, level of pain is 12 (10 being the highest) please advise

## 2019-03-18 NOTE — Telephone Encounter (Signed)
We discussed at that time that if her pain was not controlled I would consider prescribing tramadol, which she has taken in the past.  We did discuss possible side effects of that medication and that she should only take it when needed-for severe pain only because of this possible side effects.  Please confirm with them that is what they want to do and I will send in the medication to the pharmacy.

## 2019-03-20 ENCOUNTER — Other Ambulatory Visit: Payer: Self-pay | Admitting: Internal Medicine

## 2019-03-22 ENCOUNTER — Other Ambulatory Visit: Payer: Self-pay | Admitting: Internal Medicine

## 2019-03-22 NOTE — Telephone Encounter (Signed)
Pts daughter aware of response below and will do tylenol arthritis.

## 2019-03-22 NOTE — Telephone Encounter (Signed)
Tramadol does not typically affect sugars, but can cause increase drowsiness.

## 2019-03-22 NOTE — Telephone Encounter (Signed)
Pt's daughter stated that she thinks the Tramadol may be causing an increase in pt's blood sugar and pt seems more tired. She would like to know if this is a side effect of the Tramadol. Please advise. They decided not to give pt medication yesterday. BO#[id number]

## 2019-04-03 ENCOUNTER — Other Ambulatory Visit: Payer: Self-pay | Admitting: Cardiovascular Disease

## 2019-04-12 ENCOUNTER — Other Ambulatory Visit: Payer: Self-pay

## 2019-04-12 ENCOUNTER — Encounter: Payer: Self-pay | Admitting: Podiatry

## 2019-04-12 ENCOUNTER — Ambulatory Visit (INDEPENDENT_AMBULATORY_CARE_PROVIDER_SITE_OTHER): Payer: Medicare Other | Admitting: Podiatry

## 2019-04-12 VITALS — Temp 96.6°F

## 2019-04-12 DIAGNOSIS — E119 Type 2 diabetes mellitus without complications: Secondary | ICD-10-CM

## 2019-04-12 DIAGNOSIS — M79675 Pain in left toe(s): Secondary | ICD-10-CM | POA: Diagnosis not present

## 2019-04-12 DIAGNOSIS — B351 Tinea unguium: Secondary | ICD-10-CM | POA: Diagnosis not present

## 2019-04-12 DIAGNOSIS — M79674 Pain in right toe(s): Secondary | ICD-10-CM

## 2019-04-12 NOTE — Progress Notes (Signed)
Subjective: [name] presents today with painful, thick toenails 1-5 b/l that she cannot cut and which interfere with daily activities.  Pain is aggravated when wearing enclosed shoe gear.  Andrea Rail, MD is her PCP. Last visit 02/16/2019.  Her daughter is present during the visit. She voices no new concerns on today's visit.  [name]'s blood sugar was. 280 mg/dL this morning.  Last A1c was 8.2.  Current Outpatient Medications:  .  acetaminophen (TYLENOL) 325 MG tablet, Take 650 mg by mouth every 6 (six) hours as needed for mild pain., Disp: , Rfl:  .  amLODipine (NORVASC) 5 MG tablet, Take 1 tablet (5 mg total) by mouth daily., Disp: 90 tablet, Rfl: 3 .  apixaban (ELIQUIS) 2.5 MG TABS tablet, Take 1 tablet (2.5 mg total) by mouth 2 (two) times daily., Disp: 180 tablet, Rfl: 1 .  atorvastatin (LIPITOR) 40 MG tablet, TAKE 1 TABLET BY MOUTH EVERY DAY, Disp: 90 tablet, Rfl: 3 .  divalproex (DEPAKOTE ER) 250 MG 24 hr tablet, Take 1 tab in AM, 2 tabs in PM, Disp: 270 tablet, Rfl: 3 .  fluticasone (FLONASE) 50 MCG/ACT nasal spray, fluticasone propionate 50 mcg/actuation nasal spray,suspension, Disp: , Rfl:  .  furosemide (LASIX) 20 MG tablet, Take one tablet by mouth daily as needed for swelling, Disp: 30 tablet, Rfl: 2 .  glucose blood (ONE TOUCH ULTRA TEST) test strip, USE 1 STRIP TO CHECK GLUCOSE ONCE DAILY DX CODE E11.9., Disp: 100 each, Rfl: 3 .  irbesartan (AVAPRO) 150 MG tablet, Take 1 tablet (150 mg total) by mouth daily., Disp: 90 tablet, Rfl: 1 .  iron polysaccharides (FERREX 150) 150 MG capsule, Ferrex 150 mg iron capsule  TAKE ONE CAPSULE BY MOUTH TWICE A DAY, Disp: , Rfl:  .  JANUVIA 100 MG tablet, Take 100 mg by mouth daily., Disp: , Rfl:  .  Lancets (ONETOUCH ULTRASOFT) lancets, USE 1  TO CHECK GLUCOSE ONCE DAILY, Disp: 100 each, Rfl: 1 .  levothyroxine (SYNTHROID, LEVOTHROID) 50 MCG tablet, TAKE 1 TABLET BY MOUTH EVERY DAY, Disp: 90 tablet, Rfl: 1 .  memantine  (NAMENDA) 10 MG tablet, Take 1 tablet (10 mg total) by mouth 2 (two) times daily., Disp: 180 tablet, Rfl: 3 .  metFORMIN (GLUCOPHAGE) 500 MG tablet, Take 1 tablet by mouth in the morning and 2 in the evening with meals. Needs office visit for more refills., Disp: 270 tablet, Rfl: 0 .  metoprolol succinate (TOPROL-XL) 50 MG 24 hr tablet, TAKE 1 TABLET BY MOUTH DAILY. TAKE WITH OR IMMEDIATELY FOLLOWING A MEAL. KEEP UPCOMING APPT IN MARCH, Disp: 90 tablet, Rfl: 2 .  nitroGLYCERIN (NITROSTAT) 0.4 MG SL tablet, Place 0.4 mg under the tongue every 5 (five) minutes as needed for chest pain. Reported on 12/21/2015, Disp: , Rfl:  .  ONETOUCH DELICA LANCETS 22L MISC, Use 1 lancet to check glucose once daily, Disp: 100 each, Rfl: 1 .  OVER THE COUNTER MEDICATION, Place 1 patch onto the skin daily as needed (pain). Arthritis pain reliever patch, Disp: , Rfl:  .  pantoprazole (PROTONIX) 20 MG tablet, TAKE 1 TABLET BY MOUTH EVERY DAY, Disp: 90 tablet, Rfl: 3 .  potassium chloride (K-DUR) 10 MEQ tablet, TAKE 1 TABLET BY MOUTH EVERY DAY, Disp: 90 tablet, Rfl: 3 .  traMADol (ULTRAM) 50 MG tablet, Take 1 tablet (50 mg total) by mouth every 6 (six) hours as needed., Disp: 20 tablet, Rfl: 0  Allergies reviewed. Allergies Penicillin  Clindamycin  Other (See  Comments) High Allergy 08/31/2014   unspecified    Iodides  Hives High Allergy 08/31/2014   Iodinated Diagnostic Agents  Hives High Allergy 05/16/2014   Sulfa Antibiotics  Hives High Allergy 08/31/2014   Albumin (human)  Other (See Comments) Medium Allergy 08/31/2014   unspecified    Albumin Human-interferon Beta-1a[Interferon Beta-1a]   Not Specified  09/26/2014   Chlorpheniramine   Not Specified  05/16/2014   Povidone Iodine  Other (See Comments) Not Specified Allergy    unspecified    Celecoxib  Itching, Rash Low Allergy 08/11/2008   Erythromycin  Itching, Rash Low Allergy 10/09/2010   Nitrofuran Derivatives  Rash Low  08/31/2014   Nitrofurantoin  Itching,  Rash Low Allergy 10/09/2010   Piroxicam  Other (See Comments) Low Unspecified    unknown    Povidone-iodine  Itching, Rash Low Allergy    Adverse Reactions/Drug Intolerances   Vitals:   04/12/19 0819  Temp: (!) 96.6 F (35.9 C)    Vascular Examination: Capillary refill time immediate x 10 digits.  Dorsalis pedis and Posterior tibial pulses palpable b/l.  Digital hair absent x10 digits.  Skin temperature gradient WNL b/l.  Dermatological Examination: Pedal skin is thin, shiny, and atrophic bilaterally.  No open wounds noted bilaterally.  Toenails 1-5 b/l discolored, thick, dystrophic with subungual debris and pain with palpation to nailbeds due to thickness of nails.  Musculoskeletal: Muscle strength 5/5 to all LE muscle groups  No gross bony deformities b/l.  No pain, crepitus or joint limitation noted with ROM.   Neurological: Sensation intact with 10 gram monofilament.  Vibratory sensation intact.  Assessment: Painful onychomycosis toenails 1-5 b/l  NIDDM  Plan: 1. Toenails 1-5 b/l were debrided in length and girth without iatrogenic bleeding. 2. Patient to continue soft, supportive shoe gear daily. 3. Patient to report any pedal injuries to medical professional immediately. 4. Follow up 3 months.  5. Patient/POA to call should there be a concern in the interim.

## 2019-04-12 NOTE — Patient Instructions (Signed)
Diabetes Mellitus and Foot Care  Foot care is an important part of your health, especially when you have diabetes. Diabetes may cause you to have problems because of poor blood flow (circulation) to your feet and legs, which can cause your skin to:   Become thinner and drier.   Break more easily.   Heal more slowly.   Peel and crack.  You may also have nerve damage (neuropathy) in your legs and feet, causing decreased feeling in them. This means that you may not notice minor injuries to your feet that could lead to more serious problems. Noticing and addressing any potential problems early is the best way to prevent future foot problems.  How to care for your feet  Foot hygiene   Wash your feet daily with warm water and mild soap. Do not use hot water. Then, pat your feet and the areas between your toes until they are completely dry. Do not soak your feet as this can dry your skin.   Trim your toenails straight across. Do not dig under them or around the cuticle. File the edges of your nails with an emery board or nail file.   Apply a moisturizing lotion or petroleum jelly to the skin on your feet and to dry, brittle toenails. Use lotion that does not contain alcohol and is unscented. Do not apply lotion between your toes.  Shoes and socks   Wear clean socks or stockings every day. Make sure they are not too tight. Do not wear knee-high stockings since they may decrease blood flow to your legs.   Wear shoes that fit properly and have enough cushioning. Always look in your shoes before you put them on to be sure there are no objects inside.   To break in new shoes, wear them for just a few hours a day. This prevents injuries on your feet.  Wounds, scrapes, corns, and calluses   Check your feet daily for blisters, cuts, bruises, sores, and redness. If you cannot see the bottom of your feet, use a mirror or ask someone for help.   Do not cut corns or calluses or try to remove them with medicine.   If you  find a minor scrape, cut, or break in the skin on your feet, keep it and the skin around it clean and dry. You may clean these areas with mild soap and water. Do not clean the area with peroxide, alcohol, or iodine.   If you have a wound, scrape, corn, or callus on your foot, look at it several times a day to make sure it is healing and not infected. Check for:  ? Redness, swelling, or pain.  ? Fluid or blood.  ? Warmth.  ? Pus or a bad smell.  General instructions   Do not cross your legs. This may decrease blood flow to your feet.   Do not use heating pads or hot water bottles on your feet. They may burn your skin. If you have lost feeling in your feet or legs, you may not know this is happening until it is too late.   Protect your feet from hot and cold by wearing shoes, such as at the beach or on hot pavement.   Schedule a complete foot exam at least once a year (annually) or more often if you have foot problems. If you have foot problems, report any cuts, sores, or bruises to your health care provider immediately.  Contact a health care provider if:     You have a medical condition that increases your risk of infection and you have any cuts, sores, or bruises on your feet.   You have an injury that is not healing.   You have redness on your legs or feet.   You feel burning or tingling in your legs or feet.   You have pain or cramps in your legs and feet.   Your legs or feet are numb.   Your feet always feel cold.   You have pain around a toenail.  Get help right away if:   You have a wound, scrape, corn, or callus on your foot and:  ? You have pain, swelling, or redness that gets worse.  ? You have fluid or blood coming from the wound, scrape, corn, or callus.  ? Your wound, scrape, corn, or callus feels warm to the touch.  ? You have pus or a bad smell coming from the wound, scrape, corn, or callus.  ? You have a fever.  ? You have a red line going up your leg.  Summary   Check your feet every day  for cuts, sores, red spots, swelling, and blisters.   Moisturize feet and legs daily.   Wear shoes that fit properly and have enough cushioning.   If you have foot problems, report any cuts, sores, or bruises to your health care provider immediately.   Schedule a complete foot exam at least once a year (annually) or more often if you have foot problems.  This information is not intended to replace advice given to you by your health care provider. Make sure you discuss any questions you have with your health care provider.  Document Released: 10/25/2000 Document Revised: 12/10/2017 Document Reviewed: 11/29/2016  Elsevier Interactive Patient Education  2019 [employer]vier Inc.

## 2019-04-16 ENCOUNTER — Other Ambulatory Visit: Payer: Self-pay | Admitting: Cardiovascular Disease

## 2019-04-18 ENCOUNTER — Other Ambulatory Visit: Payer: Self-pay | Admitting: Internal Medicine

## 2019-05-06 ENCOUNTER — Ambulatory Visit: Payer: Medicare Other | Admitting: Neurology

## 2019-05-11 ENCOUNTER — Other Ambulatory Visit: Payer: Self-pay | Admitting: Internal Medicine

## 2019-05-13 ENCOUNTER — Ambulatory Visit: Payer: Medicare Other | Admitting: Neurology

## 2019-05-17 ENCOUNTER — Other Ambulatory Visit: Payer: Self-pay | Admitting: Internal Medicine

## 2019-05-18 ENCOUNTER — Telehealth: Payer: Self-pay | Admitting: Internal Medicine

## 2019-05-18 ENCOUNTER — Ambulatory Visit: Payer: Medicare Other | Admitting: Internal Medicine

## 2019-05-18 DIAGNOSIS — M25552 Pain in left hip: Secondary | ICD-10-CM

## 2019-05-18 MED ORDER — POLYSACCHARIDE IRON COMPLEX 150 MG PO CAPS: 150.0000 mg | ORAL_CAPSULE | Freq: Two times a day (BID) | ORAL | 1 refills | Status: AC

## 2019-05-18 NOTE — Telephone Encounter (Signed)
We can try to do an xray here if they think they can get her in - but if she does have an xray and has a fracture she will likely need to go to the hospital to consider surgery...   Can try an xray here  She can go to the ED  We can try a mobile xray to come to her house -- [facility] [phone]

## 2019-05-18 NOTE — Telephone Encounter (Signed)
Ordered.  I printed 2 different things-1 of them should be able to serve as an order.

## 2019-05-18 NOTE — Telephone Encounter (Signed)
Appointment has been made for today 7/7.

## 2019-05-18 NOTE — Telephone Encounter (Signed)
Patient daughter Bonnita Nasuti called and said that she believes that her mother has fracture her hip because she is in a lot of pain. She wants to know what option there is and if pt can have an x-ray done. She can be reached at [phone]. Please return call as soon as possible, thanks.

## 2019-05-18 NOTE — Telephone Encounter (Signed)
FYI

## 2019-05-18 NOTE — Telephone Encounter (Signed)
[facility].Marland KitchenLind Guest

## 2019-05-18 NOTE — Telephone Encounter (Signed)
Rx faxed over to [facility].   Fax # [phone]

## 2019-05-18 NOTE — Telephone Encounter (Signed)
Prefers mobile xray. They just need written orders from you faxed over to them.    Fax # [phone]

## 2019-05-18 NOTE — Telephone Encounter (Signed)
Left hip

## 2019-05-18 NOTE — Addendum Note (Signed)
Addended by: Earnstine Regal on: 05/18/2019 10:57 AM   Modules accepted: Orders

## 2019-05-19 DIAGNOSIS — M25552 Pain in left hip: Secondary | ICD-10-CM | POA: Diagnosis not present

## 2019-05-19 NOTE — Telephone Encounter (Signed)
Patient's daughter called to get the status of the mobile x-ray machine that was supposed to come to home yesterday.  They did not get a call and no one came.  Please advise and call back today at [phone] [facility])

## 2019-05-19 NOTE — Telephone Encounter (Signed)
Called [facility] to see what was going on with the order that was faxed over. I was given the wrong fax number. Given another fax number [phone]. Order has been faxed STAT with my direct number listed to call me back to let me know they received it. I will call them back if I do not hear anything from them. Daughter is aware.

## 2019-05-20 ENCOUNTER — Telehealth: Payer: Self-pay | Admitting: Internal Medicine

## 2019-05-20 NOTE — Telephone Encounter (Signed)
Daughter aware of results and expressed understanding. She will contact her orthopedic doctor to see if she can get her mother in.

## 2019-05-20 NOTE — Telephone Encounter (Signed)
Mobile xray shows no fracture of her hip or pelvic bones.  She does have a lot of arthritis, but no acute injuries.      To further determine the cause of her pain she would need to see orthopedics or sports medicine.  Symptomatic treatment of pain

## 2019-05-26 ENCOUNTER — Ambulatory Visit: Payer: Medicare Other | Admitting: Internal Medicine

## 2019-05-27 ENCOUNTER — Other Ambulatory Visit: Payer: Self-pay | Admitting: Internal Medicine

## 2019-06-08 ENCOUNTER — Other Ambulatory Visit: Payer: Self-pay | Admitting: Internal Medicine

## 2019-06-11 ENCOUNTER — Other Ambulatory Visit: Payer: Self-pay

## 2019-06-11 MED ORDER — GLUCOSE BLOOD VI STRP: ORAL_STRIP | 1 refills | Status: DC

## 2019-06-15 ENCOUNTER — Other Ambulatory Visit: Payer: Self-pay | Admitting: Internal Medicine

## 2019-06-15 ENCOUNTER — Other Ambulatory Visit: Payer: Self-pay

## 2019-06-15 MED ORDER — ONETOUCH ULTRA VI STRP: ORAL_STRIP | 0 refills | Status: DC

## 2019-06-30 ENCOUNTER — Ambulatory Visit: Payer: Self-pay

## 2019-06-30 ENCOUNTER — Emergency Department (HOSPITAL_COMMUNITY)
Admission: EM | Admit: 2019-06-30 | Discharge: 2019-06-30 | Disposition: A | Discharge status: Home / Self Care | Payer: Medicare Other | Attending: Emergency Medicine | Admitting: Emergency Medicine

## 2019-06-30 ENCOUNTER — Other Ambulatory Visit: Payer: Self-pay

## 2019-06-30 ENCOUNTER — Encounter (HOSPITAL_COMMUNITY): Payer: Self-pay | Admitting: Emergency Medicine

## 2019-06-30 DIAGNOSIS — Z7984 Long term (current) use of oral hypoglycemic drugs: Secondary | ICD-10-CM | POA: Insufficient documentation

## 2019-06-30 DIAGNOSIS — M1612 Unilateral primary osteoarthritis, left hip: Secondary | ICD-10-CM | POA: Diagnosis not present

## 2019-06-30 DIAGNOSIS — R404 Transient alteration of awareness: Secondary | ICD-10-CM | POA: Diagnosis not present

## 2019-06-30 DIAGNOSIS — Z79899 Other long term (current) drug therapy: Secondary | ICD-10-CM | POA: Diagnosis not present

## 2019-06-30 DIAGNOSIS — I10 Essential (primary) hypertension: Secondary | ICD-10-CM | POA: Diagnosis not present

## 2019-06-30 DIAGNOSIS — E119 Type 2 diabetes mellitus without complications: Secondary | ICD-10-CM | POA: Insufficient documentation

## 2019-06-30 DIAGNOSIS — G8929 Other chronic pain: Secondary | ICD-10-CM

## 2019-06-30 DIAGNOSIS — Z9114 Patient's other noncompliance with medication regimen: Secondary | ICD-10-CM | POA: Diagnosis not present

## 2019-06-30 DIAGNOSIS — M25552 Pain in left hip: Secondary | ICD-10-CM | POA: Diagnosis present

## 2019-06-30 MED ORDER — DICLOFENAC SODIUM 1 % TD GEL: 4.0000 g | Freq: Four times a day (QID) | TRANSDERMAL | 0 refills | Status: AC

## 2019-06-30 MED ORDER — LIDOCAINE 5 % EX PTCH: 1.0000 | MEDICATED_PATCH | CUTANEOUS | 0 refills | Status: AC

## 2019-06-30 NOTE — ED Triage Notes (Signed)
Pt arrives from home sent by family. Family states that pt has chronic arthritis pain in left groin and leg that was confirmed on xray at home. Family wanted to get this chronic pain check out. Family is unable to get pt to PT bc she is immobile due to a hx of left sided stroke with left-sided deficits.

## 2019-06-30 NOTE — ED Provider Notes (Signed)
[facility] Provider Note   CSN: [id number] Arrival date & time: 06/30/19  1323    History   Chief Complaint Chief Complaint  Patient presents with  . Pain    HPI [name] is a 85 y.o. female past medical history of type 2 diabetes mellitus, CVA, hypertension, osteoarthritis with chronic left hip pain.  Patient presents with daughter.  They report that pain in the left hip has been chronic and patient has been prescribed tramadol however she often does not take this medication as it makes her "loopy".  The pain was worse this morning when patient had x-rays which were taken which showed osteoarthritis.  During an episode of pain, patient had elevated blood pressure per the daughter to 205/105.  Given patient's history of stroke, the daughter is concerned about this high blood pressure.  Daughter also inquiring about getting a steroid injection for patient's hip.  She denies pain in other joints, new back pain, chest pain, shortness of breath, nausea, vomiting, dysuria.     HPI  Past Medical History:  Diagnosis Date  . Abdominal pain   . Allergic rhinitis, cause unspecified   . Anemia B twelve deficiency   . Arrhythmia    paroxysmal atrial fibrillation  . Arthritis    "arms and hands" (06/30/2013)  . Benign neoplasm of colon   . Colon polyp   . Coronary artery disease   . Degeneration of lumbar or lumbosacral intervertebral disc   . Dizziness - giddy   . Edema   . GERD (gastroesophageal reflux disease)   . Hyperlipidemia   . Hypertension   . Iron deficiency anemia, unspecified   . Lumbago   . Pernicious anemia   . Seizures ([place])   . Stroke ([place])   . Symptomatic menopausal or female climacteric states   . Thoracic or lumbosacral neuritis or radiculitis, unspecified   . Type II diabetes mellitus [facility])     Patient Active Problem List   Diagnosis Date Noted  . Ear pain, left 01/22/2019  . Asymptomatic bacteriuria 12/22/2018   . Abnormal urine color 11/25/2018  . Hypothyroidism 05/29/2018  . Poor balance 05/26/2018  . Physical deconditioning 05/26/2018  . Generalized weakness 02/23/2018  . Skin tear of right lower leg without complication 87/56/4332  . Left ankle pain 10/03/2017  . Moderate dementia with behavioral disturbance ([place]) 03/21/2017  . Cerebral infarction ([place]) 12/27/2015  . Convulsions ([place]) 09/11/2015  . SVT (supraventricular tachycardia) ([place])   . Fall 08/21/2015  . PAF (paroxysmal atrial fibrillation) ([place])   . Stroke (cerebrum) ([place]) 08/20/2015  . Aphasia   . TIA (transient ischemic attack)   . FTT (failure to thrive) in adult   . Syncope 10/31/2014  . Diabetes mellitus type 2, controlled ([place]) 10/31/2014  . Uncontrolled hypertension 10/31/2014  . Incomplete emptying of bladder 05/17/2014  . Headache 11/26/2013  . Atypical chest pain 06/29/2013  . CAD, NATIVE VESSEL 02/14/2010  . GERD 02/14/2010  . ANEMIA-B12 DEFICIENCY 01/01/2010  . OSTEOARTHRITIS 01/01/2010  . PERSONAL HX COLONIC POLYPS 01/01/2010  . Iron deficiency anemia 12/28/2009  . DIZZINESS AND GIDDINESS 12/28/2009  . Suprapubic abdominal pain 09/13/2009  . Cough 12/13/2008  . Chronic lower back pain 08/11/2008  . KNEE PAIN, RIGHT 02/11/2008  . Dyslipidemia 04/23/2007  . Allergic rhinitis 04/23/2007  . DIVERTICULITIS, HX OF 04/23/2007    Past Surgical History:  Procedure Laterality Date  . ABDOMINAL HYSTERECTOMY  1974  . APPENDECTOMY  1974  . CARDIAC CATHETERIZATION    .  CARPAL TUNNEL RELEASE Bilateral 1970's  . CATARACT EXTRACTION, BILATERAL Bilateral   . CHOLECYSTECTOMY    . CORONARY ANGIOPLASTY WITH STENT PLACEMENT     "1" (06/30/2013  . LUMBAR DISC SURGERY       OB History   No obstetric history on file.      Home Medications    Prior to Admission medications   Medication Sig Start Date End Date Taking? Authorizing Provider  acetaminophen (TYLENOL) 325 MG tablet Take 650 mg by mouth every 6 (six)  hours as needed for mild pain.    [provider]  amLODipine (NORVASC) 5 MG tablet Take 1 tablet (5 mg total) by mouth daily. 04/06/19   Burnell Blanks, MD  apixaban (ELIQUIS) 2.5 MG TABS tablet Take 1 tablet (2.5 mg total) by mouth 2 (two) times daily. 03/11/19   Burnell Blanks, MD  atorvastatin (LIPITOR) 40 MG tablet TAKE 1 TABLET BY MOUTH EVERY DAY 03/11/19   Burnell Blanks, MD  diclofenac sodium (VOLTAREN) 1 % GEL Apply 4 g topically 4 (four) times daily. 06/30/19   Jeanmarie Hubert, MD  divalproex (DEPAKOTE ER) 250 MG 24 hr tablet Take 1 tab in AM, 2 tabs in PM 03/02/19   Cameron Sprang, MD  fluticasone [facility]) 50 MCG/ACT nasal spray fluticasone propionate 50 mcg/actuation nasal spray,suspension    [provider]  furosemide (LASIX) 20 MG tablet TAKE ONE TABLET BY MOUTH DAILY AS NEEDED FOR SWELLING 04/16/19   Burnell Blanks, MD  glucose blood (ONETOUCH ULTRA) test strip USE 1 STRIP TO CHECK GLUCOSE ONCE DAILY. DX CODE- E11.9 06/15/19   Binnie Rail, MD  irbesartan (AVAPRO) 150 MG tablet Take 1 tablet (150 mg total) by mouth daily. Follow-up appt is due must see provider for future refills 05/28/19   Binnie Rail, MD  iron polysaccharides (FERREX 150) 150 MG capsule Take 1 capsule (150 mg total) by mouth 2 (two) times daily. 05/18/19   Burns, Claudina Lick, MD  JANUVIA 100 MG tablet Take 100 mg by mouth daily. 02/22/19   [provider]  Lancets (ONETOUCH ULTRASOFT) lancets USE 1  TO CHECK GLUCOSE ONCE DAILY 04/17/18   Burns, Claudina Lick, MD  levothyroxine (SYNTHROID, LEVOTHROID) 50 MCG tablet TAKE 1 TABLET BY MOUTH EVERY DAY 01/25/19   Burns, Claudina Lick, MD  lidocaine (LIDODERM) 5 % Place 1 patch onto the skin daily. Remove & Discard patch within 12 hours or as directed by MD 06/30/19   Jeanmarie Hubert, MD  memantine (NAMENDA) 10 MG tablet Take 1 tablet (10 mg total) by mouth 2 (two) times daily. 03/02/19   Cameron Sprang, MD  metFORMIN (GLUCOPHAGE) 500  MG tablet Take 1 tablet by mouth in the morning and 2 in the evening with meals. Needs office visit for more refills. 03/23/19   Binnie Rail, MD  metoprolol succinate (TOPROL-XL) 50 MG 24 hr tablet TAKE 1 TABLET BY MOUTH DAILY. TAKE WITH OR IMMEDIATELY FOLLOWING A MEAL. KEEP UPCOMING APPT IN [facility] 02/22/19   Burnell Blanks, MD  nitroGLYCERIN (NITROSTAT) 0.4 MG SL tablet Place 0.4 mg under the tongue every 5 (five) minutes as needed for chest pain. Reported on 12/21/2015    [provider]  Jonetta Speak LANCETS 16X MISC Use 1 lancet to check glucose once daily 01/15/17   Binnie Rail, MD  OVER THE COUNTER MEDICATION Place 1 patch onto the skin daily as needed (pain). Arthritis pain reliever patch    [provider]  pantoprazole (PROTONIX) 20 MG tablet TAKE 1 TABLET BY MOUTH EVERY DAY 02/22/19   Binnie Rail, MD  potassium chloride (K-DUR) 10 MEQ tablet TAKE 1 TABLET BY MOUTH EVERY DAY 03/09/19   Burnell Blanks, MD  traMADol (ULTRAM) 50 MG tablet Take 1 tablet (50 mg total) by mouth every 6 (six) hours as needed. 03/18/19   Binnie Rail, MD    Family History Family History  Problem Relation Age of Onset  . Breast cancer Sister        "behind heart"  . Colon cancer Sister   . Colon cancer Sister   . Breast cancer Daughter   . Breast cancer Daughter   . Breast cancer Daughter   . Kidney cancer Daughter     Social History Social History   Tobacco Use  . Smoking status: Never Smoker  . Smokeless tobacco: Current User    Types: Snuff  Substance Use Topics  . Alcohol use: No    Alcohol/week: 0.0 standard drinks  . Drug use: No     Allergies   Clindamycin, Iodides, Iodinated diagnostic agents, Penicillins, Sulfa antibiotics, Sulfonamide derivatives, Albumin (human), Albumin human-interferon beta-1a  [interferon beta-1a], Chlorpheniramine, Povidone iodine, Celecoxib, Erythromycin, Nitrofuran derivatives, Nitrofurantoin, Piroxicam, and Povidone-iodine    Review of Systems Review of Systems  Constitutional: Negative for chills and fever.  Respiratory: Negative for cough and shortness of breath.   Cardiovascular: Negative for chest pain.  Gastrointestinal: Negative for abdominal pain, diarrhea, nausea and vomiting.  Genitourinary: Negative for dysuria.  Musculoskeletal: Positive for arthralgias (Hip pain).  All other systems reviewed and are negative.   Physical Exam Updated Vital Signs BP (!) 185/85   Pulse 72   Temp 97.6 F (36.4 C) (Oral)   Resp 17   SpO2 99%   Physical Exam Constitutional:      Appearance: She is well-developed.  HENT:     Head: Normocephalic and atraumatic.  Eyes:     Extraocular Movements: Extraocular movements intact.  Cardiovascular:     Rate and Rhythm: Normal rate and regular rhythm.     Heart sounds: No murmur. No friction rub. No gallop.   Pulmonary:     Breath sounds: Normal breath sounds. No wheezing, rhonchi or rales.  Abdominal:     General: Abdomen is flat. There is no distension.     Palpations: Abdomen is soft. There is no mass.     Tenderness: There is no abdominal tenderness.  Musculoskeletal:        General: Tenderness (Pain in left hip upon hip flexion) present. No swelling.  Skin:    General: Skin is warm and dry.  Neurological:     General: No focal deficit present.     Mental Status: She is alert.     Cranial Nerves: No cranial nerve deficit.     Motor: Weakness: Mild weakness in right leg (from prior stroke), baseline per daughter.  Psychiatric:        Mood and Affect: Mood normal.        Behavior: Behavior normal.     ED Treatments / Results  Labs (all labs ordered are listed, but only abnormal results are displayed) Labs Reviewed - No data to display  EKG None  Radiology No results found.  Procedures Procedures (including critical care time)  Medications Ordered in ED Medications - No data to display   Initial Impression / Assessment and Plan / ED  Course  I have reviewed the triage vital  signs and the nursing notes.  Pertinent labs & imaging results that were available during my care of the patient were reviewed by me and considered in my medical decision making (see chart for details).       [name] is a 85 year old female with past medical history of diabetes, hypertension, stroke who presents with chronic hip pain and inquiring about possible steroid injection and concerns about blood pressure.  Pressure without significant elevation in the emergency room.  Patient without new focal neurological deficit or signs of ischemic chest pain.  Daughter advised to inquire with PCP about joint injection. Patient given prescription for Voltaren gel and lidoderm patches for pain control.  Final Clinical Impressions(s) / ED Diagnoses   Final diagnoses:  Other chronic pain  Primary osteoarthritis of left hip  Essential hypertension    ED Discharge Orders         Ordered    diclofenac sodium (VOLTAREN) 1 % GEL  4 times daily     06/30/19 1547    lidocaine (LIDODERM) 5 %  Every 24 hours     06/30/19 1547           Jeanmarie Hubert, MD 06/30/19 1605    Blanchie Dessert, MD 07/03/19 [phone]

## 2019-06-30 NOTE — Telephone Encounter (Signed)
Patient went to ED

## 2019-06-30 NOTE — Discharge Instructions (Signed)
Today you were seen for hip pain and high blood pressure. Your blood pressure was at a good level when you were in the emergency room but this is probably because you were not in as much pain as you were at home. We have given you a prescription for lidoderm patches as well as voltaren gel. Please take these as instructed. This can help in managing your pain. Please call your primary care provider to setup an appointment to discuss additional strategies for pain control.  Please return to the emergency room for severe uncontrolled pain, shortness of breath, chest pain, fever, or any other concerning symptom  Thank you for allowing [place] to be part of your medical care

## 2019-06-30 NOTE — Telephone Encounter (Signed)
  Pt daughter called stating that her sister called and reported that there mother BP was 205/102 and with recheck 210/97. She states that her mother has taken her medications this AM.  She states her mother has dementia and is fixated on the pain in her hip. She states that her mother has had a stroke in the past. Per protocol Helaina Ms Chieffo's daughter will call 911 to have her mother taken to the hospital for evaluation. Care advice read to daughter she verbalized understanding and will call 911. Reason for Disposition . Sounds like a life-threatening emergency to the triager  Answer Assessment - Initial Assessment Questions 1. BLOOD PRESSURE: "What is the blood pressure?" "Did you take at least two measurements 5 minutes apart?"     205/ 102 210/97 2. ONSET: "When did you take your blood pressure?"     today 3. HOW: "How did you obtain the blood pressure?" (e.g., visiting nurse, automatic home BP monitor)     Home 4. HISTORY: "Do you have a history of high blood pressure?"     yes 5. MEDICATIONS: "Are you taking any medications for blood pressure?" "Have you missed any doses recently?"     No 6. OTHER SYMPTOMS: "Do you have any symptoms?" (e.g., headache, chest pain, blurred vision, difficulty breathing, weakness)     Pain to hip 7. PREGNANCY: "Is there any chance you are pregnant?" "When was your last menstrual period?"     n/a  Protocols used: HIGH BLOOD PRESSURE-A-AH

## 2019-06-30 NOTE — ED Notes (Signed)
Daughter at bedside.

## 2019-07-20 ENCOUNTER — Other Ambulatory Visit: Payer: Self-pay | Admitting: Internal Medicine

## 2019-07-22 ENCOUNTER — Other Ambulatory Visit: Payer: Self-pay | Admitting: Internal Medicine

## 2019-07-26 ENCOUNTER — Telehealth: Payer: Self-pay | Admitting: Neurology

## 2019-07-26 ENCOUNTER — Telehealth: Payer: Self-pay | Admitting: Internal Medicine

## 2019-07-26 DIAGNOSIS — I639 Cerebral infarction, unspecified: Secondary | ICD-10-CM

## 2019-07-26 DIAGNOSIS — I10 Essential (primary) hypertension: Secondary | ICD-10-CM

## 2019-07-26 DIAGNOSIS — F0391 Unspecified dementia with behavioral disturbance: Secondary | ICD-10-CM

## 2019-07-26 DIAGNOSIS — F03B18 Unspecified dementia, moderate, with other behavioral disturbance: Secondary | ICD-10-CM

## 2019-07-26 DIAGNOSIS — R627 Adult failure to thrive: Secondary | ICD-10-CM

## 2019-07-26 DIAGNOSIS — R5381 Other malaise: Secondary | ICD-10-CM

## 2019-07-26 DIAGNOSIS — E1122 Type 2 diabetes mellitus with diabetic chronic kidney disease: Secondary | ICD-10-CM

## 2019-07-26 NOTE — Telephone Encounter (Signed)
Patient's daughter, Luanna Cole, called and said her mom's condition has worsened from a 5 to a 10 on a scale from 1-10.   She requested to move her mom's appointment up from 10/13/2019 with Dr. Delice Lesch to sooner for an in-person appointment. I've added her to a wait list at this time.

## 2019-07-26 NOTE — Telephone Encounter (Signed)
Please put order in. Son states that he talked to [employer] and they said they will cover it.

## 2019-07-26 NOTE — Telephone Encounter (Signed)
This will not be covered by insurance - he can call her insurance to confirm but these services are not covered.

## 2019-07-26 NOTE — Telephone Encounter (Signed)
Pt's son in law Tilda Burrow) called to request [facility] to assist pt with basic care needs (ie: bathing, feeding, dressing) Please advise.  Jeneen Rinks [phone]

## 2019-07-26 NOTE — Telephone Encounter (Signed)
Is this PCS?

## 2019-07-26 NOTE — Telephone Encounter (Signed)
Pt has become more agitated and continuously repeats things.  No longer taking Memantine. Continues to take Depakote one in am and two in pm.  Does not want virtual visit. OV scheduled for 08/04/19 at 4. Instructed to come at 3:45

## 2019-07-26 NOTE — Telephone Encounter (Signed)
ordered

## 2019-08-03 ENCOUNTER — Other Ambulatory Visit: Payer: Self-pay

## 2019-08-03 ENCOUNTER — Encounter: Payer: Self-pay | Admitting: Neurology

## 2019-08-03 ENCOUNTER — Telehealth (INDEPENDENT_AMBULATORY_CARE_PROVIDER_SITE_OTHER): Payer: Medicare Other | Admitting: Neurology

## 2019-08-03 VITALS — Ht 60.0 in | Wt 135.0 lb

## 2019-08-03 DIAGNOSIS — F0391 Unspecified dementia with behavioral disturbance: Secondary | ICD-10-CM | POA: Diagnosis not present

## 2019-08-03 DIAGNOSIS — F03B18 Unspecified dementia, moderate, with other behavioral disturbance: Secondary | ICD-10-CM

## 2019-08-03 MED ORDER — TRAZODONE HCL 50 MG PO TABS: ORAL_TABLET | ORAL | 6 refills | Status: DC

## 2019-08-03 MED ORDER — DIVALPROEX SODIUM ER 250 MG PO TB24: ORAL_TABLET | ORAL | 3 refills | Status: AC

## 2019-08-03 NOTE — Progress Notes (Signed)
Virtual Visit via Video Note The purpose of this virtual visit is to provide medical care while limiting exposure to the novel coronavirus.    Consent was obtained for video visit:  Yes.   Answered questions that patient had about telehealth interaction:  Yes.   I discussed the limitations, risks, security and privacy concerns of performing an evaluation and management service by telemedicine. I also discussed with the patient that there may be a patient responsible charge related to this service. The patient expressed understanding and agreed to proceed.  Pt location: Home Physician Location: office Name of referring provider:  Binnie Rail, MD I connected with Auburn Clavijo at patients initiation/request on 08/03/2019 at 11:30 AM EDT by video enabled telemedicine application and verified that I am speaking with the correct person using two identifiers. Pt MRN:  [id number] Pt DOB:  03-May-1925 Video Participants:  [name];  Publishing rights manager (daughter), Bernadene Person (daughter)   History of Present Illness:  The patient was seen as a virtual video visit on 08/03/2019. She was last seen 5 months ago for moderate dementia with behavioral disturbance. Since her last visit, her family called to report that her sleep patterns have changed. She was in the ER last month due to pain, which elevated her BP to 205/105. This has also caused a lot of anxiety and sleep difficulties, she wakes up every 2 hours and keeps the family up talking gibberish all night. She is convinced her deceased husband and sister are in the house. The change in seasons have worsened her confusion. Appetite is good. She had good response in the past to Depakote for the hallucinations. No side effects. Her daughter stopped Namenda because they felt it was not doing anything. She is mostly in the chair or in bed, and needs assistance for transfers. No further episodes of loss of consciousness since June 2017.  HPI:  This is a pleasant 85 yo RH woman with a history of diabetes, hypertension, CAD, paroxysmal atrial fibrillation, dementia, with a 5-6 year history of recurrent episodes of loss of consciousness and headaches. Prior to hospital admission in August 2016, she had a total of 5 or 6 of these episodes. Once or twice, her daughter has seen her stare, then go out. It appeared she would just go to sleep. She had urinary incontinence at least twice with these. No convulsive activity noted. Episodes would last 5-10 minutes, then she would come to a little confused. She had an episode in January 2015, the the next episode occurred in August 2016. She was brought to the ER on 07/11/15 for severe retro-orbital headaches, BP was noted to be 195/79. She was discharged home with improvement in headache and BP. She was brought back by family the next day for aphasia lasting 30-35 minutes. She was attempting to eat breakfast when she started "babbling" and appeared more confused. The cup she was holding was shaking and beginning to spill. This lasted a few minutes, and as she was improving, she said "why am I talking like this?" No focal weakness noted by family, she was arguing about what she wanted to wear when EMS arrived. She has no recollection of this. Per records, symptoms resolved upon EMS arrival. She was admitted for stroke workup. CBC, BMP were unremarkable except for glucose of 206. I personally reviewed MRI brain without contrast which did not show acute infarct, there was generalized atrophy and hydrocephalus ex vacuo, moderately advanced chronic microvascular disease. MRA showed intracranial atherosclerosis, no  significant stenosis. Echo showed EF of 0000000, grade 1 diastolic dysfunction, normal left atrium. On her third hospital day, she had an episode while sitting on a chair, when her whole body became stiff and she was unresponsive for 1-2 minutes. She was lowered to the ground and revived within a couple of sternal  rubs. Vital signs were normal. Head CT was unremarkable. On further questioning, family reported previous episodes of transient loss of consciousness where she would suddenly collapse with loss of bladder control. Her wake and drowsy EEG was normal. She was very confused during her hospital stay, "she tried to escape," and was started on Seroquel. She was discharged home back to baseline per family. She was brought back to the ER on 07/22/15 for slurred speech and lethargy, felt to be due to Seroquel, this has since been discontinued with no further similar episodes.   Her 24-hour ambulatory EEG in October 2016 did not show any epileptiform discharges, there was occasional focal slowing over the left temporal region. She was admitted to [facility] for a right subcortical stroke on 08/20/15. She started having slurred speech on 08/18/15 and was brought to the ER the next day where she was evaluated by Neurology with note of normal neurological exam, recent TIA workup, head CT unremarkable. She was discharged home then returned the next day due to weakness, inability to walk, and fall. Family felt left side was weaker. MRI brain showed an acute stroke in the posterior right lentiform nucleus extending superiorly into the centrum semiovale. MRA showed intracranial stenosis. She had some worsening of symptoms in the hospital, and after discussion with family, it was decided to start Eliquis. Her BP medications were adjusted for permissive hypertension.  She feels her memory is pretty good. She lives with her daughter. Family started to notice memory changes several years ago, they administer her medications. They deny any hallucinations. She does not drive. She is taking Aricept 5mg  daily with no side effects. She denies any olfactory/gustatory hallucinations, deja vu, rising epigastric sensation, focal numbness/tingling/weakness, myoclonic jerks. There is a family history of seizures in her older sister, cousin, and  grandson. Otherwise, she had a normal birth and early development. There is no history of febrile convulsions, CNS infections such as meningitis/encephalitis, significant traumatic brain injury, neurosurgical procedures.   Observations/Objective:   Vitals:   08/03/19 0824  Weight: 135 lb (61.2 kg)  Height: 5' (1.524 m)   GEN:  The patient appears stated age and is in NAD.   Neurological examination: Patient is awake, alert, oriented to person. States it is December 2008 or 2009. No aphasia or dysarthria, with minimal verbal output. Reduced fluency, able to follow commands. Cranial nerves: Extraocular movements intact with no nystagmus. No facial asymmetry. Motor: moves all extremities symmetrically, at least anti-gravity x 4. No incoordination on finger to nose testing. 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 She has been having recurrent episodes of loss of consciousness for several years, admitted in August 2016 for transient aphasia, and during her hospital stay had an episode of generalized stiffening and unresponsiveness. She does have risk factors for seizures with dementia and family history of seizures. Convulsive syncope is also a consideration. She had a syncopal episode last June 2017, no seizure-like activity, with quick return to baseline.  Her 24-hour EEG did not show any epileptiform discharges, there was occasional focal slowing over the left temporal region. No further similar spells since June 2017. Family reporting more sleep difficulties and more delusions. Increase Depakote 250mg  to 2 tabs BID. They would also like to try low dose Trazodone 50mg  1/2 tab qhs to help her sleep better, may increase to 1 tab qhs if no side effects. Side effects  discussed. We may increase dose as tolerated and if necessary. Continue 24/7 care. Follow-up as scheduled in December, they know to call for any changes.   Follow Up Instructions:   -I discussed the assessment and treatment plan with the patient/daughter. The patient/daughter were provided an opportunity to ask questions and all were answered. The patient/daughter agreed with the plan and demonstrated an understanding of the instructions.   The patient/daughter were advised to call back or seek an in-person evaluation if the symptoms worsen or if the condition fails to improve as anticipated.    Cameron Sprang, MD

## 2019-08-04 ENCOUNTER — Ambulatory Visit: Payer: Medicare Other | Admitting: Neurology

## 2019-08-05 ENCOUNTER — Telehealth: Payer: Self-pay | Admitting: Neurology

## 2019-08-05 ENCOUNTER — Telehealth: Payer: Self-pay | Admitting: Internal Medicine

## 2019-08-05 ENCOUNTER — Telehealth: Payer: Self-pay

## 2019-08-05 ENCOUNTER — Other Ambulatory Visit: Payer: Self-pay

## 2019-08-05 DIAGNOSIS — G8929 Other chronic pain: Secondary | ICD-10-CM

## 2019-08-05 NOTE — Progress Notes (Signed)
Referral sent 

## 2019-08-05 NOTE — Telephone Encounter (Signed)
That is fine, thanks 

## 2019-08-05 NOTE — Telephone Encounter (Signed)
Referral made and faxed attn Dian Situ to [facility] at [phone].  Pt requires this service to have a home health aid to assist with ADL's per Dian Situ.

## 2019-08-05 NOTE — Telephone Encounter (Signed)
It looks like Dr Delice Lesch placed a referral yesterday for [facility]

## 2019-08-05 NOTE — Telephone Encounter (Signed)
Patient son-in-law called and would like to speak with Dr. Quay Burow or her CMA regarding a referral for home health for patient. He stated that he has 3 locations they would like to work with. Please call patient son in law, thanks.

## 2019-08-05 NOTE — Telephone Encounter (Signed)
Patient's daughter called regarding her mom's sleeping pills giving her Diarrhea. She said she has had it several times today. Please Call. Thanks

## 2019-08-05 NOTE — Telephone Encounter (Signed)
Drew from [facility] request that we put in an order for physical therapy with diagnosis of back pain.  Per Dian Situ the pt is requesting and Aide to come out and help with ADL's. To get this covered with pts insurance pt will need another service ordered.  Is this ok with you Dr. Delice Lesch?

## 2019-08-06 NOTE — Telephone Encounter (Signed)
Shirley informed. She will call next week with an update.

## 2019-08-06 NOTE — Telephone Encounter (Signed)
Pt started Trazodone this week. Pt also started having diarrhea this week. Having multiple BM's per day. No bleeding. Daughter Enid Derry decided to not give pt the medication last night and pt has only 2 small BM's today. She would like to stop Trazodone and try something else. Nothing strong. Asked daughter if she thought it was the Depakote and she believes it is not because pt has been on that medication for years.

## 2019-08-06 NOTE — Telephone Encounter (Signed)
Stop Trazodone first and see how she does over the weekend. If no more diarrhea on Monday, we can start a different med. Thanks

## 2019-08-06 NOTE — Telephone Encounter (Signed)
Patient's daughter called again to see if the nurse was available.

## 2019-08-06 NOTE — Telephone Encounter (Signed)
Spoke with son to let him know response. Will contact Dr. Delice Lesch with any questions or concerns with the referral.

## 2019-08-09 NOTE — Telephone Encounter (Signed)
[name] called to let Dr. Delice Lesch know that the Diarrhea has stopped. Thank you

## 2019-08-10 ENCOUNTER — Telehealth: Payer: Self-pay | Admitting: Neurology

## 2019-08-10 ENCOUNTER — Telehealth: Payer: Self-pay | Admitting: Internal Medicine

## 2019-08-10 NOTE — Telephone Encounter (Signed)
[name] with [facility] calling and states that she went out to do the physical therapy evaluation and the patient does not qualify for home health. States that the patient's family are wanting personal care services and [facility] does not offer this. Please advise.

## 2019-08-10 NOTE — Telephone Encounter (Signed)
VM about medication not being at the pharm. Did not give medication name or pharm info on VM. I'm Sorry. Thanks!

## 2019-08-11 NOTE — Telephone Encounter (Signed)
Patient's daughter called and said her mom needs a new medication, instead of the Trazodone that was causing side-effects, sent in to the pharmacy to help with sleep. She said her mom hardly slept at all last night and she'd hoping to pick it up today.  [facility] on [address]

## 2019-08-11 NOTE — Telephone Encounter (Signed)
Let's try mirtazapine 15mg  qhs and see if this helps better. There is room to increase if she does not have side effects, but with her age I would be careful with medications. Main side effect is drowsiness. Call our office in 1 week for an update. Thanks

## 2019-08-11 NOTE — Telephone Encounter (Signed)
Ok to change medication?

## 2019-08-11 NOTE — Telephone Encounter (Signed)
Spoke with shirley she was informed of provider response. Rx sent to pharmacy

## 2019-08-11 NOTE — Telephone Encounter (Signed)
Noted  

## 2019-08-12 ENCOUNTER — Ambulatory Visit (INDEPENDENT_AMBULATORY_CARE_PROVIDER_SITE_OTHER): Payer: Medicare Other

## 2019-08-12 ENCOUNTER — Telehealth: Payer: Self-pay | Admitting: Neurology

## 2019-08-12 ENCOUNTER — Other Ambulatory Visit: Payer: Self-pay | Admitting: *Deleted

## 2019-08-12 DIAGNOSIS — F0391 Unspecified dementia with behavioral disturbance: Secondary | ICD-10-CM

## 2019-08-12 DIAGNOSIS — Z23 Encounter for immunization: Secondary | ICD-10-CM

## 2019-08-12 DIAGNOSIS — F03B18 Unspecified dementia, moderate, with other behavioral disturbance: Secondary | ICD-10-CM

## 2019-08-12 MED ORDER — MIRTAZAPINE 15 MG PO TABS: 15.0000 mg | ORAL_TABLET | Freq: Every day | ORAL | 1 refills | Status: DC

## 2019-08-12 NOTE — Telephone Encounter (Signed)
Patient's daughter, Enid Derry, called to say the pharmacy does not have the prescription. She said she'd come and pick up a hard copy, if needed.

## 2019-08-12 NOTE — Telephone Encounter (Signed)
Called and spoke to [name] and told her that her mom's Mirtazapine 15mg  QHS is at [facility]. Called pharmacist and verified it was ready.   Informed [name] main side effect is drowsiness and to call us in a week with an update how her mom is doing. She stated she would.

## 2019-08-12 NOTE — Telephone Encounter (Signed)
Patient's daughter called regarding her mother [name]'s medication and that she has called the office the past 4 days. Please see previous phone notes. Thanks

## 2019-08-16 ENCOUNTER — Telehealth: Payer: Self-pay | Admitting: Internal Medicine

## 2019-08-16 DIAGNOSIS — R627 Adult failure to thrive: Secondary | ICD-10-CM

## 2019-08-16 DIAGNOSIS — F0391 Unspecified dementia with behavioral disturbance: Secondary | ICD-10-CM

## 2019-08-16 DIAGNOSIS — F03B18 Unspecified dementia, moderate, with other behavioral disturbance: Secondary | ICD-10-CM

## 2019-08-16 DIAGNOSIS — I639 Cerebral infarction, unspecified: Secondary | ICD-10-CM

## 2019-08-16 NOTE — Telephone Encounter (Signed)
Spoke with daughter of patient to let her know that referral has been put in to hospice per daughters request. She will let son in law know.

## 2019-08-16 NOTE — Telephone Encounter (Signed)
Pt son in law Jeneen Rinks is calling and he would like assistant with his mother in [employer]. They are needing home health care assistant. Pt needs help with bathing, dressing, feeding and medication and day to day care. Pt can no longer walk etc. Would like  Monday -Friday in morning assistant only. [employer] insurance. There are 3 home health agency in the network  [facility] is in network with [employer] phone number [phone] to [facility] and [facility] phone  [phone] and [facility] home care [phone]. Pt son would like dr burns to choose which agency they should go with and also to do the referral

## 2019-08-16 NOTE — Telephone Encounter (Signed)
Copied from [place] [id number]. Topic: General - Other >> Aug 16, 2019  8:15 AM Celene Kras A wrote: Reason for CRM: Pts daughter called and is requesting to know if PCP could recommend hospice care for pt. She states she is 90% bed ridden and it is getting almost impossible to move pt. She states the pt is at home due to covid but they are having difficulty and are requesting assistance. Please advise.

## 2019-08-16 NOTE — Telephone Encounter (Signed)
Referral ordered

## 2019-08-16 NOTE — Telephone Encounter (Signed)
Please advise 

## 2019-08-16 NOTE — Telephone Encounter (Signed)
Spoke with daughter and advised of referral.

## 2019-08-17 ENCOUNTER — Other Ambulatory Visit: Payer: Self-pay | Admitting: Internal Medicine

## 2019-08-19 ENCOUNTER — Other Ambulatory Visit: Payer: Self-pay | Admitting: Internal Medicine

## 2019-08-21 DIAGNOSIS — E1159 Type 2 diabetes mellitus with other circulatory complications: Secondary | ICD-10-CM | POA: Diagnosis not present

## 2019-08-21 DIAGNOSIS — G309 Alzheimer's disease, unspecified: Secondary | ICD-10-CM | POA: Diagnosis not present

## 2019-08-21 DIAGNOSIS — I10 Essential (primary) hypertension: Secondary | ICD-10-CM | POA: Diagnosis not present

## 2019-08-21 DIAGNOSIS — I482 Chronic atrial fibrillation, unspecified: Secondary | ICD-10-CM | POA: Diagnosis not present

## 2019-08-21 DIAGNOSIS — R1310 Dysphagia, unspecified: Secondary | ICD-10-CM | POA: Diagnosis not present

## 2019-08-21 DIAGNOSIS — E039 Hypothyroidism, unspecified: Secondary | ICD-10-CM | POA: Diagnosis not present

## 2019-08-21 DIAGNOSIS — L89151 Pressure ulcer of sacral region, stage 1: Secondary | ICD-10-CM | POA: Diagnosis not present

## 2019-08-21 DIAGNOSIS — K219 Gastro-esophageal reflux disease without esophagitis: Secondary | ICD-10-CM | POA: Diagnosis not present

## 2019-08-21 DIAGNOSIS — E785 Hyperlipidemia, unspecified: Secondary | ICD-10-CM | POA: Diagnosis not present

## 2019-08-21 DIAGNOSIS — G9340 Encephalopathy, unspecified: Secondary | ICD-10-CM | POA: Diagnosis not present

## 2019-08-21 DIAGNOSIS — F0280 Dementia in other diseases classified elsewhere without behavioral disturbance: Secondary | ICD-10-CM | POA: Diagnosis not present

## 2019-08-21 DIAGNOSIS — I2510 Atherosclerotic heart disease of native coronary artery without angina pectoris: Secondary | ICD-10-CM | POA: Diagnosis not present

## 2019-08-21 DIAGNOSIS — Z8673 Personal history of transient ischemic attack (TIA), and cerebral infarction without residual deficits: Secondary | ICD-10-CM | POA: Diagnosis not present

## 2019-08-23 DIAGNOSIS — G9340 Encephalopathy, unspecified: Secondary | ICD-10-CM | POA: Diagnosis not present

## 2019-08-23 DIAGNOSIS — G309 Alzheimer's disease, unspecified: Secondary | ICD-10-CM | POA: Diagnosis not present

## 2019-08-23 DIAGNOSIS — I2510 Atherosclerotic heart disease of native coronary artery without angina pectoris: Secondary | ICD-10-CM | POA: Diagnosis not present

## 2019-08-23 DIAGNOSIS — L89151 Pressure ulcer of sacral region, stage 1: Secondary | ICD-10-CM | POA: Diagnosis not present

## 2019-08-23 DIAGNOSIS — R1310 Dysphagia, unspecified: Secondary | ICD-10-CM | POA: Diagnosis not present

## 2019-08-23 DIAGNOSIS — F0280 Dementia in other diseases classified elsewhere without behavioral disturbance: Secondary | ICD-10-CM | POA: Diagnosis not present

## 2019-08-25 DIAGNOSIS — I2510 Atherosclerotic heart disease of native coronary artery without angina pectoris: Secondary | ICD-10-CM | POA: Diagnosis not present

## 2019-08-25 DIAGNOSIS — L89151 Pressure ulcer of sacral region, stage 1: Secondary | ICD-10-CM | POA: Diagnosis not present

## 2019-08-25 DIAGNOSIS — R1310 Dysphagia, unspecified: Secondary | ICD-10-CM | POA: Diagnosis not present

## 2019-08-25 DIAGNOSIS — F0280 Dementia in other diseases classified elsewhere without behavioral disturbance: Secondary | ICD-10-CM | POA: Diagnosis not present

## 2019-08-25 DIAGNOSIS — G309 Alzheimer's disease, unspecified: Secondary | ICD-10-CM | POA: Diagnosis not present

## 2019-08-25 DIAGNOSIS — G9340 Encephalopathy, unspecified: Secondary | ICD-10-CM | POA: Diagnosis not present

## 2019-08-27 DIAGNOSIS — G309 Alzheimer's disease, unspecified: Secondary | ICD-10-CM | POA: Diagnosis not present

## 2019-08-27 DIAGNOSIS — G9340 Encephalopathy, unspecified: Secondary | ICD-10-CM | POA: Diagnosis not present

## 2019-08-27 DIAGNOSIS — F0280 Dementia in other diseases classified elsewhere without behavioral disturbance: Secondary | ICD-10-CM | POA: Diagnosis not present

## 2019-08-27 DIAGNOSIS — I2510 Atherosclerotic heart disease of native coronary artery without angina pectoris: Secondary | ICD-10-CM | POA: Diagnosis not present

## 2019-08-27 DIAGNOSIS — L89151 Pressure ulcer of sacral region, stage 1: Secondary | ICD-10-CM | POA: Diagnosis not present

## 2019-08-27 DIAGNOSIS — R1310 Dysphagia, unspecified: Secondary | ICD-10-CM | POA: Diagnosis not present

## 2019-08-30 DIAGNOSIS — F0280 Dementia in other diseases classified elsewhere without behavioral disturbance: Secondary | ICD-10-CM | POA: Diagnosis not present

## 2019-08-30 DIAGNOSIS — G309 Alzheimer's disease, unspecified: Secondary | ICD-10-CM | POA: Diagnosis not present

## 2019-08-30 DIAGNOSIS — I2510 Atherosclerotic heart disease of native coronary artery without angina pectoris: Secondary | ICD-10-CM | POA: Diagnosis not present

## 2019-08-30 DIAGNOSIS — L89151 Pressure ulcer of sacral region, stage 1: Secondary | ICD-10-CM | POA: Diagnosis not present

## 2019-08-30 DIAGNOSIS — R1310 Dysphagia, unspecified: Secondary | ICD-10-CM | POA: Diagnosis not present

## 2019-08-30 DIAGNOSIS — G9340 Encephalopathy, unspecified: Secondary | ICD-10-CM | POA: Diagnosis not present

## 2019-08-31 DIAGNOSIS — L89151 Pressure ulcer of sacral region, stage 1: Secondary | ICD-10-CM | POA: Diagnosis not present

## 2019-08-31 DIAGNOSIS — R1310 Dysphagia, unspecified: Secondary | ICD-10-CM | POA: Diagnosis not present

## 2019-08-31 DIAGNOSIS — F0280 Dementia in other diseases classified elsewhere without behavioral disturbance: Secondary | ICD-10-CM | POA: Diagnosis not present

## 2019-08-31 DIAGNOSIS — G9340 Encephalopathy, unspecified: Secondary | ICD-10-CM | POA: Diagnosis not present

## 2019-08-31 DIAGNOSIS — I2510 Atherosclerotic heart disease of native coronary artery without angina pectoris: Secondary | ICD-10-CM | POA: Diagnosis not present

## 2019-08-31 DIAGNOSIS — G309 Alzheimer's disease, unspecified: Secondary | ICD-10-CM | POA: Diagnosis not present

## 2019-09-01 DIAGNOSIS — L89151 Pressure ulcer of sacral region, stage 1: Secondary | ICD-10-CM | POA: Diagnosis not present

## 2019-09-01 DIAGNOSIS — R1310 Dysphagia, unspecified: Secondary | ICD-10-CM | POA: Diagnosis not present

## 2019-09-01 DIAGNOSIS — I2510 Atherosclerotic heart disease of native coronary artery without angina pectoris: Secondary | ICD-10-CM | POA: Diagnosis not present

## 2019-09-01 DIAGNOSIS — F0280 Dementia in other diseases classified elsewhere without behavioral disturbance: Secondary | ICD-10-CM | POA: Diagnosis not present

## 2019-09-01 DIAGNOSIS — G309 Alzheimer's disease, unspecified: Secondary | ICD-10-CM | POA: Diagnosis not present

## 2019-09-01 DIAGNOSIS — G9340 Encephalopathy, unspecified: Secondary | ICD-10-CM | POA: Diagnosis not present

## 2019-09-03 ENCOUNTER — Other Ambulatory Visit: Payer: Self-pay | Admitting: Neurology

## 2019-09-03 DIAGNOSIS — G9340 Encephalopathy, unspecified: Secondary | ICD-10-CM | POA: Diagnosis not present

## 2019-09-03 DIAGNOSIS — F03B18 Unspecified dementia, moderate, with other behavioral disturbance: Secondary | ICD-10-CM

## 2019-09-03 DIAGNOSIS — G309 Alzheimer's disease, unspecified: Secondary | ICD-10-CM | POA: Diagnosis not present

## 2019-09-03 DIAGNOSIS — F0391 Unspecified dementia with behavioral disturbance: Secondary | ICD-10-CM

## 2019-09-03 DIAGNOSIS — L89151 Pressure ulcer of sacral region, stage 1: Secondary | ICD-10-CM | POA: Diagnosis not present

## 2019-09-03 DIAGNOSIS — R1310 Dysphagia, unspecified: Secondary | ICD-10-CM | POA: Diagnosis not present

## 2019-09-03 DIAGNOSIS — I2510 Atherosclerotic heart disease of native coronary artery without angina pectoris: Secondary | ICD-10-CM | POA: Diagnosis not present

## 2019-09-03 DIAGNOSIS — F0280 Dementia in other diseases classified elsewhere without behavioral disturbance: Secondary | ICD-10-CM | POA: Diagnosis not present

## 2019-09-05 DIAGNOSIS — R1310 Dysphagia, unspecified: Secondary | ICD-10-CM | POA: Diagnosis not present

## 2019-09-05 DIAGNOSIS — F0280 Dementia in other diseases classified elsewhere without behavioral disturbance: Secondary | ICD-10-CM | POA: Diagnosis not present

## 2019-09-05 DIAGNOSIS — I2510 Atherosclerotic heart disease of native coronary artery without angina pectoris: Secondary | ICD-10-CM | POA: Diagnosis not present

## 2019-09-05 DIAGNOSIS — L89151 Pressure ulcer of sacral region, stage 1: Secondary | ICD-10-CM | POA: Diagnosis not present

## 2019-09-05 DIAGNOSIS — G309 Alzheimer's disease, unspecified: Secondary | ICD-10-CM | POA: Diagnosis not present

## 2019-09-05 DIAGNOSIS — G9340 Encephalopathy, unspecified: Secondary | ICD-10-CM | POA: Diagnosis not present

## 2019-09-06 DIAGNOSIS — I2510 Atherosclerotic heart disease of native coronary artery without angina pectoris: Secondary | ICD-10-CM | POA: Diagnosis not present

## 2019-09-06 DIAGNOSIS — L89151 Pressure ulcer of sacral region, stage 1: Secondary | ICD-10-CM | POA: Diagnosis not present

## 2019-09-06 DIAGNOSIS — F0280 Dementia in other diseases classified elsewhere without behavioral disturbance: Secondary | ICD-10-CM | POA: Diagnosis not present

## 2019-09-06 DIAGNOSIS — R1310 Dysphagia, unspecified: Secondary | ICD-10-CM | POA: Diagnosis not present

## 2019-09-06 DIAGNOSIS — G309 Alzheimer's disease, unspecified: Secondary | ICD-10-CM | POA: Diagnosis not present

## 2019-09-06 DIAGNOSIS — G9340 Encephalopathy, unspecified: Secondary | ICD-10-CM | POA: Diagnosis not present

## 2019-09-07 DIAGNOSIS — I2510 Atherosclerotic heart disease of native coronary artery without angina pectoris: Secondary | ICD-10-CM | POA: Diagnosis not present

## 2019-09-07 DIAGNOSIS — G309 Alzheimer's disease, unspecified: Secondary | ICD-10-CM | POA: Diagnosis not present

## 2019-09-07 DIAGNOSIS — F0280 Dementia in other diseases classified elsewhere without behavioral disturbance: Secondary | ICD-10-CM | POA: Diagnosis not present

## 2019-09-07 DIAGNOSIS — G9340 Encephalopathy, unspecified: Secondary | ICD-10-CM | POA: Diagnosis not present

## 2019-09-07 DIAGNOSIS — L89151 Pressure ulcer of sacral region, stage 1: Secondary | ICD-10-CM | POA: Diagnosis not present

## 2019-09-07 DIAGNOSIS — R1310 Dysphagia, unspecified: Secondary | ICD-10-CM | POA: Diagnosis not present

## 2019-09-08 DIAGNOSIS — I2510 Atherosclerotic heart disease of native coronary artery without angina pectoris: Secondary | ICD-10-CM | POA: Diagnosis not present

## 2019-09-08 DIAGNOSIS — G9340 Encephalopathy, unspecified: Secondary | ICD-10-CM | POA: Diagnosis not present

## 2019-09-08 DIAGNOSIS — R1310 Dysphagia, unspecified: Secondary | ICD-10-CM | POA: Diagnosis not present

## 2019-09-08 DIAGNOSIS — L89151 Pressure ulcer of sacral region, stage 1: Secondary | ICD-10-CM | POA: Diagnosis not present

## 2019-09-08 DIAGNOSIS — F0280 Dementia in other diseases classified elsewhere without behavioral disturbance: Secondary | ICD-10-CM | POA: Diagnosis not present

## 2019-09-08 DIAGNOSIS — G309 Alzheimer's disease, unspecified: Secondary | ICD-10-CM | POA: Diagnosis not present

## 2019-09-09 ENCOUNTER — Other Ambulatory Visit: Payer: Self-pay | Admitting: Internal Medicine

## 2019-09-09 DIAGNOSIS — G9340 Encephalopathy, unspecified: Secondary | ICD-10-CM | POA: Diagnosis not present

## 2019-09-09 DIAGNOSIS — R1310 Dysphagia, unspecified: Secondary | ICD-10-CM | POA: Diagnosis not present

## 2019-09-09 DIAGNOSIS — L89151 Pressure ulcer of sacral region, stage 1: Secondary | ICD-10-CM | POA: Diagnosis not present

## 2019-09-09 DIAGNOSIS — I2510 Atherosclerotic heart disease of native coronary artery without angina pectoris: Secondary | ICD-10-CM | POA: Diagnosis not present

## 2019-09-09 DIAGNOSIS — F0280 Dementia in other diseases classified elsewhere without behavioral disturbance: Secondary | ICD-10-CM | POA: Diagnosis not present

## 2019-09-09 DIAGNOSIS — G309 Alzheimer's disease, unspecified: Secondary | ICD-10-CM | POA: Diagnosis not present

## 2019-09-10 DIAGNOSIS — I2510 Atherosclerotic heart disease of native coronary artery without angina pectoris: Secondary | ICD-10-CM | POA: Diagnosis not present

## 2019-09-10 DIAGNOSIS — G309 Alzheimer's disease, unspecified: Secondary | ICD-10-CM | POA: Diagnosis not present

## 2019-09-10 DIAGNOSIS — F0280 Dementia in other diseases classified elsewhere without behavioral disturbance: Secondary | ICD-10-CM | POA: Diagnosis not present

## 2019-09-10 DIAGNOSIS — R1310 Dysphagia, unspecified: Secondary | ICD-10-CM | POA: Diagnosis not present

## 2019-09-10 DIAGNOSIS — G9340 Encephalopathy, unspecified: Secondary | ICD-10-CM | POA: Diagnosis not present

## 2019-09-10 DIAGNOSIS — L89151 Pressure ulcer of sacral region, stage 1: Secondary | ICD-10-CM | POA: Diagnosis not present

## 2019-09-11 DIAGNOSIS — L89151 Pressure ulcer of sacral region, stage 1: Secondary | ICD-10-CM | POA: Diagnosis not present

## 2019-09-11 DIAGNOSIS — F0280 Dementia in other diseases classified elsewhere without behavioral disturbance: Secondary | ICD-10-CM | POA: Diagnosis not present

## 2019-09-11 DIAGNOSIS — G9340 Encephalopathy, unspecified: Secondary | ICD-10-CM | POA: Diagnosis not present

## 2019-09-11 DIAGNOSIS — G309 Alzheimer's disease, unspecified: Secondary | ICD-10-CM | POA: Diagnosis not present

## 2019-09-11 DIAGNOSIS — I2510 Atherosclerotic heart disease of native coronary artery without angina pectoris: Secondary | ICD-10-CM | POA: Diagnosis not present

## 2019-09-11 DIAGNOSIS — R1310 Dysphagia, unspecified: Secondary | ICD-10-CM | POA: Diagnosis not present

## 2019-09-12 DIAGNOSIS — G309 Alzheimer's disease, unspecified: Secondary | ICD-10-CM | POA: Diagnosis not present

## 2019-09-12 DIAGNOSIS — E039 Hypothyroidism, unspecified: Secondary | ICD-10-CM | POA: Diagnosis not present

## 2019-09-12 DIAGNOSIS — E1159 Type 2 diabetes mellitus with other circulatory complications: Secondary | ICD-10-CM | POA: Diagnosis not present

## 2019-09-12 DIAGNOSIS — I2510 Atherosclerotic heart disease of native coronary artery without angina pectoris: Secondary | ICD-10-CM | POA: Diagnosis not present

## 2019-09-12 DIAGNOSIS — F0280 Dementia in other diseases classified elsewhere without behavioral disturbance: Secondary | ICD-10-CM | POA: Diagnosis not present

## 2019-09-12 DIAGNOSIS — K219 Gastro-esophageal reflux disease without esophagitis: Secondary | ICD-10-CM | POA: Diagnosis not present

## 2019-09-12 DIAGNOSIS — R1310 Dysphagia, unspecified: Secondary | ICD-10-CM | POA: Diagnosis not present

## 2019-09-12 DIAGNOSIS — E785 Hyperlipidemia, unspecified: Secondary | ICD-10-CM | POA: Diagnosis not present

## 2019-09-12 DIAGNOSIS — L89151 Pressure ulcer of sacral region, stage 1: Secondary | ICD-10-CM | POA: Diagnosis not present

## 2019-09-12 DIAGNOSIS — G9340 Encephalopathy, unspecified: Secondary | ICD-10-CM | POA: Diagnosis not present

## 2019-09-12 DIAGNOSIS — I482 Chronic atrial fibrillation, unspecified: Secondary | ICD-10-CM | POA: Diagnosis not present

## 2019-09-12 DIAGNOSIS — I10 Essential (primary) hypertension: Secondary | ICD-10-CM | POA: Diagnosis not present

## 2019-09-12 DIAGNOSIS — Z8673 Personal history of transient ischemic attack (TIA), and cerebral infarction without residual deficits: Secondary | ICD-10-CM | POA: Diagnosis not present

## 2019-09-13 ENCOUNTER — Telehealth: Payer: Self-pay | Admitting: Internal Medicine

## 2019-09-13 DIAGNOSIS — G309 Alzheimer's disease, unspecified: Secondary | ICD-10-CM | POA: Diagnosis not present

## 2019-09-13 DIAGNOSIS — R1310 Dysphagia, unspecified: Secondary | ICD-10-CM | POA: Diagnosis not present

## 2019-09-13 DIAGNOSIS — L89151 Pressure ulcer of sacral region, stage 1: Secondary | ICD-10-CM | POA: Diagnosis not present

## 2019-09-13 DIAGNOSIS — G9340 Encephalopathy, unspecified: Secondary | ICD-10-CM | POA: Diagnosis not present

## 2019-09-13 DIAGNOSIS — F0280 Dementia in other diseases classified elsewhere without behavioral disturbance: Secondary | ICD-10-CM | POA: Diagnosis not present

## 2019-09-13 DIAGNOSIS — I2510 Atherosclerotic heart disease of native coronary artery without angina pectoris: Secondary | ICD-10-CM | POA: Diagnosis not present

## 2019-09-13 NOTE — Telephone Encounter (Signed)
Copied from [place] [id number]. Topic: General - Other >> Sep 13, 2019  2:17 PM Keene Breath wrote: Reason for CRM: Called to speak the nurse, Lovena Le, regarding some orders that were faxed over last week and in September on two patients.  Please call to discuss at [phone]

## 2019-09-14 NOTE — Telephone Encounter (Signed)
Spoke with [employer]. I received the orders and will have Dr. Quay Burow sign and fax back.

## 2019-09-14 NOTE — Telephone Encounter (Signed)
Shivon with Hospice called again requesting CB from [facility] as soon as possible regarding orders for pt that are from September. She is refaxing orders and is requesting CB from [facility] or Almyra Free, Engineer, building services. No answer on FC line x3

## 2019-09-15 DIAGNOSIS — G309 Alzheimer's disease, unspecified: Secondary | ICD-10-CM | POA: Diagnosis not present

## 2019-09-15 DIAGNOSIS — G9340 Encephalopathy, unspecified: Secondary | ICD-10-CM | POA: Diagnosis not present

## 2019-09-15 DIAGNOSIS — R1310 Dysphagia, unspecified: Secondary | ICD-10-CM | POA: Diagnosis not present

## 2019-09-15 DIAGNOSIS — L89151 Pressure ulcer of sacral region, stage 1: Secondary | ICD-10-CM | POA: Diagnosis not present

## 2019-09-15 DIAGNOSIS — F0280 Dementia in other diseases classified elsewhere without behavioral disturbance: Secondary | ICD-10-CM | POA: Diagnosis not present

## 2019-09-15 DIAGNOSIS — I2510 Atherosclerotic heart disease of native coronary artery without angina pectoris: Secondary | ICD-10-CM | POA: Diagnosis not present

## 2019-09-16 ENCOUNTER — Other Ambulatory Visit: Payer: Self-pay | Admitting: Internal Medicine

## 2019-09-16 DIAGNOSIS — G309 Alzheimer's disease, unspecified: Secondary | ICD-10-CM | POA: Diagnosis not present

## 2019-09-16 DIAGNOSIS — G9340 Encephalopathy, unspecified: Secondary | ICD-10-CM | POA: Diagnosis not present

## 2019-09-16 DIAGNOSIS — F0280 Dementia in other diseases classified elsewhere without behavioral disturbance: Secondary | ICD-10-CM | POA: Diagnosis not present

## 2019-09-16 DIAGNOSIS — L89151 Pressure ulcer of sacral region, stage 1: Secondary | ICD-10-CM | POA: Diagnosis not present

## 2019-09-16 DIAGNOSIS — R1310 Dysphagia, unspecified: Secondary | ICD-10-CM | POA: Diagnosis not present

## 2019-09-16 DIAGNOSIS — I2510 Atherosclerotic heart disease of native coronary artery without angina pectoris: Secondary | ICD-10-CM | POA: Diagnosis not present

## 2019-09-20 ENCOUNTER — Other Ambulatory Visit: Payer: Self-pay

## 2019-09-20 MED ORDER — ONETOUCH ULTRA VI STRP: ORAL_STRIP | 2 refills | Status: AC

## 2019-09-27 ENCOUNTER — Telehealth: Payer: Self-pay

## 2019-09-27 NOTE — Telephone Encounter (Signed)
Received signed dc back from [facility]. I called the funeral home to let them know the dc is ready for pickup.

## 2019-10-12 DEATH — deceased

## 2019-10-13 ENCOUNTER — Ambulatory Visit: Payer: Medicare Other | Admitting: Neurology
